# Patient Record
Sex: Male | Born: 1980 | Race: White | Hispanic: No | Marital: Single | Smoking: Former smoker
Health system: Southern US, Community
[De-identification: ages and names within clinical notes are randomized; demographics above are authoritative.]

## PROBLEM LIST (undated history)

## (undated) DIAGNOSIS — I4891 Unspecified atrial fibrillation: Secondary | ICD-10-CM

## (undated) DIAGNOSIS — M5126 Other intervertebral disc displacement, lumbar region: Secondary | ICD-10-CM

## (undated) HISTORY — DX: Unspecified atrial fibrillation: I48.91

## (undated) HISTORY — PX: AORTIC/RENAL BYPASS: SHX6299

---

## 2003-07-31 ENCOUNTER — Emergency Department (HOSPITAL_COMMUNITY): Admission: EM | Admit: 2003-07-31 | Discharge: 2003-07-31 | Payer: Self-pay | Admitting: *Deleted

## 2007-05-14 ENCOUNTER — Ambulatory Visit (HOSPITAL_COMMUNITY): Admission: RE | Admit: 2007-05-14 | Discharge: 2007-05-14 | Payer: Self-pay | Admitting: Family Medicine

## 2012-03-09 ENCOUNTER — Other Ambulatory Visit (HOSPITAL_COMMUNITY): Payer: Self-pay | Admitting: Physician Assistant

## 2012-03-09 DIAGNOSIS — L03221 Cellulitis of neck: Secondary | ICD-10-CM

## 2012-03-09 DIAGNOSIS — R6889 Other general symptoms and signs: Secondary | ICD-10-CM

## 2012-03-10 ENCOUNTER — Emergency Department (HOSPITAL_COMMUNITY): Payer: Self-pay

## 2012-03-10 ENCOUNTER — Emergency Department (HOSPITAL_COMMUNITY)
Admission: EM | Admit: 2012-03-10 | Discharge: 2012-03-10 | Disposition: A | Discharge status: Home / Self Care | Payer: Self-pay | Attending: Emergency Medicine | Admitting: Emergency Medicine

## 2012-03-10 ENCOUNTER — Other Ambulatory Visit (HOSPITAL_COMMUNITY): Payer: Self-pay | Admitting: Physician Assistant

## 2012-03-10 ENCOUNTER — Encounter (HOSPITAL_COMMUNITY): Payer: Self-pay

## 2012-03-10 ENCOUNTER — Ambulatory Visit (HOSPITAL_COMMUNITY): Payer: Self-pay

## 2012-03-10 DIAGNOSIS — R61 Generalized hyperhidrosis: Secondary | ICD-10-CM | POA: Insufficient documentation

## 2012-03-10 DIAGNOSIS — F17200 Nicotine dependence, unspecified, uncomplicated: Secondary | ICD-10-CM | POA: Insufficient documentation

## 2012-03-10 DIAGNOSIS — R6889 Other general symptoms and signs: Secondary | ICD-10-CM

## 2012-03-10 DIAGNOSIS — L0211 Cutaneous abscess of neck: Secondary | ICD-10-CM

## 2012-03-10 DIAGNOSIS — M542 Cervicalgia: Secondary | ICD-10-CM | POA: Insufficient documentation

## 2012-03-10 DIAGNOSIS — R590 Localized enlarged lymph nodes: Secondary | ICD-10-CM

## 2012-03-10 DIAGNOSIS — R599 Enlarged lymph nodes, unspecified: Secondary | ICD-10-CM | POA: Insufficient documentation

## 2012-03-10 LAB — BASIC METABOLIC PANEL
Calcium: 9.8 mg/dL (ref 8.4–10.5)
Creatinine, Ser: 0.84 mg/dL (ref 0.50–1.35)
GFR calc non Af Amer: >90 mL/min (ref >90)
Glucose, Bld: 88 mg/dL (ref 70–99)
Sodium: 139 mEq/L (ref 135–145)

## 2012-03-10 LAB — CBC WITH DIFFERENTIAL/PLATELET
Basophils Absolute: 0 10*3/uL (ref 0.0–0.1)
Eosinophils Absolute: 0.2 10*3/uL (ref 0.0–0.7)
Eosinophils Relative: 1 % (ref 0–5)
HCT: 46.5 % (ref 39.0–52.0)
MCH: 30.9 pg (ref 26.0–34.0)
MCV: 89.8 fL (ref 78.0–100.0)
Monocytes Absolute: 1.5 10*3/uL — ABNORMAL HIGH (ref 0.1–1.0)
Platelets: 263 10*3/uL (ref 150–400)
RDW: 12.8 % (ref 11.5–15.5)

## 2012-03-10 MED ORDER — OXYCODONE-ACETAMINOPHEN 5-325 MG PO TABS: 1.0000 | ORAL_TABLET | ORAL | Status: AC | PRN

## 2012-03-10 MED ORDER — PREDNISONE 50 MG PO TABS
60.0000 mg | ORAL_TABLET | Freq: Once | ORAL | Status: AC
  Administered 2012-03-10: 60 mg via ORAL

## 2012-03-10 MED ORDER — PREDNISONE 10 MG PO TABS
ORAL_TABLET | ORAL | Status: AC
  Filled 2012-03-10: qty 1

## 2012-03-10 MED ORDER — PREDNISONE 20 MG PO TABS: 60.0000 mg | ORAL_TABLET | Freq: Every day | ORAL | Status: DC

## 2012-03-10 MED ORDER — IOHEXOL 300 MG/ML  SOLN
75.0000 mL | Freq: Once | INTRAMUSCULAR | Status: AC | PRN
  Administered 2012-03-10: 75 mL via INTRAVENOUS

## 2012-03-10 MED ORDER — HYDROMORPHONE HCL PF 1 MG/ML IJ SOLN
1.0000 mg | Freq: Once | INTRAMUSCULAR | Status: AC
  Administered 2012-03-10: 1 mg via INTRAVENOUS
  Filled 2012-03-10: qty 1

## 2012-03-10 MED ORDER — ONDANSETRON HCL 4 MG/2ML IJ SOLN
4.0000 mg | Freq: Once | INTRAMUSCULAR | Status: AC
  Administered 2012-03-10: 4 mg via INTRAVENOUS
  Filled 2012-03-10: qty 2

## 2012-03-10 MED ORDER — PREDNISONE 50 MG PO TABS
ORAL_TABLET | ORAL | Status: AC
  Filled 2012-03-10: qty 1

## 2012-03-10 NOTE — ED Provider Notes (Signed)
History   This chart was scribed for Delora Fuel, MD, by Joline Maxcy, ER scribe. The patient was seen in room APFT22/APFT22 and the patient's care was started at 1756.    CSN: [id number]  Arrival date & time 03/10/12  1739   First MD Initiated Contact with Patient 03/10/12 1756      Chief Complaint  Patient presents with  . Facial Pain    (Consider location/radiation/quality/duration/timing/severity/associated sxs/prior treatment) HPI Comments: Alex [name] is a 31 y.o. male who presents to the Emergency Department complaining of gradually worsening swelling to the left neck with associated 8/10 pain and diaphoresis that began last week. He denies any chills or fevers. He states that the pain is aggravated by movement and moving his jaw. He is a current, every day, pack a day smoker who does not drink ETOH. His mother reports that he was experiencing a sore throat and difficulty swallowing symptoms last week, but they have since resolved. He states that he saw his PCP and was prescribed doxycyline and Augmentin. He states that he has been taking oxycodone 5 mg at home with minimal relief.    PCP is Dr. Gerarda Fraction.      History reviewed. No pertinent past medical history.  History reviewed. No pertinent past surgical history.  No family history on file.  History  Substance Use Topics  . Smoking status: Current Every Day Smoker  . Smokeless tobacco: Not on file  . Alcohol Use: No      Review of Systems  HENT: Positive for facial swelling and neck pain.   All other systems reviewed and are negative.    Allergies  Review of patient's allergies indicates no known allergies.  Home Medications   Current Outpatient Rx  Name  Route  Sig  Dispense  Refill  . AMOXICILLIN-POT CLAVULANATE 875-125 MG PO TABS   Oral   Take 1 tablet by mouth 2 (two) times daily.         Marland Kitchen DOXYCYCLINE HYCLATE 100 MG PO TBEC   Oral   Take 100 mg by mouth 2 (two) times daily.          . OXYCODONE-ACETAMINOPHEN 5-325 MG PO TABS   Oral   Take 1 tablet by mouth every 6 (six) hours as needed. For pain         . PHENYLEPHRINE-DM-GG 5-10-100 MG/5ML PO LIQD   Oral   Take by mouth daily as needed. For cough           BP 143/85  Pulse 96  Temp 98 F (36.7 C) (Oral)  Resp 21  Ht 6\' 3"  (1.905 m)  Wt 299 lb 4 oz (135.739 kg)  BMI 37.40 kg/m2  SpO2 100%  Physical Exam  Nursing note and vitals reviewed. Constitutional: He is oriented to person, place, and time. He appears well-developed and well-nourished. No distress.  HENT:  Head: Normocephalic and atraumatic.  Eyes: EOM are normal. Pupils are equal, round, and reactive to light.  Neck: Normal range of motion. Neck supple. No tracheal deviation present.       He has an indurated tender mass occupying the post-triangle that extends to the post-auricular area and measures 8 by 9 cm.  Cardiovascular: Normal rate.   Pulmonary/Chest: Effort normal. No respiratory distress.  Abdominal: Soft. He exhibits no distension.  Musculoskeletal: Normal range of motion. He exhibits no edema.  Neurological: He is alert and oriented to person, place, and time.  Skin: Skin is warm and dry.  Psychiatric: He has a normal mood and affect. His behavior is normal.    ED Course  Procedures (including critical care time)  DIAGNOSTIC STUDIES: Oxygen Saturation is 100% on room air, normal by my interpretation.    COORDINATION OF CARE:  18:03- Discussed planned course of treatment with the patient, including a neck CT and blood work, who is agreeable at this time.  18:15- Medication Orders- hydromorphone (Dilaudid) injection 1 mg- Once, ondansetron (Zofran) injection 4 mg- Once.  19:14- Medication Orders- iohexol (Omnipaque) 300 mg/ml solution 75 mL- Once.  20:32- Recheck- Discussed scan results. Shared that the antibiotics are the right medications to the treat the condition as well as a course of steroids. Will give Dr. Verlan Friends  information to follow up with ENT.   Results for orders placed during the hospital encounter of 03/10/12  CBC WITH DIFFERENTIAL      Component Value Range   WBC 16.8 (*) 4.0 - 10.5 K/uL   RBC 5.18  4.22 - 5.81 MIL/uL   Hemoglobin 16.0  13.0 - 17.0 g/dL   HCT 46.5  39.0 - 52.0 %   MCV 89.8  78.0 - 100.0 fL   MCH 30.9  26.0 - 34.0 pg   MCHC 34.4  30.0 - 36.0 g/dL   RDW 12.8  11.5 - 15.5 %   Platelets 263  150 - 400 K/uL   Neutrophils Relative 64  43 - 77 %   Neutro Abs 10.8 (*) 1.7 - 7.7 K/uL   Lymphocytes Relative 25  12 - 46 %   Lymphs Abs 4.3 (*) 0.7 - 4.0 K/uL   Monocytes Relative 9  3 - 12 %   Monocytes Absolute 1.5 (*) 0.1 - 1.0 K/uL   Eosinophils Relative 1  0 - 5 %   Eosinophils Absolute 0.2  0.0 - 0.7 K/uL   Basophils Relative 0  0 - 1 %   Basophils Absolute 0.0  0.0 - 0.1 K/uL  BASIC METABOLIC PANEL      Component Value Range   Sodium 139  135 - 145 mEq/L   Potassium 4.1  3.5 - 5.1 mEq/L   Chloride 98  96 - 112 mEq/L   CO2 32  19 - 32 mEq/L   Glucose, Bld 88  70 - 99 mg/dL   BUN 10  6 - 23 mg/dL   Creatinine, Ser 0.84  0.50 - 1.35 mg/dL   Calcium 9.8  8.4 - 10.5 mg/dL   GFR calc non Af Amer >90  >90 mL/min   GFR calc Af Amer >90  >90 mL/min   Ct Soft Tissue Neck W Contrast  03/10/2012  *RADIOLOGY REPORT*  Clinical Data: Left neck swelling/pain  CT NECK WITH CONTRAST  Technique:  Multidetector CT imaging of the neck was performed with intravenous contrast.  Contrast: 68mL OMNIPAQUE IOHEXOL 300 MG/ML  SOLN  Comparison: None.  Findings: 1.8 x 1.4 cm centrally necrotic left level IIB node with surrounding inflammatory stranding (series 2/image 33).  Additional bilateral anterior cervical nodes including a 13 mm short-axis left level IIA node (series 2/image 44).  Fullness of the left tonsillar pillar relative to the right (series 2/image 34), without underlying drainable fluid collection/abscess.  The pharynx remains patent.  The visualized paranasal sinuses are  essentially clear. The mastoid air cells are unopacified.  Visualized brain parenchyma is unremarkable.  The cervical spine is normal.  IMPRESSION: Fullness of the left tonsillar pillar.  Correlate with direct inspection.  No evidence of peritonsillar abscess.  Associated left cervical lymphadenopathy including a 1.8 x 1.4 cm centrally necrotic left level IIB node.  Given the patient's age, these findings are favored to be infectious/inflammatory, although neoplasm is not excluded. Clinical follow-up to resolution is suggested.   Original Report Authenticated By: Julian Hy, M.D.       1. Cervical lymphadenopathy       MDM   Neck swelling worrisome for abscess. CT will be obtained to characterize the lesion.  CT shows evidence of enlarged lymph nodes with a necrotic lymph node-presumably inflammatory. He has just started on antibiotics and should continue taking them. He is given a prescription for prednisone and Percocet and he is referred to ENT for followup. Importance of followup was stressed to patient and his mother who both expressed understanding.   I personally performed the services described in this documentation, which was scribed in my presence. The recorded information has been reviewed and is accurate.           Delora Fuel, MD [id number]

## 2012-03-10 NOTE — ED Notes (Signed)
Complain of pain in swelling to left side of neck

## 2012-03-10 NOTE — ED Notes (Signed)
Patient with no complaints at this time. Respirations even and unlabored. Skin warm/dry. Discharge instructions reviewed with patient at this time. Patient given opportunity to voice concerns/ask questions. IV removed per policy and band-aid applied to site. Patient discharged at this time and left Emergency Department with steady gait.  

## 2012-03-11 ENCOUNTER — Ambulatory Visit (HOSPITAL_COMMUNITY): Admission: RE | Admit: 2012-03-11 | Payer: Self-pay | Source: Ambulatory Visit

## 2012-03-19 ENCOUNTER — Other Ambulatory Visit (HOSPITAL_COMMUNITY): Payer: Self-pay

## 2012-09-09 ENCOUNTER — Encounter (HOSPITAL_COMMUNITY): Payer: Self-pay | Admitting: *Deleted

## 2012-09-09 ENCOUNTER — Emergency Department (HOSPITAL_COMMUNITY)
Admission: EM | Admit: 2012-09-09 | Discharge: 2012-09-09 | Disposition: A | Discharge status: Home / Self Care | Payer: Self-pay | Attending: Emergency Medicine | Admitting: Emergency Medicine

## 2012-09-09 DIAGNOSIS — K089 Disorder of teeth and supporting structures, unspecified: Secondary | ICD-10-CM | POA: Insufficient documentation

## 2012-09-09 DIAGNOSIS — F17200 Nicotine dependence, unspecified, uncomplicated: Secondary | ICD-10-CM | POA: Insufficient documentation

## 2012-09-09 DIAGNOSIS — K0889 Other specified disorders of teeth and supporting structures: Secondary | ICD-10-CM

## 2012-09-09 DIAGNOSIS — R220 Localized swelling, mass and lump, head: Secondary | ICD-10-CM | POA: Insufficient documentation

## 2012-09-09 MED ORDER — HYDROCODONE-ACETAMINOPHEN 5-325 MG PO TABS: 2.0000 | ORAL_TABLET | ORAL | Status: DC | PRN

## 2012-09-09 MED ORDER — PENICILLIN V POTASSIUM 500 MG PO TABS: 500.0000 mg | ORAL_TABLET | Freq: Four times a day (QID) | ORAL | Status: AC

## 2012-09-09 NOTE — ED Notes (Signed)
Pt alert & oriented x4, stable gait. Patient given discharge instructions, paperwork & prescription(s). Patient  instructed to stop at the registration desk to finish any additional paperwork. Patient verbalized understanding. Pt left department w/ no further questions. 

## 2012-09-09 NOTE — ED Provider Notes (Signed)
Medical screening examination/treatment/procedure(s) were performed by non-physician practitioner and as supervising physician I was immediately available for consultation/collaboration.   Ezequiel Essex, MD 09/09/12 2340

## 2012-09-09 NOTE — ED Notes (Signed)
Dental pain for 2 days , says he broke 2 molars

## 2012-09-09 NOTE — ED Provider Notes (Signed)
History     CSN: [id number]  Arrival date & time 09/09/12  1800   First MD Initiated Contact with Patient 09/09/12 1844      Chief Complaint  Patient presents with  . Dental Pain    (Consider location/radiation/quality/duration/timing/severity/associated sxs/prior treatment) Patient is a 32 y.o. male presenting with tooth pain. The history is provided by the patient. No language interpreter was used.  Dental Pain Location:  Lower Lower teeth location:  18/LL 2nd molar and 19/LL 1st molar Quality:  Shooting Severity:  Moderate Timing:  Constant Progression:  Worsening Chronicity:  New Relieved by:  Nothing Worsened by:  Nothing tried Associated symptoms: gum swelling     History reviewed. No pertinent past medical history.  History reviewed. No pertinent past surgical history.  History reviewed. No pertinent family history.  History  Substance Use Topics  . Smoking status: Current Every Day Smoker  . Smokeless tobacco: Not on file  . Alcohol Use: No      Review of Systems  HENT: Positive for dental problem.   All other systems reviewed and are negative.    Allergies  Review of patient's allergies indicates no known allergies.  Home Medications  No current outpatient prescriptions on file.  BP 147/98  Pulse 79  Temp(Src) 98.5 F (36.9 C) (Oral)  Resp 18  Ht 6\' 3"  (1.905 m)  Wt 300 lb (136.079 kg)  BMI 37.5 kg/m2  SpO2 100%  Physical Exam  Nursing note and vitals reviewed. Constitutional: He appears well-developed and well-nourished.  HENT:  Head: Normocephalic and atraumatic.  Mouth/Throat: Oropharynx is clear and moist.  Swollen gum line,  Decayed brown tooth  Eyes: Pupils are equal, round, and reactive to light.  Neck: Normal range of motion.  Cardiovascular: Normal rate.   Pulmonary/Chest: Effort normal.  Musculoskeletal: Normal range of motion.  Neurological: He is alert.  Skin: Skin is warm.    ED Course  Procedures (including  critical care time)  Labs Reviewed - No data to display No results found.   1. Toothache       MDM  pcn and hydrocodone         [place], PA-C 09/09/12 [place], PA-C 09/09/12 1901

## 2012-10-12 ENCOUNTER — Encounter (HOSPITAL_COMMUNITY): Payer: Self-pay | Admitting: *Deleted

## 2012-10-12 ENCOUNTER — Emergency Department (HOSPITAL_COMMUNITY)
Admission: EM | Admit: 2012-10-12 | Discharge: 2012-10-12 | Disposition: A | Discharge status: Home / Self Care | Payer: Self-pay | Attending: Emergency Medicine | Admitting: Emergency Medicine

## 2012-10-12 DIAGNOSIS — K089 Disorder of teeth and supporting structures, unspecified: Secondary | ICD-10-CM | POA: Insufficient documentation

## 2012-10-12 DIAGNOSIS — K1370 Unspecified lesions of oral mucosa: Secondary | ICD-10-CM | POA: Insufficient documentation

## 2012-10-12 DIAGNOSIS — K0889 Other specified disorders of teeth and supporting structures: Secondary | ICD-10-CM

## 2012-10-12 DIAGNOSIS — F17200 Nicotine dependence, unspecified, uncomplicated: Secondary | ICD-10-CM | POA: Insufficient documentation

## 2012-10-12 DIAGNOSIS — R51 Headache: Secondary | ICD-10-CM | POA: Insufficient documentation

## 2012-10-12 MED ORDER — AMOXICILLIN 500 MG PO CAPS: 500.0000 mg | ORAL_CAPSULE | Freq: Three times a day (TID) | ORAL | Status: DC

## 2012-10-12 MED ORDER — TRAMADOL HCL 50 MG PO TABS: 50.0000 mg | ORAL_TABLET | Freq: Four times a day (QID) | ORAL | Status: DC | PRN

## 2012-10-12 NOTE — ED Notes (Signed)
Mult dental caries. Pain both sides of face.

## 2012-10-12 NOTE — ED Provider Notes (Signed)
History    CSN: [id number] Arrival date & time 10/12/12  [id number]  First MD Initiated Contact with Patient 10/12/12 1913     Chief Complaint  Patient presents with  . Dental Problem   (Consider location/radiation/quality/duration/timing/severity/associated sxs/prior Treatment) Patient is a 32 y.o. male presenting with tooth pain. The history is provided by the patient.  Dental Pain Location:  Upper and lower Upper teeth location:  15/LU 2nd molar Lower teeth location:  32/RL 3rd molar Quality:  Sharp and throbbing Severity:  Moderate Onset quality:  Gradual Timing:  Constant Progression:  Worsening Chronicity:  Recurrent Context: dental caries and poor dentition   Context: not abscess, not malocclusion and not trauma   Prior workup: none. Relieved by:  Nothing Worsened by:  Cold food/drink, hot food/drink and touching Ineffective treatments:  NSAIDs and acetaminophen Associated symptoms: facial pain and oral bleeding   Associated symptoms: no congestion, no difficulty swallowing, no drooling, no facial swelling, no fever, no gum swelling, no headaches, no neck pain, no neck swelling, no oral lesions and no trismus   Associated symptoms comment:  Intermittent bleeding of the right lower gums with brushing Risk factors: lack of dental care, periodontal disease and smoking    History reviewed. No pertinent past medical history. History reviewed. No pertinent past surgical history. No family history on file. History  Substance Use Topics  . Smoking status: Current Every Day Smoker  . Smokeless tobacco: Not on file  . Alcohol Use: No    Review of Systems  Constitutional: Negative for fever and appetite change.  HENT: Positive for dental problem. Negative for congestion, sore throat, facial swelling, drooling, mouth sores, trouble swallowing, neck pain and neck stiffness.   Eyes: Negative for pain and visual disturbance.  Neurological: Negative for dizziness, facial asymmetry  and headaches.  Hematological: Negative for adenopathy.  All other systems reviewed and are negative.    Allergies  Review of patient's allergies indicates no known allergies.  Home Medications   Current Outpatient Rx  Name  Route  Sig  Dispense  Refill  . HYDROcodone-acetaminophen (NORCO/VICODIN) 5-325 MG per tablet   Oral   Take 2 tablets by mouth every 4 (four) hours as needed for pain.   20 tablet   0    BP 135/82  Pulse 81  Temp(Src) 98.6 F (37 C) (Oral)  Resp 20  Ht 6\' 3"  (1.905 m)  Wt 300 lb (136.079 kg)  BMI 37.5 kg/m2  SpO2 100% Physical Exam  Nursing note and vitals reviewed. Constitutional: He is oriented to person, place, and time. He appears well-developed and well-nourished. No distress.  HENT:  Head: Normocephalic and atraumatic. No trismus in the jaw.  Right Ear: Tympanic membrane and ear canal normal.  Left Ear: Tympanic membrane and ear canal normal.  Mouth/Throat: Uvula is midline, oropharynx is clear and moist and mucous membranes are normal. Dental caries present. No dental abscesses or edematous.    ttp of the upper left second molar and right lower third molar.  No facial edema, trismus, or abscess  Neck: Normal range of motion. Neck supple.  Cardiovascular: Normal rate, regular rhythm and normal heart sounds.   No murmur heard. Pulmonary/Chest: Effort normal and breath sounds normal.  Musculoskeletal: Normal range of motion.  Lymphadenopathy:    He has no cervical adenopathy.  Neurological: He is alert and oriented to person, place, and time. He exhibits normal muscle tone. Coordination normal.  Skin: Skin is warm and dry.    ED  Course  Procedures (including critical care time) Labs Reviewed - No data to display   MDM     Dental pain with multiple dental caries.  No facial edema, trismus or obvious dental abscess.  Referral info given for Dr. Donn Pierini.    Avanish Cerullo L. Vanessa Mantua, PA-C 10/12/12 1944

## 2012-10-12 NOTE — ED Provider Notes (Signed)
Medical screening examination/treatment/procedure(s) were performed by non-physician practitioner and as supervising physician I was immediately available for consultation/collaboration.   Hoy Morn, MD 10/12/12 1949

## 2012-10-12 NOTE — ED Notes (Signed)
Pt reprots having some chipped teeth, has tried to see dentist but has no insurance & was unable to be seen.

## 2013-03-14 ENCOUNTER — Encounter (HOSPITAL_COMMUNITY): Payer: Self-pay | Admitting: Emergency Medicine

## 2013-03-14 ENCOUNTER — Emergency Department (HOSPITAL_COMMUNITY)
Admission: EM | Admit: 2013-03-14 | Discharge: 2013-03-14 | Disposition: A | Discharge status: Home / Self Care | Payer: BC Managed Care – PPO | Attending: Emergency Medicine | Admitting: Emergency Medicine

## 2013-03-14 DIAGNOSIS — F17200 Nicotine dependence, unspecified, uncomplicated: Secondary | ICD-10-CM | POA: Insufficient documentation

## 2013-03-14 DIAGNOSIS — M5432 Sciatica, left side: Secondary | ICD-10-CM

## 2013-03-14 DIAGNOSIS — Z8781 Personal history of (healed) traumatic fracture: Secondary | ICD-10-CM | POA: Insufficient documentation

## 2013-03-14 DIAGNOSIS — M5430 Sciatica, unspecified side: Secondary | ICD-10-CM | POA: Insufficient documentation

## 2013-03-14 MED ORDER — OXYCODONE-ACETAMINOPHEN 5-325 MG PO TABS
1.0000 | ORAL_TABLET | Freq: Once | ORAL | Status: AC
  Administered 2013-03-14: 1 via ORAL
  Filled 2013-03-14: qty 1

## 2013-03-14 MED ORDER — CYCLOBENZAPRINE HCL 10 MG PO TABS: 10.0000 mg | ORAL_TABLET | Freq: Three times a day (TID) | ORAL | Status: DC

## 2013-03-14 MED ORDER — PREDNISONE 50 MG PO TABS
60.0000 mg | ORAL_TABLET | Freq: Once | ORAL | Status: AC
  Administered 2013-03-14: 60 mg via ORAL
  Filled 2013-03-14 (×2): qty 1

## 2013-03-14 MED ORDER — OXYCODONE-ACETAMINOPHEN 5-325 MG PO TABS: 1.0000 | ORAL_TABLET | ORAL | Status: DC | PRN

## 2013-03-14 MED ORDER — PREDNISONE 10 MG PO TABS: ORAL_TABLET | ORAL | Status: DC

## 2013-03-14 MED ORDER — CYCLOBENZAPRINE HCL 10 MG PO TABS
10.0000 mg | ORAL_TABLET | Freq: Once | ORAL | Status: AC
  Administered 2013-03-14: 10 mg via ORAL
  Filled 2013-03-14: qty 1

## 2013-03-14 NOTE — ED Notes (Signed)
Pt c/o lower back pain radiating down left leg.
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
## 2013-03-22 NOTE — ED Provider Notes (Signed)
Medical screening examination/treatment/procedure(s) were performed by non-physician practitioner and as supervising physician I was immediately available for consultation/collaboration.  EKG Interpretation   None         Mervin Kung, MD 03/22/13 1019

## 2013-08-11 ENCOUNTER — Encounter (HOSPITAL_COMMUNITY): Payer: Self-pay | Admitting: Emergency Medicine

## 2013-08-11 ENCOUNTER — Emergency Department (HOSPITAL_COMMUNITY)
Admission: EM | Admit: 2013-08-11 | Discharge: 2013-08-11 | Disposition: A | Discharge status: Home / Self Care | Payer: BC Managed Care – PPO | Attending: Emergency Medicine | Admitting: Emergency Medicine

## 2013-08-11 DIAGNOSIS — M5430 Sciatica, unspecified side: Secondary | ICD-10-CM | POA: Insufficient documentation

## 2013-08-11 DIAGNOSIS — F17200 Nicotine dependence, unspecified, uncomplicated: Secondary | ICD-10-CM | POA: Insufficient documentation

## 2013-08-11 DIAGNOSIS — M5432 Sciatica, left side: Secondary | ICD-10-CM

## 2013-08-11 DIAGNOSIS — IMO0002 Reserved for concepts with insufficient information to code with codable children: Secondary | ICD-10-CM | POA: Insufficient documentation

## 2013-08-11 DIAGNOSIS — Z79899 Other long term (current) drug therapy: Secondary | ICD-10-CM | POA: Insufficient documentation

## 2013-08-11 MED ORDER — CYCLOBENZAPRINE HCL 10 MG PO TABS: 10.0000 mg | ORAL_TABLET | Freq: Two times a day (BID) | ORAL | Status: DC | PRN

## 2013-08-11 MED ORDER — PREDNISONE 10 MG PO TABS: 20.0000 mg | ORAL_TABLET | Freq: Two times a day (BID) | ORAL | Status: DC

## 2013-08-11 MED ORDER — HYDROCODONE-ACETAMINOPHEN 5-325 MG PO TABS: 1.0000 | ORAL_TABLET | ORAL | Status: DC | PRN

## 2013-08-11 NOTE — ED Notes (Signed)
Pt states he was bending down yesterday at work & started having lower back pain that goes down the left leg.

## 2013-08-11 NOTE — ED Notes (Signed)
Pt alert & oriented x4, stable gait. Patient given discharge instructions, paperwork & prescription(s). Patient  instructed to stop at the registration desk to finish any additional paperwork. Patient verbalized understanding. Pt left department w/ no further questions. 

## 2013-08-11 NOTE — ED Notes (Signed)
Lower left back pain and pain in my left leg per pt.

## 2013-08-11 NOTE — ED Provider Notes (Signed)
CSN: [id number]     Arrival date & time 08/11/13  2026 History   First MD Initiated Contact with Patient 08/11/13 2046     Chief Complaint  Patient presents with  . Back Pain     (Consider location/radiation/quality/duration/timing/severity/associated sxs/prior Treatment) Patient is a 33 y.o. male presenting with back pain. The history is provided by the patient.  Back Pain Location:  Lumbar spine Quality:  Shooting Radiates to:  L posterior upper leg Pain severity:  Moderate Pain is:  Same all the time Onset quality:  Sudden Duration:  1 day Timing:  Constant Progression:  Unchanged Chronicity:  New Context: physical stress   Relieved by:  Nothing Worsened by:  Ambulation, movement and standing Ineffective treatments:  OTC medications Associated symptoms: no bladder incontinence, no bowel incontinence and no dysuria    Alex [name] is a 33 y.o. male who presents to the ED with back pain. 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   No past medical history on file. History reviewed. No pertinent past surgical history. History reviewed. No pertinent family history. History  Substance Use Topics  . Smoking status: Current Every Day Smoker  . Smokeless tobacco: Not on file  . Alcohol Use: No    Review of Systems  Gastrointestinal: Negative for bowel incontinence.  Genitourinary: Negative for bladder incontinence and dysuria.  Musculoskeletal: Positive for back pain.  All other systems negative    Allergies  Review of patient's allergies indicates no known allergies.  Home Medications   Prior to Admission medications   Medication Sig Start Date End Date Taking? Authorizing Provider  cyclobenzaprine (FLEXERIL) 10 MG tablet Take 1 tablet (10 mg total) by mouth 3 (three) times daily. 03/14/13   Evalee Jefferson, PA-C  oxyCODONE-acetaminophen  (PERCOCET/ROXICET) 5-325 MG per tablet Take 1 tablet by mouth every 4 (four) hours as needed for severe pain. 03/14/13   Evalee Jefferson, PA-C  predniSONE (DELTASONE) 10 MG tablet 6, 5, 4, 3, 2 then 1 tablet by mouth daily for 6 days total. 03/14/13   Evalee Jefferson, PA-C   BP 125/81  Pulse 81  Temp(Src) 98.3 F (36.8 C) (Oral)  Resp 20  Ht 6' 3.5" (1.918 m)  Wt 250 lb (113.399 kg)  BMI 30.83 kg/m2  SpO2 98% Physical Exam  Nursing note and vitals reviewed. Constitutional: He is oriented to person, place, and time. He appears well-developed and well-nourished. No distress.  HENT:  Head: Normocephalic and atraumatic.  Eyes: EOM are normal. Pupils are equal, round, and reactive to light.  Neck: Normal range of motion. Neck supple.  Cardiovascular: Normal rate and regular rhythm.   Pulmonary/Chest: Effort normal. No respiratory distress. He has no wheezes. He has no rales.  Abdominal: Soft. Bowel sounds are normal. There is no tenderness.  Musculoskeletal: Normal range of motion. He exhibits no edema.       Lumbar back: He exhibits tenderness and spasm. He exhibits normal range of motion, no deformity and normal pulse.       Back:  Pedal pulses equal, 2+, adequate circulation, good touch sensation.  Neurological: He is alert and oriented to person, place, and time. He has normal strength. No cranial nerve deficit or sensory deficit. Coordination and gait normal.  Reflex Scores:      Bicep reflexes are 2+ on the right side and 2+ on the  left side.      Brachioradialis reflexes are 2+ on the right side and 2+ on the left side.      Patellar reflexes are 2+ on the right side and 2+ on the left side.      Achilles reflexes are 2+ on the right side and 2+ on the left side. Skin: Skin is warm and dry.  Psychiatric: He has a normal mood and affect. His behavior is normal.    ED Course  Procedures  MDM  33 y.o. male with low back pain s/p strain while at work yesterday. He has an appointment to  follow up with his orthopedic doctor next week. Will treat for pain and inflammation. Discussed with the patient and all questioned fully answered. He will return if any problems arise. Stable for discharge without neruo deficits.    Medication List    TAKE these medications       HYDROcodone-acetaminophen 5-325 MG per tablet  Commonly known as:  NORCO/VICODIN  Take 1 tablet by mouth every 4 (four) hours as needed.      ASK your doctor about these medications       cyclobenzaprine 10 MG tablet  Commonly known as:  FLEXERIL  Take 1 tablet (10 mg total) by mouth 3 (three) times daily.  Ask about: Which instructions should I use?     cyclobenzaprine 10 MG tablet  Commonly known as:  FLEXERIL  Take 1 tablet (10 mg total) by mouth 2 (two) times daily as needed for muscle spasms.  Ask about: Which instructions should I use?     oxyCODONE-acetaminophen 5-325 MG per tablet  Commonly known as:  PERCOCET/ROXICET  Take 1 tablet by mouth every 4 (four) hours as needed for severe pain.     predniSONE 10 MG tablet  Commonly known as:  DELTASONE  6, 5, 4, 3, 2 then 1 tablet by mouth daily for 6 days total.  Ask about: Which instructions should I use?     predniSONE 10 MG tablet  Commonly known as:  DELTASONE  Take 2 tablets (20 mg total) by mouth 2 (two) times daily with a meal.  Ask about: Which instructions should I use?          [place], [place] 08/12/13 [phone]

## 2013-08-12 NOTE — ED Provider Notes (Signed)
Medical screening examination/treatment/procedure(s) were performed by non-physician practitioner and as supervising physician I was immediately available for consultation/collaboration.   EKG Interpretation None        Ezequiel Essex, MD 08/12/13 [phone]

## 2014-02-11 ENCOUNTER — Emergency Department (HOSPITAL_COMMUNITY)
Admission: EM | Admit: 2014-02-11 | Discharge: 2014-02-11 | Disposition: A | Discharge status: Home / Self Care | Payer: BC Managed Care – PPO | Attending: Emergency Medicine | Admitting: Emergency Medicine

## 2014-02-11 ENCOUNTER — Encounter (HOSPITAL_COMMUNITY): Payer: Self-pay | Admitting: *Deleted

## 2014-02-11 ENCOUNTER — Emergency Department (HOSPITAL_COMMUNITY): Payer: BC Managed Care – PPO

## 2014-02-11 DIAGNOSIS — M25472 Effusion, left ankle: Secondary | ICD-10-CM

## 2014-02-11 DIAGNOSIS — R000 Tachycardia, unspecified: Secondary | ICD-10-CM | POA: Insufficient documentation

## 2014-02-11 DIAGNOSIS — Z7952 Long term (current) use of systemic steroids: Secondary | ICD-10-CM | POA: Insufficient documentation

## 2014-02-11 DIAGNOSIS — R2242 Localized swelling, mass and lump, left lower limb: Secondary | ICD-10-CM | POA: Insufficient documentation

## 2014-02-11 DIAGNOSIS — M10071 Idiopathic gout, right ankle and foot: Secondary | ICD-10-CM | POA: Insufficient documentation

## 2014-02-11 DIAGNOSIS — Z79899 Other long term (current) drug therapy: Secondary | ICD-10-CM | POA: Insufficient documentation

## 2014-02-11 DIAGNOSIS — M25572 Pain in left ankle and joints of left foot: Secondary | ICD-10-CM

## 2014-02-11 DIAGNOSIS — Z720 Tobacco use: Secondary | ICD-10-CM | POA: Insufficient documentation

## 2014-02-11 HISTORY — DX: Other intervertebral disc displacement, lumbar region: M51.26

## 2014-02-11 MED ORDER — KETOROLAC TROMETHAMINE 60 MG/2ML IM SOLN
60.0000 mg | Freq: Once | INTRAMUSCULAR | Status: AC
  Administered 2014-02-11: 60 mg via INTRAMUSCULAR
  Filled 2014-02-11: qty 2

## 2014-02-11 MED ORDER — NAPROXEN 500 MG PO TABS: 500.0000 mg | ORAL_TABLET | Freq: Two times a day (BID) | ORAL | Status: DC

## 2014-02-11 MED ORDER — PREDNISONE 10 MG PO TABS: 20.0000 mg | ORAL_TABLET | Freq: Two times a day (BID) | ORAL | Status: DC

## 2014-02-11 MED ORDER — HYDROCODONE-ACETAMINOPHEN 5-325 MG PO TABS
1.0000 | ORAL_TABLET | Freq: Once | ORAL | Status: AC
  Administered 2014-02-11: 1 via ORAL
  Filled 2014-02-11: qty 1

## 2014-02-11 MED ORDER — DEXAMETHASONE SODIUM PHOSPHATE 4 MG/ML IJ SOLN
10.0000 mg | Freq: Once | INTRAMUSCULAR | Status: AC
  Administered 2014-02-11: 10 mg via INTRAMUSCULAR
  Filled 2014-02-11: qty 3

## 2014-02-11 NOTE — ED Notes (Signed)
Pt very sleepy, family states he did not sleep last night.

## 2014-02-11 NOTE — ED Notes (Signed)
Left foot and ankle swollen. Officer at the bedside to remove house arrest angle band, place on right ankle

## 2014-02-11 NOTE — ED Notes (Signed)
Patient given discharge instruction, verbalized understand. Patient ambulatory out of the department.  

## 2014-02-11 NOTE — ED Notes (Signed)
Pain, swelling of lt foot /and ankle  .  No known injury .  Pt has an ankle bracelet on lt leg. That is tight.  Pt is under house arrest.

## 2014-02-11 NOTE — ED Provider Notes (Signed)
CSN: [id number]     Arrival date & time 02/11/14  2033 History   First MD Initiated Contact with Patient 02/11/14 2045     Chief Complaint  Patient presents with  . Foot Pain     (Consider location/radiation/quality/duration/timing/severity/associated sxs/prior Treatment) Patient is a 33 y.o. male presenting with lower extremity pain. The history is provided by the patient.  Foot Pain This is a new problem. The current episode started yesterday. The problem occurs constantly. The problem has been gradually worsening. The symptoms are aggravated by walking and standing. He has tried acetaminophen for the symptoms. The treatment provided no relief.   DELTA ISING is a 33 y.o. male who presents to the ED with left foot and ankle pain. He has an ankle bracelet that he is wearing because he is under house arrest. The bracelet is tight and he is not sure if the swelling is due to that or not.   Past Medical History  Diagnosis Date  . Ruptured lumbar disc    History reviewed. No pertinent past surgical history. History reviewed. No pertinent family history. History  Substance Use Topics  . Smoking status: Current Every Day Smoker  . Smokeless tobacco: Not on file  . Alcohol Use: No    Review of Systems Negative except as stated in HPI   Allergies  Review of patient's allergies indicates no known allergies.  Home Medications   Prior to Admission medications   Medication Sig Start Date End Date Taking? Authorizing Provider  cyclobenzaprine (FLEXERIL) 10 MG tablet Take 1 tablet (10 mg total) by mouth 3 (three) times daily. 03/14/13   Evalee Jefferson, PA-C  cyclobenzaprine (FLEXERIL) 10 MG tablet Take 1 tablet (10 mg total) by mouth 2 (two) times daily as needed for muscle spasms. 08/11/13   Jesselyn Rask Bunnie Pion, NP  HYDROcodone-acetaminophen (NORCO/VICODIN) 5-325 MG per tablet Take 1 tablet by mouth every 4 (four) hours as needed. 08/11/13   Sherena Machorro Bunnie Pion, NP  oxyCODONE-acetaminophen  (PERCOCET/ROXICET) 5-325 MG per tablet Take 1 tablet by mouth every 4 (four) hours as needed for severe pain. 03/14/13   Evalee Jefferson, PA-C  predniSONE (DELTASONE) 10 MG tablet 6, 5, 4, 3, 2 then 1 tablet by mouth daily for 6 days total. 03/14/13   Evalee Jefferson, PA-C  predniSONE (DELTASONE) 10 MG tablet Take 2 tablets (20 mg total) by mouth 2 (two) times daily with a meal. 08/11/13   AmeLie Hollars Bunnie Pion, NP   BP 111/85 mmHg  Pulse 106  Temp(Src) 99.4 F (37.4 C) (Oral)  Resp 20  Ht 6\' 4"  (1.93 m)  Wt 265 lb (120.203 kg)  BMI 32.27 kg/m2  SpO2 100% Physical Exam  Constitutional: He is oriented to person, place, and time. He appears well-developed and well-nourished.  HENT:  Head: Normocephalic.  Eyes: EOM are normal.  Neck: Neck supple.  Cardiovascular: Tachycardia present.   Pulmonary/Chest: Effort normal.  Musculoskeletal:       Left ankle: He exhibits decreased range of motion (due to pain) and swelling. He exhibits no deformity, no laceration and normal pulse. Tenderness. Lateral malleolus and medial malleolus tenderness found. Achilles tendon normal.  Tenderness, swelling, increased warmth to the left foot and ankle. Pedal pulses equal, adequate circulation, good touch sensation.   Neurological: He is alert and oriented to person, place, and time. No cranial nerve deficit.  Skin: Skin is warm and dry.  Psychiatric: He has a normal mood and affect. His behavior is normal.  Nursing note and vitals  reviewed.   ED Course  Procedures  Dg Ankle Complete Left  02/11/2014   CLINICAL DATA:  Pain and swelling LEFT ankle for 1 day, no injury, initial encounter  EXAM: LEFT ANKLE COMPLETE - 3+ VIEW  COMPARISON:  None  FINDINGS: Diffuse soft tissue swelling at LEFT ankle and foot.  Osseous mineralization normal.  Joint spaces preserved.  No acute fracture, dislocation, or bone destruction.  IMPRESSION: Diffuse soft tissue swelling without acute osseous findings.   Electronically Signed   By: Lavonia Dana  M.D.   On: 02/11/2014 21:34   Dg Foot Complete Left  02/11/2014   CLINICAL DATA:  Pain and swelling of LEFT ankle for 1 day, no injury  EXAM: LEFT FOOT - COMPLETE 3+ VIEW  COMPARISON:  None  FINDINGS: Diffuse soft tissue swelling of LEFT foot.  Osseous mineralization normal.  Joint spaces preserved.  No acute fracture, dislocation or bone destruction.  IMPRESSION: No acute osseous abnormalities.   Electronically Signed   By: Lavonia Dana M.D.   On: 02/11/2014 21:33   Dr. Lacinda Axon in to examine the patient, will treat as gout with steroids and NSAIDS  MDM  33 y.o. male with left foot and ankle pain and swelling x 24 hours. I have reviewed this patient's vital signs, nurses notes, appropriate imaging and discussed findings and plan of care with the patient and his family. They voice understanding and agree with plan. They will return for worsening symptoms. Stable for discharge without neurovascular deficits.        [address] [place], NP 02/11/14 [place], MD 02/14/14 [phone]

## 2014-06-22 ENCOUNTER — Emergency Department (HOSPITAL_COMMUNITY)
Admission: EM | Admit: 2014-06-22 | Discharge: 2014-06-22 | Disposition: A | Discharge status: Home / Self Care | Payer: Self-pay | Attending: Emergency Medicine | Admitting: Emergency Medicine

## 2014-06-22 ENCOUNTER — Encounter (HOSPITAL_COMMUNITY): Payer: Self-pay

## 2014-06-22 DIAGNOSIS — Z791 Long term (current) use of non-steroidal anti-inflammatories (NSAID): Secondary | ICD-10-CM | POA: Insufficient documentation

## 2014-06-22 DIAGNOSIS — Z8739 Personal history of other diseases of the musculoskeletal system and connective tissue: Secondary | ICD-10-CM | POA: Insufficient documentation

## 2014-06-22 DIAGNOSIS — F1410 Cocaine abuse, uncomplicated: Secondary | ICD-10-CM | POA: Insufficient documentation

## 2014-06-22 DIAGNOSIS — Z720 Tobacco use: Secondary | ICD-10-CM | POA: Insufficient documentation

## 2014-06-22 DIAGNOSIS — Z7952 Long term (current) use of systemic steroids: Secondary | ICD-10-CM | POA: Insufficient documentation

## 2014-06-22 NOTE — ED Provider Notes (Signed)
CSN: [id number]     Arrival date & time 06/22/14  1021 History  This chart was scribed for Alex Fraise, MD by Einar Pheasant, Medical Scribe. This patient was seen in room APA15/APA15 and the patient's care was started at 10:46 AM.    Chief Complaint  Patient presents with  . V70.1   The history is provided by the patient and medical records. No language interpreter was used.   HPI Comments: Alex [name] is a 34 y.o. male with a h/o of IV drug use presents to the Emergency Department with IVC paperwork filled by his mother this morning. Pt admits to using IV cocaine regularly and he wrote a note to his mother detailing his illicit drug use. However, he states that the noted had no mention of SI or HI. Pt denies any SI, HI, intention of overdosing, visual hallucination, or auditory hallucinations. Pt has not plans on quitting. He denies being in any pain or distress.   Past Medical History  Diagnosis Date  . Ruptured lumbar disc    History reviewed. No pertinent past surgical history. No family history on file. History  Substance Use Topics  . Smoking status: Current Every Day Smoker  . Smokeless tobacco: Not on file  . Alcohol Use: No    Review of Systems  Constitutional: Negative for fever and chills.  Respiratory: Negative for cough and shortness of breath.   Gastrointestinal: Negative for nausea, vomiting and abdominal pain.  Psychiatric/Behavioral: Negative for suicidal ideas, hallucinations and self-injury.  All other systems reviewed and are negative.  Allergies  Review of patient's allergies indicates no known allergies.  Home Medications   Prior to Admission medications   Medication Sig Start Date End Date Taking? Authorizing Provider  naproxen (NAPROSYN) 500 MG tablet Take 1 tablet (500 mg total) by mouth 2 (two) times daily. Patient not taking: Reported on 06/22/2014 02/11/14   Ashley Murrain, NP  predniSONE (DELTASONE) 10 MG tablet Take 2 tablets (20 mg  total) by mouth 2 (two) times daily with a meal. Patient not taking: Reported on 06/22/2014 02/11/14   Ashley Murrain, NP   BP 140/93 mmHg  Pulse 66  Temp(Src) 98.3 F (36.8 C) (Oral)  Resp 18  Ht 6\' 4"  (1.93 m)  Wt 250 lb (113.399 kg)  BMI 30.44 kg/m2  SpO2 100%  Physical Exam  Nursing note and vitals reviewed. CONSTITUTIONAL: disheveled  HEAD: Normocephalic/atraumatic EYES: EOMI/PERRL ENMT: Mucous membranes moist; poor dentition NECK: supple no meningeal signs SPINE/BACK:entire spine nontender CV: S1/S2 noted, no murmurs/rubs/gallops noted LUNGS: Lungs are clear to auscultation bilaterally, no apparent distress ABDOMEN: soft, nontender, no rebound or guarding, bowel sounds noted throughout abdomen GU:no cva tenderness NEURO: Pt is awake/alert/appropriate, moves all extremitiesx4.  No facial droop.   EXTREMITIES: pulses normal/equal, full ROM; healed track marks to both arms. No evidence of cellulitis SKIN: warm, color normal PSYCH: no abnormalities of mood noted, alert and oriented to situation    ED Course  Procedures   DIAGNOSTIC STUDIES: Oxygen Saturation is 100% on RA, normal by my interpretation.    COORDINATION OF CARE: 10:50 AM- Pt advised of plan for treatment and pt agrees. 11:08 AM D/w cathy Ressel his mother,she filled out IVC paper He has not made specific threats to harm himself or other, but mentioned one time that he did not want to wake up.   Pt denies SI and any thoughts of harm IVC rescinded and d/c with outpatient followup   MDM  Final diagnoses:  Cocaine abuse    Nursing notes including past medical history and social history reviewed and considered in documentation   I personally performed the services described in this documentation, which was scribed in my presence. The recorded information has been reviewed and is accurate.      Alex Fraise, MD 06/22/14 1310

## 2014-06-22 NOTE — Discharge Instructions (Signed)
Cocaine Cocaine stimulates the central nervous system. As a stimulant, cocaine has the ability to improve athletic performance through increasing speed, endurance, and concentration, as well as decreasing fatigue. Although cocaine may seem to be beneficial for athletics, it is highly addicting and has many debilitating side effects. Cocaine has caused the deaths of many athletes, and its use is banned by every major athletic organization in the world. The clinical effect of cocaine (the high) is very short in duration. Cocaine works in the brain by altering the normal concentrations of chemicals that stimulate the brain cells.  WHY ATHLETES USE IT  Many athletes use cocaine for its central nervous system stimulating properties. It is also used as a recreational drug due to the euphoric felling it produces.  ADVERSE EFFECTS   Sleep disturbances.  Abnormal heart rhythms.  Stroke.  Heart attack.  Seizures.  Elevated blood pressure.  Death.  Paranoia (feeling that people want to hurt you).  Panic attacks (sudden feelings of anxiety or shortness of breath).  Suicidal behavior (wanting to kill yourself).  Homicidal behavior (wanting to kill other people).  Depression (feeling very sad, having decreased energy for activities).  Poor athletic performance. PHARMACOLOGY  Cocaine acts on the body for a short period of time; the clinical effects may last less than1 hour. Since most athletic competitions last for more than 1 hour, cocaine use may not improve athletic performance. The use of cocaine makes individuals much more susceptible for serious conditions such as seizures, arrhythmia (irregular heart beat), and strokes. Even a single dose of cocaine can be detected on a drug test for up to about 30 hours.  PREVENTION Most athletes use cocaine as a recreational drug and not for the purpose of enhancing athletic performance. To prevent the use of cocaine, athletes must be educated on its side  effects and the risk of addiction. If an athlete is found using cocaine, counseling and treatment are almost always required.  Document Released: 03/11/2005 Document Revised: 06/03/2011 Document Reviewed: 06/23/2008 [facility] Patient Information 2015 [place], [place]. This information is not intended to replace advice given to you by your health care provider. Make sure you discuss any questions you have with your health care provider.    [facility] Dept. [address]         [address] [place] Phone:  [phone]                                  Phone:  [phone]                   Phone:  [place], [phone]       -     Larence Penning  [facility] in [place], [address]                                  [place] [phone] or [phone] (After Hours)   [place]  Substance Abuse Resources: - Alcohol and Drug Services  [phone] - [place] [phone] - [facility] [place] [phone] - Residential & Outpatient Substance Abuse Program  [phone]  Psychological Services: - [place]  [place]  [place], [place] [address], [place], [place]: [phone] or [phone], [email or web address]  -  - [place] Department- [place] Department- [place] Department- [phone]

## 2014-06-22 NOTE — ED Notes (Addendum)
Per IVC papers (taken out by pt mother Langley Gauss. [place] [phone], [phone])- respondent states that the patient has a bad cocaine habit. Respondent is stealing things to support his habit. Respondent has made reference that he has to get enough cocaine so that he doesn't have to wake up; he doesn't want to be in this world.    Pt denies SI/HI. Pt states he uses cocaine and "enjoys getting high but doesn't want to kill himself". States last use was yesterday or the day before. Denies any other drug use. Pt states he "wrote his mother a letter to apologize for all the bad decisions he has made recently and for hurting her" but that she "took it as something else". Rn asked pt if he wants help to stop using cocaine. Pt stated "not really".

## 2014-06-22 NOTE — ED Notes (Signed)
IVC rescinded and paperwork faxed to magistrate's office.

## 2014-06-22 NOTE — ED Notes (Signed)
Pt brought in by [facility] with IVC paperwork taken out by pt's mother.  Pt says he is not SI or HI, just wants help getting off of cocaine.  [facility] reports pt wrote his mother a letter apologizing for his behavior, denies that is was a suicide note.

## 2015-05-30 ENCOUNTER — Emergency Department (HOSPITAL_COMMUNITY)
Admission: EM | Admit: 2015-05-30 | Discharge: 2015-05-30 | Disposition: A | Discharge status: Home / Self Care | Payer: Self-pay | Attending: Emergency Medicine | Admitting: Emergency Medicine

## 2015-05-30 ENCOUNTER — Encounter (HOSPITAL_COMMUNITY): Payer: Self-pay | Admitting: *Deleted

## 2015-05-30 DIAGNOSIS — F17200 Nicotine dependence, unspecified, uncomplicated: Secondary | ICD-10-CM | POA: Insufficient documentation

## 2015-05-30 DIAGNOSIS — K0889 Other specified disorders of teeth and supporting structures: Secondary | ICD-10-CM | POA: Insufficient documentation

## 2015-05-30 MED ORDER — PENICILLIN V POTASSIUM 500 MG PO TABS: 500.0000 mg | ORAL_TABLET | Freq: Four times a day (QID) | ORAL | Status: AC

## 2015-05-30 MED ORDER — IBUPROFEN 800 MG PO TABS
800.0000 mg | ORAL_TABLET | Freq: Once | ORAL | Status: AC
  Administered 2015-05-30: 800 mg via ORAL
  Filled 2015-05-30: qty 1

## 2015-05-30 NOTE — ED Provider Notes (Signed)
CSN: [id number]     Arrival date & time 05/30/15  1932 History   First MD Initiated Contact with Patient 05/30/15 1953     Chief Complaint  Patient presents with  . Dental Pain    Patient is a 35 y.o. male presenting with tooth pain. The history is provided by the patient.  Dental Pain Location:  Upper and lower Severity:  Moderate Onset quality:  Gradual Duration:  2 days Timing:  Constant Progression:  Worsening Chronicity:  New Associated symptoms: no fever     Past Medical History  Diagnosis Date  . Ruptured lumbar disc    History reviewed. No pertinent past surgical history. History reviewed. No pertinent family history. Social History  Substance Use Topics  . Smoking status: Current Every Day Smoker -- 1.00 packs/day  . Smokeless tobacco: None  . Alcohol Use: No    Review of Systems  Constitutional: Negative for fever.  Gastrointestinal: Negative for vomiting.      Allergies  Review of patient's allergies indicates no known allergies.  Home Medications   Prior to Admission medications   Medication Sig Start Date End Date Taking? Authorizing Provider  penicillin v potassium (VEETID) 500 MG tablet Take 1 tablet (500 mg total) by mouth 4 (four) times daily. 05/30/15 06/06/15  Ripley Fraise, MD   BP 176/90 mmHg  Pulse 80  Temp(Src) 98.5 F (36.9 C) (Oral)  Resp 18  Ht 6\' 4"  (1.93 m)  Wt 113.399 kg  BMI 30.44 kg/m2  SpO2 98% Physical Exam CONSTITUTIONAL: Well developed/well nourished HEAD AND FACE: Normocephalic/atraumatic ENMT: Mucous membranes moist.  Poor dentition.  No trismus.  No focal abscess noted. NECK: supple no meningeal signs CV: S1/S2 noted, no murmurs/rubs/gallops noted LUNGS: Lungs are clear to auscultation bilaterally, no apparent distress ABDOMEN: soft, nontender, no rebound or guarding NEURO: Pt is awake/alert, moves all extremitiesx4 EXTREMITIES:full ROM SKIN: warm, color normal  ED Course  Procedures   MDM   Final diagnoses:   Pain, dental    Nursing notes including past medical history and social history reviewed and considered in documentation     Ripley Fraise, MD 05/30/15 2007

## 2015-05-30 NOTE — ED Notes (Signed)
Pt c/o upper and lower toothache x 2 days; pt states he chipped his tooth on Sunday

## 2016-10-13 ENCOUNTER — Emergency Department (HOSPITAL_COMMUNITY)
Admission: EM | Admit: 2016-10-13 | Discharge: 2016-10-13 | Disposition: A | Discharge status: Home / Self Care | Payer: Self-pay | Attending: Emergency Medicine | Admitting: Emergency Medicine

## 2016-10-13 ENCOUNTER — Encounter (HOSPITAL_COMMUNITY): Payer: Self-pay | Admitting: Emergency Medicine

## 2016-10-13 DIAGNOSIS — L237 Allergic contact dermatitis due to plants, except food: Secondary | ICD-10-CM | POA: Insufficient documentation

## 2016-10-13 DIAGNOSIS — F17210 Nicotine dependence, cigarettes, uncomplicated: Secondary | ICD-10-CM | POA: Insufficient documentation

## 2016-10-13 MED ORDER — PREDNISONE 20 MG PO TABS: 20.0000 mg | ORAL_TABLET | Freq: Every day | ORAL | 0 refills | Status: DC

## 2016-10-13 MED ORDER — PREDNISONE 50 MG PO TABS
60.0000 mg | ORAL_TABLET | Freq: Once | ORAL | Status: AC
  Administered 2016-10-13: 60 mg via ORAL
  Filled 2016-10-13: qty 1

## 2016-10-13 NOTE — ED Triage Notes (Signed)
Patient c/o rash to arms, legs, and neck. Per patient started after trimming some bushes-believes he was exposed to poison oak/ivy. Reports itching. Patient using benadryl and ointment with no relief.

## 2016-10-13 NOTE — ED Provider Notes (Signed)
Alex [name] Provider Note   CSN: [id number] Arrival date & time: 10/13/16  1600     History   Chief Complaint Chief Complaint  Patient presents with  . Rash    HPI Alex [name] is a 36 y.o. male with no significant past medical history who present today complaining of diffused rash and pruritus. Patient report that he was trimming hedges at work on Thursday and was exposed to poison ivy. Patient started to develop rash and pruritus on Friday. He started using  hydrocortisone cream and benadryl without any improvement in symptoms. Rash was initially in his arms and legs and started to spread to his back, torso, neck. This morning patient notice some mild swelling around his eye, that's when he decided to come to the ED. Patient denies any difficulty breathing, any swelling of throat, lips, any chest pain, cough.  HPI  Past Medical History:  Diagnosis Date  . Ruptured lumbar disc     There are no active problems to display for this patient.   History reviewed. No pertinent surgical history.     Home Medications    Prior to Admission medications   Medication Sig Start Date End Date Taking? Authorizing Provider  predniSONE (DELTASONE) 20 MG tablet Take 1 tablet (20 mg total) by mouth daily with breakfast. Day 2-Day 3 take 60 mg daily (3 pills a day) Day 4-Day 6 take 40 mg daily ( 2 pills a day) Day 7-Day 10 take 20 mg daily (1 pill a day) 10/13/16   Marjie Skiff, MD    Family History History reviewed. No pertinent family history.  Social History Social History  Substance Use Topics  . Smoking status: Current Every Day Smoker    Packs/day: 1.00  . Smokeless tobacco: Never Used  . Alcohol use No     Allergies   Patient has no known allergies.   Review of Systems Review of Systems  Constitutional: Negative.   HENT: Negative.   Eyes:       Eye swelling  Respiratory: Negative.   Cardiovascular: Negative.   Gastrointestinal: Negative.     Endocrine: Negative.   Genitourinary: Negative.   Musculoskeletal: Negative.   Skin: Positive for rash.       Diffused maculopapular rash  Neurological: Negative.   Hematological: Negative.   Psychiatric/Behavioral: Negative.      Physical Exam Updated Vital Signs BP (!) 178/87 (BP Location: Right Arm)   Pulse 83   Temp 98.6 F (37 C) (Oral)   Resp 18   Ht 6\' 4"  (1.93 m)   Wt 124.7 kg (275 lb)   SpO2 100%   BMI 33.47 kg/m   Physical Exam  Constitutional: He is oriented to person, place, and time. He appears well-developed.  HENT:  Head: Normocephalic and atraumatic.  Eyes: Pupils are equal, round, and reactive to light. Conjunctivae and EOM are normal.  Bilateral mild periorbital swelling  Neck: Normal range of motion. Neck supple.  Cardiovascular: Normal rate, regular rhythm and normal heart sounds.   Pulmonary/Chest: Effort normal and breath sounds normal.  Abdominal: Soft. Bowel sounds are normal.  Musculoskeletal: Normal range of motion.  Neurological: He is alert and oriented to person, place, and time.  Skin:  Diffused macular papular rash upper and lower extremities, also involving neck, back, and torso.  Psychiatric: He has a normal mood and affect.     ED Treatments / Results  Labs (all labs ordered are listed, but only abnormal results are displayed) Labs Reviewed -  No data to display  EKG  EKG Interpretation None       Radiology No results found.  Procedures Procedures (including critical care time)  Medications Ordered in ED Medications  predniSONE (DELTASONE) tablet 60 mg (not administered)     Initial Impression / Assessment and Plan / ED Course  I have reviewed the triage vital signs and the nursing notes.  Pertinent labs & imaging results that were available during my care of the patient were reviewed by me and considered in my medical decision making (see chart for details).     Patient is 36 yo who present with diffused rash  and pruritus consistent with poison IV exposure. Patient failed outpatient therapy with topical hydrocortisone and benadry for the past two days. Patient with worsening symptoms and periorbital swelling. No sign of respiratory distress. Patient will be started on prednisone taper for 10 days. Patient given return precautions for any swelling of throat, lips or difficulty breathing. Patient has a good understanding and is agreement with plan.  Final Clinical Impressions(s) / ED Diagnoses   Final diagnoses:  Poison ivy dermatitis    New Prescriptions New Prescriptions   PREDNISONE (DELTASONE) 20 MG TABLET    Take 1 tablet (20 mg total) by mouth daily with breakfast. Day 2-Day 3 take 60 mg daily (3 pills a day) Day 4-Day 6 take 40 mg daily ( 2 pills a day) Day 7-Day 10 take 20 mg daily (1 pill a day)     Marjie Skiff, MD 10/13/16 1718    Elnora Morrison, MD 10/13/16 2321

## 2016-10-13 NOTE — Discharge Instructions (Signed)
Please make sure you return to the ED if you have any difficulty breathing, swelling of lips or throat.

## 2017-01-10 ENCOUNTER — Encounter (HOSPITAL_COMMUNITY): Payer: Self-pay | Admitting: Emergency Medicine

## 2017-01-10 ENCOUNTER — Emergency Department (HOSPITAL_COMMUNITY)
Admission: EM | Admit: 2017-01-10 | Discharge: 2017-01-10 | Disposition: A | Discharge status: Home / Self Care | Payer: Self-pay | Attending: Emergency Medicine | Admitting: Emergency Medicine

## 2017-01-10 DIAGNOSIS — L237 Allergic contact dermatitis due to plants, except food: Secondary | ICD-10-CM | POA: Insufficient documentation

## 2017-01-10 DIAGNOSIS — R21 Rash and other nonspecific skin eruption: Secondary | ICD-10-CM

## 2017-01-10 DIAGNOSIS — F17200 Nicotine dependence, unspecified, uncomplicated: Secondary | ICD-10-CM | POA: Insufficient documentation

## 2017-01-10 MED ORDER — PREDNISONE 20 MG PO TABS
40.0000 mg | ORAL_TABLET | Freq: Once | ORAL | Status: AC
  Administered 2017-01-10: 40 mg via ORAL
  Filled 2017-01-10: qty 2

## 2017-01-10 MED ORDER — DIPHENHYDRAMINE HCL 25 MG PO CAPS
25.0000 mg | ORAL_CAPSULE | Freq: Once | ORAL | Status: AC
  Administered 2017-01-10: 25 mg via ORAL
  Filled 2017-01-10: qty 1

## 2017-01-10 MED ORDER — PREDNISONE 20 MG PO TABS
ORAL_TABLET | ORAL | 0 refills | Status: DC
Start: 2017-01-10 — End: 2017-08-24

## 2017-01-10 MED ORDER — DEXAMETHASONE SODIUM PHOSPHATE 4 MG/ML IJ SOLN
10.0000 mg | Freq: Once | INTRAMUSCULAR | Status: DC
  Filled 2017-01-10: qty 3

## 2017-01-10 MED ORDER — DIPHENHYDRAMINE HCL 50 MG/ML IJ SOLN: 25.0000 mg | Freq: Once | INTRAMUSCULAR | Status: DC

## 2017-01-10 MED ORDER — FAMOTIDINE 20 MG PO TABS
40.0000 mg | ORAL_TABLET | Freq: Once | ORAL | Status: AC
Start: 2017-01-10 — End: 2017-01-10
  Administered 2017-01-10: 40 mg via ORAL
  Filled 2017-01-10: qty 2

## 2017-01-10 NOTE — Discharge Instructions (Signed)
Take prednisone as directed.  As we discussed, take Benadryl for symptomatic relief.  Follow-up with the referred dermatologist in 1 week if symptoms do not improve.  Return the emergency Department for any worsening rash, difficulty breathing, difficulty swallowing, tongue swelling or any other worsening or concerning symptoms.

## 2017-01-10 NOTE — ED Triage Notes (Signed)
Patient complaining of red rash to bilateral arms, chest, and face x 2 days. 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

## 2017-01-10 NOTE — ED Provider Notes (Signed)
[facility] EMERGENCY DEPARTMENT Provider Note   CSN: [id number] Arrival date & time: 01/10/17  1115     History   Chief Complaint Chief Complaint  Patient presents with  . Poison Oak    HPI Alex [name] is a 36 y.o. male 2 days of generalized rash to bilateral arms, chest, face. 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 He states that he does not know if there is any poison oak or ivy located in the area that he was working on. Patient reports that since then he has had generalized rash to his body. He reports that rash is pruritic but not painful. Patient states that he has not been taking any medications for symptoms. Patient reports that rash has continued to spread and is now starting to spread to his legs. Patient also reports that he had some bilateral eye swelling that has since improved. Patient denies any new lotions, soaps, detergents. He denies any new medications. Patient denies any fevers, difficulty breathing, oral angioedema, difficulty swallowing, chest pain.  The history is provided by the patient.    Past Medical History:  Diagnosis Date  . Ruptured lumbar disc     There are no active problems to display for this patient.   History reviewed. No pertinent surgical history.     Home Medications    Prior to Admission medications   Medication Sig Start Date End Date Taking? Authorizing Provider  predniSONE (DELTASONE) 20 MG tablet 3 tabs po daily x 3 days, then 2 tabs x 3 days, then 1.5 tabs x 3 days, then 1 tab x 3 days, then 0.5 tabs x 3 days 01/10/17   Orlie Dakin, MD    Family History History reviewed. No pertinent family history.  Social History Social History  Substance Use Topics  . Smoking status: Current Every Day Smoker    Packs/day: 1.00  . Smokeless tobacco: Never Used  . Alcohol use No     Allergies   Patient has no known allergies.   Review of Systems Review of Systems   Constitutional: Negative for fever.  HENT: Negative for trouble swallowing.   Respiratory: Negative for shortness of breath.   Cardiovascular: Negative for chest pain.  Skin: Positive for rash.     Physical Exam Updated Vital Signs BP 133/81 (BP Location: Right Arm)   Pulse 62   Temp 97.8 F (36.6 C) (Oral)   Resp 18   Ht 6\' 4"  (1.93 m)   Wt 113.4 kg (250 lb)   SpO2 95%   BMI 30.43 kg/m   Physical Exam  Constitutional: He appears well-developed and well-nourished.  Sitting comfortably on examination table  HENT:  Head: Normocephalic and atraumatic.  Mouth/Throat: Oropharynx is clear and moist and mucous membranes are normal.  No oral angioedema  Eyes: Pupils are equal, round, and reactive to light. Conjunctivae and EOM are normal. Right eye exhibits no discharge. Left eye exhibits no discharge. No scleral icterus.  Mild bilateral upper eyelid swelling. No swelling, erythema, ecchymosis, tenderness palpation to bilateral periorbital regions. EOMs intact without difficulty.  Pulmonary/Chest: Effort normal and breath sounds normal.  No evidence of respiratory distress. Able to speak in full sentences without difficulty.  Neurological: He is alert.  Skin: Skin is warm and dry. Capillary refill takes less than 2 seconds. Rash noted. There is erythema.  Diffuse erythematous rash noted to bilateral upper extremities. Right upper extremity with evidence of erythema, linear  excoriations. Patient also with generalized erythema Noted to the anterior chest and to the neck. No rash noted to palms or soles of feet.  Psychiatric: He has a normal mood and affect. His speech is normal and behavior is normal.  Nursing note and vitals reviewed.    ED Treatments / Results  Labs (all labs ordered are listed, but only abnormal results are displayed) Labs Reviewed - No data to display  EKG  EKG Interpretation None       Radiology No results found.  Procedures Procedures (including  critical care time)  Medications Ordered in ED Medications  famotidine (PEPCID) tablet 40 mg (40 mg Oral Given 01/10/17 1329)  diphenhydrAMINE (BENADRYL) capsule 25 mg (25 mg Oral Given 01/10/17 1329)  predniSONE (DELTASONE) tablet 40 mg (40 mg Oral Given 01/10/17 1329)     Initial Impression / Assessment and Plan / ED Course  I have reviewed the triage vital signs and the nursing notes.  Pertinent labs & imaging results that were available during my care of the patient were reviewed by me and considered in my medical decision making (see chart for details).     36 year old male who presents with generalized body rash that began 2 days ago. Patient reports that prior to onset of rash, he had exposure with moving including down trees. Thinks maybe he had contact with poison oak or poison ivy. Has not taken any medications at home. Patient is afebrile, non-toxic appearing, sitting comfortably on examination table. Vital signs reviewed and stable. No evidence of respiratory distress on exam. O2 sats are 100% on room air. Consider poison oak.ivy contact dermatitis. History/physical exam are not concerning for shingles, SJS, TENS. Do not suspect drug eruption given that patient has not been currently on any new medications. No evidence of super infected rash. Discussed patient with Dr. Winfred Leeds. Will plan to give patient prednisone and Benadryl here in the department. Plan to send home patient with prescription for prednisone. Encouraged patient to use Benadryl for symptomatic relief. Patient provided with dermatology referral to follow-up with if symptoms do not improve in the next week. Strict return precautions discussed. Patient expresses understanding and agreement to plan.     Final Clinical Impressions(s) / ED Diagnoses   Final diagnoses:  Rash  Poison ivy    New Prescriptions Discharge Medication List as of 01/10/2017  1:57 PM       Volanda Napoleon, PA-C 01/10/17 1417      Orlie Dakin, MD 01/10/17 [id number]

## 2017-01-10 NOTE — ED Provider Notes (Signed)
Complains of pruritic rash for prostate 1 week. Patient states she's been exposed to poison oak. He works outside. He denies any difficulty breathing or swallowing no visual changes. Treated with Benadryl. With partial relief. States the swelling in his face has improved. On exam patient is alert nontoxic HEENT exam minimal swelling of eyelids. Eyes otherwise appear normal. Mucous murmurs are moist. No mucosal lesions. Lungs clear to auscultation skin there is a pinkish rash about extremities and trunk which is pruritic   Orlie Dakin, MD 01/10/17 1329

## 2017-08-24 ENCOUNTER — Emergency Department (HOSPITAL_COMMUNITY)
Admission: EM | Admit: 2017-08-24 | Discharge: 2017-08-24 | Disposition: A | Discharge status: Home / Self Care | Payer: BLUE CROSS/BLUE SHIELD | Attending: Emergency Medicine | Admitting: Emergency Medicine

## 2017-08-24 ENCOUNTER — Emergency Department (HOSPITAL_COMMUNITY): Payer: BLUE CROSS/BLUE SHIELD

## 2017-08-24 ENCOUNTER — Encounter (HOSPITAL_COMMUNITY): Payer: Self-pay | Admitting: *Deleted

## 2017-08-24 ENCOUNTER — Other Ambulatory Visit: Payer: Self-pay

## 2017-08-24 DIAGNOSIS — R0981 Nasal congestion: Secondary | ICD-10-CM | POA: Diagnosis present

## 2017-08-24 DIAGNOSIS — F17210 Nicotine dependence, cigarettes, uncomplicated: Secondary | ICD-10-CM | POA: Insufficient documentation

## 2017-08-24 DIAGNOSIS — J209 Acute bronchitis, unspecified: Secondary | ICD-10-CM | POA: Insufficient documentation

## 2017-08-24 DIAGNOSIS — R062 Wheezing: Secondary | ICD-10-CM

## 2017-08-24 MED ORDER — AZITHROMYCIN 250 MG PO TABS: ORAL_TABLET | ORAL | 0 refills | Status: DC

## 2017-08-24 MED ORDER — IPRATROPIUM-ALBUTEROL 0.5-2.5 (3) MG/3ML IN SOLN
3.0000 mL | Freq: Once | RESPIRATORY_TRACT | Status: AC
  Administered 2017-08-24: 3 mL via RESPIRATORY_TRACT
  Filled 2017-08-24: qty 3

## 2017-08-24 MED ORDER — BENZONATATE 100 MG PO CAPS: 200.0000 mg | ORAL_CAPSULE | Freq: Three times a day (TID) | ORAL | 0 refills | Status: DC | PRN

## 2017-08-24 MED ORDER — AZITHROMYCIN 250 MG PO TABS
500.0000 mg | ORAL_TABLET | Freq: Once | ORAL | Status: AC
  Administered 2017-08-24: 500 mg via ORAL
  Filled 2017-08-24: qty 2

## 2017-08-24 MED ORDER — ALBUTEROL SULFATE HFA 108 (90 BASE) MCG/ACT IN AERS
INHALATION_SPRAY | RESPIRATORY_TRACT | Status: AC
  Filled 2017-08-24: qty 6.7

## 2017-08-24 MED ORDER — PREDNISONE 50 MG PO TABS: ORAL_TABLET | ORAL | 0 refills | Status: DC

## 2017-08-24 MED ORDER — ALBUTEROL SULFATE HFA 108 (90 BASE) MCG/ACT IN AERS
2.0000 | INHALATION_SPRAY | Freq: Once | RESPIRATORY_TRACT | Status: AC
  Administered 2017-08-24: 2 via RESPIRATORY_TRACT

## 2017-08-24 MED ORDER — PREDNISONE 50 MG PO TABS
60.0000 mg | ORAL_TABLET | Freq: Once | ORAL | Status: AC
  Administered 2017-08-24: 60 mg via ORAL
  Filled 2017-08-24: qty 1

## 2017-08-24 MED ORDER — ALBUTEROL SULFATE (2.5 MG/3ML) 0.083% IN NEBU
2.5000 mg | INHALATION_SOLUTION | Freq: Once | RESPIRATORY_TRACT | Status: AC
  Administered 2017-08-24: 2.5 mg via RESPIRATORY_TRACT
  Filled 2017-08-24: qty 3

## 2017-08-24 MED ORDER — PROMETHAZINE-CODEINE 6.25-10 MG/5ML PO SYRP
5.0000 mL | ORAL_SOLUTION | Freq: Once | ORAL | Status: AC
  Administered 2017-08-24: 5 mL via ORAL
  Filled 2017-08-24: qty 5

## 2017-08-24 MED ORDER — ALBUTEROL SULFATE (2.5 MG/3ML) 0.083% IN NEBU
5.0000 mg | INHALATION_SOLUTION | Freq: Once | RESPIRATORY_TRACT | Status: AC
  Administered 2017-08-24: 5 mg via RESPIRATORY_TRACT
  Filled 2017-08-24: qty 6

## 2017-08-24 NOTE — ED Provider Notes (Signed)
[facility] EMERGENCY DEPARTMENT Provider Note   CSN: [id number] Arrival date & time: 08/24/17  1713     History   Chief Complaint Chief Complaint  Patient presents with  . Fever    HPI Alex [name] is a 37 y.o. male presenting with nasal congestion, post nasal drip, sore throat and cough 3 days ago.  He reports feeling somewhat better until yesterday when he started to develop worsened cough productive of a brown to green sputum, wheezing with shortness of breath with exertion in association with persistent subjective fever.  He is a 1ppd smoker and reports several prior diagnoses of pneumonia.  He denies chest pain, weakness, n/v or abdominal pain.  He has taken robitussin without significant relief.  The history is provided by the patient.    Past Medical History:  Diagnosis Date  . Ruptured lumbar disc     There are no active problems to display for this patient.   History reviewed. No pertinent surgical history.      Home Medications    Prior to Admission medications   Medication Sig Start Date End Date Taking? Authorizing Provider  azithromycin (ZITHROMAX Z-PAK) 250 MG tablet Take one tablet daily for 4 days 08/25/17   Eria Lozoya, Almyra Free, PA-C  benzonatate (TESSALON) 100 MG capsule Take 2 capsules (200 mg total) by mouth 3 (three) times daily as needed for cough. 08/24/17   Evalee Jefferson, PA-C  predniSONE (DELTASONE) 50 MG tablet 1 tablet daily for 5 days 08/25/17   Evalee Jefferson, PA-C    Family History History reviewed. No pertinent family history.  Social History Social History   Tobacco Use  . Smoking status: Current Every Day Smoker    Packs/day: 1.00    Types: Cigarettes  . Smokeless tobacco: Never Used  Substance Use Topics  . Alcohol use: No  . Drug use: Not Currently    Types: Marijuana    Comment: denies use 08/24/17     Allergies   Patient has no known allergies.   Review of Systems Review of Systems  Constitutional: Negative for fever.  HENT:  Positive for congestion, rhinorrhea and sore throat. Negative for facial swelling, sinus pressure and sinus pain.   Eyes: Negative.   Respiratory: Positive for cough, shortness of breath and wheezing. Negative for chest tightness.   Cardiovascular: Negative for chest pain.  Gastrointestinal: Negative for abdominal pain, nausea and vomiting.  Genitourinary: Negative.   Musculoskeletal: Negative for arthralgias, joint swelling and neck pain.  Skin: Negative.  Negative for rash and wound.  Neurological: Negative for dizziness, weakness, light-headedness, numbness and headaches.  Psychiatric/Behavioral: Negative.      Physical Exam Updated Vital Signs BP (!) 149/85 (BP Location: Right Arm)   Pulse (!) 51   Temp (!) 97.5 F (36.4 C) (Temporal)   Resp 18   Ht 6\' 4"  (1.93 m)   Wt 100.7 kg (222 lb)   SpO2 96%   BMI 27.02 kg/m   Physical Exam  Constitutional: He appears well-developed and well-nourished.  HENT:  Head: Normocephalic and atraumatic.  Right Ear: Tympanic membrane normal.  Left Ear: Tympanic membrane normal.  Nose: Mucosal edema present.  Mouth/Throat: Uvula is midline, oropharynx is clear and moist and mucous membranes are normal. No oropharyngeal exudate, posterior oropharyngeal edema or posterior oropharyngeal erythema.  Eyes: Conjunctivae are normal.  Neck: Normal range of motion.  Cardiovascular: Normal rate, regular rhythm, normal heart sounds and intact distal pulses.  Pulmonary/Chest: Effort normal. He has decreased breath sounds. He  has wheezes. He has rhonchi.  Expiratory wheeze with prolonged respirations.  Rhonchi most bilateral bases, right>left.  Abdominal: Soft. Bowel sounds are normal. There is no tenderness.  Musculoskeletal: Normal range of motion.  Neurological: He is alert.  Skin: Skin is warm and dry.  Psychiatric: He has a normal mood and affect.  Nursing note and vitals reviewed.    ED Treatments / Results  Labs (all labs ordered are  listed, but only abnormal results are displayed) Labs Reviewed - No data to display  EKG None  Radiology Dg Chest 2 View  Result Date: 08/24/2017 CLINICAL DATA:  Three-day history of productive cough, shortness of breath, wheezing and fever. EXAM: CHEST - 2 VIEW COMPARISON:  None. FINDINGS: Cardiac silhouette upper normal in size. Hilar and mediastinal contours unremarkable. Prominent bronchovascular markings diffusely and mild to moderate central peribronchial thickening. Lungs otherwise clear. No localized airspace consolidation. No pleural effusions. No pneumothorax. Normal pulmonary vascularity. Visualized bony thorax intact. IMPRESSION: Mild-to-moderate changes of bronchitis and/or asthma without focal airspace pneumonia. Electronically Signed   By: Evangeline Dakin M.D.   On: 08/24/2017 18:34    Procedures Procedures (including critical care time)  Medications Ordered in ED Medications  albuterol (PROVENTIL HFA;VENTOLIN HFA) 108 (90 Base) MCG/ACT inhaler (  Not Given 08/24/17 1940)  ipratropium-albuterol (DUONEB) 0.5-2.5 (3) MG/3ML nebulizer solution 3 mL (3 mLs Nebulization Given 08/24/17 1749)  albuterol (PROVENTIL) (2.5 MG/3ML) 0.083% nebulizer solution 2.5 mg (2.5 mg Nebulization Given 08/24/17 1749)  predniSONE (DELTASONE) tablet 60 mg (60 mg Oral Given 08/24/17 1741)  albuterol (PROVENTIL) (2.5 MG/3ML) 0.083% nebulizer solution 5 mg (5 mg Nebulization Given 08/24/17 1921)  azithromycin (ZITHROMAX) tablet 500 mg (500 mg Oral Given 08/24/17 1857)  promethazine-codeine (PHENERGAN with CODEINE) 6.25-10 MG/5ML syrup 5 mL (5 mLs Oral Given 08/24/17 1857)  albuterol (PROVENTIL HFA;VENTOLIN HFA) 108 (90 Base) MCG/ACT inhaler 2 puff (2 puffs Inhalation Given 08/24/17 1943)     Initial Impression / Assessment and Plan / ED Course  I have reviewed the triage vital signs and the nursing notes.  Pertinent labs & imaging results that were available during my care of the patient were reviewed by me and  considered in my medical decision making (see chart for details).     Patient was given an albuterol and Atrovent neb treatment with no significant improvement in wheezing or air movement.  This was repeated x1 with persistent expiratory wheeze but somewhat better air movement.  Offered a third neb treatment which patient refused.  He was given prednisone 60 mg p.o. while here, also given a dose of Phenergan with codeine cough syrup with significant improvement in the symptoms.  He was started on Zithromax given his smoking history, I wanted to cover him for a possible bacterial source of this presumptive acute bronchitis.  Advised PRN follow-up with his PCP or returning here for any worsening symptoms.  He was given an albuterol MDI for home use and instructed in its use including the spacer which was provided.  He was given information regarding smoking cessation.  He had no hypoxia upon discharge.  He ambulated in the department without worsening symptoms.  Final Clinical Impressions(s) / ED Diagnoses   Final diagnoses:  Acute bronchitis, unspecified organism  Wheezing    ED Discharge Orders        Ordered    azithromycin (ZITHROMAX Z-PAK) 250 MG tablet     08/24/17 1948    predniSONE (DELTASONE) 50 MG tablet     08/24/17 1948  benzonatate (TESSALON) 100 MG capsule  3 times daily PRN     08/24/17 1949       Landis Martins 08/24/17 1954    Virgel Manifold, MD 08/27/17 1257

## 2017-08-24 NOTE — ED Notes (Signed)
Fever as high as 99 per pt at home

## 2017-08-24 NOTE — ED Notes (Signed)
Michael NT ambulating pt in hall

## 2017-08-24 NOTE — ED Triage Notes (Signed)
Pt with fever, chills, cough since Thursday.  Productive cough - green, brown in color per pt.

## 2017-08-24 NOTE — ED Notes (Signed)
Patient seen and evaluated by EDPa for initial assessment.

## 2017-08-24 NOTE — Discharge Instructions (Addendum)
Your xray as discussed is clear for pneumonia but you do have signs and symptoms of acute bronchitis.  Take your next dose of the antibiotic zithromax tomorrow evening.  Take your next dose of prednisone also tomorrow evening - this will help resolve the wheezing.  The albuterol inhaler can be used - 2 puffs every 4 hours hours if you are coughing or wheezing.  Rest and make sure you are drinking plenty of fluids.  You may use claritin D or zyrtec D (no prescription needed) to help with your nasal drainage and congestion.

## 2017-08-24 NOTE — ED Notes (Signed)
Pt. O2 Saturation Remained steady at 97% while ambulating.

## 2017-09-24 DIAGNOSIS — F1190 Opioid use, unspecified, uncomplicated: Secondary | ICD-10-CM | POA: Insufficient documentation

## 2017-09-24 DIAGNOSIS — F1120 Opioid dependence, uncomplicated: Secondary | ICD-10-CM | POA: Diagnosis present

## 2017-09-24 DIAGNOSIS — F17210 Nicotine dependence, cigarettes, uncomplicated: Secondary | ICD-10-CM | POA: Diagnosis present

## 2017-11-18 DIAGNOSIS — F1510 Other stimulant abuse, uncomplicated: Secondary | ICD-10-CM

## 2018-06-09 ENCOUNTER — Emergency Department (HOSPITAL_COMMUNITY): Payer: No Typology Code available for payment source

## 2018-06-09 ENCOUNTER — Encounter (HOSPITAL_COMMUNITY): Payer: Self-pay | Admitting: Emergency Medicine

## 2018-06-09 ENCOUNTER — Other Ambulatory Visit: Payer: Self-pay

## 2018-06-09 ENCOUNTER — Emergency Department (HOSPITAL_COMMUNITY)
Admission: EM | Admit: 2018-06-09 | Discharge: 2018-06-09 | Disposition: A | Discharge status: Home / Self Care | Payer: No Typology Code available for payment source | Attending: Emergency Medicine | Admitting: Emergency Medicine

## 2018-06-09 DIAGNOSIS — Y929 Unspecified place or not applicable: Secondary | ICD-10-CM | POA: Insufficient documentation

## 2018-06-09 DIAGNOSIS — Y999 Unspecified external cause status: Secondary | ICD-10-CM | POA: Insufficient documentation

## 2018-06-09 DIAGNOSIS — F17210 Nicotine dependence, cigarettes, uncomplicated: Secondary | ICD-10-CM | POA: Insufficient documentation

## 2018-06-09 DIAGNOSIS — Y939 Activity, unspecified: Secondary | ICD-10-CM | POA: Insufficient documentation

## 2018-06-09 DIAGNOSIS — S93401A Sprain of unspecified ligament of right ankle, initial encounter: Secondary | ICD-10-CM | POA: Diagnosis not present

## 2018-06-09 DIAGNOSIS — S161XXA Strain of muscle, fascia and tendon at neck level, initial encounter: Secondary | ICD-10-CM

## 2018-06-09 DIAGNOSIS — Z79899 Other long term (current) drug therapy: Secondary | ICD-10-CM | POA: Diagnosis not present

## 2018-06-09 DIAGNOSIS — S199XXA Unspecified injury of neck, initial encounter: Secondary | ICD-10-CM | POA: Diagnosis present

## 2018-06-09 MED ORDER — HYDROCODONE-ACETAMINOPHEN 5-325 MG PO TABS
1.0000 | ORAL_TABLET | Freq: Once | ORAL | Status: AC
  Administered 2018-06-09: 1 via ORAL
  Filled 2018-06-09: qty 1

## 2018-06-09 MED ORDER — IBUPROFEN 800 MG PO TABS: 800.0000 mg | ORAL_TABLET | Freq: Three times a day (TID) | ORAL | 0 refills | Status: DC

## 2018-06-09 MED ORDER — DIAZEPAM 5 MG PO TABS: 5.0000 mg | ORAL_TABLET | Freq: Two times a day (BID) | ORAL | 0 refills | Status: DC

## 2018-06-09 MED ORDER — CYCLOBENZAPRINE HCL 10 MG PO TABS
10.0000 mg | ORAL_TABLET | Freq: Once | ORAL | Status: AC
  Administered 2018-06-09: 10 mg via ORAL
  Filled 2018-06-09: qty 1

## 2018-06-09 MED ORDER — CYCLOBENZAPRINE HCL 10 MG PO TABS: 10.0000 mg | ORAL_TABLET | Freq: Three times a day (TID) | ORAL | 0 refills | Status: DC | PRN

## 2018-06-09 MED ORDER — FENTANYL CITRATE (PF) 100 MCG/2ML IJ SOLN
50.0000 ug | Freq: Once | INTRAMUSCULAR | Status: AC
Start: 2018-06-09 — End: 2018-06-09
  Administered 2018-06-09: 50 ug via INTRAMUSCULAR
  Filled 2018-06-09: qty 2

## 2018-06-09 NOTE — Discharge Instructions (Addendum)
Elevate and apply ice packs on and off to your neck and ankle.  Use crutches for weightbearing.  Call 1 of the orthopedic providers listed in 1 week to arrange follow-up appointment if not improving.  Return to the ER for any worsening symptoms
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
## 2018-06-09 NOTE — ED Triage Notes (Signed)
Abrasion to back of neck noted.
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
## 2018-06-17 ENCOUNTER — Encounter (HOSPITAL_COMMUNITY): Payer: Self-pay | Admitting: Emergency Medicine

## 2018-06-17 ENCOUNTER — Other Ambulatory Visit: Payer: Self-pay

## 2018-06-17 ENCOUNTER — Emergency Department (HOSPITAL_COMMUNITY)
Admission: EM | Admit: 2018-06-17 | Discharge: 2018-06-17 | Disposition: A | Discharge status: Home / Self Care | Payer: Self-pay | Attending: Emergency Medicine | Admitting: Emergency Medicine

## 2018-06-17 DIAGNOSIS — Z0289 Encounter for other administrative examinations: Secondary | ICD-10-CM

## 2018-06-17 DIAGNOSIS — F17210 Nicotine dependence, cigarettes, uncomplicated: Secondary | ICD-10-CM | POA: Insufficient documentation

## 2018-06-17 DIAGNOSIS — Z0279 Encounter for issue of other medical certificate: Secondary | ICD-10-CM | POA: Insufficient documentation

## 2018-06-17 NOTE — Discharge Instructions (Addendum)
Please be sure to schedule follow-up with your primary care physician, or return here if you have new, or concerning changes.
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
## 2018-09-05 ENCOUNTER — Other Ambulatory Visit: Payer: Self-pay

## 2018-09-05 ENCOUNTER — Encounter (HOSPITAL_COMMUNITY): Payer: Self-pay

## 2018-09-05 ENCOUNTER — Emergency Department (HOSPITAL_COMMUNITY)
Admission: EM | Admit: 2018-09-05 | Discharge: 2018-09-05 | Disposition: A | Discharge status: Home / Self Care | Payer: Self-pay | Attending: Emergency Medicine | Admitting: Emergency Medicine

## 2018-09-05 DIAGNOSIS — Y9389 Activity, other specified: Secondary | ICD-10-CM | POA: Insufficient documentation

## 2018-09-05 DIAGNOSIS — S61012A Laceration without foreign body of left thumb without damage to nail, initial encounter: Secondary | ICD-10-CM | POA: Insufficient documentation

## 2018-09-05 DIAGNOSIS — W260XXA Contact with knife, initial encounter: Secondary | ICD-10-CM | POA: Insufficient documentation

## 2018-09-05 DIAGNOSIS — Y929 Unspecified place or not applicable: Secondary | ICD-10-CM | POA: Insufficient documentation

## 2018-09-05 DIAGNOSIS — Z79899 Other long term (current) drug therapy: Secondary | ICD-10-CM | POA: Insufficient documentation

## 2018-09-05 DIAGNOSIS — Y999 Unspecified external cause status: Secondary | ICD-10-CM | POA: Insufficient documentation

## 2018-09-05 DIAGNOSIS — F17210 Nicotine dependence, cigarettes, uncomplicated: Secondary | ICD-10-CM | POA: Insufficient documentation

## 2018-09-05 MED ORDER — LIDOCAINE HCL (PF) 2 % IJ SOLN
5.0000 mL | Freq: Once | INTRAMUSCULAR | Status: AC
  Administered 2018-09-05: 04:00:00

## 2018-09-05 MED ORDER — LIDOCAINE HCL (PF) 2 % IJ SOLN
INTRAMUSCULAR | Status: AC
  Filled 2018-09-05: qty 10

## 2018-09-05 NOTE — ED Provider Notes (Signed)
[facility] EMERGENCY DEPARTMENT Provider Note   CSN: [id number] Arrival date & time: 09/05/18  0320   History   Chief Complaint Chief Complaint  Patient presents with  . Laceration    HPI Alex [name] is a 38 y.o. male.   The history is provided by the patient.  He has history of ruptured lumbar disc, and comes in after suffering a laceration to his left thumb.  He was working on a project and he accidentally cut his thumb with an [employer] knife.  Last tetanus immunization was in 2017.  He denies other injury.  Past Medical History:  Diagnosis Date  . Ruptured lumbar disc     There are no active problems to display for this patient.   History reviewed. No pertinent surgical history.      Home Medications    Prior to Admission medications   Medication Sig Start Date End Date Taking? Authorizing Provider  benzonatate (TESSALON) 100 MG capsule Take 2 capsules (200 mg total) by mouth 3 (three) times daily as needed for cough. Patient not taking: Reported on 06/09/2018 08/24/17   Evalee Jefferson, PA-C  buprenorphine (SUBUTEX) 8 MG SUBL SL tablet Place 1 tablet under the tongue 3 (three) times daily. 06/05/18   [provider]  cyclobenzaprine (FLEXERIL) 10 MG tablet Take 1 tablet (10 mg total) by mouth 3 (three) times daily as needed. 06/09/18   Triplett, Tammy, PA-C  diazepam (VALIUM) 5 MG tablet Take 1 tablet (5 mg total) by mouth 2 (two) times daily. 06/09/18   Triplett, Tammy, PA-C  ibuprofen (ADVIL,MOTRIN) 800 MG tablet Take 1 tablet (800 mg total) by mouth 3 (three) times daily. 06/09/18   Triplett, Tammy, PA-C  naloxone (NARCAN) nasal spray 4 mg/0.1 mL Place 1 spray into the nose once. As needed for up to 1 dose. 01/16/18   [provider]    Family History No family history on file.  Social History Social History   Tobacco Use  . Smoking status: Current Every Day Smoker    Packs/day: 1.00    Types: Cigarettes  . Smokeless tobacco: Never Used   Substance Use Topics  . Alcohol use: No  . Drug use: Not Currently    Types: Marijuana    Comment: denies use 08/24/17     Allergies   Patient has no known allergies.   Review of Systems Review of Systems  All other systems reviewed and are negative.    Physical Exam Updated Vital Signs Ht 6\' 4"  (1.93 m)   Wt 113.4 kg   BMI 30.43 kg/m   Physical Exam Vitals signs and nursing note reviewed.    38 year old male, resting comfortably and in no acute distress. Vital signs are significant for mildly elevated blood pressure. Oxygen saturation is 99%, which is normal. Head is normocephalic and atraumatic. PERRLA, EOMI. Oropharynx is clear. Neck is nontender and supple without adenopathy or JVD. Back is nontender and there is no CVA tenderness. Lungs are clear without rales, wheezes, or rhonchi. Chest is nontender. Heart has regular rate and rhythm without murmur. Abdomen is soft, flat, nontender without masses or hepatosplenomegaly and peristalsis is normoactive. Extremities have no cyanosis or edema, full range of motion is present.  Laceration is present over the ulnar aspect of the left thumb proximal phalanx.  Distal sensation is intact.  There is no instability of the IPJ.  Capillary refill is prompt.  There is normal strength of flexion and extension against resistance. Skin is  warm and dry without rash. Neurologic: Mental status is normal, cranial nerves are intact, there are no motor or sensory deficits.  ED Treatments / Results   Procedures .Marland KitchenLaceration Repair  Date/Time: 09/05/2018 4:11 AM Performed by: Delora Fuel, MD Authorized by: Delora Fuel, MD   Consent:    Consent obtained:  Verbal   Consent given by:  Patient   Risks discussed:  Infection and pain   Alternatives discussed:  No treatment Anesthesia (see MAR for exact dosages):    Anesthesia method:  Local infiltration   Local anesthetic:  Lidocaine 2% w/o epi Laceration details:    Location:  Finger    Finger location:  L thumb   Length (cm):  5   Depth (mm):  4 Repair type:    Repair type:  Simple Pre-procedure details:    Preparation:  Patient was prepped and draped in usual sterile fashion Exploration:    Hemostasis achieved with:  Direct pressure   Wound exploration: entire depth of wound probed and visualized     Wound extent: no foreign bodies/material noted, no nerve damage noted, no tendon damage noted, no underlying fracture noted and no vascular damage noted     Contaminated: no   Treatment:    Area cleansed with:  Saline   Amount of cleaning:  Standard Skin repair:    Repair method:  Sutures   Suture size:  4-0   Suture material:  Nylon   Suture technique:  Simple interrupted   Number of sutures:  11 Approximation:    Approximation:  Close Post-procedure details:    Dressing:  Antibiotic ointment and non-adherent dressing   Patient tolerance of procedure:  Tolerated well, no immediate complications     Medications Ordered in ED Medications  lidocaine (XYLOCAINE) 2 % injection 5 mL (has no administration in time range)  lidocaine (XYLOCAINE) 2 % injection (has no administration in time range)     Initial Impression / Assessment and Plan / ED Course  I have reviewed the triage vital signs and the nursing notes.  Laceration of the left thumb.  Suture repair done in the ED. sutures and removed in 7-10 days.  Final Clinical Impressions(s) / ED Diagnoses   Final diagnoses:  Laceration of left thumb, initial encounter    ED Discharge Orders    None       Delora Fuel, MD 63/81/77 [phone]

## 2018-09-05 NOTE — ED Triage Notes (Signed)
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  Bleeding controlled at this time.

## 2020-01-09 ENCOUNTER — Other Ambulatory Visit: Payer: Self-pay

## 2020-01-09 ENCOUNTER — Encounter (HOSPITAL_COMMUNITY): Payer: Self-pay

## 2020-01-09 DIAGNOSIS — Z79899 Other long term (current) drug therapy: Secondary | ICD-10-CM | POA: Diagnosis not present

## 2020-01-09 DIAGNOSIS — F17210 Nicotine dependence, cigarettes, uncomplicated: Secondary | ICD-10-CM | POA: Diagnosis not present

## 2020-01-09 DIAGNOSIS — M79672 Pain in left foot: Secondary | ICD-10-CM | POA: Insufficient documentation

## 2020-01-09 NOTE — ED Triage Notes (Signed)
Pt arrives via POV c/o left distal foot pain, redness and swelling. Pt reports this was first noted 2 days ago and has been trying treat it at home but denies any injury or trauma. Redness and swelling noted in Triage but Pt has difficulty moving left great toe.

## 2020-01-10 ENCOUNTER — Emergency Department (HOSPITAL_COMMUNITY)
Admission: EM | Admit: 2020-01-10 | Discharge: 2020-01-10 | Disposition: A | Discharge status: Home / Self Care | Payer: 59 | Attending: Emergency Medicine | Admitting: Emergency Medicine

## 2020-01-10 ENCOUNTER — Emergency Department (HOSPITAL_COMMUNITY): Payer: 59

## 2020-01-10 DIAGNOSIS — M79672 Pain in left foot: Secondary | ICD-10-CM

## 2020-01-10 MED ORDER — PREDNISONE 50 MG PO TABS: ORAL_TABLET | ORAL | 0 refills | Status: DC

## 2020-01-10 MED ORDER — PREDNISONE 50 MG PO TABS
60.0000 mg | ORAL_TABLET | Freq: Once | ORAL | Status: AC
  Administered 2020-01-10: 60 mg via ORAL
  Filled 2020-01-10: qty 1

## 2020-01-10 NOTE — ED Provider Notes (Signed)
[facility] Provider Note   CSN: [id number] Arrival date & time: 01/09/20  2201     History Chief Complaint  Patient presents with  . Foot Pain    Alex [name] is a 39 y.o. male.  The history is provided by the patient and a parent.  Foot Pain This is a new problem. The current episode started 2 days ago. The problem occurs constantly. The problem has been gradually worsening. The symptoms are aggravated by walking. The symptoms are relieved by rest.  Patient reports pain and swelling along the side of his left foot near the great toe.  No trauma.  Denies puncture wounds.  It began to hurt spontaneously approximately 2 days ago.  Has never had this before.  No history of joint surgeries.  He is not diabetic.     Past Medical History:  Diagnosis Date  . Ruptured lumbar disc     There are no problems to display for this patient.   History reviewed. No pertinent surgical history.     History reviewed. No pertinent family history.  Social History   Tobacco Use  . Smoking status: Current Every Day Smoker    Packs/day: 1.00    Types: Cigarettes  . Smokeless tobacco: Never Used  Vaping Use  . Vaping Use: Never used  Substance Use Topics  . Alcohol use: No  . Drug use: Not Currently    Types: Marijuana    Comment: denies use 08/24/17    Home Medications Prior to Admission medications   Medication Sig Start Date End Date Taking? Authorizing Provider  lisinopril-hydrochlorothiazide (ZESTORETIC) 10-12.5 MG tablet Take 1 tablet by mouth daily. 11/12/19  Yes [provider]  buprenorphine (SUBUTEX) 8 MG SUBL SL tablet Place 1 tablet under the tongue 3 (three) times daily. 06/05/18   [provider]  cyclobenzaprine (FLEXERIL) 10 MG tablet Take 1 tablet (10 mg total) by mouth 3 (three) times daily as needed. 06/09/18   Triplett, Tammy, PA-C  diazepam (VALIUM) 5 MG tablet Take 1 tablet (5 mg total) by mouth 2 (two) times daily.  06/09/18   Triplett, Tammy, PA-C  ibuprofen (ADVIL,MOTRIN) 800 MG tablet Take 1 tablet (800 mg total) by mouth 3 (three) times daily. 06/09/18   Triplett, Tammy, PA-C  naloxone (NARCAN) nasal spray 4 mg/0.1 mL Place 1 spray into the nose once. As needed for up to 1 dose. 01/16/18   [provider]  predniSONE (DELTASONE) 50 MG tablet 1 tablet PO QD X5 days 01/10/20   Ripley Fraise, MD    Allergies    Patient has no known allergies.  Review of Systems   Review of Systems  Constitutional: Negative for fever.  Musculoskeletal: Positive for arthralgias and joint swelling.    Physical Exam Updated Vital Signs BP 106/68 (BP Location: Right Arm)   Pulse 87   Temp 98.4 F (36.9 C) (Oral)   Resp 18   Ht 1.93 m (6\' 4" )   Wt 117.9 kg   SpO2 99%   BMI 31.65 kg/m   Physical Exam CONSTITUTIONAL: Well developed/well nourished HEAD: Normocephalic/atraumatic EYES: EOMI ENMT: Mucous membranes moist NECK: supple no meningeal signs LUNGS:  no apparent distress NEURO: Pt is awake/alert/appropriate, moves all extremitiesx4.  No facial droop.   EXTREMITIES: pulses normal/equal, full ROM, tenderness and erythema of the left first MTP.  No crepitus.  No puncture wounds.  No lesions.  No abscess or fluctuance noted See photo below Erythema does not streak proximally into  the foot or leg Blister noted to the distal portion of the left great toe, this is likely chronic in nature SKIN: warm, color normal, see photo below PSYCH: no abnormalities of mood noted, alert and oriented to situation      ED Results / Procedures / Treatments   Labs (all labs ordered are listed, but only abnormal results are displayed) Labs Reviewed - No data to display  EKG None  Radiology DG Foot Complete Left  Result Date: 01/10/2020 CLINICAL DATA:  39 year old male with redness and swelling of the left foot. EXAM: LEFT FOOT - COMPLETE 3+ VIEW COMPARISON:  Left foot radiograph dated 02/11/2014  FINDINGS: There is no acute fracture or dislocation. The bones are well mineralized. No arthritic changes. Mild diffuse subcutaneous edema. No radiopaque foreign object or soft tissue gas. IMPRESSION: No acute osseous pathology. Electronically Signed   By: Anner Crete M.D.   On: 01/10/2020 01:16    Procedures Procedures Medications Ordered in ED Medications  predniSONE (DELTASONE) tablet 60 mg (60 mg Oral Given 01/10/20 0122)    ED Course  I have reviewed the triage vital signs and the nursing notes.  Pertinent  imaging results that were available during my care of the patient were reviewed by me and considered in my medical decision making (see chart for details).    MDM Rules/Calculators/A&P                          Patient with atraumatic pain around the left first MTP and toe.  Strong suspicion this represents gouty arthritis.  Afebrile, no signs of any puncture wounds or injuries.  No fluctuance or abscess noted to suggest infection. Plan to empirically treat with prednisone.  Patient will keep foot elevated and monitor his symptoms.  If there are any fevers, increased redness, worsening pain in the next 48 hours he is to return to the ER Final Clinical Impression(s) / ED Diagnoses Final diagnoses:  Foot pain, left    Rx / DC Orders ED Discharge Orders         Ordered    predniSONE (DELTASONE) 50 MG tablet        01/10/20 0150           Ripley Fraise, MD 01/10/20 [phone]

## 2020-01-10 NOTE — ED Notes (Signed)
Pt reports he has had increased redness and swelling to his L toe for two days   MP of L great toe with redness and swelling   Medial column of L foot with lesser redness   Pedal pulse strong   Pt denies injury   Hx Gout  Awaiting Dr Christy Gentles eval

## 2020-01-14 DIAGNOSIS — I10 Essential (primary) hypertension: Secondary | ICD-10-CM | POA: Insufficient documentation

## 2020-03-25 HISTORY — PX: CARDIAC VALVE SURGERY: SHX40

## 2020-07-12 DIAGNOSIS — M722 Plantar fascial fibromatosis: Secondary | ICD-10-CM | POA: Insufficient documentation

## 2020-07-18 ENCOUNTER — Inpatient Hospital Stay (HOSPITAL_COMMUNITY)
Admission: EM | Admit: 2020-07-18 | Discharge: 2020-08-07 | DRG: 853 | Discharge status: Against Medical Advice | Payer: 59 | Attending: Critical Care Medicine | Admitting: Critical Care Medicine

## 2020-07-18 ENCOUNTER — Other Ambulatory Visit: Payer: Self-pay

## 2020-07-18 ENCOUNTER — Emergency Department (HOSPITAL_COMMUNITY): Payer: 59

## 2020-07-18 ENCOUNTER — Encounter (HOSPITAL_COMMUNITY): Payer: Self-pay | Admitting: Emergency Medicine

## 2020-07-18 ENCOUNTER — Inpatient Hospital Stay (HOSPITAL_COMMUNITY): Payer: 59

## 2020-07-18 DIAGNOSIS — I071 Rheumatic tricuspid insufficiency: Secondary | ICD-10-CM | POA: Diagnosis present

## 2020-07-18 DIAGNOSIS — G9341 Metabolic encephalopathy: Secondary | ICD-10-CM | POA: Diagnosis present

## 2020-07-18 DIAGNOSIS — R4182 Altered mental status, unspecified: Secondary | ICD-10-CM | POA: Diagnosis present

## 2020-07-18 DIAGNOSIS — D6959 Other secondary thrombocytopenia: Secondary | ICD-10-CM | POA: Diagnosis present

## 2020-07-18 DIAGNOSIS — E162 Hypoglycemia, unspecified: Secondary | ICD-10-CM | POA: Diagnosis present

## 2020-07-18 DIAGNOSIS — I132 Hypertensive heart and chronic kidney disease with heart failure and with stage 5 chronic kidney disease, or end stage renal disease: Secondary | ICD-10-CM | POA: Diagnosis present

## 2020-07-18 DIAGNOSIS — J90 Pleural effusion, not elsewhere classified: Secondary | ICD-10-CM

## 2020-07-18 DIAGNOSIS — Z9911 Dependence on respirator [ventilator] status: Secondary | ICD-10-CM | POA: Diagnosis not present

## 2020-07-18 DIAGNOSIS — R6520 Severe sepsis without septic shock: Secondary | ICD-10-CM | POA: Diagnosis not present

## 2020-07-18 DIAGNOSIS — A4153 Sepsis due to Serratia: Secondary | ICD-10-CM | POA: Diagnosis present

## 2020-07-18 DIAGNOSIS — R162 Hepatomegaly with splenomegaly, not elsewhere classified: Secondary | ICD-10-CM | POA: Diagnosis present

## 2020-07-18 DIAGNOSIS — N200 Calculus of kidney: Secondary | ICD-10-CM | POA: Diagnosis present

## 2020-07-18 DIAGNOSIS — I959 Hypotension, unspecified: Secondary | ICD-10-CM | POA: Insufficient documentation

## 2020-07-18 DIAGNOSIS — A419 Sepsis, unspecified organism: Secondary | ICD-10-CM | POA: Diagnosis present

## 2020-07-18 DIAGNOSIS — D696 Thrombocytopenia, unspecified: Secondary | ICD-10-CM

## 2020-07-18 DIAGNOSIS — R29721 NIHSS score 21: Secondary | ICD-10-CM | POA: Diagnosis not present

## 2020-07-18 DIAGNOSIS — I6340 Cerebral infarction due to embolism of unspecified cerebral artery: Secondary | ICD-10-CM | POA: Diagnosis not present

## 2020-07-18 DIAGNOSIS — Z781 Physical restraint status: Secondary | ICD-10-CM

## 2020-07-18 DIAGNOSIS — Z4659 Encounter for fitting and adjustment of other gastrointestinal appliance and device: Secondary | ICD-10-CM

## 2020-07-18 DIAGNOSIS — N186 End stage renal disease: Secondary | ICD-10-CM | POA: Diagnosis present

## 2020-07-18 DIAGNOSIS — A021 Salmonella sepsis: Secondary | ICD-10-CM | POA: Diagnosis not present

## 2020-07-18 DIAGNOSIS — F1510 Other stimulant abuse, uncomplicated: Secondary | ICD-10-CM | POA: Diagnosis present

## 2020-07-18 DIAGNOSIS — Z452 Encounter for adjustment and management of vascular access device: Secondary | ICD-10-CM

## 2020-07-18 DIAGNOSIS — E871 Hypo-osmolality and hyponatremia: Secondary | ICD-10-CM | POA: Diagnosis not present

## 2020-07-18 DIAGNOSIS — D72829 Elevated white blood cell count, unspecified: Secondary | ICD-10-CM | POA: Diagnosis not present

## 2020-07-18 DIAGNOSIS — K8020 Calculus of gallbladder without cholecystitis without obstruction: Secondary | ICD-10-CM | POA: Diagnosis present

## 2020-07-18 DIAGNOSIS — L8990 Pressure ulcer of unspecified site, unspecified stage: Secondary | ICD-10-CM | POA: Diagnosis not present

## 2020-07-18 DIAGNOSIS — D62 Acute posthemorrhagic anemia: Secondary | ICD-10-CM | POA: Diagnosis not present

## 2020-07-18 DIAGNOSIS — J9601 Acute respiratory failure with hypoxia: Secondary | ICD-10-CM | POA: Diagnosis not present

## 2020-07-18 DIAGNOSIS — E875 Hyperkalemia: Secondary | ICD-10-CM | POA: Diagnosis not present

## 2020-07-18 DIAGNOSIS — I339 Acute and subacute endocarditis, unspecified: Secondary | ICD-10-CM | POA: Diagnosis not present

## 2020-07-18 DIAGNOSIS — R34 Anuria and oliguria: Secondary | ICD-10-CM | POA: Diagnosis present

## 2020-07-18 DIAGNOSIS — Z20822 Contact with and (suspected) exposure to covid-19: Secondary | ICD-10-CM | POA: Diagnosis present

## 2020-07-18 DIAGNOSIS — R401 Stupor: Secondary | ICD-10-CM | POA: Diagnosis not present

## 2020-07-18 DIAGNOSIS — R17 Unspecified jaundice: Secondary | ICD-10-CM | POA: Insufficient documentation

## 2020-07-18 DIAGNOSIS — F17210 Nicotine dependence, cigarettes, uncomplicated: Secondary | ICD-10-CM | POA: Diagnosis present

## 2020-07-18 DIAGNOSIS — Z01818 Encounter for other preprocedural examination: Secondary | ICD-10-CM

## 2020-07-18 DIAGNOSIS — Z09 Encounter for follow-up examination after completed treatment for conditions other than malignant neoplasm: Secondary | ICD-10-CM

## 2020-07-18 DIAGNOSIS — I509 Heart failure, unspecified: Secondary | ICD-10-CM | POA: Diagnosis not present

## 2020-07-18 DIAGNOSIS — I76 Septic arterial embolism: Secondary | ICD-10-CM | POA: Diagnosis present

## 2020-07-18 DIAGNOSIS — I358 Other nonrheumatic aortic valve disorders: Secondary | ICD-10-CM

## 2020-07-18 DIAGNOSIS — R7881 Bacteremia: Secondary | ICD-10-CM | POA: Diagnosis not present

## 2020-07-18 DIAGNOSIS — J9690 Respiratory failure, unspecified, unspecified whether with hypoxia or hypercapnia: Secondary | ICD-10-CM

## 2020-07-18 DIAGNOSIS — R570 Cardiogenic shock: Secondary | ICD-10-CM | POA: Diagnosis not present

## 2020-07-18 DIAGNOSIS — R7989 Other specified abnormal findings of blood chemistry: Secondary | ICD-10-CM | POA: Diagnosis not present

## 2020-07-18 DIAGNOSIS — G9340 Encephalopathy, unspecified: Secondary | ICD-10-CM

## 2020-07-18 DIAGNOSIS — R6521 Severe sepsis with septic shock: Secondary | ICD-10-CM | POA: Diagnosis present

## 2020-07-18 DIAGNOSIS — R609 Edema, unspecified: Secondary | ICD-10-CM | POA: Diagnosis not present

## 2020-07-18 DIAGNOSIS — E781 Pure hyperglyceridemia: Secondary | ICD-10-CM | POA: Diagnosis present

## 2020-07-18 DIAGNOSIS — F1120 Opioid dependence, uncomplicated: Secondary | ICD-10-CM | POA: Diagnosis present

## 2020-07-18 DIAGNOSIS — N170 Acute kidney failure with tubular necrosis: Secondary | ICD-10-CM | POA: Diagnosis present

## 2020-07-18 DIAGNOSIS — L89320 Pressure ulcer of left buttock, unstageable: Secondary | ICD-10-CM | POA: Diagnosis not present

## 2020-07-18 DIAGNOSIS — Z5329 Procedure and treatment not carried out because of patient's decision for other reasons: Secondary | ICD-10-CM | POA: Diagnosis present

## 2020-07-18 DIAGNOSIS — I38 Endocarditis, valve unspecified: Secondary | ICD-10-CM | POA: Diagnosis not present

## 2020-07-18 DIAGNOSIS — I330 Acute and subacute infective endocarditis: Secondary | ICD-10-CM | POA: Diagnosis present

## 2020-07-18 DIAGNOSIS — I5031 Acute diastolic (congestive) heart failure: Secondary | ICD-10-CM | POA: Diagnosis present

## 2020-07-18 DIAGNOSIS — N179 Acute kidney failure, unspecified: Secondary | ICD-10-CM

## 2020-07-18 DIAGNOSIS — I4891 Unspecified atrial fibrillation: Secondary | ICD-10-CM | POA: Diagnosis not present

## 2020-07-18 DIAGNOSIS — Z992 Dependence on renal dialysis: Secondary | ICD-10-CM

## 2020-07-18 DIAGNOSIS — T827XXA Infection and inflammatory reaction due to other cardiac and vascular devices, implants and grafts, initial encounter: Secondary | ICD-10-CM

## 2020-07-18 DIAGNOSIS — T82528A Displacement of other cardiac and vascular devices and implants, initial encounter: Secondary | ICD-10-CM

## 2020-07-18 DIAGNOSIS — I351 Nonrheumatic aortic (valve) insufficiency: Secondary | ICD-10-CM | POA: Diagnosis not present

## 2020-07-18 DIAGNOSIS — Z952 Presence of prosthetic heart valve: Secondary | ICD-10-CM

## 2020-07-18 DIAGNOSIS — R0902 Hypoxemia: Secondary | ICD-10-CM

## 2020-07-18 DIAGNOSIS — R4587 Impulsiveness: Secondary | ICD-10-CM | POA: Diagnosis not present

## 2020-07-18 DIAGNOSIS — Z9889 Other specified postprocedural states: Secondary | ICD-10-CM

## 2020-07-18 DIAGNOSIS — R739 Hyperglycemia, unspecified: Secondary | ICD-10-CM | POA: Diagnosis not present

## 2020-07-18 LAB — CBC WITH DIFFERENTIAL/PLATELET
Abs Immature Granulocytes: 0.29 10*3/uL — ABNORMAL HIGH (ref 0.00–0.07)
Basophils Absolute: 0 10*3/uL (ref 0.0–0.1)
Basophils Relative: 0 %
Eosinophils Absolute: 0 10*3/uL (ref 0.0–0.5)
Eosinophils Relative: 0 %
HCT: 40.8 % (ref 39.0–52.0)
Hemoglobin: 13.1 g/dL (ref 13.0–17.0)
Immature Granulocytes: 3 %
Lymphocytes Relative: 6 %
Lymphs Abs: 0.6 10*3/uL — ABNORMAL LOW (ref 0.7–4.0)
MCH: 27.8 pg (ref 26.0–34.0)
MCHC: 32.1 g/dL (ref 30.0–36.0)
MCV: 86.6 fL (ref 80.0–100.0)
Monocytes Absolute: 0.6 10*3/uL (ref 0.1–1.0)
Monocytes Relative: 5 %
Neutro Abs: 9.4 10*3/uL — ABNORMAL HIGH (ref 1.7–7.7)
Neutrophils Relative %: 86 %
Platelets: 49 10*3/uL — ABNORMAL LOW (ref 150–400)
RBC: 4.71 MIL/uL (ref 4.22–5.81)
RDW: 15.1 % (ref 11.5–15.5)
WBC Morphology: INCREASED
WBC: 10.9 10*3/uL — ABNORMAL HIGH (ref 4.0–10.5)
nRBC: 0 % (ref 0.0–0.2)

## 2020-07-18 LAB — URINALYSIS, ROUTINE W REFLEX MICROSCOPIC
Glucose, UA: NEGATIVE mg/dL
Ketones, ur: NEGATIVE mg/dL
Nitrite: NEGATIVE
Protein, ur: 100 mg/dL — AB
RBC / HPF: >50 RBC/hpf — ABNORMAL HIGH (ref 0–5)
Specific Gravity, Urine: 1.027 (ref 1.005–1.030)
WBC, UA: >50 WBC/hpf — ABNORMAL HIGH (ref 0–5)
pH: 5 (ref 5.0–8.0)

## 2020-07-18 LAB — COMPREHENSIVE METABOLIC PANEL
ALT: 32 U/L (ref 0–44)
ALT: 24 U/L (ref 0–44)
ALT: 25 U/L (ref 0–44)
AST: 63 U/L — ABNORMAL HIGH (ref 15–41)
AST: 53 U/L — ABNORMAL HIGH (ref 15–41)
AST: 58 U/L — ABNORMAL HIGH (ref 15–41)
Albumin: 2.5 g/dL — ABNORMAL LOW (ref 3.5–5.0)
Albumin: 1.9 g/dL — ABNORMAL LOW (ref 3.5–5.0)
Albumin: 1.9 g/dL — ABNORMAL LOW (ref 3.5–5.0)
Alkaline Phosphatase: 527 U/L — ABNORMAL HIGH (ref 38–126)
Alkaline Phosphatase: 593 U/L — ABNORMAL HIGH (ref 38–126)
Alkaline Phosphatase: 569 U/L — ABNORMAL HIGH (ref 38–126)
Anion gap: 23 — ABNORMAL HIGH (ref 5–15)
Anion gap: 17 — ABNORMAL HIGH (ref 5–15)
Anion gap: 15 (ref 5–15)
BUN: 59 mg/dL — ABNORMAL HIGH (ref 6–20)
BUN: 60 mg/dL — ABNORMAL HIGH (ref 6–20)
BUN: 62 mg/dL — ABNORMAL HIGH (ref 6–20)
CO2: 20 mmol/L — ABNORMAL LOW (ref 22–32)
CO2: 18 mmol/L — ABNORMAL LOW (ref 22–32)
CO2: 19 mmol/L — ABNORMAL LOW (ref 22–32)
Calcium: 9.2 mg/dL (ref 8.9–10.3)
Calcium: 8.2 mg/dL — ABNORMAL LOW (ref 8.9–10.3)
Calcium: 8.2 mg/dL — ABNORMAL LOW (ref 8.9–10.3)
Chloride: 89 mmol/L — ABNORMAL LOW (ref 98–111)
Chloride: 99 mmol/L (ref 98–111)
Chloride: 96 mmol/L — ABNORMAL LOW (ref 98–111)
Creatinine, Ser: 5.13 mg/dL — ABNORMAL HIGH (ref 0.61–1.24)
Creatinine, Ser: 4.83 mg/dL — ABNORMAL HIGH (ref 0.61–1.24)
Creatinine, Ser: 4.88 mg/dL — ABNORMAL HIGH (ref 0.61–1.24)
GFR, Estimated: 14 mL/min — ABNORMAL LOW (ref >60)
GFR, Estimated: 15 mL/min — ABNORMAL LOW (ref >60)
GFR, Estimated: 15 mL/min — ABNORMAL LOW (ref >60)
Glucose, Bld: 62 mg/dL — ABNORMAL LOW (ref 70–99)
Glucose, Bld: 69 mg/dL — ABNORMAL LOW (ref 70–99)
Glucose, Bld: 75 mg/dL (ref 70–99)
Potassium: 4.9 mmol/L (ref 3.5–5.1)
Potassium: 4.1 mmol/L (ref 3.5–5.1)
Potassium: 4.3 mmol/L (ref 3.5–5.1)
Sodium: 132 mmol/L — ABNORMAL LOW (ref 135–145)
Sodium: 134 mmol/L — ABNORMAL LOW (ref 135–145)
Sodium: 130 mmol/L — ABNORMAL LOW (ref 135–145)
Total Bilirubin: 5.6 mg/dL — ABNORMAL HIGH (ref 0.3–1.2)
Total Bilirubin: 5.4 mg/dL — ABNORMAL HIGH (ref 0.3–1.2)
Total Bilirubin: 6 mg/dL — ABNORMAL HIGH (ref 0.3–1.2)
Total Protein: 7.7 g/dL (ref 6.5–8.1)
Total Protein: 5.8 g/dL — ABNORMAL LOW (ref 6.5–8.1)
Total Protein: 6 g/dL — ABNORMAL LOW (ref 6.5–8.1)

## 2020-07-18 LAB — IRON AND TIBC
Iron: 50 ug/dL (ref 45–182)
Saturation Ratios: 31 % (ref 17.9–39.5)
TIBC: 162 ug/dL — ABNORMAL LOW (ref 250–450)
UIBC: 112 ug/dL

## 2020-07-18 LAB — BLOOD GAS, VENOUS
Acid-base deficit: 5.5 mmol/L — ABNORMAL HIGH (ref 0.0–2.0)
Bicarbonate: 19.3 mmol/L — ABNORMAL LOW (ref 20.0–28.0)
FIO2: 21
O2 Saturation: 79.7 %
Patient temperature: 35.7
pCO2, Ven: 39.1 mmHg — ABNORMAL LOW (ref 44.0–60.0)
pH, Ven: 7.316 (ref 7.250–7.430)
pO2, Ven: 48 mmHg — ABNORMAL HIGH (ref 32.0–45.0)

## 2020-07-18 LAB — LACTATE DEHYDROGENASE: LDH: 270 U/L — ABNORMAL HIGH (ref 98–192)

## 2020-07-18 LAB — RETICULOCYTES
Immature Retic Fract: 9.3 % (ref 2.3–15.9)
RBC.: 3.99 MIL/uL — ABNORMAL LOW (ref 4.22–5.81)
Retic Ct Pct: <0.4 % — ABNORMAL LOW (ref 0.4–3.1)

## 2020-07-18 LAB — AMMONIA: Ammonia: 22 umol/L (ref 9–35)

## 2020-07-18 LAB — CBC
HCT: 35.7 % — ABNORMAL LOW (ref 39.0–52.0)
HCT: 36.1 % — ABNORMAL LOW (ref 39.0–52.0)
HCT: 35.1 % — ABNORMAL LOW (ref 39.0–52.0)
Hemoglobin: 11.3 g/dL — ABNORMAL LOW (ref 13.0–17.0)
Hemoglobin: 11.4 g/dL — ABNORMAL LOW (ref 13.0–17.0)
Hemoglobin: 11.4 g/dL — ABNORMAL LOW (ref 13.0–17.0)
MCH: 27.8 pg (ref 26.0–34.0)
MCH: 27.4 pg (ref 26.0–34.0)
MCH: 27.7 pg (ref 26.0–34.0)
MCHC: 31.7 g/dL (ref 30.0–36.0)
MCHC: 31.6 g/dL (ref 30.0–36.0)
MCHC: 32.5 g/dL (ref 30.0–36.0)
MCV: 87.7 fL (ref 80.0–100.0)
MCV: 86.8 fL (ref 80.0–100.0)
MCV: 85.2 fL (ref 80.0–100.0)
Platelets: 31 10*3/uL — ABNORMAL LOW (ref 150–400)
Platelets: 35 10*3/uL — ABNORMAL LOW (ref 150–400)
Platelets: 30 10*3/uL — ABNORMAL LOW (ref 150–400)
RBC: 4.07 MIL/uL — ABNORMAL LOW (ref 4.22–5.81)
RBC: 4.16 MIL/uL — ABNORMAL LOW (ref 4.22–5.81)
RBC: 4.12 MIL/uL — ABNORMAL LOW (ref 4.22–5.81)
RDW: 15.3 % (ref 11.5–15.5)
RDW: 15.2 % (ref 11.5–15.5)
RDW: 15.5 % (ref 11.5–15.5)
WBC: 10.3 10*3/uL (ref 4.0–10.5)
WBC: 14 10*3/uL — ABNORMAL HIGH (ref 4.0–10.5)
WBC: 11.9 10*3/uL — ABNORMAL HIGH (ref 4.0–10.5)
nRBC: 0 % (ref 0.0–0.2)
nRBC: 0 % (ref 0.0–0.2)
nRBC: 0 % (ref 0.0–0.2)

## 2020-07-18 LAB — BASIC METABOLIC PANEL
Anion gap: 16 — ABNORMAL HIGH (ref 5–15)
BUN: 70 mg/dL — ABNORMAL HIGH (ref 6–20)
CO2: 20 mmol/L — ABNORMAL LOW (ref 22–32)
Calcium: 8.3 mg/dL — ABNORMAL LOW (ref 8.9–10.3)
Chloride: 96 mmol/L — ABNORMAL LOW (ref 98–111)
Creatinine, Ser: 5.37 mg/dL — ABNORMAL HIGH (ref 0.61–1.24)
GFR, Estimated: 13 mL/min — ABNORMAL LOW (ref >60)
Glucose, Bld: 109 mg/dL — ABNORMAL HIGH (ref 70–99)
Potassium: 4.5 mmol/L (ref 3.5–5.1)
Sodium: 132 mmol/L — ABNORMAL LOW (ref 135–145)

## 2020-07-18 LAB — ETHANOL: Alcohol, Ethyl (B): <10 mg/dL (ref <10)

## 2020-07-18 LAB — BLOOD GAS, ARTERIAL
Acid-base deficit: 4.5 mmol/L — ABNORMAL HIGH (ref 0.0–2.0)
Bicarbonate: 20.7 mmol/L (ref 20.0–28.0)
FIO2: 28
O2 Saturation: 93.3 %
Patient temperature: 37.6
pCO2 arterial: 37.2 mmHg (ref 32.0–48.0)
pH, Arterial: 7.353 (ref 7.350–7.450)
pO2, Arterial: 74.2 mmHg — ABNORMAL LOW (ref 83.0–108.0)

## 2020-07-18 LAB — FERRITIN: Ferritin: 1515 ng/mL — ABNORMAL HIGH (ref 24–336)

## 2020-07-18 LAB — DIC (DISSEMINATED INTRAVASCULAR COAGULATION)PANEL
D-Dimer, Quant: 7.25 ug/mL-FEU — ABNORMAL HIGH (ref 0.00–0.50)
Fibrinogen: >800 mg/dL — ABNORMAL HIGH (ref 210–475)
INR: 1.4 — ABNORMAL HIGH (ref 0.8–1.2)
Platelets: 33 10*3/uL — ABNORMAL LOW (ref 150–400)
Prothrombin Time: 17.1 seconds — ABNORMAL HIGH (ref 11.4–15.2)
Smear Review: NONE SEEN
aPTT: 33 seconds (ref 24–36)

## 2020-07-18 LAB — RESP PANEL BY RT-PCR (FLU A&B, COVID) ARPGX2
Influenza A by PCR: NEGATIVE
Influenza B by PCR: NEGATIVE
SARS Coronavirus 2 by RT PCR: NEGATIVE

## 2020-07-18 LAB — RAPID URINE DRUG SCREEN, HOSP PERFORMED
Amphetamines: POSITIVE — AB
Barbiturates: NOT DETECTED
Benzodiazepines: NOT DETECTED
Cocaine: NOT DETECTED
Opiates: NOT DETECTED
Tetrahydrocannabinol: NOT DETECTED

## 2020-07-18 LAB — HEPATITIS PANEL, ACUTE
HCV Ab: NONREACTIVE
Hep A IgM: NONREACTIVE
Hep B C IgM: NONREACTIVE
Hepatitis B Surface Ag: NONREACTIVE

## 2020-07-18 LAB — LACTIC ACID, PLASMA
Lactic Acid, Venous: 7.6 mmol/L (ref 0.5–1.9)
Lactic Acid, Venous: 6.1 mmol/L (ref 0.5–1.9)
Lactic Acid, Venous: 4.1 mmol/L (ref 0.5–1.9)
Lactic Acid, Venous: 2.6 mmol/L (ref 0.5–1.9)

## 2020-07-18 LAB — CBG MONITORING, ED
Glucose-Capillary: 64 mg/dL — ABNORMAL LOW (ref 70–99)
Glucose-Capillary: 93 mg/dL (ref 70–99)

## 2020-07-18 LAB — GLUCOSE, CAPILLARY
Glucose-Capillary: 107 mg/dL — ABNORMAL HIGH (ref 70–99)
Glucose-Capillary: 109 mg/dL — ABNORMAL HIGH (ref 70–99)
Glucose-Capillary: 96 mg/dL (ref 70–99)

## 2020-07-18 LAB — TSH: TSH: 2.611 u[IU]/mL (ref 0.350–4.500)

## 2020-07-18 LAB — VITAMIN B12: Vitamin B-12: 861 pg/mL (ref 180–914)

## 2020-07-18 LAB — CK: Total CK: 79 U/L (ref 49–397)

## 2020-07-18 LAB — CORTISOL: Cortisol, Plasma: 33 ug/dL

## 2020-07-18 LAB — PROCALCITONIN: Procalcitonin: 55.06 ng/mL

## 2020-07-18 LAB — MAGNESIUM: Magnesium: 2 mg/dL (ref 1.7–2.4)

## 2020-07-18 LAB — PHOSPHORUS: Phosphorus: 2.7 mg/dL (ref 2.5–4.6)

## 2020-07-18 LAB — FOLATE: Folate: 7.3 ng/mL (ref >5.9)

## 2020-07-18 MED ORDER — LACTATED RINGERS IV BOLUS
1000.0000 mL | Freq: Once | INTRAVENOUS | Status: AC
  Administered 2020-07-18: 1000 mL via INTRAVENOUS

## 2020-07-18 MED ORDER — NOREPINEPHRINE 4 MG/250ML-% IV SOLN
0.0000 ug/min | INTRAVENOUS | Status: DC
  Administered 2020-07-18: 13 ug/min via INTRAVENOUS
  Filled 2020-07-18 (×2): qty 250

## 2020-07-18 MED ORDER — SODIUM CHLORIDE 0.9 % IV SOLN
250.0000 mL | INTRAVENOUS | Status: DC
  Administered 2020-07-18 – 2020-07-22 (×2): 250 mL via INTRAVENOUS

## 2020-07-18 MED ORDER — FENTANYL CITRATE (PF) 100 MCG/2ML IJ SOLN
INTRAMUSCULAR | Status: AC
  Administered 2020-07-18: 100 ug via INTRAVENOUS
  Filled 2020-07-18: qty 2

## 2020-07-18 MED ORDER — DEXTROSE-NACL 5-0.9 % IV SOLN: INTRAVENOUS | Status: DC

## 2020-07-18 MED ORDER — SODIUM CHLORIDE 0.9 % IV BOLUS
1000.0000 mL | Freq: Once | INTRAVENOUS | Status: AC
  Administered 2020-07-18: 1000 mL via INTRAVENOUS

## 2020-07-18 MED ORDER — DEXTROSE 50 % IV SOLN
50.0000 mL | Freq: Once | INTRAVENOUS | Status: AC
  Administered 2020-07-18: 50 mL via INTRAVENOUS
  Filled 2020-07-18: qty 50

## 2020-07-18 MED ORDER — FENTANYL CITRATE (PF) 100 MCG/2ML IJ SOLN
INTRAMUSCULAR | Status: AC
  Filled 2020-07-18: qty 2

## 2020-07-18 MED ORDER — NOREPINEPHRINE 4 MG/250ML-% IV SOLN
0.0000 ug/min | INTRAVENOUS | Status: DC
  Administered 2020-07-19: 15 ug/min via INTRAVENOUS
  Administered 2020-07-19: 17 ug/min via INTRAVENOUS
  Filled 2020-07-18: qty 500

## 2020-07-18 MED ORDER — ORAL CARE MOUTH RINSE
15.0000 mL | Freq: Two times a day (BID) | OROMUCOSAL | Status: DC
  Administered 2020-07-19 – 2020-07-20 (×2): 15 mL via OROMUCOSAL

## 2020-07-18 MED ORDER — CHLORHEXIDINE GLUCONATE 0.12 % MT SOLN
15.0000 mL | Freq: Two times a day (BID) | OROMUCOSAL | Status: DC
  Administered 2020-07-18 – 2020-07-20 (×4): 15 mL via OROMUCOSAL
  Filled 2020-07-18: qty 15

## 2020-07-18 MED ORDER — SODIUM CHLORIDE 0.9 % IV SOLN
2.0000 g | Freq: Once | INTRAVENOUS | Status: AC
  Administered 2020-07-18: 2 g via INTRAVENOUS
  Filled 2020-07-18: qty 2

## 2020-07-18 MED ORDER — ZIPRASIDONE MESYLATE 20 MG IM SOLR
20.0000 mg | Freq: Once | INTRAMUSCULAR | Status: AC
  Administered 2020-07-18: 20 mg via INTRAMUSCULAR

## 2020-07-18 MED ORDER — NOREPINEPHRINE 4 MG/250ML-% IV SOLN
INTRAVENOUS | Status: AC
  Administered 2020-07-18: 5 ug/min via INTRAVENOUS
  Filled 2020-07-18: qty 250

## 2020-07-18 MED ORDER — FENTANYL CITRATE (PF) 100 MCG/2ML IJ SOLN
100.0000 ug | Freq: Once | INTRAMUSCULAR | Status: AC
  Administered 2020-07-25: 100 ug via INTRAVENOUS
  Filled 2020-07-18: qty 2

## 2020-07-18 MED ORDER — LORAZEPAM 2 MG/ML IJ SOLN: 2.0000 mg | Freq: Once | INTRAMUSCULAR | Status: AC

## 2020-07-18 MED ORDER — VANCOMYCIN HCL 1750 MG/350ML IV SOLN: 1750.0000 mg | INTRAVENOUS | Status: DC

## 2020-07-18 MED ORDER — SODIUM CHLORIDE 0.9 % IV SOLN
2.0000 g | Freq: Two times a day (BID) | INTRAVENOUS | Status: DC
  Administered 2020-07-18 – 2020-07-19 (×2): 2 g via INTRAVENOUS
  Filled 2020-07-18 (×2): qty 2

## 2020-07-18 MED ORDER — LORAZEPAM 2 MG/ML IJ SOLN
INTRAMUSCULAR | Status: AC
  Administered 2020-07-18: 2 mg via INTRAVENOUS
  Filled 2020-07-18: qty 1

## 2020-07-18 MED ORDER — SODIUM CHLORIDE 0.9 % IV SOLN
250.0000 mL | INTRAVENOUS | Status: DC
  Administered 2020-07-18: 1000 mL via INTRAVENOUS
  Administered 2020-07-18 (×2): 250 mL via INTRAVENOUS

## 2020-07-18 MED ORDER — FENTANYL CITRATE (PF) 100 MCG/2ML IJ SOLN: 100.0000 ug | Freq: Once | INTRAMUSCULAR | Status: AC

## 2020-07-18 MED ORDER — NOREPINEPHRINE 4 MG/250ML-% IV SOLN
2.0000 ug/min | INTRAVENOUS | Status: DC
  Administered 2020-07-18: 12 ug/min via INTRAVENOUS
  Administered 2020-07-18: 14 ug/min via INTRAVENOUS
  Administered 2020-07-18: 10 ug/min via INTRAVENOUS
  Filled 2020-07-18: qty 500

## 2020-07-18 MED ORDER — VANCOMYCIN HCL 2000 MG/400ML IV SOLN
2000.0000 mg | Freq: Once | INTRAVENOUS | Status: AC
  Administered 2020-07-18: 2000 mg via INTRAVENOUS
  Filled 2020-07-18: qty 400

## 2020-07-18 MED ORDER — LACTATED RINGERS IV BOLUS
250.0000 mL | Freq: Once | INTRAVENOUS | Status: AC
  Administered 2020-07-18: 250 mL via INTRAVENOUS

## 2020-07-18 MED ORDER — LACTATED RINGERS IV SOLN: INTRAVENOUS | Status: DC

## 2020-07-18 MED ORDER — PANTOPRAZOLE SODIUM 40 MG IV SOLR
40.0000 mg | INTRAVENOUS | Status: DC
  Administered 2020-07-19 – 2020-07-24 (×6): 40 mg via INTRAVENOUS
  Filled 2020-07-18 (×6): qty 40

## 2020-07-18 MED ORDER — CHLORHEXIDINE GLUCONATE CLOTH 2 % EX PADS
6.0000 | MEDICATED_PAD | Freq: Every day | CUTANEOUS | Status: DC
  Administered 2020-07-19 – 2020-08-05 (×18): 6 via TOPICAL

## 2020-07-18 NOTE — Progress Notes (Signed)
Pharmacy Antibiotic Note   Alex [name] is a 40 y.o. male admitted on 07/18/2020 with altered mental status.  Pharmacy has been consulted for Vancomycin dosing for rule out sepsis. WBC mildly elevated. Acute renal failure.   Plan: Vancomycin 2000 mg IV x 1, further dosing based on Scr trend Cefepime 2g IV x 1 given in the ED, f/u additional gram negative coverage Trend WBC, temp, renal function  F/U infectious work-up Drug levels as indicated   Temp (24hrs), Avg:97.3 F (36.3 C), Min:95.7 F (35.4 C), Max:99 F (37.2 C)  Recent Labs  Lab 07/18/20 0200 07/18/20 0311  WBC 10.9*  --   CREATININE 5.13*  --   LATICACIDVEN  --  7.6*    CrCl cannot be calculated (Unknown ideal weight.).    No Known Allergies  Narda Bonds, PharmD, BCPS Clinical Pharmacist Phone: [phone]

## 2020-07-18 NOTE — ED Notes (Signed)
Date and time results received: 07/18/20 &1520  Test: Lactic acid  Critical Value: 4.1  Name of Provider Notified: Dr, Roderic Palau  Orders Received? Or Actions Taken?: notified

## 2020-07-18 NOTE — ED Notes (Signed)
Bair Hugger removed 

## 2020-07-18 NOTE — ED Notes (Signed)
[facility] arrived. Mother aware of room for pt at [facility] [place].

## 2020-07-18 NOTE — ED Triage Notes (Signed)
Ems states pt has been altered x 4 hours. Pt is belligerent and combative on arrival.

## 2020-07-18 NOTE — ED Notes (Signed)
Ultrasound in room and took pt out of trendelenburg position, nurse noted decrease in bp. Ultrasound stated she will be done in just a few minutes.

## 2020-07-18 NOTE — Progress Notes (Addendum)
[facility] Progress Note Patient Name: Alex [name] DOB: Dec 15, 1980 MRN: [id number]   Date of Service  07/18/2020  HPI/Events of Note  RN called to let me know that patient has a MAP of 62. Seen on camera. HR is 93 on room air, no distress, asleep. MAP is 62 since he has lower diastolic Bps, systolic is 038. He came to [facility] with peripheral levophed infusing at 15 mic/min, this is now down to 10 mic/min. Patient remains oliguric and has put out only 60 cc since arrival per RN. He arrived at [facility] on day shift. Has been given around 5 liter fluids.   eICU Interventions  Check CBC BMP and lactate now LR bolus x 1  May end up needing a central line if pressor requirements go up. This current BP is acceptable. D/w RN and asked we be notified when labs result     Intervention Category Major Interventions: Shock - evaluation and management  [name] 07/18/2020, 8:24 PM   11 pm - Labs back and LA improving  Due to continued need for pressors and possibly requiring higher doses, bedside CCM notified for central line placement as well  He is growing GNR in blood. I saw imaging did not show obstructive kidneys but on labs, I see elevated Alk phos and bili. While the CT did not show biliary dilatation, he does have gall stones and will need dedicated right Upper quadrant sono in AM. For now, will focus on stabilizing hemodynamics. Worsening AKI noted with normal K/bicarb, follow AM labs and will need renal to consult as well.   11:50 pm CVC ok to use, X ray has been read by radiology Levophed changed to central line order, also code status entered as Full (was not entered earlier)

## 2020-07-18 NOTE — ED Notes (Signed)
Date and time results received: 07/18/20 & 0825   Test: Lactic acid  Critical Value: 6.1   Name of Provider Notified: Dr. Gilford Raid  Orders Received? Or Actions Taken?: Notified

## 2020-07-18 NOTE — ED Notes (Signed)
5fench temp foley catheter inserted, urine specimen obtained.

## 2020-07-18 NOTE — ED Notes (Signed)
Urine output reported to Dr. Florina Ou.

## 2020-07-18 NOTE — ED Provider Notes (Addendum)
[place] DEPT [facility] Provider Note: Georgena Spurling, MD, FACEP  CSN: [id number] MRN: [id number] ARRIVAL: 07/18/20 at [place]: [id number]/APA07   CHIEF COMPLAINT  Altered Mental Status  Level 5 caveat: Altered mental status HISTORY OF PRESENT ILLNESS  07/18/20 1:52 AM Alex [name] is a 40 y.o. male with a history of heroin and methamphetamine abuse, currently on Suboxone (this was confirmed with the [facility]).  He is here with altered mental status.  Specifically he has had been more somnolent than usual but when stimulated becomes violent and verbally abusive, shouting obscenities and threats.  Reportedly he had been complaining of back pain earlier and has had difficulty urinating.  He has also reportedly been "sick" for the past week with fever.    Past Medical History:  Diagnosis Date  . Ruptured lumbar disc     History reviewed. No pertinent surgical history.  History reviewed. No pertinent family history.  Social History   Tobacco Use  . Smoking status: Current Every Day Smoker    Packs/day: 1.00    Types: Cigarettes  . Smokeless tobacco: Never Used  Vaping Use  . Vaping Use: Never used  Substance Use Topics  . Alcohol use: No  . Drug use: Yes    Types: Marijuana    Comment: denies use 08/24/17    Prior to Admission medications   Medication Sig Start Date End Date Taking? Authorizing Provider  buprenorphine-naloxone (SUBOXONE) 8-2 mg SUBL SL tablet Place 1 tablet under the tongue 2 (two) times daily. May place additional 1/2 tablet under the tongue daily as needed 06/30/20 07/30/20 Yes [provider]  lisinopril-hydrochlorothiazide (ZESTORETIC) 10-12.5 MG tablet Take 1 tablet by mouth daily. 11/12/19  Yes [provider]  naloxone (NARCAN) nasal spray 4 mg/0.1 mL Place 1 spray into the nose once. As needed for up to 1 dose. 01/16/18   [provider]    Allergies Patient has no known allergies.   REVIEW OF SYSTEMS  Cannot  assess due to altered mental status.   PHYSICAL EXAMINATION  Initial Vital Signs Blood pressure 108/82, pulse (!) 114, resp. rate (!) 24, SpO2 96 %.  Examination General: Well-developed, well-nourished male in no acute distress; appearance consistent with age of record HENT: normocephalic; atraumatic; dry mucous membranes Eyes: pupils equal, round and reactive to light Neck: supple Heart: regular rate and rhythm; tachycardia Lungs: clear to auscultation bilaterally Abdomen: soft; nondistended; diffusely tender; bowel sounds present Extremities: No deformity; full range of motion; pulses normal Neurologic: Moves all extremities; lethargic but readily aroused by stimuli Skin: Warm and dry; pale Psychiatric: Agitated and verbally abusive when stimulated   RESULTS  Summary of this visit's results, reviewed and interpreted by myself:   EKG Interpretation  Date/Time:    Ventricular Rate:    PR Interval:    QRS Duration:   QT Interval:    QTC Calculation:   R Axis:     Text Interpretation:        Laboratory Studies: Results for orders placed or performed during the hospital encounter of 07/18/20 (from the past 24 hour(s))  CBC with Differential/Platelet     Status: Abnormal   Collection Time: 07/18/20  2:00 AM  Result Value Ref Range   WBC 10.9 (H) 4.0 - 10.5 K/uL   RBC 4.71 4.22 - 5.81 MIL/uL   Hemoglobin 13.1 13.0 - 17.0 g/dL   HCT 40.8 39.0 - 52.0 %   MCV 86.6 80.0 - 100.0 fL   MCH 27.8 26.0 -  34.0 pg   MCHC 32.1 30.0 - 36.0 g/dL   RDW 15.1 11.5 - 15.5 %   Platelets 49 (L) 150 - 400 K/uL   nRBC 0.0 0.0 - 0.2 %   Neutrophils Relative % 86 %   Neutro Abs 9.4 (H) 1.7 - 7.7 K/uL   Lymphocytes Relative 6 %   Lymphs Abs 0.6 (L) 0.7 - 4.0 K/uL   Monocytes Relative 5 %   Monocytes Absolute 0.6 0.1 - 1.0 K/uL   Eosinophils Relative 0 %   Eosinophils Absolute 0.0 0.0 - 0.5 K/uL   Basophils Relative 0 %   Basophils Absolute 0.0 0.0 - 0.1 K/uL   WBC Morphology INCREASED  BANDS (>20% BANDS)    Immature Granulocytes 3 %   Abs Immature Granulocytes 0.29 (H) 0.00 - 0.07 K/uL  Comprehensive metabolic panel     Status: Abnormal   Collection Time: 07/18/20  2:00 AM  Result Value Ref Range   Sodium 132 (L) 135 - 145 mmol/L   Potassium 4.9 3.5 - 5.1 mmol/L   Chloride 89 (L) 98 - 111 mmol/L   CO2 20 (L) 22 - 32 mmol/L   Glucose, Bld 62 (L) 70 - 99 mg/dL   BUN 59 (H) 6 - 20 mg/dL   Creatinine, Ser 5.13 (H) 0.61 - 1.24 mg/dL   Calcium 9.2 8.9 - 10.3 mg/dL   Total Protein 7.7 6.5 - 8.1 g/dL   Albumin 2.5 (L) 3.5 - 5.0 g/dL   AST 63 (H) 15 - 41 U/L   ALT 32 0 - 44 U/L   Alkaline Phosphatase 527 (H) 38 - 126 U/L   Total Bilirubin 5.6 (H) 0.3 - 1.2 mg/dL   GFR, Estimated 14 (L) >60 mL/min   Anion gap 23 (H) 5 - 15  Ethanol     Status: None   Collection Time: 07/18/20  2:00 AM  Result Value Ref Range   Alcohol, Ethyl (B) <10 <10 mg/dL  CBG monitoring, ED     Status: Abnormal   Collection Time: 07/18/20  3:04 AM  Result Value Ref Range   Glucose-Capillary 64 (L) 70 - 99 mg/dL  Lactic acid, plasma     Status: Abnormal   Collection Time: 07/18/20  3:11 AM  Result Value Ref Range   Lactic Acid, Venous 7.6 (HH) 0.5 - 1.9 mmol/L  Rapid urine drug screen (hospital performed)     Status: Abnormal   Collection Time: 07/18/20  3:17 AM  Result Value Ref Range   Opiates NONE DETECTED NONE DETECTED   Cocaine NONE DETECTED NONE DETECTED   Benzodiazepines NONE DETECTED NONE DETECTED   Amphetamines POSITIVE (A) NONE DETECTED   Tetrahydrocannabinol NONE DETECTED NONE DETECTED   Barbiturates NONE DETECTED NONE DETECTED  Ammonia     Status: None   Collection Time: 07/18/20  3:17 AM  Result Value Ref Range   Ammonia 22 9 - 35 umol/L  Resp Panel by RT-PCR (Flu A&B, Covid) Nasopharyngeal Swab     Status: None   Collection Time: 07/18/20  3:20 AM   Specimen: Nasopharyngeal Swab; Nasopharyngeal(NP) swabs in vial transport medium  Result Value Ref Range   SARS Coronavirus 2  by RT PCR NEGATIVE NEGATIVE   Influenza A by PCR NEGATIVE NEGATIVE   Influenza B by PCR NEGATIVE NEGATIVE  CK     Status: None   Collection Time: 07/18/20  3:30 AM  Result Value Ref Range   Total CK 79 49 - 397 U/L  Blood gas, venous  Status: Abnormal   Collection Time: 07/18/20  3:30 AM  Result Value Ref Range   FIO2 21.00    pH, Ven 7.316 7.250 - 7.430   pCO2, Ven 39.1 (L) 44.0 - 60.0 mmHg   pO2, Ven 48.0 (H) 32.0 - 45.0 mmHg   Bicarbonate 19.3 (L) 20.0 - 28.0 mmol/L   Acid-base deficit 5.5 (H) 0.0 - 2.0 mmol/L   O2 Saturation 79.7 %   Patient temperature 35.7    Collection site VENOUS   CBG monitoring, ED     Status: None   Collection Time: 07/18/20  5:49 AM  Result Value Ref Range   Glucose-Capillary 93 70 - 99 mg/dL   Imaging Studies: CT ABDOMEN PELVIS WO CONTRAST  Result Date: 07/18/2020 CLINICAL DATA:  Acute, nonlocalized abdominal pain, acute renal failure, leukocytosis EXAM: CT ABDOMEN AND PELVIS WITHOUT CONTRAST TECHNIQUE: Multidetector CT imaging of the abdomen and pelvis was performed following the standard protocol without IV contrast. COMPARISON:  None. FINDINGS: Lower chest: Mild bibasilar atelectasis. The visualized heart and pericardium are unremarkable. Hepatobiliary: Cholelithiasis without pericholecystic inflammatory change. Mild hepatomegaly with the liver measuring 25 cm in craniocaudal dimension. Liver otherwise unremarkable. No intra or extrahepatic biliary ductal dilation. Pancreas: Unremarkable Spleen: Moderate splenomegaly with the spleen measuring up to 16.7 cm in greatest dimension. No definite intra splenic lesion identified on this noncontrast examination. Adrenals/Urinary Tract: The adrenal glands are unremarkable. The kidneys are normal in size and position. 2 mm nonobstructing calculus within the lower pole of the left kidney. The kidneys are otherwise unremarkable. Foley catheter balloon is seen within the decompressed bladder lumen. Stomach/Bowel:  Stomach is within normal limits. Appendix appears normal. No evidence of bowel wall thickening, distention, or inflammatory changes. No free intraperitoneal gas or fluid. Vascular/Lymphatic: No significant vascular findings are present. No enlarged abdominal or pelvic lymph nodes. Reproductive: Prostate is unremarkable. Other: No abdominal wall hernia.  Rectum unremarkable. Musculoskeletal: No lytic or blastic bone lesion. No acute bone abnormality. IMPRESSION: Mild to moderate hepatosplenomegaly. Cholelithiasis without superimposed inflammatory change. Minimal left nonobstructing nephrolithiasis. Electronically Signed   By: Fidela Salisbury MD   On: 07/18/2020 05:53   CT Head Wo Contrast  Result Date: 07/18/2020 CLINICAL DATA:  Delirium, acute renal failure, leukocytosis EXAM: CT HEAD WITHOUT CONTRAST TECHNIQUE: Contiguous axial images were obtained from the base of the skull through the vertex without intravenous contrast. COMPARISON:  None. FINDINGS: Brain: Normal anatomic configuration. No abnormal intra or extra-axial mass lesion or fluid collection. No abnormal mass effect or midline shift. No evidence of acute intracranial hemorrhage or infarct. Ventricular size is normal. Cerebellum unremarkable. Vascular: No hyperdense vessel or unexpected calcification. Skull: Intact Sinuses/Orbits: Paranasal sinuses are clear. Orbits are unremarkable. Other: Mastoid air cells and middle ear cavities are clear. IMPRESSION: No acute intracranial abnormality. Electronically Signed   By: Fidela Salisbury MD   On: 07/18/2020 05:47   DG Chest Portable 1 View  Result Date: 07/18/2020 CLINICAL DATA:  Fever EXAM: PORTABLE CHEST 1 VIEW COMPARISON:  None. FINDINGS: Lung volumes are small, but are symmetric and are clear. No pneumothorax or pleural effusion. Cardiac size within normal limits. Pulmonary vascularity is normal. No acute bone abnormality. IMPRESSION: Pulmonary hypoinflation. Electronically Signed   By: Fidela Salisbury  MD   On: 07/18/2020 04:09    ED COURSE and MDM  Nursing notes, initial and subsequent vitals signs, including pulse oximetry, reviewed and interpreted by myself.  Vitals:   07/18/20 0630 07/18/20 0635 07/18/20 0640 07/18/20 0645  BP: Marland Kitchen)  95/45 (!) 96/44 (!) 93/48 (!) 104/51  Pulse: 99 97 98 95  Resp: (!) 23 (!) 24 (!) 23 (!) 23  Temp:      TempSrc:      SpO2: 97% 96% 96% 97%   Medications  0.9 %  sodium chloride infusion (250 mLs Intravenous New Bag/Given 07/18/20 0435)  norepinephrine (LEVOPHED) '4mg'$  in 230m premix infusion (10 mcg/min Intravenous Infusion Verify 07/18/20 0641)  ziprasidone (GEODON) injection 20 mg (20 mg Intramuscular Given 07/18/20 0148)  sodium chloride 0.9 % bolus 1,000 mL (0 mLs Intravenous Stopped 07/18/20 0245)  sodium chloride 0.9 % bolus 1,000 mL (0 mLs Intravenous Stopped 07/18/20 0349)  dextrose 50 % solution 50 mL (50 mLs Intravenous Given 07/18/20 0315)  lactated ringers bolus 1,000 mL (0 mLs Intravenous Stopped 07/18/20 0425)  ceFEPIme (MAXIPIME) 2 g in sodium chloride 0.9 % 100 mL IVPB (0 g Intravenous Stopped 07/18/20 0553)  vancomycin (VANCOREADY) IVPB 2000 mg/400 mL (2,000 mg Intravenous New Bag/Given 07/18/20 0429)  lactated ringers bolus 1,000 mL (0 mLs Intravenous Stopped 07/18/20 0432)   1:54 AM Patient more cooperative and somnolent after Geodon IM.  3:09 AM Patient has become hypotensive.  A second liter of normal saline was initiated.  His sugar is noted to be 62 we will give D50.  He is creatinine is over 5 and this represents a significant change [facility] It is unclear if he is septic and a drug screen is pending.  3:54 AM Blood pressure improved with hydration.  Blood cultures and lactate sent.  We will go ahead and start antibiotics for possible sepsis.  Patient mildly hypothermic now and will start external warming measures.  He was not hypothermic on arrival.  4:30 AM Will start  pressors for persistent hypotension despite fluid resuscitation.  6:21 AM Discussed with Dr. IMilon Dikes(pulmonary/critical care) who [facility]  Sources of infection is unclear as chest x-ray and abdominal CT are negative.  Patient does have a history of drug abuse of may be related to that.  Urinalysis not done due to patient's oliguria.  7:04 AM Pressure improved on Levophed drip.  Patient is put out about 50 mL of dark brown urine, will send for urinalysis.  Although urine is dark CK is not consistent with rhabdomyolysis.  PROCEDURES  Procedures CRITICAL CARE Performed by: JKaren ChafeMolpus Total critical care time: 90 minutes Critical care time was exclusive of separately billable procedures and treating other patients. Critical care was necessary to treat or prevent imminent or life-threatening deterioration. Critical care was time spent personally by me on the following activities: development of treatment plan with patient and/or surrogate as well as nursing, discussions with consultants, evaluation of patient's response to treatment, examination of patient, obtaining history from patient or surrogate, ordering and performing treatments and interventions, ordering and review of laboratory studies, ordering and review of radiographic studies, pulse oximetry and re-evaluation of patient's condition.   ED DIAGNOSES     ICD-10-CM   1. 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 Elevated LFTs  R79.89   4. Acute renal failure with oliguria (HCC)  N17.9    R34  5. Thrombocytopenia ([facility])  D69.6        Jovann Luse, MD 07/18/20 [id number]    Shanon Rosser, MD 07/18/20 2242    Shanon Rosser, MD 07/19/20 [phone]

## 2020-07-18 NOTE — ED Provider Notes (Signed)
Pt signed out by Dr. Florina Ou as an admission to ICU at [facility].  Pt has a hx of polysubstance abuse with AMS.  He was found to be possibly septic with ARF, elevated LFTs, elevated lactic acid.  He is on levophed.  No source of sepsis found as of yet.  There was a thought that pt could stay here.  However, pt was d/w Dr. Halford Chessman (CCM) who felt that pt needed to go to [facility] Ed, but there are still no beds in the ICU there.  He requested that we do an ED to ED transfer and for the ED at [facility] to call the ICU attending when pt arrives.  He will notify the [facility] ICU attending of this plan.  Pt d/w Dr. Kathrynn Humble who will accept pt for transfer to [facility] ED.   Isla Pence, MD 07/18/20 [phone]

## 2020-07-18 NOTE — ED Notes (Signed)
Attempted I and O cath, no urine returned, will attempt later after IV fluids completed.

## 2020-07-18 NOTE — Consult Note (Addendum)
NAME:  Alex [name], MRN:  [id number], DOB:  Mar 15, 1981, LOS: 0 ADMISSION DATE:  07/18/2020, CONSULTATION DATE:  4/26 REFERRING MD:  Dr. Florina Ou, CHIEF COMPLAINT:  AMS   History of Present Illness:  40 yo male smoker brought to [facility] ER with altered mental status.  Noted to have headache and feeling feverish before admission.  Found to have hypotension, AKI, thrombocytopenia, splenomegaly, lactic acidosis, elevated bilirubin, hypoglycemia.  Started on antibiotics and pressors for possible sepsis, but no clear source of infection.  Normal renal function from February 2022.  Uses suboxone for history o substance abuse.  UDS positive for amphetamines.  Transfer to [facility] for further therapy.  Hx from chart, medical team, and pt's mother.  Pertinent  Medical History  Polysubstance Abuse - amphetamines, heroin. On Suboxone.   Ruptured Disc  Significant Hospital Events: Including procedures, antibiotic start and stop dates in addition to other pertinent events   . 4/26 transfer from [facility] to [facility]  Interim History / Subjective:    Objective   Blood pressure (!) 103/39, pulse (!) 105, temperature 98.2 F (36.8 C), resp. rate (!) 31, height '6\' 4"'$  (1.93 m), weight 127.1 kg, SpO2 96 %.        Intake/Output Summary (Last 24 hours) at 07/18/2020 1207 Last data filed at 07/18/2020 [id number] Gross per 24 hour  Intake 508.35 ml  Output 50 ml  Net 458.35 ml   Filed Weights   07/18/20 0856  Weight: 127.1 kg    Examination:  General - in trendelenburg position in ER, moaning intermittently Eyes - pupils reactive, jaundice ENT - no sinus tenderness, no stridor Cardiac - regular, tachycardic Chest - equal breath sounds b/l, no wheezing or rales Abdomen - soft, decreased bowel sounds Extremities - no cyanosis, clubbing, or edema, in 4 point restraints Skin - no rashes Neuro - not following commands, grimaces with stimulation   Labs/imaging that I havepersonally reviewed  (right click and  "Reselect all SmartList Selections" daily)  CT head 4/26 >> no acute findings CT abd/pelvis 4/26 >> cholelithiasis w/o inflammatory chagnes, mild hepatomegaly (25 cm), moderate splenomegaly (16.7 cm), 2 mm non obstructing kidney stone on Lt  CMP Latest Ref Rng & Units 07/18/2020 07/18/2020 03/10/2012  Glucose 70 - 99 mg/dL 69(L) 62(L) 88  BUN 6 - 20 mg/dL 60(H) 59(H) 10  Creatinine 0.61 - 1.24 mg/dL 4.83(H) 5.13(H) 0.84  Sodium 135 - 145 mmol/L 134(L) 132(L) 139  Potassium 3.5 - 5.1 mmol/L 4.1 4.9 4.1  Chloride 98 - 111 mmol/L 99 89(L) 98  CO2 22 - 32 mmol/L 18(L) 20(L) 32  Calcium 8.9 - 10.3 mg/dL 8.2(L) 9.2 9.8  Total Protein 6.5 - 8.1 g/dL 5.8(L) 7.7 -  Total Bilirubin 0.3 - 1.2 mg/dL 5.4(H) 5.6(H) -  Alkaline Phos 38 - 126 U/L 593(H) 527(H) -  AST 15 - 41 U/L 53(H) 63(H) -  ALT 0 - 44 U/L 24 32 -    CBC Latest Ref Rng & Units 07/18/2020 07/18/2020 03/10/2012  WBC 4.0 - 10.5 K/uL 10.3 10.9(H) 16.8(H)  Hemoglobin 13.0 - 17.0 g/dL 11.3(L) 13.1 16.0  Hematocrit 39.0 - 52.0 % 35.7(L) 40.8 46.5  Platelets 150 - 400 K/uL 31(L) 49(L) 263    Resolved Hospital Problem list      Assessment & Plan:   Altered mental status with anemia, thrombocytopenia, lactic acidosis, AKI, elevated bilirubin, splenomegaly, hypotension, and hypoglycemia. - concern for sepsis, but no clear source - also concern for MAHA - continue ABx with vancomycin,  cefepime - f/u lactic acid and procalcitonin - check DIC panel, LDH - check anemia panel - f/u CMET, CBC - continue IV fluids - pressors to keep MAP > 65 - check TSH, cortisol - check ABG, f/u chest xray - aspiration, fall precautions  D/w Dr. Gilford Raid and Dr. Manuella Ghazi.  Updated pt's mother at bedside in ER.  Best practice (right click and "Reselect all SmartList Selections" daily)  Diet:  NPO Pain/Anxiety/Delirium protocol (if indicated): No VAP protocol (if indicated): Not indicated DVT prophylaxis: SCD GI prophylaxis: PPI Glucose control:  SSI  No Central venous access:  N/A Arterial line:  N/A Foley:  Yes, and it is still needed Mobility:  bed rest  PT consulted: N/A Last date of multidisciplinary goals of care discussion '[]'$  Code Status:  full code Disposition: ICU  Review of Systems:   Unable to obtain.  Past Medical History:  He,  has a past medical history of Ruptured lumbar disc.   Surgical History:  History reviewed. No pertinent surgical history.   Social History:   reports that he has been smoking cigarettes. He has been smoking about 1.00 pack per day. He has never used smokeless tobacco. He reports current drug use. Drug: Marijuana. He reports that he does not drink alcohol.   Family History:  His family history is negative for TTP.   Allergies No Known Allergies   Home Medications  Prior to Admission medications   Medication Sig Start Date End Date Taking? Authorizing Provider  buprenorphine-naloxone (SUBOXONE) 8-2 mg SUBL SL tablet Place 1 tablet under the tongue 2 (two) times daily. May place additional 1/2 tablet under the tongue daily as needed 06/30/20 07/30/20 Yes [provider]  lisinopril-hydrochlorothiazide (ZESTORETIC) 10-12.5 MG tablet Take 1 tablet by mouth daily. 11/12/19  Yes [provider]  cyclobenzaprine (FLEXERIL) 10 MG tablet Take 1 tablet (10 mg total) by mouth 3 (three) times daily as needed. Patient not taking: Reported on 07/18/2020 06/09/18   Triplett, Tammy, PA-C  diazepam (VALIUM) 5 MG tablet Take 1 tablet (5 mg total) by mouth 2 (two) times daily. Patient not taking: Reported on 07/18/2020 06/09/18   Triplett, Lynelle Smoke, PA-C  ibuprofen (ADVIL,MOTRIN) 800 MG tablet Take 1 tablet (800 mg total) by mouth 3 (three) times daily. Patient not taking: Reported on 07/18/2020 06/09/18   Kem Parkinson, PA-C  naloxone [facility]) nasal spray 4 mg/0.1 mL Place 1 spray into the nose once. As needed for up to 1 dose. 01/16/18   [provider]  predniSONE (DELTASONE) 50 MG tablet  1 tablet PO QD X5 days Patient not taking: Reported on 07/18/2020 01/10/20   Ripley Fraise, MD     Critical care time: 42 minutes  Chesley Mires, MD [place] Pager - [phone] 07/18/2020, 2:08 PM

## 2020-07-18 NOTE — Progress Notes (Signed)
This Rn spoke with pt. Mother Nahome Hillier) to clarify code status for pt. Mother informed that she wants FULL Code status for son, wanting/desiring all life saving measures if pt were to decline and required life saving measures. This RN informed E-link of this information. This RN also informed mother of MD placing R subclavian CVC line. Mother is aware and this Rn answered all questions within his scope of practice.

## 2020-07-18 NOTE — Progress Notes (Signed)
Pharmacy Antibiotic Note  Alex [name] is a 40 y.o. male admitted on 07/18/2020 with sepsis.  Pharmacy has been consulted for Vancomycin and cefepime dosing.  Plan: Vancomycin 1750 mg IV every 48 hours. Cefepime 2000 mg IV every 12 hours. Monitor labs, c/s, and vanco level as indicated.  Height: '6\' 4"'$  (193 cm) Weight: 127.1 kg (280 lb 1.6 oz) IBW/kg (Calculated) : 86.8  Temp (24hrs), Avg:97.3 F (36.3 C), Min:95.7 F (35.4 C), Max:99 F (37.2 C)  Recent Labs  Lab 07/18/20 0200 07/18/20 0311 07/18/20 0450 07/18/20 1135  WBC 10.9*  --   --  10.3  CREATININE 5.13*  --   --  4.83*  LATICACIDVEN  --  7.6* 6.1* 4.8*    Estimated Creatinine Clearance: 30.5 mL/min (A) (by C-G formula based on SCr of 4.83 mg/dL (H)).    No Known Allergies  Antimicrobials this admission: Vanco 4/26 >>  Cefepime 4/26 >>    Microbiology results: 4/26 BCx: pending 4/26 UCx: pending  4/26 MRSA PCR: pending  Thank you for allowing pharmacy to be a part of this patient's care.  Ramond Craver 07/18/2020 1:48 PM

## 2020-07-18 NOTE — ED Notes (Signed)
Attempted contacting on call Dr for lactic acid results several times.

## 2020-07-18 NOTE — Procedures (Signed)
Name:  Alex [name] MRN:  [id number] DOB:  Aug 22, 1980  PROCEDURE NOTE  Procedure:  Central venous catheter placement.  Indications:  Need for intravenous access and hemodynamic monitoring.  Consent:  Consent was implied due to the emergency nature of the procedure.  Anesthesia:  A total of 10 mL of 1% Lidocaine was used for local infiltration anesthesia.  Procedure summary:  Appropriate equipment was assembled.  The patient was identified as [name] and safety timeout was performed. The patient was placed in Trendelenburg position.  Sterile technique was used. The patient's right anterior chest wall was prepped using chlorhexidine / alcohol scrub and the field was draped in usual sterile fashion with full body drape. After the adequate anesthesia was achieved, the right subclavian vein was cannulated with the introducer needle without difficulty. A guide wire was advanced through the introducer needle, which was then withdrawn. A small skin incision was made at the point of wire entry, the dilator was inserted over the guide wire and appropriate dilation was obtained. The dilator was removed and 7 French triple-lumen catheter was advanced over the guide wire, which was then removed.  All ports were aspirated and flushed with normal saline without difficulty. The catheter was secured into place at 18 cm. Antibiotic patch was placed and sterile dressing was applied. Post-procedure chest x-ray was ordered.  Complications:  No immediate complications were noted.  Hemodynamic parameters and oxygenation remained stable throughout the procedure.  Estimated blood loss:  Less then 5 mL.   07/18/2020, 11:29 PM

## 2020-07-18 NOTE — ED Notes (Signed)
Attempted to call [facility] ED to give report. No one answered the phone. [phone]

## 2020-07-18 NOTE — Consult Note (Signed)
This is a 40 year old male with history of polysubstance abuse, chronic pain, and hypertension. He arrived to ED via EMS with 4-hour history of altered mentation.  He is noted to be more somnolent than usual and was also complaining of back pain as well as difficulty urinating.  He was thought to have sepsis and was started on IV fluid and antibiotics and developed significant hypotension that was unresponsive to IV fluid for which he was started on norepinephrine.  He was noted to have significant creatinine elevation over 5 and his CK level was negative.  He was noted to have significant thrombocytopenia and lactic acidosis.  COVID testing and influenza swab were negative.  Patient was seen at [facility] by PCCM for further evaluation as he was originally slated for transfer to [facility] service at [facility], but beds were not readily available.  He now appears to possibly have TTP/DIC in the setting of his altered mentation with anemia, thrombocytopenia, AKI, elevated bilirubin, lactic acidosis, hypertension, splenomegaly, and hypoglycemia.  Since able does not appear to be immediately available, he will be transferred from ED to ED for more emergent evaluation and treatment.  Plan at this time is to immediately transfer to [facility]. Patient continue on vancomycin and cefepime empirically.  Further lab work as ordered by [employer].  He will continue to remain on pressors as well as IV fluids.  Total care time: 45 minutes

## 2020-07-18 NOTE — Plan of Care (Signed)
  Problem: Safety: Goal: Violent Restraint(s) Outcome: Not Progressing   Problem: Safety: Goal: Non-violent Restraint(s) Outcome: Not Progressing

## 2020-07-18 NOTE — ED Notes (Signed)
Bair hugger applied.

## 2020-07-19 ENCOUNTER — Inpatient Hospital Stay (HOSPITAL_COMMUNITY): Payer: 59

## 2020-07-19 DIAGNOSIS — R6521 Severe sepsis with septic shock: Secondary | ICD-10-CM

## 2020-07-19 DIAGNOSIS — A419 Sepsis, unspecified organism: Secondary | ICD-10-CM | POA: Diagnosis not present

## 2020-07-19 DIAGNOSIS — R7989 Other specified abnormal findings of blood chemistry: Secondary | ICD-10-CM

## 2020-07-19 DIAGNOSIS — G9340 Encephalopathy, unspecified: Secondary | ICD-10-CM | POA: Diagnosis not present

## 2020-07-19 LAB — BLOOD GAS, ARTERIAL
Acid-base deficit: 3.8 mmol/L — ABNORMAL HIGH (ref 0.0–2.0)
Bicarbonate: 21.1 mmol/L (ref 20.0–28.0)
Drawn by: 60057
FIO2: 36
O2 Saturation: 96.3 %
Patient temperature: 38
pCO2 arterial: 42.2 mmHg (ref 32.0–48.0)
pH, Arterial: 7.325 — ABNORMAL LOW (ref 7.350–7.450)
pO2, Arterial: 95.6 mmHg (ref 83.0–108.0)

## 2020-07-19 LAB — COMPREHENSIVE METABOLIC PANEL
ALT: 23 U/L (ref 0–44)
AST: 51 U/L — ABNORMAL HIGH (ref 15–41)
Albumin: 1.6 g/dL — ABNORMAL LOW (ref 3.5–5.0)
Alkaline Phosphatase: 326 U/L — ABNORMAL HIGH (ref 38–126)
Anion gap: 16 — ABNORMAL HIGH (ref 5–15)
BUN: 76 mg/dL — ABNORMAL HIGH (ref 6–20)
CO2: 21 mmol/L — ABNORMAL LOW (ref 22–32)
Calcium: 8.5 mg/dL — ABNORMAL LOW (ref 8.9–10.3)
Chloride: 96 mmol/L — ABNORMAL LOW (ref 98–111)
Creatinine, Ser: 5.57 mg/dL — ABNORMAL HIGH (ref 0.61–1.24)
GFR, Estimated: 13 mL/min — ABNORMAL LOW (ref >60)
Glucose, Bld: 111 mg/dL — ABNORMAL HIGH (ref 70–99)
Potassium: 4.4 mmol/L (ref 3.5–5.1)
Sodium: 133 mmol/L — ABNORMAL LOW (ref 135–145)
Total Bilirubin: 7 mg/dL — ABNORMAL HIGH (ref 0.3–1.2)
Total Protein: 5.8 g/dL — ABNORMAL LOW (ref 6.5–8.1)

## 2020-07-19 LAB — BLOOD CULTURE ID PANEL (REFLEXED) - BCID2

## 2020-07-19 LAB — ECHOCARDIOGRAM COMPLETE
AR max vel: 4.21 cm2
AV Area VTI: 3.72 cm2
AV Area mean vel: 3.53 cm2
AV Mean grad: 8 mmHg
AV Peak grad: 13.8 mmHg
Ao pk vel: 1.86 m/s
Area-P 1/2: 5.13 cm2
Height: 76 in
Weight: 4592.62 oz

## 2020-07-19 LAB — LACTIC ACID, PLASMA
Lactic Acid, Venous: 4.8 mmol/L (ref 0.5–1.9)
Lactic Acid, Venous: 1.8 mmol/L (ref 0.5–1.9)

## 2020-07-19 LAB — CBC
HCT: 35.5 % — ABNORMAL LOW (ref 39.0–52.0)
Hemoglobin: 11.5 g/dL — ABNORMAL LOW (ref 13.0–17.0)
MCH: 27.6 pg (ref 26.0–34.0)
MCHC: 32.4 g/dL (ref 30.0–36.0)
MCV: 85.1 fL (ref 80.0–100.0)
Platelets: 32 10*3/uL — ABNORMAL LOW (ref 150–400)
RBC: 4.17 MIL/uL — ABNORMAL LOW (ref 4.22–5.81)
RDW: 15.6 % — ABNORMAL HIGH (ref 11.5–15.5)
WBC: 13.1 10*3/uL — ABNORMAL HIGH (ref 4.0–10.5)
nRBC: 0 % (ref 0.0–0.2)

## 2020-07-19 LAB — MAGNESIUM
Magnesium: 2.4 mg/dL (ref 1.7–2.4)
Magnesium: 2.6 mg/dL — ABNORMAL HIGH (ref 1.7–2.4)
Magnesium: 3 mg/dL — ABNORMAL HIGH (ref 1.7–2.4)

## 2020-07-19 LAB — GLUCOSE, CAPILLARY
Glucose-Capillary: 86 mg/dL (ref 70–99)
Glucose-Capillary: 112 mg/dL — ABNORMAL HIGH (ref 70–99)
Glucose-Capillary: 106 mg/dL — ABNORMAL HIGH (ref 70–99)
Glucose-Capillary: 97 mg/dL (ref 70–99)
Glucose-Capillary: 89 mg/dL (ref 70–99)
Glucose-Capillary: 96 mg/dL (ref 70–99)

## 2020-07-19 LAB — URINE CULTURE: Culture: <10000 — AB

## 2020-07-19 LAB — PHOSPHORUS
Phosphorus: 3.1 mg/dL (ref 2.5–4.6)
Phosphorus: 3.3 mg/dL (ref 2.5–4.6)
Phosphorus: 4.3 mg/dL (ref 2.5–4.6)

## 2020-07-19 LAB — PROCALCITONIN: Procalcitonin: 42.43 ng/mL

## 2020-07-19 LAB — HAPTOGLOBIN: Haptoglobin: 213 mg/dL (ref 17–317)

## 2020-07-19 MED ORDER — VANCOMYCIN VARIABLE DOSE PER UNSTABLE RENAL FUNCTION (PHARMACIST DOSING): Status: DC

## 2020-07-19 MED ORDER — PROSOURCE TF PO LIQD: 45.0000 mL | Freq: Two times a day (BID) | ORAL | Status: DC

## 2020-07-19 MED ORDER — SODIUM CHLORIDE 0.9 % IV SOLN
2.0000 g | INTRAVENOUS | Status: DC
  Administered 2020-07-20: 2 g via INTRAVENOUS
  Filled 2020-07-19: qty 2

## 2020-07-19 MED ORDER — SODIUM CHLORIDE 0.9% FLUSH: 10.0000 mL | INTRAVENOUS | Status: DC | PRN

## 2020-07-19 MED ORDER — SODIUM CHLORIDE 0.9 % IV SOLN: INTRAVENOUS | Status: DC

## 2020-07-19 MED ORDER — VASOPRESSIN 20 UNITS/100 ML INFUSION FOR SHOCK
0.0000 [IU]/min | INTRAVENOUS | Status: DC
  Administered 2020-07-19 (×2): 0.03 [IU]/min via INTRAVENOUS
  Administered 2020-07-20: 0.04 [IU]/min via INTRAVENOUS
  Administered 2020-07-20: 0.03 [IU]/min via INTRAVENOUS
  Administered 2020-07-21 – 2020-07-22 (×6): 0.04 [IU]/min via INTRAVENOUS
  Filled 2020-07-19 (×10): qty 100

## 2020-07-19 MED ORDER — ALBUMIN HUMAN 25 % IV SOLN
12.5000 g | Freq: Once | INTRAVENOUS | Status: AC
  Administered 2020-07-19: 12.5 g via INTRAVENOUS
  Filled 2020-07-19: qty 50

## 2020-07-19 MED ORDER — NOREPINEPHRINE 16 MG/250ML-% IV SOLN
0.0000 ug/min | INTRAVENOUS | Status: DC
  Administered 2020-07-19: 15 ug/min via INTRAVENOUS
  Administered 2020-07-20: 30 ug/min via INTRAVENOUS
  Administered 2020-07-20: 20 ug/min via INTRAVENOUS
  Administered 2020-07-21: 17 ug/min via INTRAVENOUS
  Administered 2020-07-21: 12 ug/min via INTRAVENOUS
  Administered 2020-07-24: 2 ug/min via INTRAVENOUS
  Filled 2020-07-19 (×8): qty 250

## 2020-07-19 MED ORDER — SODIUM CHLORIDE 0.9% FLUSH
10.0000 mL | Freq: Two times a day (BID) | INTRAVENOUS | Status: DC
  Administered 2020-07-19 – 2020-07-21 (×4): 10 mL
  Administered 2020-07-21: 20 mL
  Administered 2020-07-22 – 2020-07-23 (×3): 10 mL
  Administered 2020-07-24: 20 mL
  Administered 2020-07-24 – 2020-07-27 (×6): 10 mL
  Administered 2020-07-27: 20 mL
  Administered 2020-07-28 – 2020-08-01 (×10): 10 mL
  Administered 2020-08-02: 20 mL
  Administered 2020-08-02: 10 mL

## 2020-07-19 MED ORDER — VITAL HIGH PROTEIN PO LIQD: 1000.0000 mL | ORAL | Status: DC

## 2020-07-19 MED ORDER — PERFLUTREN LIPID MICROSPHERE
1.0000 mL | INTRAVENOUS | Status: AC | PRN
  Administered 2020-07-19: 4 mL via INTRAVENOUS
  Filled 2020-07-19: qty 10

## 2020-07-19 NOTE — Progress Notes (Addendum)
[place] Progress Note Patient Name: Alex [name] DOB: 03-13-1981 MRN: [id number]   Date of Service  07/19/2020  HPI/Events of Note  Cr going up > 5.5 K ok. UOP 150 only this shift. On Levo at 17 mcg. Zero out put this hour. S/p 5 lit fluids yesterday and 1 lit earlier in the shift with improvement in LA to 1.8  CT abd: IMPRESSION: Mild to moderate hepatosplenomegaly.  Cholelithiasis without superimposed inflammatory change.  Minimal left nonobstructing nephrolithiasis ATN/ AKI.Septic shock- GNR on blood cx. On abx. Stable anemia and thrombocytopenia. ECHO pending to report. Low albumin 1.8, drug abuser. Amphetamine +.  On 4 lit o2.  eICU Interventions  - Albumin bolus once.  Consider Nephrology consult in AM. co2 ok. Last ABG hco3 at 20 at noon on 26 th.  -follow ABG. Discussed with RN. MAP at 68.      Intervention Category Intermediate Interventions: Oliguria - evaluation and management;Hypotension - evaluation and management  Elmer Sow 07/19/2020, 2:58 AM

## 2020-07-19 NOTE — Progress Notes (Incomplete)
  Echocardiogram 2D Echocardiogram has been performed.  Alex [name] 07/19/2020, 10:42 AM

## 2020-07-19 NOTE — Progress Notes (Signed)
    [facility] has been requested to perform a transesophageal echocardiogram on [name] for bactermia.  After careful review of history and examination, the risks and benefits of transesophageal echocardiogram have been explained to the patients mother who will be signing consent given his encephalopathy. Risks including esophageal damage, perforation (1:10,000 risk), bleeding, pharyngeal hematoma as well as other potential complications associated with conscious sedation including aspiration, arrhythmia, respiratory failure and death were reviewed. Alternatives to treatment were discussed, questions were answered. Patient's family is willing to proceed.   Kathyrn Drown, NP  07/19/2020 4:01 PM

## 2020-07-19 NOTE — Progress Notes (Signed)
NAME:  Alex [name], MRN:  [id number], DOB:  09-13-1980, LOS: 1 ADMISSION DATE:  07/18/2020, CONSULTATION DATE:  4/26 REFERRING MD:  Dr. Florina Ou, CHIEF COMPLAINT:  AMS   History of Present Illness:  40 yo male smoker brought to [facility] ER with altered mental status.  Noted to have headache and feeling feverish before admission.  Found to have hypotension, AKI, thrombocytopenia, splenomegaly, lactic acidosis, elevated bilirubin, hypoglycemia.  Started on antibiotics and pressors for possible sepsis, but no clear source of infection.  Normal renal function from February 2022.  Uses suboxone for history of substance abuse.  UDS positive for amphetamines.  Transfer to [facility] for further therapy.  Hx from chart, medical team, and pt's mother.  Pertinent  Medical History  Polysubstance Abuse - amphetamines, heroin. On Suboxone.   Ruptured Disc  Significant Hospital Events: Including procedures, antibiotic start and stop dates in addition to other pertinent events   . 4/26 transfer from [facility] to [facility] . CT head 4/26 >> no acute findings . CT abd/pelvis 4/26 >> cholelithiasis w/o inflammatory chagnes, mild hepatomegaly (25 cm), moderate splenomegaly (16.7 cm), 2 mm non obstructing kidney stone on Lt . 4/27 CVL placed. Pressors escalating.   Interim History / Subjective:  cvl plaved overnight for worsening shock  Objective   Blood pressure (!) 115/48, pulse 85, temperature 100 F (37.8 C), resp. rate (!) 25, height '6\' 4"'$  (1.93 m), weight 130.2 kg, SpO2 94 %.        Intake/Output Summary (Last 24 hours) at 07/19/2020 0739 Last data filed at 07/19/2020 0700 Gross per 24 hour  Intake 3828.15 ml  Output 235 ml  Net 3593.15 ml   Filed Weights   07/18/20 0856 07/19/20 0400  Weight: 127.1 kg 130.2 kg    Examination:  General:  Adult male, overweight, moaning Neuro:  Lethargic, not following commands. Eyes open to touch. Non-purposeful movements.  HEENT:  [place]/AT, No JVD noted,  PERRL Cardiovascular:  Tachy, regular, no MRG Lungs:  Clear bilateral breath sounds, diminished bases Abdomen:  Soft, non-distended, non-tender.  Musculoskeletal:  No acute deformity.  Skin:  Intact, MMM  Labs/imaging that I havepersonally reviewed  (right click and "Reselect all SmartList Selections" daily)  CO2 21 Gap 16 Creatinine 5.57 BUN 76 Albumin 1.6  BCID with serratia marcescens  Resolved Hospital Problem list      Assessment & Plan:    Septic shock secondary to GNR bacteremia: Serratia on BCID - Cefepime per pharmacy - DC vancomycin - Echo pending - Levophed for MAP goal 65  Acute renal failure secondary to shock - s/p 5 L IVF - BP support - Follow BMP - Will likely need RRT if no improvement with pressors, antibiotics.   Acute metabolic encephalopathy: lethargy with intermittent agitation.  - supportive care - At risk for requiring intubation. - Pulmonary hygiene  Anemia Thrombocytopenia: most likely secondary to sepsis. No schistocytes seen on peripheral smear. LDH with very mild elevation and haptoglobin wnl.  - Transfuse for platelets less than 10 or active bleed - Trend CBC  Polysubstance abuse: suboxone, amphetamines - rehabilitation support if able to survive this admission.   Best practice (right click and "Reselect all SmartList Selections" daily)  Diet:  NPO Pain/Anxiety/Delirium protocol (if indicated): No VAP protocol (if indicated): Not indicated DVT prophylaxis: SCD GI prophylaxis: PPI Glucose control:  SSI No Central venous access:  N/A Arterial line:  N/A Foley:  Yes, and it is still needed Mobility:  bed rest  PT consulted: N/A  Last date of multidisciplinary goals of care discussion [attmempted to contact mother] Code Status:  full code Disposition: ICU   Critical care time: 42 minutes   Georgann Housekeeper, AGACNP-BC [place] for personal pager PCCM on call pager [phone] until  7pm. Please call Elink 7p-7a. [phone]  07/19/2020 8:00 AM

## 2020-07-19 NOTE — Progress Notes (Signed)
Brief Cortrak Team Note:   RD received consult for Cortrak placement. Pt in 4-point restraints on visit. As soon as RD attempted to begin Cortrak placement, pt became extremely agitated and combative, thrashing around in the bed. Nursing staff and Melynda Keller NP CCM at bedside. All in agreement to abort Cortrak placement at this time.   Cortrak tube left behind in patient room in the case the patient becomes appropriate for placement at another time.   Please re-consult RD if Cortrak appropriate  Kerman Passey MS, RDN, LDN, CNSC Registered Dietitian III Clinical Nutrition RD Pager and On-Call Pager Number Located in [place]

## 2020-07-19 NOTE — Progress Notes (Signed)
[place] Progress Note Patient Name: Alex [name] DOB: 14-Jan-1981 MRN: [id number]   Date of Service  07/19/2020  HPI/Events of Note  May you renew restraint order? Bilat wrist and ankle   eICU Interventions  Restraints renewed to prevent self injury and harm to self.      Intervention Category Minor Interventions: Other:  Elmer Sow 07/19/2020, 7:54 PM

## 2020-07-19 NOTE — Progress Notes (Addendum)
PHARMACY - PHYSICIAN COMMUNICATION CRITICAL VALUE ALERT - BLOOD CULTURE IDENTIFICATION (BCID)  Alex [name] is an 40 y.o. male who presented to [facility] on 07/18/2020 with a chief complaint of AMS, sepsis  Assessment:   Blood culture growing Serratia marcescens  Name of physician (or Provider) Contacted:  Rounding MD 4/27  Current antibiotics: Vancomycin and Cefepime   Changes to prescribed antibiotics recommended:  Consider narrowing antibiotics to Rocephin 2 g IV q24h  ADDENDUM: Recommend continuing cefepime Consider discontinuing vancomcyin F/u renal function, renal plans, cultures, clinical improvement  Fara Olden, PharmD PGY-1 Pharmacy Resident 07/19/2020 8:19 AM Please see AMION for all pharmacy numbers   Results for orders placed or performed during the hospital encounter of 07/18/20  Blood Culture ID Panel (Reflexed) (Collected: 07/18/2020  3:30 AM)  Result Value Ref Range   Enterococcus faecalis NOT DETECTED NOT DETECTED   Enterococcus Faecium NOT DETECTED NOT DETECTED   Listeria monocytogenes NOT DETECTED NOT DETECTED   Staphylococcus species NOT DETECTED NOT DETECTED   Staphylococcus aureus (BCID) NOT DETECTED NOT DETECTED   Staphylococcus epidermidis NOT DETECTED NOT DETECTED   Staphylococcus lugdunensis NOT DETECTED NOT DETECTED   Streptococcus species NOT DETECTED NOT DETECTED   Streptococcus agalactiae NOT DETECTED NOT DETECTED   Streptococcus pneumoniae NOT DETECTED NOT DETECTED   Streptococcus pyogenes NOT DETECTED NOT DETECTED   A.calcoaceticus-baumannii NOT DETECTED NOT DETECTED   Bacteroides fragilis NOT DETECTED NOT DETECTED   Enterobacterales DETECTED (A) NOT DETECTED   Enterobacter cloacae complex NOT DETECTED NOT DETECTED   Escherichia coli NOT DETECTED NOT DETECTED   Klebsiella aerogenes NOT DETECTED NOT DETECTED   Klebsiella oxytoca NOT DETECTED NOT DETECTED   Klebsiella pneumoniae NOT DETECTED NOT DETECTED   Proteus species NOT  DETECTED NOT DETECTED   Salmonella species NOT DETECTED NOT DETECTED   Serratia marcescens DETECTED (A) NOT DETECTED   Haemophilus influenzae NOT DETECTED NOT DETECTED   Neisseria meningitidis NOT DETECTED NOT DETECTED   Pseudomonas aeruginosa NOT DETECTED NOT DETECTED   Stenotrophomonas maltophilia NOT DETECTED NOT DETECTED   Candida albicans NOT DETECTED NOT DETECTED   Candida auris NOT DETECTED NOT DETECTED   Candida glabrata NOT DETECTED NOT DETECTED   Candida krusei NOT DETECTED NOT DETECTED   Candida parapsilosis NOT DETECTED NOT DETECTED   Candida tropicalis NOT DETECTED NOT DETECTED   Cryptococcus neoformans/gattii NOT DETECTED NOT DETECTED   CTX-M ESBL NOT DETECTED NOT DETECTED   Carbapenem resistance IMP NOT DETECTED NOT DETECTED   Carbapenem resistance KPC NOT DETECTED NOT DETECTED   Carbapenem resistance NDM NOT DETECTED NOT DETECTED   Carbapenem resist OXA 48 LIKE NOT DETECTED NOT DETECTED   Carbapenem resistance VIM NOT DETECTED NOT DETECTED    Caryl Pina 07/19/2020  4:54 AM

## 2020-07-19 NOTE — Progress Notes (Signed)
Initial Nutrition Assessment  DOCUMENTATION CODES:   Obesity unspecified  INTERVENTION:   If able to obtain enteral access, recommend: - Initiate Vital 1.5 @ 25 ml/hr and advance by 10 ml q 4 hours to goal rate of 65 ml/hr (1560 ml/day) - ProSource TF 45 ml BID  Recommended tube feeding regimen at goal provides 2420 kcal, 127 grams of protein, and 1192 ml of H2O.   NUTRITION DIAGNOSIS:   Inadequate oral intake related to inability to eat,lethargy/confusion as evidenced by NPO status.  GOAL:   Patient will meet greater than or equal to 90% of their needs  MONITOR:   Diet advancement,Labs,Weight trends,Skin,I & O's  REASON FOR ASSESSMENT:   Consult Enteral/tube feeding initiation and management  ASSESSMENT:   41 year old male who presented to the ED on 4/26 with AMS. PMH of polysubstance abuse. Pt admitted with septic shock secondary to GNR bacteremia and acute renal failure.   Discussed pt with RN and during ICU rounds. Per CCM, pt will likely need RRT if no improvement with pressors and abx. Pt is at risk for intubation.  Cortrak NG tube placement this AM was aborted due to pt's agitation and combativeness. Pt remains NPO without enteral access. Will leave TF recommendations for use if enteral access is obtained.  Spoke with pt's mother at bedside who reports pt typically had a great appetite and ate well. Pt would eat a lighter breakfast during the work week but would eat a big breakfast on the weekends. Pt ate lunch at work typically (frozen pizza or sandwich) and then ate a normal supper meal at home.  Pt's mother reports that pt has been staying with his girlfriend since last Thursday 4/21. She states that she spoke with pt's girlfriend who said pt had been sick since Thursday and had not been eating well since that time. Pt's mother reports that pt's girlfriend said that she could get him to drink tea and eat some cereal with milk.  Admit weight: 127.1 kg Current  weight: 130.2 kg  Medications reviewed and include: IV protonix, IV abx, levophed  Labs reviewed: sodium 133, BUN 76, creatinine 5.57 CBG's: 86-112 x 24 hours  UOP: 235 ml x 24 hours I/O's: +4.2 L since admit  NUTRITION - FOCUSED PHYSICAL EXAM:  Deferred.  Diet Order:   Diet Order            Diet NPO time specified  Diet effective now                 EDUCATION NEEDS:   Not appropriate for education at this time  Skin:  Skin Assessment: Skin Integrity Issues: Stage II: coccyx  Last BM:  no documented BM  Height:   Ht Readings from Last 1 Encounters:  07/18/20 '6\' 4"'$  (1.93 m)    Weight:   Wt Readings from Last 1 Encounters:  07/19/20 130.2 kg    Ideal Body Weight:  91.8 kg  BMI:  Body mass index is 34.94 kg/m.  Estimated Nutritional Needs:   Kcal:  [id number]  Protein:  110-130 grams  Fluid:  >/= 2.0 L    Gustavus Bryant, MS, RD, LDN Inpatient Clinical Dietitian Please see AMiON for contact information.

## 2020-07-20 ENCOUNTER — Inpatient Hospital Stay (HOSPITAL_COMMUNITY): Payer: 59

## 2020-07-20 DIAGNOSIS — A419 Sepsis, unspecified organism: Secondary | ICD-10-CM | POA: Diagnosis not present

## 2020-07-20 DIAGNOSIS — I351 Nonrheumatic aortic (valve) insufficiency: Secondary | ICD-10-CM

## 2020-07-20 DIAGNOSIS — R6521 Severe sepsis with septic shock: Secondary | ICD-10-CM | POA: Diagnosis not present

## 2020-07-20 DIAGNOSIS — A4153 Sepsis due to Serratia: Principal | ICD-10-CM

## 2020-07-20 DIAGNOSIS — L8990 Pressure ulcer of unspecified site, unspecified stage: Secondary | ICD-10-CM | POA: Insufficient documentation

## 2020-07-20 DIAGNOSIS — R7881 Bacteremia: Secondary | ICD-10-CM

## 2020-07-20 DIAGNOSIS — G9340 Encephalopathy, unspecified: Secondary | ICD-10-CM | POA: Diagnosis not present

## 2020-07-20 LAB — GLUCOSE, CAPILLARY
Glucose-Capillary: 101 mg/dL — ABNORMAL HIGH (ref 70–99)
Glucose-Capillary: 108 mg/dL — ABNORMAL HIGH (ref 70–99)
Glucose-Capillary: 87 mg/dL (ref 70–99)
Glucose-Capillary: 94 mg/dL (ref 70–99)
Glucose-Capillary: 97 mg/dL (ref 70–99)
Glucose-Capillary: 99 mg/dL (ref 70–99)

## 2020-07-20 LAB — COMPREHENSIVE METABOLIC PANEL
ALT: 21 U/L (ref 0–44)
AST: 44 U/L — ABNORMAL HIGH (ref 15–41)
Albumin: 1.6 g/dL — ABNORMAL LOW (ref 3.5–5.0)
Alkaline Phosphatase: 179 U/L — ABNORMAL HIGH (ref 38–126)
Anion gap: 16 — ABNORMAL HIGH (ref 5–15)
BUN: 105 mg/dL — ABNORMAL HIGH (ref 6–20)
CO2: 21 mmol/L — ABNORMAL LOW (ref 22–32)
Calcium: 8.8 mg/dL — ABNORMAL LOW (ref 8.9–10.3)
Chloride: 96 mmol/L — ABNORMAL LOW (ref 98–111)
Creatinine, Ser: 7.19 mg/dL — ABNORMAL HIGH (ref 0.61–1.24)
GFR, Estimated: 9 mL/min — ABNORMAL LOW (ref >60)
Glucose, Bld: 114 mg/dL — ABNORMAL HIGH (ref 70–99)
Potassium: 4.7 mmol/L (ref 3.5–5.1)
Sodium: 133 mmol/L — ABNORMAL LOW (ref 135–145)
Total Bilirubin: 8.1 mg/dL — ABNORMAL HIGH (ref 0.3–1.2)
Total Protein: 6 g/dL — ABNORMAL LOW (ref 6.5–8.1)

## 2020-07-20 LAB — ECHO TEE
AR max vel: 11.08 cm2
AV Area VTI: 9.21 cm2
AV Area mean vel: 8.97 cm2
AV Mean grad: 2 mmHg
AV Peak grad: 3.2 mmHg
Ao pk vel: 0.89 m/s

## 2020-07-20 LAB — BLOOD GAS, ARTERIAL
Acid-base deficit: 7.3 mmol/L — ABNORMAL HIGH (ref 0.0–2.0)
Bicarbonate: 18.2 mmol/L — ABNORMAL LOW (ref 20.0–28.0)
FIO2: 100
O2 Saturation: 99.3 %
Patient temperature: 38.7
pCO2 arterial: 43.2 mmHg (ref 32.0–48.0)
pH, Arterial: 7.258 — ABNORMAL LOW (ref 7.350–7.450)
pO2, Arterial: 336 mmHg — ABNORMAL HIGH (ref 83.0–108.0)

## 2020-07-20 LAB — CBC
HCT: 33.7 % — ABNORMAL LOW (ref 39.0–52.0)
Hemoglobin: 10.7 g/dL — ABNORMAL LOW (ref 13.0–17.0)
MCH: 27.4 pg (ref 26.0–34.0)
MCHC: 31.8 g/dL (ref 30.0–36.0)
MCV: 86.2 fL (ref 80.0–100.0)
Platelets: 22 10*3/uL — CL (ref 150–400)
RBC: 3.91 MIL/uL — ABNORMAL LOW (ref 4.22–5.81)
RDW: 16.5 % — ABNORMAL HIGH (ref 11.5–15.5)
WBC: 13 10*3/uL — ABNORMAL HIGH (ref 4.0–10.5)
nRBC: 0 % (ref 0.0–0.2)

## 2020-07-20 LAB — MAGNESIUM
Magnesium: 3 mg/dL — ABNORMAL HIGH (ref 1.7–2.4)
Magnesium: 3.2 mg/dL — ABNORMAL HIGH (ref 1.7–2.4)

## 2020-07-20 LAB — RENAL FUNCTION PANEL
Albumin: 1.5 g/dL — ABNORMAL LOW (ref 3.5–5.0)
Anion gap: 15 (ref 5–15)
BUN: 112 mg/dL — ABNORMAL HIGH (ref 6–20)
CO2: 21 mmol/L — ABNORMAL LOW (ref 22–32)
Calcium: 8.1 mg/dL — ABNORMAL LOW (ref 8.9–10.3)
Chloride: 100 mmol/L (ref 98–111)
Creatinine, Ser: 7.08 mg/dL — ABNORMAL HIGH (ref 0.61–1.24)
GFR, Estimated: 9 mL/min — ABNORMAL LOW (ref >60)
Glucose, Bld: 95 mg/dL (ref 70–99)
Phosphorus: 4.9 mg/dL — ABNORMAL HIGH (ref 2.5–4.6)
Potassium: 4.9 mmol/L (ref 3.5–5.1)
Sodium: 136 mmol/L (ref 135–145)

## 2020-07-20 LAB — PHOSPHORUS
Phosphorus: 4.9 mg/dL — ABNORMAL HIGH (ref 2.5–4.6)
Phosphorus: 4.8 mg/dL — ABNORMAL HIGH (ref 2.5–4.6)

## 2020-07-20 LAB — PROCALCITONIN: Procalcitonin: 26.01 ng/mL

## 2020-07-20 MED ORDER — DOCUSATE SODIUM 50 MG/5ML PO LIQD
100.0000 mg | Freq: Two times a day (BID) | ORAL | Status: DC
  Administered 2020-07-21 – 2020-07-24 (×7): 100 mg
  Filled 2020-07-20 (×9): qty 10

## 2020-07-20 MED ORDER — MIDAZOLAM HCL 2 MG/2ML IJ SOLN
INTRAMUSCULAR | Status: AC
  Filled 2020-07-20: qty 4

## 2020-07-20 MED ORDER — FENTANYL CITRATE (PF) 100 MCG/2ML IJ SOLN
INTRAMUSCULAR | Status: AC
  Administered 2020-07-20: 100 ug via INTRAVENOUS
  Filled 2020-07-20: qty 2

## 2020-07-20 MED ORDER — HEPARIN SODIUM (PORCINE) 1000 UNIT/ML DIALYSIS
1000.0000 [IU] | INTRAMUSCULAR | Status: DC | PRN
  Administered 2020-07-20 – 2020-07-25 (×3): 2800 [IU] via INTRAVENOUS_CENTRAL
  Filled 2020-07-20: qty 3
  Filled 2020-07-20: qty 6
  Filled 2020-07-20: qty 4
  Filled 2020-07-20: qty 3
  Filled 2020-07-20 (×4): qty 6

## 2020-07-20 MED ORDER — FENTANYL CITRATE (PF) 100 MCG/2ML IJ SOLN
50.0000 ug | Freq: Once | INTRAMUSCULAR | Status: AC
  Administered 2020-07-20: 50 ug via INTRAVENOUS

## 2020-07-20 MED ORDER — SODIUM CHLORIDE 0.9 % IV SOLN
2.0000 g | Freq: Two times a day (BID) | INTRAVENOUS | Status: DC
  Administered 2020-07-21 – 2020-07-27 (×13): 2 g via INTRAVENOUS
  Filled 2020-07-20 (×13): qty 2

## 2020-07-20 MED ORDER — SODIUM BICARBONATE 4.2 % IV SOLN: 150.0000 meq | Freq: Once | INTRAVENOUS | Status: DC

## 2020-07-20 MED ORDER — DEXTROSE-NACL 5-0.9 % IV SOLN: INTRAVENOUS | Status: DC

## 2020-07-20 MED ORDER — FENTANYL 2500MCG IN NS 250ML (10MCG/ML) PREMIX INFUSION
50.0000 ug/h | INTRAVENOUS | Status: DC
  Administered 2020-07-20: 165 ug/h via INTRAVENOUS
  Administered 2020-07-20: 50 ug/h via INTRAVENOUS
  Administered 2020-07-20: 150 ug/h via INTRAVENOUS
  Administered 2020-07-21: 75 ug/h via INTRAVENOUS
  Administered 2020-07-22 – 2020-07-23 (×3): 200 ug/h via INTRAVENOUS
  Administered 2020-07-23: 150 ug/h via INTRAVENOUS
  Administered 2020-07-24: 50 ug/h via INTRAVENOUS
  Administered 2020-07-25: 200 ug/h via INTRAVENOUS
  Administered 2020-07-25: 150 ug/h via INTRAVENOUS
  Administered 2020-07-26: 175 ug/h via INTRAVENOUS
  Administered 2020-07-27: 150 ug/h via INTRAVENOUS
  Filled 2020-07-20 (×12): qty 250

## 2020-07-20 MED ORDER — ETOMIDATE 2 MG/ML IV SOLN
INTRAVENOUS | Status: AC
  Administered 2020-07-20: 40 mg via INTRAVENOUS
  Filled 2020-07-20: qty 10

## 2020-07-20 MED ORDER — EPINEPHRINE HCL 5 MG/250ML IV SOLN IN NS
INTRAVENOUS | Status: AC
  Filled 2020-07-20: qty 250

## 2020-07-20 MED ORDER — EPINEPHRINE HCL 5 MG/250ML IV SOLN IN NS: 0.5000 ug/min | INTRAVENOUS | Status: DC

## 2020-07-20 MED ORDER — PRISMASOL BGK 4/2.5 32-4-2.5 MEQ/L REPLACEMENT SOLN
Status: DC
  Filled 2020-07-20 (×3): qty 5000

## 2020-07-20 MED ORDER — ORAL CARE MOUTH RINSE
15.0000 mL | OROMUCOSAL | Status: DC
  Administered 2020-07-20 – 2020-07-28 (×77): 15 mL via OROMUCOSAL

## 2020-07-20 MED ORDER — CHLORHEXIDINE GLUCONATE 0.12% ORAL RINSE (MEDLINE KIT)
15.0000 mL | Freq: Two times a day (BID) | OROMUCOSAL | Status: DC
  Administered 2020-07-20 – 2020-08-02 (×23): 15 mL via OROMUCOSAL

## 2020-07-20 MED ORDER — SODIUM CHLORIDE 0.9 % IV SOLN
1.2500 ng/kg/min | INTRAVENOUS | Status: DC
  Administered 2020-07-20: 5 ng/kg/min via INTRAVENOUS
  Administered 2020-07-21: 28 ng/kg/min via INTRAVENOUS
  Filled 2020-07-20 (×3): qty 1

## 2020-07-20 MED ORDER — PRISMASOL BGK 4/2.5 32-4-2.5 MEQ/L EC SOLN
Status: DC
  Filled 2020-07-20 (×65): qty 5000

## 2020-07-20 MED ORDER — SODIUM BICARBONATE 8.4 % IV SOLN
150.0000 meq | Freq: Once | INTRAVENOUS | Status: AC
Start: 2020-07-20 — End: 2020-07-20
  Administered 2020-07-20: 150 meq via INTRAVENOUS

## 2020-07-20 MED ORDER — ETOMIDATE 2 MG/ML IV SOLN: 40.0000 mg | INTRAVENOUS | Status: AC

## 2020-07-20 MED ORDER — FENTANYL CITRATE (PF) 100 MCG/2ML IJ SOLN
100.0000 ug | INTRAMUSCULAR | Status: AC
Start: 2020-07-20 — End: 2020-07-20

## 2020-07-20 MED ORDER — FENTANYL BOLUS VIA INFUSION
50.0000 ug | INTRAVENOUS | Status: DC | PRN
  Administered 2020-07-20: 100 ug via INTRAVENOUS
  Administered 2020-07-20 (×2): 50 ug via INTRAVENOUS
  Administered 2020-07-20 – 2020-07-24 (×17): 100 ug via INTRAVENOUS
  Administered 2020-07-24: 50 ug via INTRAVENOUS
  Administered 2020-07-24: 100 ug via INTRAVENOUS
  Administered 2020-07-24: 50 ug via INTRAVENOUS
  Administered 2020-07-25 – 2020-07-26 (×4): 100 ug via INTRAVENOUS
  Administered 2020-07-26: 50 ug via INTRAVENOUS
  Administered 2020-07-26 (×2): 100 ug via INTRAVENOUS
  Administered 2020-07-26: 50 ug via INTRAVENOUS
  Filled 2020-07-20: qty 100

## 2020-07-20 MED ORDER — SODIUM BICARBONATE 8.4 % IV SOLN
INTRAVENOUS | Status: AC
  Filled 2020-07-20: qty 150

## 2020-07-20 MED ORDER — AMIODARONE IV BOLUS ONLY 150 MG/100ML: 150.0000 mg | Freq: Once | INTRAVENOUS | Status: DC | PRN

## 2020-07-20 MED ORDER — ACETAMINOPHEN 650 MG RE SUPP
650.0000 mg | Freq: Once | RECTAL | Status: AC
  Administered 2020-07-20: 650 mg via RECTAL
  Filled 2020-07-20: qty 1

## 2020-07-20 MED ORDER — MIDAZOLAM HCL 2 MG/2ML IJ SOLN
INTRAMUSCULAR | Status: AC
  Administered 2020-07-20: 4 mg via INTRAVENOUS
  Filled 2020-07-20: qty 2

## 2020-07-20 MED ORDER — POLYETHYLENE GLYCOL 3350 17 G PO PACK
17.0000 g | PACK | Freq: Every day | ORAL | Status: DC
  Administered 2020-07-21 – 2020-07-23 (×3): 17 g
  Filled 2020-07-20 (×4): qty 1

## 2020-07-20 MED ORDER — LACTATED RINGERS IV BOLUS
500.0000 mL | Freq: Once | INTRAVENOUS | Status: AC
  Administered 2020-07-20: 500 mL via INTRAVENOUS

## 2020-07-20 MED ORDER — ACETAMINOPHEN 650 MG RE SUPP
650.0000 mg | RECTAL | Status: DC | PRN
  Administered 2020-07-20: 650 mg via RECTAL
  Filled 2020-07-20: qty 1

## 2020-07-20 MED ORDER — MIDAZOLAM BOLUS VIA INFUSION
0.0000 mg | INTRAVENOUS | Status: DC | PRN
Start: 2020-07-20 — End: 2020-07-23
  Administered 2020-07-20: 5 mg via INTRAVENOUS
  Administered 2020-07-20 – 2020-07-21 (×6): 2 mg via INTRAVENOUS
  Filled 2020-07-20: qty 5

## 2020-07-20 MED ORDER — SODIUM BICARBONATE 8.4 % IV SOLN
INTRAVENOUS | Status: AC
  Filled 2020-07-20: qty 50

## 2020-07-20 MED ORDER — ACETAMINOPHEN 160 MG/5ML PO SOLN
500.0000 mg | Freq: Four times a day (QID) | ORAL | Status: DC | PRN
  Filled 2020-07-20: qty 20.3

## 2020-07-20 MED ORDER — MIDAZOLAM HCL 2 MG/2ML IJ SOLN: 4.0000 mg | INTRAMUSCULAR | Status: AC

## 2020-07-20 MED ORDER — PRISMASOL BGK 4/2.5 32-4-2.5 MEQ/L REPLACEMENT SOLN
Status: DC
  Filled 2020-07-20 (×13): qty 5000

## 2020-07-20 MED ORDER — MIDAZOLAM 50MG/50ML (1MG/ML) PREMIX INFUSION
0.0000 mg/h | INTRAVENOUS | Status: DC
  Administered 2020-07-20: 3 mg/h via INTRAVENOUS
  Administered 2020-07-20: 2 mg/h via INTRAVENOUS
  Administered 2020-07-20: 3 mg/h via INTRAVENOUS
  Administered 2020-07-21: 1 mg/h via INTRAVENOUS
  Filled 2020-07-20 (×4): qty 50

## 2020-07-20 MED ORDER — ETOMIDATE 2 MG/ML IV SOLN
INTRAVENOUS | Status: AC
  Filled 2020-07-20: qty 10

## 2020-07-20 NOTE — Progress Notes (Signed)
*  Echocardiogram Echocardiogram Transesophageal bedside with color doppler and 3D has been performed.  Darlina Sicilian M 07/20/2020, 1:31 PM

## 2020-07-20 NOTE — Progress Notes (Signed)
NAME:  Alex [name], MRN:  [id number], DOB:  December 04, 1980, LOS: 2 ADMISSION DATE:  07/18/2020, CONSULTATION DATE:  4/26 REFERRING MD:  Dr. Florina Ou, CHIEF COMPLAINT:  AMS   History of Present Illness:  40 yo male smoker brought to [facility] ER with altered mental status.  Noted to have headache and feeling feverish before admission.  Found to have hypotension, AKI, thrombocytopenia, splenomegaly, lactic acidosis, elevated bilirubin, hypoglycemia.  Started on antibiotics and pressors for possible sepsis, but no clear source of infection.  Normal renal function from February 2022.  Uses suboxone for history of substance abuse.  UDS positive for amphetamines.  Transfer to [facility] for further therapy.  Hx from chart, medical team, and pt's mother.  Pertinent  Medical History  Polysubstance Abuse - amphetamines, heroin. On Suboxone.   Ruptured Disc  Significant Hospital Events: Including procedures, antibiotic start and stop dates in addition to other pertinent events   . 4/26 transfer from [facility] to [facility] . CT head 4/26 >> no acute findings . CT abd/pelvis 4/26 >> cholelithiasis w/o inflammatory chagnes, mild hepatomegaly (25 cm), moderate splenomegaly (16.7 cm), 2 mm non obstructing kidney stone on Lt . 4/27 CVL placed. Pressors escalating.   Interim History / Subjective:  4/28: still with poor uop. Nephrology consult placed again and awaiting call back. Tee today. Will need intubation for this as pt is pretty encephalopathic at baseline currently. Renal indices cont to climb. Still on vasopressors to maintain bp. Noted events overnight with tachycardia. E-lytes ok.   Objective   Blood pressure (!) 122/45, pulse 87, temperature 99.5 F (37.5 C), resp. rate (!) 28, height '6\' 4"'$  (1.93 m), weight 128.1 kg, SpO2 95 %.        Intake/Output Summary (Last 24 hours) at 07/20/2020 0837 Last data filed at 07/20/2020 0800 Gross per 24 hour  Intake 3238.09 ml  Output 140 ml  Net 3098.09 ml   Filed Weights    07/18/20 0856 07/19/20 0400 07/20/20 0130  Weight: 127.1 kg 130.2 kg 128.1 kg    Examination:  General:  Adult male, overweight, moaning Neuro:  Lethargic, not following commands. Eyes open to touch. Non-purposeful movements.  HEENT:  /AT, No JVD noted, PERRL, poor dentition and very dry mm Cardiovascular:  Tachy, regular, no MRG Lungs:  Clear bilateral breath sounds, diminished bases Abdomen:  Soft, non-distended, non-tender.  Musculoskeletal:  No acute deformity.  Skin:  Intact, MMM  Labs/imaging that I havepersonally reviewed  (right click and "Reselect all SmartList Selections" daily)  Worsening Cr and BUN  BCID with serratia marcescens  Resolved Hospital Problem list      Assessment & Plan:    Septic shock secondary to serratia bacteremia: Serratia on BCID - Cefepime per pharmacy, awaiting sensitivities - Echo with severe AI, for tee today.  - Levophed for MAP goal 65 -pct remains high but improved Severe AI:  -for tee today.  -suspected endocarditis  Acute renal failure secondary to shock - s/p 5 L IVF - BP support - Follow BMP - Will likely need RRT if no improvement with pressors, antibiotics.  -awaiting renal consult Hyponatremia:  -on ns at this time.   Acute metabolic encephalopathy: lethargy with intermittent agitation.  - supportive care - intubation planned for today.    Anemia Thrombocytopenia: most likely secondary to sepsis. No schistocytes seen on peripheral smear. LDH with very mild elevation and haptoglobin wnl as well as reticulocytes - Transfuse for platelets less than 10 or active bleed - Trend CBC -worsening  plts.   Polysubstance abuse: suboxone, amphetamines - rehabilitation support if able to survive this admission.   Best practice (right click and "Reselect all SmartList Selections" daily)  Diet:  NPO Pain/Anxiety/Delirium protocol (if indicated): No VAP protocol (if indicated): Not indicated DVT prophylaxis: SCD GI  prophylaxis: PPI Glucose control:  SSI No Central venous access:  N/A Arterial line:  N/A Foley:  Yes, and it is still needed Mobility:  bed rest  PT consulted: N/A Last date of multidisciplinary goals of care discussion [updated mother at bedside. ] Code Status:  full code Disposition: ICU   Critical care time: The patient is critically ill with multiple organ systems failure and requires high complexity decision making for assessment and support, frequent evaluation and titration of therapies, application of advanced monitoring technologies and extensive interpretation of multiple databases.  Critical care time 42 mins. This represents my time independent of the NPs time taking care of the pt. This is excluding procedures.    Audria Nine DO [facility] 07/20/2020, 8:38 AM See Amion for pager If no response to pager, please call [phone] until 1900 After 1900 please call [facility] [phone]

## 2020-07-20 NOTE — Progress Notes (Signed)
eLink Physician-Brief Progress Note Patient Name: Alex [name] DOB: Jul 24, 1980 MRN: [id number]   Date of Service  07/20/2020  HPI/Events of Note  Camera: HR > 150 to 170. Narrow complex. Looks like sinus tachy. Some sweating. Encephalopathy on nasal o2. Air bilaterally ok. sats good.  Stat BG 100. NPO since admit. On asp precautions  As per RN discussion.   eICU Interventions  - stat LR bolus 500 ml. Meantime HR back to 94. - start D5 NS at 75 ml/hr. EF normal.      Intervention Category Major Interventions: Arrhythmia - evaluation and management  Elmer Sow 07/20/2020, 12:18 AM

## 2020-07-20 NOTE — Progress Notes (Signed)
Pharmacy Antibiotic Note  Alex [name] is a 40 y.o. male admitted on 07/18/2020 and found to have Serratia bacteremia with TEE showing endocarditis. Pharmacy has been consulted for Cefepime dosing.  The patient was initiated on CRRT his evening with a start time around 1900 - will adjust Cefepime dosing to q12h for now. If high effluent rates achieved - can consider q8h dosing.   Plan: - Start Cefepime 2g IV every 12 hours - Will continue to follow CRRT tolerance, culture results, LOT, and antibiotic de-escalation plans   Height: '6\' 4"'$  (193 cm) Weight: 128.1 kg (282 lb 6.6 oz) IBW/kg (Calculated) : 86.8  Temp (24hrs), Avg:100.2 F (37.9 C), Min:99.5 F (37.5 C), Max:102.2 F (39 C)  Recent Labs  Lab 07/18/20 0450 07/18/20 1135 07/18/20 1351 07/18/20 2039 07/19/20 0126 07/19/20 0140 07/20/20 0246  WBC  --  10.3 14.0* 11.9*  --  13.1* 13.0*  CREATININE  --  4.83* 4.88* 5.37*  --  5.57* 7.19*  LATICACIDVEN 6.1* 4.8* 4.1* 2.6* 1.8  --   --     Estimated Creatinine Clearance: 20.6 mL/min (A) (by C-G formula based on SCr of 7.19 mg/dL (H)).    No Known Allergies  Antimicrobials this admission: Vancomycin 4/26 x 1 Cefepime 4/26 >>  Dose adjustments this admission:   Microbiology results: 4/26 Flu/COVID >> neg 4/26 BCx >> Serratia 4/28 BCx >>  Thank you for allowing pharmacy to be a part of this patient's care.  Alycia Rossetti, PharmD, BCPS Clinical Pharmacist Clinical phone for 07/20/2020: [phone] 07/20/2020 8:15 PM   **Pharmacist phone directory can now be found on [email or web address] ([email or web address]).  Listed under [place].

## 2020-07-20 NOTE — CV Procedure (Signed)
   Transesophageal Echocardiogram  Indications: Bacteremia, IV drug use  Time out performed  Patient was administered Versed and fentanyl while intubated under the direct supervision of nursing team as well as respiratory therapy.  Findings:  Left Ventricle: Normal ejection fraction 65%  Mitral Valve: Normal mitral valve, no significant regurgitation  Aortic Valve: Large 3 x 1.5 cm aortic valve vegetation on the ventricular surface of the left coronary cusp resulting in perforated leaflet and torrential/severe aortic regurgitation posteriorly directed.   Tricuspid Valve: Normal-appearing tricuspid valve no vegetations.  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  Discussed with critical care team.  They will be contacting ID team as well as CT surgery for further evaluation.  Discussed with mother and brother.  Understands the severity of the situation.  Candee Furbish, MD

## 2020-07-20 NOTE — Procedures (Signed)
Central Venous Catheter Insertion Procedure Note  Alex [name]  [id number]  1980/10/30  Date:07/20/20  Time:5:59 PM   Provider Performing:Ronaldo Crilly [name]   Procedure: Insertion of Non-tunneled Central Venous Catheter(36556)with US guidance [id number])    Indication(s) Hemodialysis  Consent Risks of the procedure as well as the alternatives and risks of each were explained to the patient and/or caregiver.  Consent for the procedure was obtained and is signed in the bedside chart  Anesthesia Topical only with 1% lidocaine   Timeout Verified patient identification, verified procedure, site/side was marked, verified correct patient position, special equipment/implants available, medications/allergies/relevant history reviewed, required imaging and test results available.  Sterile Technique Maximal sterile technique including full sterile barrier drape, hand hygiene, sterile gown, sterile gloves, mask, hair covering, sterile ultrasound probe cover (if used).  Procedure Description Area of catheter insertion was cleaned with chlorhexidine and draped in sterile fashion.   With real-time ultrasound guidance a HD catheter was placed into the left internal jugular vein.  Nonpulsatile blood flow and easy flushing noted in all ports.  The catheter was sutured in place and sterile dressing applied.  Complications/Tolerance None; patient tolerated the procedure well. Chest X-ray is ordered to verify placement for internal jugular or subclavian cannulation.  Chest x-ray is not ordered for femoral cannulation.  EBL Minimal  Specimen(s) None   Line was done under u/s guidance. Easily compressible vein noted with neighboring pulsatile artery. Upon stick dark red non pulsatile blood was noted. Wire easily advanced. Wire was verified to be in easily compressible/non pulsatile vessel in both cross sectional and longitudinal views under ultrasound guidance. Skin was easily dilated and catheter  advanced without issue. Wire was withdrawn from vessel. No air was aspirated thru entirety of the procedure. Pt tolerated well. No complications were appreciated.

## 2020-07-20 NOTE — H&P (Addendum)
NAME:  Alex [name], MRN:  [id number], DOB:  [birth year], LOS: 2 ADMISSION DATE:  07/18/2020, CONSULTATION DATE:  4/26 REFERRING MD:  Dr. Florina Ou, CHIEF COMPLAINT:  AMS   History of Present Illness:  40 yo male smoker brought to [facility] ER with altered mental status.  Noted to have headache and feeling feverish before admission.  Found to have hypotension, AKI, thrombocytopenia, splenomegaly, lactic acidosis, elevated bilirubin, hypoglycemia.  Started on antibiotics and pressors for possible sepsis, but no clear source of infection.  Normal renal function from February 2022.  Uses suboxone for history o substance abuse.  UDS positive for amphetamines.  Transfer to [facility] for further therapy.  Hx from chart, medical team, and pt's mother.  Pertinent  Medical History  Polysubstance Abuse - amphetamines, heroin. On Suboxone.   Ruptured Disc  Significant Hospital Events: Including procedures, antibiotic start and stop dates in addition to other pertinent events   . 4/26 transfer from [facility] to [facility]  Interim History / Subjective:    Objective   Blood pressure (!) 125/45, pulse 90, temperature (!) 101.84 F (38.8 C), resp. rate 18, height '6\' 4"'$  (1.93 m), weight 128.1 kg, SpO2 99 %.    Vent Mode: PRVC FiO2 (%):  [100 %] 100 % Set Rate:  [15 bmp] 15 bmp Vt Set:  [600 mL] 600 mL PEEP:  [10 cmH20] 10 cmH20   Intake/Output Summary (Last 24 hours) at 07/20/2020 1809 Last data filed at 07/20/2020 1600 Gross per 24 hour  Intake 1698.8 ml  Output 130 ml  Net 1568.8 ml   Filed Weights   07/18/20 0856 07/19/20 0400 07/20/20 0130  Weight: 127.1 kg 130.2 kg 128.1 kg    Examination:  General - in trendelenburg position in ER, moaning intermittently Eyes - pupils reactive, jaundice ENT - no sinus tenderness, no stridor Cardiac - regular, tachycardic Chest - equal breath sounds b/l, no wheezing or rales Abdomen - soft, decreased bowel sounds Extremities - no cyanosis, clubbing, or edema, in 4  point restraints Skin - no rashes Neuro - not following commands, grimaces with stimulation   Labs/imaging that I havepersonally reviewed  (right click and "Reselect all SmartList Selections" daily)  CT head 4/26 >> no acute findings CT abd/pelvis 4/26 >> cholelithiasis w/o inflammatory chagnes, mild hepatomegaly (25 cm), moderate splenomegaly (16.7 cm), 2 mm non obstructing kidney stone on Lt  CMP Latest Ref Rng & Units 07/20/2020 07/19/2020 07/18/2020  Glucose 70 - 99 mg/dL 114(H) 111(H) 109(H)  BUN 6 - 20 mg/dL 105(H) 76(H) 70(H)  Creatinine 0.61 - 1.24 mg/dL 7.19(H) 5.57(H) 5.37(H)  Sodium 135 - 145 mmol/L 133(L) 133(L) 132(L)  Potassium 3.5 - 5.1 mmol/L 4.7 4.4 4.5  Chloride 98 - 111 mmol/L 96(L) 96(L) 96(L)  CO2 22 - 32 mmol/L 21(L) 21(L) 20(L)  Calcium 8.9 - 10.3 mg/dL 8.8(L) 8.5(L) 8.3(L)  Total Protein 6.5 - 8.1 g/dL 6.0(L) 5.8(L) -  Total Bilirubin 0.3 - 1.2 mg/dL 8.1(H) 7.0(H) -  Alkaline Phos 38 - 126 U/L 179(H) 326(H) -  AST 15 - 41 U/L 44(H) 51(H) -  ALT 0 - 44 U/L 21 23 -    CBC Latest Ref Rng & Units 07/20/2020 07/19/2020 07/18/2020  WBC 4.0 - 10.5 K/uL 13.0(H) 13.1(H) 11.9(H)  Hemoglobin 13.0 - 17.0 g/dL 10.7(L) 11.5(L) 11.4(L)  Hematocrit 39.0 - 52.0 % 33.7(L) 35.5(L) 35.1(L)  Platelets 150 - 400 K/uL 22(LL) 32(L) 30(L)    Resolved Hospital Problem list      Assessment & Plan:  Altered mental status with anemia, thrombocytopenia, lactic acidosis, AKI, elevated bilirubin, splenomegaly, hypotension, and hypoglycemia. - concern for sepsis, but no clear source - also concern for MAHA - continue ABx with vancomycin, cefepime - f/u lactic acid and procalcitonin - check DIC panel, LDH - check anemia panel - f/u CMET, CBC - continue IV fluids - pressors to keep MAP > 65 - check TSH, cortisol - check ABG, f/u chest xray - aspiration, fall precautions  D/w Dr. Gilford Raid and Dr. Manuella Ghazi.  Updated pt's mother at bedside in ER.  Best practice (right click and  "Reselect all SmartList Selections" daily)  Diet:  NPO Pain/Anxiety/Delirium protocol (if indicated): No VAP protocol (if indicated): Not indicated DVT prophylaxis: SCD GI prophylaxis: PPI Glucose control:  SSI No Central venous access:  N/A Arterial line:  N/A Foley:  Yes, and it is still needed Mobility:  bed rest  PT consulted: N/A Last date of multidisciplinary goals of care discussion '[]'$  Code Status:  full code Disposition: ICU  Review of Systems:   Unable to obtain.  Past Medical History:  He,  has a past medical history of Ruptured lumbar disc.   Surgical History:  History reviewed. No pertinent surgical history.   Social History:   reports that he has been smoking cigarettes. He has been smoking about 1.00 pack per day. He has never used smokeless tobacco. He reports current drug use. Drug: Marijuana. He reports that he does not drink alcohol.   Family History:  His family history is negative for TTP.   Allergies No Known Allergies   Home Medications  Prior to Admission medications   Medication Sig Start Date End Date Taking? Authorizing Provider  buprenorphine-naloxone (SUBOXONE) 8-2 mg SUBL SL tablet Place 1 tablet under the tongue 2 (two) times daily. May place additional 1/2 tablet under the tongue daily as needed 06/30/20 07/30/20 Yes [provider]  lisinopril-hydrochlorothiazide (ZESTORETIC) 10-12.5 MG tablet Take 1 tablet by mouth daily. 11/12/19  Yes [provider]  cyclobenzaprine (FLEXERIL) 10 MG tablet Take 1 tablet (10 mg total) by mouth 3 (three) times daily as needed. Patient not taking: Reported on 07/18/2020 06/09/18   Triplett, Tammy, PA-C  diazepam (VALIUM) 5 MG tablet Take 1 tablet (5 mg total) by mouth 2 (two) times daily. Patient not taking: Reported on 07/18/2020 06/09/18   Triplett, Lynelle Smoke, PA-C  ibuprofen (ADVIL,MOTRIN) 800 MG tablet Take 1 tablet (800 mg total) by mouth 3 (three) times daily. Patient not taking: Reported on  07/18/2020 06/09/18   Kem Parkinson, PA-C  naloxone [facility]) nasal spray 4 mg/0.1 mL Place 1 spray into the nose once. As needed for up to 1 dose. 01/16/18   [provider]  predniSONE (DELTASONE) 50 MG tablet 1 tablet PO QD X5 days Patient not taking: Reported on 07/18/2020 01/10/20   Ripley Fraise, MD     Critical care time: 61 minutes  Chesley Mires, MD [place] Pager - [phone] 07/20/2020, 6:09 PM    H&P developed from colleagues note as he was seen earlier in the day by our group at [facility].   No charge

## 2020-07-20 NOTE — Progress Notes (Signed)
Will place hd catheter today to start crrt if mother is agreeable.   Will send for MRI brain.   Will consult TCTS for evaluation in re: TEE findings. AoV endocarditis  ID consult as well

## 2020-07-20 NOTE — Progress Notes (Signed)
Open in error

## 2020-07-20 NOTE — Procedures (Signed)
Intubation Procedure Note  Alex [name]  [id number]  November 01, 1980  Date:07/20/20  Time:12:01 PM   Provider Performing:Alex [name] Alex [name]    Procedure: Intubation (31500)  Indication(s) Respiratory Failure  Consent Risks of the procedure as well as the alternatives and risks of each were explained to the patient and/or caregiver.  Consent for the procedure was obtained and is signed in the bedside chart   Anesthesia Etomidate, Versed, Fentanyl and see mar   Time Out Verified patient identification, verified procedure, site/side was marked, verified correct patient position, special equipment/implants available, medications/allergies/relevant history reviewed, required imaging and test results available.   Sterile Technique Usual hand hygeine, masks, and gloves were used   Procedure Description Patient positioned in bed supine.  Sedation given as noted above.  Patient was intubated with endotracheal tube using Glidescope.  View was Grade 1 full glottis .  Number of attempts was 1.  Colorimetric CO2 detector was consistent with tracheal placement.   Complications/Tolerance None; patient tolerated the procedure well. Chest X-ray is ordered to verify placement.  sats remained >95% throughout   EBL none   Specimen(s) None

## 2020-07-20 NOTE — Procedures (Signed)
Arterial Catheter Insertion Procedure Note  Alex [name]  [id number]  1981-03-20  Date:07/20/20  Time:5:58 PM    Provider Performing: Audria Nine    Procedure: Insertion of Arterial Line [id number]) with US guidance [id number])   Indication(s) Blood pressure monitoring and/or need for frequent ABGs  Consent Risks of the procedure as well as the alternatives and risks of each were explained to the patient and/or caregiver.  Consent for the procedure was obtained and is signed in the bedside chart  Anesthesia None   Time Out Verified patient identification, verified procedure, site/side was marked, verified correct patient position, special equipment/implants available, medications/allergies/relevant history reviewed, required imaging and test results available.   Sterile Technique Maximal sterile technique including full sterile barrier drape, hand hygiene, sterile gown, sterile gloves, mask, hair covering, sterile ultrasound probe cover (if used).   Procedure Description Area of catheter insertion was cleaned with chlorhexidine and draped in sterile fashion. With real-time ultrasound guidance an arterial catheter was placed into the left radial artery.  Appropriate arterial tracings confirmed on monitor.     Complications/Tolerance None; patient tolerated the procedure well.   EBL Minimal   Specimen(s) None

## 2020-07-20 NOTE — Progress Notes (Addendum)
[facility] Progress Note Patient Name: Alex [name] DOB: 08-Nov-1980 MRN: [id number]   Date of Service  07/20/2020  HPI/Events of Note  RN noted patient once again had HR in 150s. EKG was getting done and patient quickly flipped back to NSR in 80s. I saw him on camera and he had rate of 86, MAP was 66 on 20 mic/min levophed and on vasopressin. He is in no distress. RN noted that earlier as well, the episode lasted less than 90 seconds and was not associated with change in vitals.   eICU Interventions  Episodes have been too brief to even be captured on an EKG. Would get AM labs now, including a magnesium. Continue pressors and d5ns that is infusing. His Qtc is around 450 . D/w RN     Intervention Category Major Interventions: Arrhythmia - evaluation and management  Margaretmary Lombard 07/20/2020, 2:33 AM   6 am Notified of A fib with RVR HR was in 150s Saw him on camera, EKG reviewed  No symptoms, no drop in BP and as I was ordering amiodarone, the patient converted to sinus rhythm in 80s/90s No rate control agent needed for now but RN notes that he did have this episode for around 10 min I am placing a one time PRN order for amiodarone in case this happens again, only the 150 mg bolus has been ordered and I advised RN use it only for rates over 140 that are sustained, otherwise no need to administer since this is the 3rd time he has converted to NSR with no intervention The order can be discontinued during the day if no further episodes occur

## 2020-07-20 NOTE — Consult Note (Signed)
[facility] Infectious [facility]                                                                                        Patient Identification: Patient Name: Alex [name] MRN: [id number] [place] Date: 07/18/2020  1:39 AM Today's Date: 07/20/2020 Reason for consult: serratia endocarditis  Requesting provider: Audria Nine   Active Problems:   Amphetamine abuse ([place])   Sepsis ([place])   Septic shock ([place])   Altered mental status   Acute renal failure with oliguria ([place])   Elevated LFTs   Pressure injury of skin   Antibiotics: cefepime 4/25- current   Lines/Tubes: CVC 07/18/20, PIVs   Assessment 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   Rest of the management as per the primary team. Please call with questions or concerns.  Thank you for the consult  Rosiland Oz, MD Infectious Disease Physician [facility] [address] [place], [place] [address] Phone: [phone]  Fax: [phone]  __________________________________________________________________________________________________________ HPI and Hospital Course: 40 Y O Male with a PMH of heroin and metheamphetamine use and currently on suboxone who initially presented to [facility] on 4/26 for   altered mental status ( had fever and headache a week prior to presentation). Found to have hypotension requiring vasopressors, AKI, Thrombocytopenia, abnormal LFTS, lactic acidosis. UDS positive for  amphetamines. Patient was subsequently transferred to [facility] for higher level of care. Found to have serratia marcescens in blood cx. TTE and TEE s/o severe AVR, cusp perforation and large AV vegetation  History is taken from patient's mother at bedside as well as from chart review as patient is intubated. Mother says his son's girlfriend called EMS as he was altered. Patient works at [employer] and had passed out last 06/23/22 to which his employer had asked him to go to ED at [place]. However, he refused and was staying with his girlfriend at [employer]. He has a h/o HTN, no known surgeries, no metals/hardware/valve or joint replacement per mother. Smokes cigarettes, alcohol occasionally, has a h/o IVDU. Lives with mother and his brother.   ROS unavailable as patient is intubated   Past Medical History:  Diagnosis Date  . Ruptured lumbar disc    .History reviewed. No pertinent surgical history.    Scheduled Meds: . chlorhexidine  15 mL Mouth Rinse BID  . Chlorhexidine Gluconate Cloth  6 each Topical Daily  . docusate  100 mg Per Tube BID  . fentaNYL (SUBLIMAZE) injection  100 mcg Intravenous Once  . mouth rinse  15 mL Mouth Rinse q12n4p  . pantoprazole (PROTONIX) IV  40 mg Intravenous Q24H  . polyethylene glycol  17 g Per Tube Daily  . sodium chloride flush  10-40 mL Intracatheter Q12H  Continuous Infusions: . sodium chloride Stopped (07/20/20 0902)  . sodium chloride 10 mL/hr at 07/18/20 2300  . sodium chloride 20 mL/hr at 07/19/20 2000  . amiodarone    . ceFEPime (MAXIPIME) IV Stopped (07/20/20 [id number])  . fentaNYL infusion INTRAVENOUS 150 mcg/hr (07/20/20 1248)  . midazolam 3 mg/hr (07/20/20 1246)  . norepinephrine (LEVOPHED) Adult infusion 20 mcg/min (07/20/20 1100)  . vasopressin 0.04 Units/min (07/20/20 1100)   PRN Meds:.amiodarone, fentaNYL, midazolam, sodium chloride flush  No Known Allergies  Social History   Socioeconomic History  . Marital status: Single     Spouse name: Not on file  . Number of children: Not on file  . Years of education: Not on file  . Highest education level: Not on file  Occupational History  . Not on file  Tobacco Use  . Smoking status: Current Every Day Smoker    Packs/day: 1.00    Types: Cigarettes  . Smokeless tobacco: Never Used  Vaping Use  . Vaping Use: Never used  Substance and Sexual Activity  . Alcohol use: No  . Drug use: Yes    Types: Marijuana    Comment: denies use 08/24/17  . Sexual activity: Not on file  Other Topics Concern  . Not on file  Social History Narrative  . Not on file   Social Determinants of Health   Financial Resource Strain: Not on file  Food Insecurity: Not on file  Transportation Needs: Not on file  Physical Activity: Not on file  Stress: Not on file  Social Connections: Not on file  Intimate Partner Violence: Not on file   Family h/o denies   Vitals BP (!) 130/48   Pulse 90   Temp (!) 100.76 F (38.2 C)   Resp 18   Ht '6\' 4"'$  (1.93 m)   Wt 128.1 kg   SpO2 99%   BMI 34.38 kg/m     Physical Exam Young male lying in bed, intubated and sedated, on pressors Coarse breath sounds [id number], RRR, systolic murmur Abdomen - soft Extremities - cold bilateral toes Skin - no obvious rashes  Neuro - unable to assess  Pertinent Microbiology Results for orders placed or performed during the hospital encounter of 07/18/20  Resp Panel by RT-PCR (Flu A&B, Covid) Nasopharyngeal Swab     Status: None   Collection Time: 07/18/20  3:20 AM   Specimen: Nasopharyngeal Swab; Nasopharyngeal(NP) swabs in vial transport medium  Result Value Ref Range Status   SARS Coronavirus 2 by RT PCR NEGATIVE NEGATIVE Final    Comment: (NOTE) SARS-CoV-2 target nucleic acids are NOT DETECTED.  The SARS-CoV-2 RNA is generally detectable in upper respiratory specimens during the acute phase of infection. The lowest concentration of SARS-CoV-2 viral copies this assay can detect is 138 copies/mL. A  negative result does not preclude SARS-Cov-2 infection and should not be used as the sole basis for treatment or other patient management decisions. A negative result may occur with  improper specimen collection/handling, submission of specimen other than nasopharyngeal swab, presence of viral mutation(s) within the areas targeted by this assay, and inadequate number of viral copies(<138 copies/mL). A negative result must be combined with clinical observations, patient history, and epidemiological information. The expected result is Negative.  Fact Sheet for Patients:  EntrepreneurPulse.com.au  Fact Sheet for Healthcare Providers:  IncredibleEmployment.be  This test is no t yet approved or cleared by the [place] FDA and  has been authorized for detection and/or diagnosis of SARS-CoV-2 by FDA under an  Emergency Use Authorization (EUA). This EUA will remain  in effect (meaning this test can be used) for the duration of the COVID-19 declaration under Section 564(b)(1) of the Act, 21 U.S.C.section 360bbb-3(b)(1), unless the authorization is terminated  or revoked sooner.       Influenza A by PCR NEGATIVE NEGATIVE Final   Influenza B by PCR NEGATIVE NEGATIVE Final    Comment: (NOTE) The Xpert Xpress SARS-CoV-2/FLU/RSV plus assay is intended as an aid in the diagnosis of influenza from Nasopharyngeal swab specimens and should not be used as a sole basis for treatment. Nasal washings and aspirates are unacceptable for Xpert Xpress SARS-CoV-2/FLU/RSV testing.  Fact Sheet for Patients: EntrepreneurPulse.com.au  Fact Sheet for Healthcare Providers: IncredibleEmployment.be  This test is not yet approved or cleared by the [place] FDA and has been authorized for detection and/or diagnosis of SARS-CoV-2 by FDA under an Emergency Use Authorization (EUA). This EUA will remain in effect (meaning this test can  be used) for the duration of the COVID-19 declaration under Section 564(b)(1) of the Act, 21 U.S.C. section 360bbb-3(b)(1), unless the authorization is terminated or revoked.  Performed at [facility], [address] [place], [place] [address]   Culture, blood (single)     Status: Abnormal (Preliminary result)   Collection Time: 07/18/20  3:30 AM   Specimen: BLOOD RIGHT FOREARM  Result Value Ref Range Status   Specimen Description   Final    BLOOD RIGHT FOREARM Performed at [facility], [address], [place], [place] [address]    Special Requests   Final    BOTTLES DRAWN AEROBIC AND ANAEROBIC Blood Culture adequate volume Performed at [place] [place], [place] [address]    Culture  Setup Time   Final    GRAM NEGATIVE RODS IN BOTH AEROBIC AND ANAEROBIC BOTTLES Gram Stain Report Called to,Read Back By and Verified With: PITTS,R AT [facility] ICU AT [id number] ON 07/18/20 BY HUFFINES,S CRITICAL RESULT CALLED TO, READ BACK BY AND VERIFIED WITH: G ABBOTT PHARMD 07/19/20 0451 JDW    Culture (A)  Final    SERRATIA MARCESCENS SUSCEPTIBILITIES TO FOLLOW Performed at [facility], [place] [address] [place],  [address]    Report Status PENDING  Incomplete  Blood Culture ID Panel (Reflexed)     Status: Abnormal   Collection Time: 07/18/20  3:30 AM  Result Value Ref Range Status   Enterococcus faecalis NOT DETECTED NOT DETECTED Final   Enterococcus Faecium NOT DETECTED NOT DETECTED Final   Listeria monocytogenes NOT DETECTED NOT DETECTED Final   Staphylococcus species NOT DETECTED NOT DETECTED Final   Staphylococcus aureus (BCID) NOT DETECTED NOT DETECTED Final   Staphylococcus epidermidis NOT DETECTED NOT DETECTED Final   Staphylococcus lugdunensis NOT DETECTED NOT DETECTED Final   Streptococcus species NOT DETECTED NOT DETECTED Final   Streptococcus agalactiae NOT DETECTED NOT DETECTED Final   Streptococcus pneumoniae NOT DETECTED NOT DETECTED Final   Streptococcus  pyogenes NOT DETECTED NOT DETECTED Final   A.calcoaceticus-baumannii NOT DETECTED NOT DETECTED Final   Bacteroides fragilis NOT DETECTED NOT DETECTED Final   Enterobacterales DETECTED (A) NOT DETECTED Final    Comment: Enterobacterales represent a large order of gram negative bacteria, not a single organism. CRITICAL RESULT CALLED TO, READ BACK BY AND VERIFIED WITH: G ABBOTT PHARMD 07/19/20 0451 JDW    Enterobacter cloacae complex NOT DETECTED NOT DETECTED Final   Escherichia coli NOT DETECTED NOT DETECTED Final   Klebsiella aerogenes NOT DETECTED NOT DETECTED  Final   Klebsiella oxytoca NOT DETECTED NOT DETECTED Final   Klebsiella pneumoniae NOT DETECTED NOT DETECTED Final   Proteus species NOT DETECTED NOT DETECTED Final   Salmonella species NOT DETECTED NOT DETECTED Final   Serratia marcescens DETECTED (A) NOT DETECTED Final    Comment: CRITICAL RESULT CALLED TO, READ BACK BY AND VERIFIED WITH: G ABBOTT PHARMD 07/19/20 0451 JDW    Haemophilus influenzae NOT DETECTED NOT DETECTED Final   Neisseria meningitidis NOT DETECTED NOT DETECTED Final   Pseudomonas aeruginosa NOT DETECTED NOT DETECTED Final   Stenotrophomonas maltophilia NOT DETECTED NOT DETECTED Final   Candida albicans NOT DETECTED NOT DETECTED Final   Candida auris NOT DETECTED NOT DETECTED Final   Candida glabrata NOT DETECTED NOT DETECTED Final   Candida krusei NOT DETECTED NOT DETECTED Final   Candida parapsilosis NOT DETECTED NOT DETECTED Final   Candida tropicalis NOT DETECTED NOT DETECTED Final   Cryptococcus neoformans/gattii NOT DETECTED NOT DETECTED Final   CTX-M ESBL NOT DETECTED NOT DETECTED Final   Carbapenem resistance IMP NOT DETECTED NOT DETECTED Final   Carbapenem resistance KPC NOT DETECTED NOT DETECTED Final   Carbapenem resistance NDM NOT DETECTED NOT DETECTED Final   Carbapenem resist OXA 48 LIKE NOT DETECTED NOT DETECTED Final   Carbapenem resistance VIM NOT DETECTED NOT DETECTED Final    Comment:  Performed at [facility], [address] [place], [place] [address]  Urine culture     Status: Abnormal   Collection Time: 07/18/20  7:02 AM   Specimen: Urine, Catheterized  Result Value Ref Range Status   Specimen Description   Final    URINE, CATHETERIZED Performed at [facility], [address] [place], [place] [address]    Special Requests   Final    NONE Performed at [facility], [address], [place], [place] [address]    Culture (A)  Final    <10,000 COLONIES/mL INSIGNIFICANT GROWTH Performed at [facility] [address] [place], [place] [address]    Report Status 07/19/2020 FINAL  Final      Pertinent Lab seen by me: CBC Latest Ref Rng & Units 07/20/2020 07/19/2020 07/18/2020  WBC 4.0 - 10.5 K/uL 13.0(H) 13.1(H) 11.9(H)  Hemoglobin 13.0 - 17.0 g/dL 10.7(L) 11.5(L) 11.4(L)  Hematocrit 39.0 - 52.0 % 33.7(L) 35.5(L) 35.1(L)  Platelets 150 - 400 K/uL 22(LL) 32(L) 30(L)   CMP Latest Ref Rng & Units 07/20/2020 07/19/2020 07/18/2020  Glucose 70 - 99 mg/dL 114(H) 111(H) 109(H)  BUN 6 - 20 mg/dL 105(H) 76(H) 70(H)  Creatinine 0.61 - 1.24 mg/dL 7.19(H) 5.57(H) 5.37(H)  Sodium 135 - 145 mmol/L 133(L) 133(L) 132(L)  Potassium 3.5 - 5.1 mmol/L 4.7 4.4 4.5  Chloride 98 - 111 mmol/L 96(L) 96(L) 96(L)  CO2 22 - 32 mmol/L 21(L) 21(L) 20(L)  Calcium 8.9 - 10.3 mg/dL 8.8(L) 8.5(L) 8.3(L)  Total Protein 6.5 - 8.1 g/dL 6.0(L) 5.8(L) -  Total Bilirubin 0.3 - 1.2 mg/dL 8.1(H) 7.0(H) -  Alkaline Phos 38 - 126 U/L 179(H) 326(H) -  AST 15 - 41 U/L 44(H) 51(H) -  ALT 0 - 44 U/L 21 23 -     Pertinent Imagings/Other Imagings Plain films and CT images have been personally visualized and interpreted; radiology reports have been reviewed. Decision making incorporated into the Impression / Recommendations.  CT head WO contrast 07/18/20 FINDINGS: Brain: Normal anatomic configuration. No abnormal intra or extra-axial mass lesion or fluid collection. No abnormal mass effect or  midline  shift. No evidence of acute intracranial hemorrhage or infarct. Ventricular size is normal. Cerebellum unremarkable.  Vascular: No hyperdense vessel or unexpected calcification.  Skull: Intact  Sinuses/Orbits: Paranasal sinuses are clear. Orbits are unremarkable.  Other: Mastoid air cells and middle ear cavities are clear.  IMPRESSION: No acute intracranial abnormality.  CT abdomen/pelvis 07/14/20 FINDINGS: Lower chest: Mild bibasilar atelectasis. The visualized heart and pericardium are unremarkable.  Hepatobiliary: Cholelithiasis without pericholecystic inflammatory change. Mild hepatomegaly with the liver measuring 25 cm in craniocaudal dimension. Liver otherwise unremarkable. No intra or extrahepatic biliary ductal dilation.  Pancreas: Unremarkable  Spleen: Moderate splenomegaly with the spleen measuring up to 16.7 cm in greatest dimension. No definite intra splenic lesion identified on this noncontrast examination.  Adrenals/Urinary Tract: The adrenal glands are unremarkable. The kidneys are normal in size and position. 2 mm nonobstructing calculus within the lower pole of the left kidney. The kidneys are otherwise unremarkable. Foley catheter balloon is seen within the decompressed bladder lumen.  Stomach/Bowel: Stomach is within normal limits. Appendix appears normal. No evidence of bowel wall thickening, distention, or inflammatory changes. No free intraperitoneal gas or fluid.  Vascular/Lymphatic: No significant vascular findings are present. No enlarged abdominal or pelvic lymph nodes.  Reproductive: Prostate is unremarkable.  Other: No abdominal wall hernia.  Rectum unremarkable.  Musculoskeletal: No lytic or blastic bone lesion. No acute bone abnormality.  IMPRESSION: Mild to moderate hepatosplenomegaly.  Cholelithiasis without superimposed inflammatory change.  Minimal left nonobstructing nephrolithiasis.  TEE 07/20/20 1.  Left ventricular ejection fraction, by estimation, is 60 to 65%. The left ventricle has normal function. The left ventricle has no regional wall motion abnormalities. 2. Right ventricular systolic function is normal. The right ventricular size is normal. 3. No left atrial/left atrial appendage thrombus was detected. 4. The mitral valve is normal in structure. No evidence of mitral valve regurgitation. 5. Perforated left coronary cusp with severe aortic regurgitation. Large 36 x 24m mobile vegetation on ventricular surface of left coronary cusp. The aortic valve is abnormal. Aortic valve regurgitation is severe. 6. Intubated, ETT in place.  TTE 07/19/20 1. Left ventricular ejection fraction, by estimation, is 60 to 65%. The left ventricle has normal function. The left ventricle has no regional wall motion abnormalities. Left ventricular diastolic parameters were normal. 2. Right ventricular systolic function is normal. The right ventricular size is normal. Tricuspid regurgitation signal is inadequate for assessing PA pressure. 3. The mitral valve is normal in structure. No evidence of mitral valve regurgitation. 4. The aortic valve was not well visualized. Aortic valve regurgitation is severe. No aortic stenosis is present. Poor visualization of aortic valve but leaflets appears thickened, concerning for vegetation on AV causing eccentric AI that appears severe. Holodiastolic flow reveral is seen in thoracic aorta, consistent with severe AI. Recommend TEE for further evaluation.  I have spent 60 minutes for this patient encounter including review of prior medical records with greater than 50% of time being face to face and coordination of their care.  Electronically signed by:   SRosiland Oz MD Infectious Disease Physician [facility] Pager: [phone]
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
## 2020-07-21 DIAGNOSIS — R7989 Other specified abnormal findings of blood chemistry: Secondary | ICD-10-CM

## 2020-07-21 DIAGNOSIS — R4182 Altered mental status, unspecified: Secondary | ICD-10-CM | POA: Diagnosis not present

## 2020-07-21 DIAGNOSIS — I509 Heart failure, unspecified: Secondary | ICD-10-CM

## 2020-07-21 DIAGNOSIS — A419 Sepsis, unspecified organism: Secondary | ICD-10-CM | POA: Diagnosis not present

## 2020-07-21 DIAGNOSIS — I358 Other nonrheumatic aortic valve disorders: Secondary | ICD-10-CM

## 2020-07-21 DIAGNOSIS — G9340 Encephalopathy, unspecified: Secondary | ICD-10-CM | POA: Diagnosis not present

## 2020-07-21 DIAGNOSIS — F1510 Other stimulant abuse, uncomplicated: Secondary | ICD-10-CM | POA: Diagnosis not present

## 2020-07-21 DIAGNOSIS — A021 Salmonella sepsis: Secondary | ICD-10-CM

## 2020-07-21 DIAGNOSIS — N179 Acute kidney failure, unspecified: Secondary | ICD-10-CM | POA: Diagnosis not present

## 2020-07-21 DIAGNOSIS — R6521 Severe sepsis with septic shock: Secondary | ICD-10-CM | POA: Diagnosis not present

## 2020-07-21 LAB — RENAL FUNCTION PANEL
Albumin: 1.6 g/dL — ABNORMAL LOW (ref 3.5–5.0)
Albumin: 1.6 g/dL — ABNORMAL LOW (ref 3.5–5.0)
Anion gap: 12 (ref 5–15)
Anion gap: 10 (ref 5–15)
BUN: 91 mg/dL — ABNORMAL HIGH (ref 6–20)
BUN: 67 mg/dL — ABNORMAL HIGH (ref 6–20)
CO2: 23 mmol/L (ref 22–32)
CO2: 23 mmol/L (ref 22–32)
Calcium: 8.5 mg/dL — ABNORMAL LOW (ref 8.9–10.3)
Calcium: 8.2 mg/dL — ABNORMAL LOW (ref 8.9–10.3)
Chloride: 101 mmol/L (ref 98–111)
Chloride: 101 mmol/L (ref 98–111)
Creatinine, Ser: 5.8 mg/dL — ABNORMAL HIGH (ref 0.61–1.24)
Creatinine, Ser: 4.38 mg/dL — ABNORMAL HIGH (ref 0.61–1.24)
GFR, Estimated: 12 mL/min — ABNORMAL LOW (ref >60)
GFR, Estimated: 17 mL/min — ABNORMAL LOW (ref >60)
Glucose, Bld: 99 mg/dL (ref 70–99)
Glucose, Bld: 114 mg/dL — ABNORMAL HIGH (ref 70–99)
Phosphorus: 4.4 mg/dL (ref 2.5–4.6)
Phosphorus: 4.6 mg/dL (ref 2.5–4.6)
Potassium: 4.7 mmol/L (ref 3.5–5.1)
Potassium: 5 mmol/L (ref 3.5–5.1)
Sodium: 136 mmol/L (ref 135–145)
Sodium: 134 mmol/L — ABNORMAL LOW (ref 135–145)

## 2020-07-21 LAB — CULTURE, BLOOD (SINGLE): Special Requests: ADEQUATE

## 2020-07-21 LAB — CREATININE, SERUM
Creatinine, Ser: 5.92 mg/dL — ABNORMAL HIGH (ref 0.61–1.24)
GFR, Estimated: 12 mL/min — ABNORMAL LOW (ref >60)

## 2020-07-21 LAB — GLUCOSE, CAPILLARY
Glucose-Capillary: 104 mg/dL — ABNORMAL HIGH (ref 70–99)
Glucose-Capillary: 112 mg/dL — ABNORMAL HIGH (ref 70–99)
Glucose-Capillary: 83 mg/dL (ref 70–99)
Glucose-Capillary: 89 mg/dL (ref 70–99)
Glucose-Capillary: 90 mg/dL (ref 70–99)
Glucose-Capillary: 93 mg/dL (ref 70–99)

## 2020-07-21 LAB — CBC
HCT: 29.9 % — ABNORMAL LOW (ref 39.0–52.0)
Hemoglobin: 9.5 g/dL — ABNORMAL LOW (ref 13.0–17.0)
MCH: 27.4 pg (ref 26.0–34.0)
MCHC: 31.8 g/dL (ref 30.0–36.0)
MCV: 86.2 fL (ref 80.0–100.0)
Platelets: 19 10*3/uL — CL (ref 150–400)
RBC: 3.47 MIL/uL — ABNORMAL LOW (ref 4.22–5.81)
RDW: 16.9 % — ABNORMAL HIGH (ref 11.5–15.5)
WBC: 20.6 10*3/uL — ABNORMAL HIGH (ref 4.0–10.5)
nRBC: 0.1 % (ref 0.0–0.2)

## 2020-07-21 LAB — POCT I-STAT 7, (LYTES, BLD GAS, ICA,H+H)
Acid-Base Excess: 0 mmol/L (ref 0.0–2.0)
Bicarbonate: 25.3 mmol/L (ref 20.0–28.0)
Calcium, Ion: 1.11 mmol/L — ABNORMAL LOW (ref 1.15–1.40)
HCT: 27 % — ABNORMAL LOW (ref 39.0–52.0)
Hemoglobin: 9.2 g/dL — ABNORMAL LOW (ref 13.0–17.0)
O2 Saturation: 100 %
Patient temperature: 98
Potassium: 5 mmol/L (ref 3.5–5.1)
Sodium: 136 mmol/L (ref 135–145)
TCO2: 27 mmol/L (ref 22–32)
pCO2 arterial: 44.7 mmHg (ref 32.0–48.0)
pH, Arterial: 7.36 (ref 7.350–7.450)
pO2, Arterial: 183 mmHg — ABNORMAL HIGH (ref 83.0–108.0)

## 2020-07-21 LAB — CBC WITH DIFFERENTIAL/PLATELET
Abs Immature Granulocytes: 0.99 10*3/uL — ABNORMAL HIGH (ref 0.00–0.07)
Basophils Absolute: 0.1 10*3/uL (ref 0.0–0.1)
Basophils Relative: 0 %
Eosinophils Absolute: 0.1 10*3/uL (ref 0.0–0.5)
Eosinophils Relative: 1 %
HCT: 30.2 % — ABNORMAL LOW (ref 39.0–52.0)
Hemoglobin: 9.4 g/dL — ABNORMAL LOW (ref 13.0–17.0)
Immature Granulocytes: 6 %
Lymphocytes Relative: 11 %
Lymphs Abs: 1.6 10*3/uL (ref 0.7–4.0)
MCH: 27.1 pg (ref 26.0–34.0)
MCHC: 31.1 g/dL (ref 30.0–36.0)
MCV: 87 fL (ref 80.0–100.0)
Monocytes Absolute: 1.5 10*3/uL — ABNORMAL HIGH (ref 0.1–1.0)
Monocytes Relative: 10 %
Neutro Abs: 11.2 10*3/uL — ABNORMAL HIGH (ref 1.7–7.7)
Neutrophils Relative %: 72 %
Platelets: 14 10*3/uL — CL (ref 150–400)
RBC: 3.47 MIL/uL — ABNORMAL LOW (ref 4.22–5.81)
RDW: 16.9 % — ABNORMAL HIGH (ref 11.5–15.5)
WBC: 15.4 10*3/uL — ABNORMAL HIGH (ref 4.0–10.5)
nRBC: 0 % (ref 0.0–0.2)

## 2020-07-21 LAB — MRSA PCR SCREENING: MRSA by PCR: NEGATIVE

## 2020-07-21 LAB — MAGNESIUM: Magnesium: 3 mg/dL — ABNORMAL HIGH (ref 1.7–2.4)

## 2020-07-21 MED ORDER — PROSOURCE TF PO LIQD
45.0000 mL | Freq: Four times a day (QID) | ORAL | Status: DC
  Administered 2020-07-21 – 2020-07-26 (×22): 45 mL
  Filled 2020-07-21 (×21): qty 45

## 2020-07-21 MED ORDER — PRISMASOL BGK 0/2.5 32-2.5 MEQ/L EC SOLN
Status: DC
  Filled 2020-07-21 (×14): qty 5000

## 2020-07-21 MED ORDER — B COMPLEX-C PO TABS
1.0000 | ORAL_TABLET | Freq: Every day | ORAL | Status: DC
  Administered 2020-07-21 – 2020-07-26 (×6): 1
  Filled 2020-07-21 (×6): qty 1

## 2020-07-21 MED ORDER — PIVOT 1.5 CAL PO LIQD
1000.0000 mL | ORAL | Status: DC
  Administered 2020-07-21 – 2020-07-26 (×8): 1000 mL
  Filled 2020-07-21 (×12): qty 1000

## 2020-07-21 NOTE — Progress Notes (Signed)
After bath, patient became more hemodynamically unstable which prompted an increase on pressor. At that moment, MRI called in the hopes to attempt to bring patient down for the scan, and was notified about the presser requirement. MRI will call back for a second attempt.

## 2020-07-21 NOTE — Progress Notes (Signed)
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  Tee with large av pe 3s x 1.5 cm and severe AI. cts evaluated, not a surgical candidate at this time given multiorgan failure  Repeat bcx so far no growth  aki requiring crrt  On 3 pressors for septic shock  pulm opacity ards vs aspiration and chf  ams ?metabolic vs embolic. Clinically tenuous and can't perform proper imaging  Discussed with family  PLAN: 1. Would continue cefepime. 2. Supportive care per pulmccm 3. Ongoing goals of care discussion 4. hiv labs  5. Very poor prognosis from standpoint of multiorgan failure and also from primary endocarditis process  I spent more than 35 minute reviewing data/chart, and coordinating care and >50% direct face to face time providing counseling/discussing diagnostics/treatment plan with patient   Active Problems:   Amphetamine abuse ([place])   Sepsis ([place])   Septic shock ([place])   Altered mental status   Acute renal failure with oliguria ([place])   Elevated LFTs   Pressure injury of skin   No Known Allergies  Scheduled Meds: . B-complex with vitamin C  1 tablet Per Tube Daily  . chlorhexidine gluconate (MEDLINE KIT)  15 mL Mouth Rinse BID  . Chlorhexidine Gluconate Cloth  6 each Topical Daily  . docusate  100 mg Per Tube BID  . feeding supplement (PROSource TF)  45 mL Per Tube QID  . fentaNYL (SUBLIMAZE) injection  100 mcg Intravenous Once  . mouth rinse  15 mL Mouth Rinse 10 times per day  . pantoprazole (PROTONIX) IV  40  mg Intravenous Q24H  . polyethylene glycol  17 g Per Tube Daily  . sodium chloride flush  10-40 mL Intracatheter Q12H   Continuous Infusions: .  prismasol BGK 4/2.5 400 mL/hr at 07/21/20 0610  .  prismasol BGK 4/2.5 300 mL/hr at 07/21/20 1147  . sodium chloride 10 mL/hr at 07/21/20 1500  . sodium chloride 10 mL/hr at 07/18/20 2300  . sodium chloride 20 mL/hr at 07/19/20 2000  . amiodarone    . angiotensin II (GIAPREZA) infusion 22 ng/kg/min (07/21/20 1500)  . ceFEPime (MAXIPIME) IV Stopped (07/21/20 0650)  . epinephrine Stopped (07/20/20 2131)  . feeding supplement (PIVOT 1.5 CAL) 1,000 mL (07/21/20 1301)  . fentaNYL infusion INTRAVENOUS 100 mcg/hr (07/21/20 1500)  . midazolam Stopped (07/21/20 1201)  . norepinephrine (LEVOPHED) Adult infusion 22 mcg/min (07/21/20 1500)  . prismasol BGK 4/2.5 1,800 mL/hr at 07/21/20 1437  . vasopressin 0.04 Units/min (07/21/20 1500)   PRN Meds:.acetaminophen (TYLENOL) oral liquid 160 mg/5 mL, acetaminophen, amiodarone, fentaNYL, heparin, midazolam, sodium chloride flush   SUBJECTIVE: Patient remains comatose on 3 pressors Rising wbc Fever ?improving although getting scheduled tylenol Not surgical candidate crrt started 4/28 for aki Platelet continues to drop  Review of Systems: ROS All other ROS was negative, except mentioned above     OBJECTIVE: Vitals:   07/21/20 1430 07/21/20 1445 07/21/20 1500 07/21/20 1515  BP:   (!) 126/44   Pulse: 79 77 77 81  Resp: 16 17 16  15  Temp: 98.96 F (37.2 C)  98.2 F (36.8 C)   TempSrc:      SpO2: 100% 100% 100% 100%  Weight:      Height:       Body mass index is 33.95 kg/m.  Physical Exam Acutely ill apearing, intubated, on 3 pressors Normocephalic; per; ett intact Neck left side hd line no purulence/swelling cv tachy/systolic murmur pulm clear on vent Ext no edema Skin no rash msk no peripheral joint swelling  Lines: Right SCV line site no purulence/swelling/redness LUE a-line  site no purulence/swelling/redness  Lab Results Lab Results  Component Value Date   WBC 15.4 (H) 07/21/2020   HGB 9.4 (L) 07/21/2020   HCT 30.2 (L) 07/21/2020   MCV 87.0 07/21/2020   PLT 14 (LL) 07/21/2020    Lab Results  Component Value Date   CREATININE 5.92 (H) 07/21/2020   CREATININE 5.80 (H) 07/21/2020   BUN 91 (H) 07/21/2020   NA 136 07/21/2020   K 4.7 07/21/2020   CL 101 07/21/2020   CO2 23 07/21/2020    Lab Results  Component Value Date   ALT 21 07/20/2020   AST 44 (H) 07/20/2020   ALKPHOS 179 (H) 07/20/2020   BILITOT 8.1 (H) 07/20/2020      Microbiology: Recent Results (from the past 240 hour(s))  Resp Panel by RT-PCR (Flu A&B, Covid) Nasopharyngeal Swab     Status: None   Collection Time: 07/18/20  3:20 AM   Specimen: Nasopharyngeal Swab; Nasopharyngeal(NP) swabs in vial transport medium  Result Value Ref Range Status   SARS Coronavirus 2 by RT PCR NEGATIVE NEGATIVE Final    Comment: (NOTE) SARS-CoV-2 target nucleic acids are NOT DETECTED.  The SARS-CoV-2 RNA is generally detectable in upper respiratory specimens during the acute phase of infection. The lowest concentration of SARS-CoV-2 viral copies this assay can detect is 138 copies/mL. A negative result does not preclude SARS-Cov-2 infection and should not be used as the sole basis for treatment or other patient management decisions. A negative result may occur with  improper specimen collection/handling, submission of specimen other than nasopharyngeal swab, presence of viral mutation(s) within the areas targeted by this assay, and inadequate number of viral copies(<138 copies/mL). A negative result must be combined with clinical observations, patient history, and epidemiological information. The expected result is Negative.  Fact Sheet for Patients:  EntrepreneurPulse.com.au  Fact Sheet for Healthcare Providers:  IncredibleEmployment.be  This test is no t  yet approved or cleared by the [place] FDA and  has been authorized for detection and/or diagnosis of SARS-CoV-2 by FDA under an Emergency Use Authorization (EUA). This EUA will remain  in effect (meaning this test can be used) for the duration of the COVID-19 declaration under Section 564(b)(1) of the Act, 21 U.S.C.section 360bbb-3(b)(1), unless the authorization is terminated  or revoked sooner.       Influenza A by PCR NEGATIVE NEGATIVE Final   Influenza B by PCR NEGATIVE NEGATIVE Final    Comment: (NOTE) The Xpert Xpress SARS-CoV-2/FLU/RSV plus assay is intended as an aid in the diagnosis of influenza from Nasopharyngeal swab specimens and should not be used as a sole basis for treatment. Nasal washings and aspirates are unacceptable for Xpert Xpress SARS-CoV-2/FLU/RSV testing.  Fact Sheet for Patients: EntrepreneurPulse.com.au  Fact Sheet for Healthcare Providers: IncredibleEmployment.be  This test is not yet approved or cleared by the [place] FDA and has been authorized for detection and/or diagnosis of SARS-CoV-2 by FDA under an Emergency Use Authorization (  EUA). This EUA will remain in effect (meaning this test can be used) for the duration of the COVID-19 declaration under Section 564(b)(1) of the Act, 21 U.S.C. section 360bbb-3(b)(1), unless the authorization is terminated or revoked.  Performed at [facility], [address] [place], [place] [address]   Culture, blood (single)     Status: Abnormal   Collection Time: 07/18/20  3:30 AM   Specimen: BLOOD RIGHT FOREARM  Result Value Ref Range Status   Specimen Description   Final    BLOOD RIGHT FOREARM Performed at [facility], [address] [place], [place] [address]    Special Requests   Final    BOTTLES DRAWN AEROBIC AND ANAEROBIC Blood Culture adequate volume Performed at [facility], [address] [place], [place] [address]    Culture  Setup Time   Final     GRAM NEGATIVE RODS IN BOTH AEROBIC AND ANAEROBIC BOTTLES Gram Stain Report Called to,Read Back By and Verified With: PITTS,R AT [facility] ICU AT 3532 ON 07/18/20 BY HUFFINES,S CRITICAL RESULT CALLED TO, READ BACK BY AND VERIFIED WITH: [facility] 07/19/20 0451 JDW Performed at [facility], [address] [place], [place] [address]    Culture SERRATIA MARCESCENS (A)  Final   Report Status 07/21/2020 FINAL  Final   Organism ID, Bacteria SERRATIA MARCESCENS  Final      Susceptibility   Serratia marcescens - MIC*    CEFAZOLIN >=64 RESISTANT Resistant     CEFEPIME <=0.12 SENSITIVE Sensitive     CEFTAZIDIME <=1 SENSITIVE Sensitive     CEFTRIAXONE <=0.25 SENSITIVE Sensitive     CIPROFLOXACIN <=0.25 SENSITIVE Sensitive     GENTAMICIN <=1 SENSITIVE Sensitive     TRIMETH/SULFA <=20 SENSITIVE Sensitive     * SERRATIA MARCESCENS  Blood Culture ID Panel (Reflexed)     Status: Abnormal   Collection Time: 07/18/20  3:30 AM  Result Value Ref Range Status   Enterococcus faecalis NOT DETECTED NOT DETECTED Final   Enterococcus Faecium NOT DETECTED NOT DETECTED Final   Listeria monocytogenes NOT DETECTED NOT DETECTED Final   Staphylococcus species NOT DETECTED NOT DETECTED Final   Staphylococcus aureus (BCID) NOT DETECTED NOT DETECTED Final   Staphylococcus epidermidis NOT DETECTED NOT DETECTED Final   Staphylococcus lugdunensis NOT DETECTED NOT DETECTED Final   Streptococcus species NOT DETECTED NOT DETECTED Final   Streptococcus agalactiae NOT DETECTED NOT DETECTED Final   Streptococcus pneumoniae NOT DETECTED NOT DETECTED Final   Streptococcus pyogenes NOT DETECTED NOT DETECTED Final   A.calcoaceticus-baumannii NOT DETECTED NOT DETECTED Final   Bacteroides fragilis NOT DETECTED NOT DETECTED Final   Enterobacterales DETECTED (A) NOT DETECTED Final    Comment: Enterobacterales represent a large order of gram negative bacteria, not a single organism. CRITICAL RESULT CALLED TO, READ BACK BY AND  VERIFIED WITH: G ABBOTT PHARMD 07/19/20 0451 JDW    Enterobacter cloacae complex NOT DETECTED NOT DETECTED Final   Escherichia coli NOT DETECTED NOT DETECTED Final   Klebsiella aerogenes NOT DETECTED NOT DETECTED Final   Klebsiella oxytoca NOT DETECTED NOT DETECTED Final   Klebsiella pneumoniae NOT DETECTED NOT DETECTED Final   Proteus species NOT DETECTED NOT DETECTED Final   Salmonella species NOT DETECTED NOT DETECTED Final   Serratia marcescens DETECTED (A) NOT DETECTED Final    Comment: CRITICAL RESULT CALLED TO, READ BACK BY AND VERIFIED WITH: G ABBOTT PHARMD 07/19/20 0451 JDW    Haemophilus influenzae NOT DETECTED NOT DETECTED Final   Neisseria meningitidis  NOT DETECTED NOT DETECTED Final   Pseudomonas aeruginosa NOT DETECTED NOT DETECTED Final   Stenotrophomonas maltophilia NOT DETECTED NOT DETECTED Final   Candida albicans NOT DETECTED NOT DETECTED Final   Candida auris NOT DETECTED NOT DETECTED Final   Candida glabrata NOT DETECTED NOT DETECTED Final   Candida krusei NOT DETECTED NOT DETECTED Final   Candida parapsilosis NOT DETECTED NOT DETECTED Final   Candida tropicalis NOT DETECTED NOT DETECTED Final   Cryptococcus neoformans/gattii NOT DETECTED NOT DETECTED Final   CTX-M ESBL NOT DETECTED NOT DETECTED Final   Carbapenem resistance IMP NOT DETECTED NOT DETECTED Final   Carbapenem resistance KPC NOT DETECTED NOT DETECTED Final   Carbapenem resistance NDM NOT DETECTED NOT DETECTED Final   Carbapenem resist OXA 48 LIKE NOT DETECTED NOT DETECTED Final   Carbapenem resistance VIM NOT DETECTED NOT DETECTED Final    Comment: Performed at [facility], [address] [place], [place] [address]  Urine culture     Status: Abnormal   Collection Time: 07/18/20  7:02 AM   Specimen: Urine, Catheterized  Result Value Ref Range Status   Specimen Description   Final    URINE, CATHETERIZED Performed at [facility], [address] [place], [place] [address]    Special  Requests   Final    NONE Performed at [facility], [address], [place], [place] [address]    Culture (A)  Final    <10,000 COLONIES/mL INSIGNIFICANT GROWTH Performed at [facility] [address], [place], [place] [address]    Report Status 07/19/2020 FINAL  Final  Culture, blood (routine x 2)     Status: None (Preliminary result)   Collection Time: 07/20/20  2:20 PM   Specimen: BLOOD RIGHT HAND  Result Value Ref Range Status   Specimen Description BLOOD RIGHT HAND  Final   Special Requests   Final    BOTTLES DRAWN AEROBIC AND ANAEROBIC Blood Culture adequate volume   Culture   Final    NO GROWTH < 24 HOURS Performed at [facility], [place] [address] [place], [place] [address]    Report Status PENDING  Incomplete  Culture, blood (routine x 2)     Status: None (Preliminary result)   Collection Time: 07/20/20  2:25 PM   Specimen: BLOOD RIGHT HAND  Result Value Ref Range Status   Specimen Description BLOOD RIGHT HAND  Final   Special Requests   Final    BOTTLES DRAWN AEROBIC ONLY Blood Culture results may not be optimal due to an inadequate volume of blood received in culture bottles   Culture   Final    NO GROWTH < 24 HOURS Performed at [facility], [place] [address], [place], [place] [address]    Report Status PENDING  Incomplete  MRSA PCR Screening     Status: None   Collection Time: 07/21/20  8:18 AM   Specimen: Nasal Mucosa; Nasopharyngeal  Result Value Ref Range Status   MRSA by PCR NEGATIVE NEGATIVE Final    Comment:        The GeneXpert MRSA Assay (FDA approved for NASAL specimens only), is one component of a comprehensive MRSA colonization surveillance program. It is not intended to diagnose MRSA infection nor to guide or monitor treatment for MRSA infections. Performed at [facility], [place] [address] [place], [place] [address]      Serology: Acute hep panel negative  Micro: 4/28 bcx ngtd 4/26 bcx serratia marcescens (  s  cipro/bactrim)  Imaging: If present, new imagings (plain films, ct scans, and mri) have been personally visualized and interpreted; radiology reports have been reviewed. Decision making incorporated into the Impression / Recommendations.  4/28 tee 1. Left ventricular ejection fraction, by estimation, is 60 to 65%. The  left ventricle has normal function. The left ventricle has no regional  wall motion abnormalities.  2. Right ventricular systolic function is normal. The right ventricular  size is normal.  3. No left atrial/left atrial appendage thrombus was detected.  4. The mitral valve is normal in structure. No evidence of mitral valve  regurgitation.  5. Perforated left coronary cusp with severe aortic regurgitation. Large  36 x 73m mobile vegetation on ventricular surface of left coronary cusp.  The aortic valve is abnormal. Aortic valve regurgitation is severe.  6. Intubated, ETT in place.    4/28 cxr 1. New left internal jugular dialysis catheter with tip in the proximal SVC. No pneumothorax. 2. Unchanged multifocal airspace disease.  4/26 ct abd pelv noncontrast  Mild to moderate hepatosplenomegaly.  Cholelithiasis without superimposed inflammatory change.  Minimal left nonobstructing nephrolithiasis.   4/26 ct head noncontrast No acute intracranial abnormality.   TJabier Mutton MEidson Roadfor Infectious [phone]    07/21/2020, 3:43 PM

## 2020-07-21 NOTE — Progress Notes (Signed)
[facility] NEPHROLOGY PROGRESS NOTE  Assessment/ Plan: Pt is a 40 y.o. yo male with history of substance abuse, ruptured disc who was initially presented to ER with altered mental status found to be septic shock and Serratia bacteremia/AV endocarditis seen as a consultation for AKI requiring CRRT.  #Anuric acute kidney injury likely ischemic ATN in the setting of septic shock/septic emboli.  Kidney ultrasound without hydronephrosis. Started CRRT on 4/28.  On 4K bath and tolerating well.  We will change UF to around 50 cc an hour net negative.  Discussed with ICU nurse.  Daily lab, strict ins and out.  #Serratia more concerns bacteremia/AV endocarditis: With history of substance abuse.  Seen by ID, on IV cefepime.  #Septic shock: Currently on vasopressin, Levophed and angiotensin IV.  Monitor blood pressure.  Antibiotics as above.  #Acute encephalopathy due to sepsis: Monitor mental status.  #Thrombocytopenia and anemia probably because of sepsis and acute illness.  Monitor lab.  Subjective: Seen and examined in ICU.  Remains on 3 pressors.  Urine output recorded only 90 cc.  On CRRT and tolerating well. Objective Vital signs in last 24 hours: Vitals:   07/21/20 0815 07/21/20 0830 07/21/20 0845 07/21/20 0900  BP:    (!) 127/47  Pulse: 76 77 80 79  Resp: _0 Temp: (!) 97.34 F (36.3 C) (!) 97.34 F (36.3 C) (!) 97.52 F (36.4 C) 97.7 F (36.5 C)  TempSrc:      SpO2: 99% 99% 99% 100%  Weight:      Height:       Weight change: -1.6 kg  Intake/Output Summary (Last 24 hours) at 07/21/2020 0943 Last data filed at 07/21/2020 0900 Gross per 24 hour  Intake 1851.08 ml  Output 1071 ml  Net 780.08 ml       Labs: Basic Metabolic Panel: Recent Labs  Lab 07/20/20 0246 07/20/20 2032 07/21/20 0325  NA 133* 136 136  K 4.7 4.9 4.7  CL 96* 100 101  CO2 21* 21* 23  GLUCOSE 114* 95 99  BUN 105* 112* 91*  CREATININE 7.19* 7.08* 5.80*  5.92*  CALCIUM 8.8*  8.1* 8.5*  PHOS 4.9* 4.9*  4.8* 4.4   Liver Function Tests: Recent Labs  Lab 07/18/20 1351 07/19/20 0140 07/20/20 0246 07/20/20 2032 07/21/20 0325  AST 58* 51* 44*  --   --   ALT _1 --   --   ALKPHOS 569* 326* 179*  --   --   BILITOT 6.0* 7.0* 8.1*  --   --   PROT 6.0* 5.8* 6.0*  --   --   ALBUMIN 1.9* 1.6* 1.6* 1.5* 1.6*   No results for input(s): LIPASE, AMYLASE in the last 168 hours. Recent Labs  Lab 07/18/20 0317  AMMONIA 22   CBC: Recent Labs  Lab 07/18/20 0200 07/18/20 1135 07/18/20 1351 07/18/20 2039 07/19/20 0140 07/20/20 0246  WBC 10.9* 10.3 14.0* 11.9* 13.1* 13.0*  NEUTROABS 9.4*  --   --   --   --   --   HGB 13.1 11.3* 11.4* 11.4* 11.5* 10.7*  HCT 40.8 35.7* 36.1* 35.1* 35.5* 33.7*  MCV 86.6 87.7 86.8 85.2 85.1 86.2  PLT 49* 31* 33*  35* 30* 32* 22*   Cardiac Enzymes: Recent Labs  Lab 07/18/20 0330  CKTOTAL 79   CBG: Recent Labs  Lab 07/20/20 1508 07/20/20 1959 07/21/20 0018 07/21/20 0334 07/21/20 0740  GLUCAP 97 87 83 90 89  Iron Studies:  Recent Labs    07/18/20 1138  IRON 50  TIBC 162*  FERRITIN 1,515*   Studies/Results: DG CHEST PORT 1 VIEW  Result Date: 07/20/2020 CLINICAL DATA:  Central line placement. EXAM: PORTABLE CHEST 1 VIEW COMPARISON:  Chest x-ray from same day at 12:09 p.m. FINDINGS: New left internal jugular dialysis catheter with tip in the proximal SVC. Unchanged right subclavian central venous catheter. Endotracheal tube remains in good position with the tip 3.7 cm above the carina. Normal heart size. Patchy airspace disease throughout both lungs, worse on the right, not significantly changed. No pleural effusion or pneumothorax. No acute osseous abnormality. IMPRESSION: 1. New left internal jugular dialysis catheter with tip in the proximal SVC. No pneumothorax. 2. Unchanged multifocal airspace disease. Electronically Signed   By: Titus Dubin M.D.   On: 07/20/2020 18:29   DG CHEST PORT 1 VIEW  Result  Date: 07/20/2020 CLINICAL DATA:  Status post intubation. EXAM: PORTABLE CHEST 1 VIEW COMPARISON:  Single-view of the chest 07/19/2020. FINDINGS: Endotracheal tube is in place with the tip in good position at the level the clavicular heads. Right subclavian catheter is unchanged. New patchy bilateral airspace disease is much worse on the right. Heart size is enlarged. No pneumothorax. IMPRESSION: ETT in good position. Right much worse than left patchy airspace disease worrisome for pneumonia. Electronically Signed   By: Inge Rise M.D.   On: 07/20/2020 12:48   ECHOCARDIOGRAM COMPLETE  Result Date: 07/19/2020    ECHOCARDIOGRAM REPORT   Patient Name:   Alex [name] Date of Exam: 07/19/2020 Medical Rec #:  [id number]         Height:       76.0 in Accession #:    [id number]        Weight:       287.0 lb Date of Birth:  Sep 04, 1980         BSA:          2.583 m Patient Age:    39 years          BP:           115/48 mmHg Patient Gender: M                 HR:           85 bpm. Exam Location:  Inpatient Procedure: 2D Echo, Cardiac Doppler and Color Doppler  Results discussed with Dr [name] at [phone]. Indications:     Shock  History:         Patient has no prior history of Echocardiogram examinations.                  Risk Factors:Current Smoker.  Sonographer:     Cammy Brochure Referring Phys:  [place] Diagnosing Phys: Oswaldo Milian MD IMPRESSIONS  1. Left ventricular ejection fraction, by estimation, is 60 to 65%. The left ventricle has normal function. The left ventricle has no regional wall motion abnormalities. Left ventricular diastolic parameters were normal.  2. Right ventricular systolic function is normal. The right ventricular size is normal. Tricuspid regurgitation signal is inadequate for assessing PA pressure.  3. The mitral valve is normal in structure. No evidence of mitral valve regurgitation.  4. The aortic valve was not well visualized. Aortic valve regurgitation is severe.  No aortic stenosis is present. Poor visualization of aortic valve but leaflets appears thickened, concerning for vegetation on AV causing eccentric AI that appears severe. Holodiastolic flow  reveral is seen in thoracic aorta, consistent with severe AI. Recommend TEE for further evaluation. FINDINGS  Left Ventricle: Left ventricular ejection fraction, by estimation, is 60 to 65%. The left ventricle has normal function. The left ventricle has no regional wall motion abnormalities. The left ventricular internal cavity size was normal in size. There is  no left ventricular hypertrophy. Left ventricular diastolic parameters were normal. Right Ventricle: The right ventricular size is normal. No increase in right ventricular wall thickness. Right ventricular systolic function is normal. Tricuspid regurgitation signal is inadequate for assessing PA pressure. Left Atrium: Left atrial size was normal in size. Right Atrium: Right atrial size was not well visualized. Pericardium: There is no evidence of pericardial effusion. Mitral Valve: The mitral valve is normal in structure. No evidence of mitral valve regurgitation. Tricuspid Valve: The tricuspid valve is normal in structure. Tricuspid valve regurgitation is trivial. Aortic Valve: The aortic valve was not well visualized. Aortic valve regurgitation is severe. No aortic stenosis is present. Aortic valve mean gradient measures 8.0 mmHg. Aortic valve peak gradient measures 13.8 mmHg. Aortic valve area, by VTI measures 3.72 cm. Pulmonic Valve: The pulmonic valve was not well visualized. Pulmonic valve regurgitation is not visualized. Aorta: The aortic root is normal in size and structure. IAS/Shunts: The interatrial septum was not well visualized.  LEFT VENTRICLE PLAX 2D LVOT diam:     2.40 cm  Diastology LV SV:         117      LV e' medial:    9.68 cm/s LV SV Index:   45       LV E/e' medial:  6.6 LVOT Area:     4.52 cm LV e' lateral:   11.50 cm/s                          LV E/e' lateral: 5.6  RIGHT VENTRICLE             IVC RV Basal diam:  3.90 cm     IVC diam: 1.50 cm RV S prime:     15.90 cm/s TAPSE (M-mode): 2.5 cm LEFT ATRIUM             Index       RIGHT ATRIUM          Index LA diam:        3.80 cm 1.47 cm/m  RA Area:     9.47 cm LA Vol (A2C):   40.4 ml 15.64 ml/m RA Volume:   18.60 ml 7.20 ml/m LA Vol (A4C):   50.1 ml 19.40 ml/m LA Biplane Vol: 49.3 ml 19.09 ml/m  AORTIC VALVE AV Area (Vmax):    4.21 cm AV Area (Vmean):   3.53 cm AV Area (VTI):     3.72 cm AV Vmax:           186.00 cm/s AV Vmean:          137.000 cm/s AV VTI:            0.315 m AV Peak Grad:      13.8 mmHg AV Mean Grad:      8.0 mmHg LVOT Vmax:         173.00 cm/s LVOT Vmean:        107.000 cm/s LVOT VTI:          0.259 m LVOT/AV VTI ratio: 0.82  AORTA Ao Root diam: 3.40 cm MITRAL VALVE MV Area (PHT): 5.13 cm  SHUNTS MV Decel Time: 148 msec    Systemic VTI:  0.26 m MV E velocity: 64.00 cm/s  Systemic Diam: 2.40 cm MV A velocity: 47.50 cm/s MV E/A ratio:  1.35 Oswaldo Milian MD Electronically signed by Oswaldo Milian MD Signature Date/Time: 07/19/2020/11:54:58 AM    Final (Updated)    ECHO TEE  Result Date: 07/20/2020    TRANSESOPHOGEAL ECHO REPORT   Patient Name:   Alex [name] Date of Exam: 07/20/2020 Medical Rec #:  [id number]         Height:       76.0 in Accession #:    [id number]        Weight:       282.4 lb Date of Birth:  March 07, 1981         BSA:          2.565 m Patient Age:    39 years          BP:           130/48 mmHg Patient Gender: M                 HR:           90 bpm. Exam Location:  Inpatient Portions of this table do not appear on this page. Procedure: Transesophageal Echo, 3D Echo, Cardiac Doppler and Color Doppler Indications:    Bacteremia 790.7 / R78.81  History:        Patient has prior history of Echocardiogram examinations, most                 recent 07/19/2020. Polysubstance abuse.  Sonographer:    Darlina Sicilian RDCS Referring Phys: [phone] JILL D  MCDANIEL PROCEDURE: After discussion of the risks and benefits of a TEE, an informed consent was obtained from a family member. The transesophogeal probe was passed without difficulty through the esophogus of the patient. Sedation performed by performing physician. Patients was under conscious sedation during this procedure. Image quality was excellent. The patient's vital signs; including heart rate, blood pressure, and oxygen saturation; remained stable throughout the procedure. The patient developed no complications during the procedure. Intubated, ETT in place. IMPRESSIONS  1. Left ventricular ejection fraction, by estimation, is 60 to 65%. The left ventricle has normal function. The left ventricle has no regional wall motion abnormalities.  2. Right ventricular systolic function is normal. The right ventricular size is normal.  3. No left atrial/left atrial appendage thrombus was detected.  4. The mitral valve is normal in structure. No evidence of mitral valve regurgitation.  5. Perforated left coronary cusp with severe aortic regurgitation. Large 36 x 69m mobile vegetation on ventricular surface of left coronary cusp. The aortic valve is abnormal. Aortic valve regurgitation is severe.  6. Intubated, ETT in place. 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 FINDINGS  Left Ventricle: Left ventricular ejection fraction, by estimation, is 60 to 65%. The left ventricle has normal function. The left ventricle has no regional wall motion abnormalities. The left ventricular internal cavity size was normal in size. Right Ventricle: The right ventricular size is normal. No increase in right ventricular wall thickness. Right ventricular systolic function is normal. Left Atrium: Left atrial size was normal in size. No left atrial/left atrial appendage thrombus was detected. Right Atrium: Right atrial size was normal in size. Pericardium: There is no evidence of  pericardial effusion. Mitral Valve: The mitral valve is normal in structure. No evidence of mitral valve regurgitation. Tricuspid Valve: The  tricuspid valve is normal in structure. Tricuspid valve regurgitation is not demonstrated. Aortic Valve: Perforated left coronary cusp with severe aortic regurgitation. Large 36 x 27m mobile vegetation on ventricular surface of left coronary cusp. The aortic valve is abnormal. Aortic valve regurgitation is severe. Aortic valve mean gradient measures 2.0 mmHg. Aortic valve peak gradient measures 3.2 mmHg. Aortic valve area, by VTI measures 9.21 cm. A mobile vegetation is seen. The AoV vegetation measures 30 mm x 15 mm. Pulmonic Valve: The pulmonic valve was normal in structure. Pulmonic valve regurgitation is not visualized. Aorta: The aortic root was not well visualized. Venous: The inferior vena cava was not well visualized. IAS/Shunts: No atrial level shunt detected by color flow Doppler.  LEFT VENTRICLE PLAX 2D LVOT diam:     2.70 cm LV SV:         148 LV SV Index:   58 LVOT Area:     5.73 cm  AORTIC VALVE AV Area (Vmax):    11.08 cm AV Area (Vmean):   8.97 cm AV Area (VTI):     9.21 cm AV Vmax:           89.40 cm/s AV Vmean:          68.300 cm/s AV VTI:            0.161 m AV Peak Grad:      3.2 mmHg AV Mean Grad:      2.0 mmHg LVOT Vmax:         173.00 cm/s LVOT Vmean:        107.000 cm/s LVOT VTI:          0.259 m LVOT/AV VTI ratio: 1.61  SHUNTS Systemic VTI:  0.26 m Systemic Diam: 2.70 cm MCandee FurbishMD Electronically signed by MCandee FurbishMD Signature Date/Time: 07/20/2020/1:43:49 PM    Final     Medications: Infusions: .  prismasol BGK 4/2.5 400 mL/hr at 07/21/20 0610  .  prismasol BGK 4/2.5 300 mL/hr at 07/20/20 1836  . sodium chloride 10 mL/hr at 07/21/20 0900  . sodium chloride 10 mL/hr at 07/18/20 2300  . sodium chloride 20 mL/hr at 07/19/20 2000  . amiodarone    . angiotensin II (GIAPREZA) infusion 30 ng/kg/min (07/21/20 0900)  . ceFEPime  (MAXIPIME) IV Stopped (07/21/20 0650)  . epinephrine Stopped (07/20/20 2131)  . fentaNYL infusion INTRAVENOUS 50 mcg/hr (07/21/20 0900)  . midazolam Stopped (07/21/20 0717)  . norepinephrine (LEVOPHED) Adult infusion 16 mcg/min (07/21/20 0900)  . prismasol BGK 4/2.5 1,800 mL/hr at 07/21/20 0854  . vasopressin 0.04 Units/min (07/21/20 0900)    Scheduled Medications: . chlorhexidine gluconate (MEDLINE KIT)  15 mL Mouth Rinse BID  . Chlorhexidine Gluconate Cloth  6 each Topical Daily  . docusate  100 mg Per Tube BID  . fentaNYL (SUBLIMAZE) injection  100 mcg Intravenous Once  . mouth rinse  15 mL Mouth Rinse 10 times per day  . pantoprazole (PROTONIX) IV  40 mg Intravenous Q24H  . polyethylene glycol  17 g Per Tube Daily  . sodium chloride flush  10-40 mL Intracatheter Q12H    have reviewed scheduled and prn medications.  Physical Exam: General: Ill-looking male intubated and sedated Heart:RRR, s1s2 nl Lungs: Coarse breath sound bilateral Abdomen:soft, Non-tender, non-distended Extremities: Trace LE edema Dialysis Access: IJ temporary HD catheter placed by ICU on 4/28   Prasad  07/21/2020,9:43 AM  LOS: 3 days

## 2020-07-21 NOTE — Progress Notes (Signed)
SBT attempted with pt. Pt placed on CPAP/PS of 12/5 40% and was weaned to 10/5 40%. Pt placed back into full support for increased work of breathing and tachypnea. Pt lasted 12 minutes. RN at bedside and notified of changes. RT will continue to monitor and be available as needed.

## 2020-07-21 NOTE — Progress Notes (Signed)
NAME:  Alex [name], MRN:  [id number], DOB:  Apr 18, 1980, LOS: 3 ADMISSION DATE:  07/18/2020, CONSULTATION DATE:  4/26 REFERRING MD:  Dr. Florina Ou, CHIEF COMPLAINT:  AMS   History of Present Illness:  40 yo male smoker brought to [facility] ER with altered mental status.  Noted to have headache and feeling feverish before admission.  Found to have hypotension, AKI, thrombocytopenia, splenomegaly, lactic acidosis, elevated bilirubin, hypoglycemia.  Started on antibiotics and pressors for possible sepsis, but no clear source of infection.  Normal renal function from February 2022.  Uses suboxone for history of substance abuse.  UDS positive for amphetamines.  Transfer to [facility] for further therapy.  Hx from chart, medical team, and pt's mother.  Pertinent  Medical History  Polysubstance Abuse - amphetamines, heroin. On Suboxone.   Ruptured Disc  Significant Hospital Events: Including procedures, antibiotic start and stop dates in addition to other pertinent events   . 4/26 transfer from [facility] to [facility] . CT head 4/26 >> no acute findings . CT abd/pelvis 4/26 >> cholelithiasis w/o inflammatory chagnes, mild hepatomegaly (25 cm), moderate splenomegaly (16.7 cm), 2 mm non obstructing kidney stone on Lt . 4/27 CVL placed. Pressors escalating.  . 4/28 intubated for TEE, CVC, HD, and A-line placed. CRRT started   Interim History / Subjective:  No acute issues overnight  Remains on CRRT +8L  Objective   Blood pressure (!) 118/42, pulse 80, temperature (!) 97.52 F (36.4 C), resp. rate 15, height '6\' 4"'$  (1.93 m), weight 126.5 kg, SpO2 99 %.    Vent Mode: PRVC FiO2 (%):  [80 %-100 %] 80 % Set Rate:  [15 bmp] 15 bmp Vt Set:  [600 mL] 600 mL PEEP:  [10 cmH20] 10 cmH20 Plateau Pressure:  [19 cmH20-20 cmH20] 19 cmH20   Intake/Output Summary (Last 24 hours) at 07/21/2020 0850 Last data filed at 07/21/2020 0800 Gross per 24 hour  Intake 1813.83 ml  Output 967 ml  Net 846.83 ml   Filed Weights   07/19/20  0400 07/20/20 0130 07/21/20 0431  Weight: 130.2 kg 128.1 kg 126.5 kg    Examination:  General: Acute on chronic ill appearing adult male lying in bed on mechanical ventilation, in NAD HEENT: ETT, MM pink/moist, PERRL,  Neuro: Will open eyes to verbal stimuli but unable to follow commands  CV: s1s2 regular rate and rhythm, no murmur, rubs, or gallops,  PULM:  Clear to ascultation, no added breath sounds, no increased work of breathing on vent  GI: soft, bowel sounds active in all 4 quadrants, non-tender, non-distended Extremities: warm/dry, generalized edema  Skin: no rashes or lesions  Labs/imaging that I havepersonally reviewed    4/28 ECHO Left ventricular EF  60 to 65%. nromal LV and RV function. Perforated left coronary cusp with severe aortic regurgitation. Large  36 x 96m mobile vegetation on ventricular surface of left coronary cusp.  The aortic valve is abnormal. Aortic valve regurgitation is severe.   Resolved Hospital Problem list   Hyponatremia   Assessment & Plan:   Septic shock secondary to serratia bacteremia in the setting of IVDU  -Serratia on BCID P: Remains critical in ICU Supplemental oxygen via vent as below  Culture susceptibilities pending  Continue IV Cefepime  MAP goal > 65, continue Levo  Monitor urine output  Acute Respiratory insufficiency  -Intubated for TEE  P: Continue ventilator support with lung protective strategies  Attempt to wean vent settings as able, currently requiring FiO2 80 and PEEP 10 Wean  PEEP and FiO2 for sats greater than 90%. Head of bed elevated 30 degrees. Plateau pressures less than 30 cm H20.  Follow intermittent chest x-ray and ABG.   Ensure adequate pulmonary hygiene  Follow cultures  VAP bundle in place  PAD protocol  Endocarditis with Severe AI  -TEE 4/28 Perforated left coronary cusp with severe aortic regurgitation. Large 36 x 83m mobile vegetation on ventricular surface of left coronary cusp.  The aortic valve  is abnormal. Aortic valve regurgitation is severe P: Continue ABT as above  CTS consulted ID following Supportive care   Acute renal failure  -secondary to shock P: Nephrology following, appreciate assistance  CRRT started 4/28 Follow renal function / urine output Trend Bmet Avoid nephrotoxins Ensure adequate renal perfusion .   Acute metabolic encephalopathy:  -lethargy with intermittent agitation P: Maintain neuro protective measures; goal for eurothermia, euglycemia, eunatermia, normoxia, and PCO2 goal of 35-40 Nutrition and bowel regiment  Aspirations precautions  Supportive care Minimize sedation   Anemia Thrombocytopenia -most likely secondary to sepsis. No schistocytes seen on peripheral smear. LDH with very mild elevation and haptoglobin wnl as well as reticulocytes P: Transfuse per protocol  Hgb goal >7 Transfuse Plt less than 10  Polysubstance abuse -Suboxone, amphetamines P: Cessation education when appropriate   Best practice   Diet:  NPO Pain/Anxiety/Delirium protocol (if indicated): No VAP protocol (if indicated): Not indicated DVT prophylaxis: SCD GI prophylaxis: PPI Glucose control:  SSI No Central venous access:  N/A Arterial line:  N/A Foley:  Yes, and it is still needed Mobility:  bed rest  PT consulted: N/A Last date of multidisciplinary goals of care discussion [updated mother at bedside. ] Code Status:  full code Disposition: ICU   Critical care time:   Performed by: WJohnsie Cancel Total critical care time: 42 minutes  Critical care time was exclusive of separately billable procedures and treating other patients.  Critical care was necessary to treat or prevent imminent or life-threatening deterioration.  Critical care was time spent personally by me on the following activities: development of treatment plan with patient and/or surrogate as well as nursing, discussions with consultants, evaluation of patient's response to  treatment, examination of patient, obtaining history from patient or surrogate, ordering and performing treatments and interventions, ordering and review of laboratory studies, ordering and review of radiographic studies, pulse oximetry and re-evaluation of patient's condition.  WJohnsie Cancel NP-C [facility] Personal contact information can be found on Amion  If no response please page: Adult pulmonary and critical care medicine pager on Amion unitl 7pm After 7pm please call [phone]/29/2022, 9:04 AM

## 2020-07-21 NOTE — Procedures (Signed)
Cortrak  Person Inserting Tube:  Rashika Bettes, RD Tube Type:  Cortrak - 43 inches Tube Location:  Left nare Initial Placement:  Stomach Secured by: Bridle Technique Used to Measure Tube Placement:  Documented cm marking at nare/ corner of mouth Cortrak Secured At:  73 cm   No x-ray is required. RN may begin using tube.   If the tube becomes dislodged please keep the tube and contact the Cortrak team at [email or web address] ([email or web address]) for replacement.  If after hours and replacement cannot be delayed, place a NG tube and confirm placement with an abdominal x-ray.    Mariana Single RD, LDN Clinical Nutrition Pager listed in [place]

## 2020-07-21 NOTE — Progress Notes (Signed)
Nutrition Follow-up  DOCUMENTATION CODES:   Obesity unspecified  INTERVENTION:   Tube feeding via Cortrak: - Start Pivot 1.5 @ 20 ml/hr and advance by 10 ml q 8 hours to goal rate of 65 ml/hr (1560 ml/day) - ProSource TF 45 ml QID  Tube feeding regimen at goal rate provides 2500 kcal, 190 grams of protein, and 1184 ml of H2O.  - B-complex with vitamin C to account for losses with CRRT  NUTRITION DIAGNOSIS:   Inadequate oral intake related to inability to eat,lethargy/confusion as evidenced by NPO status.  Ongoing, being addressed via TF  GOAL:   Patient will meet greater than or equal to 90% of their needs  Progressing with initiation of TF  MONITOR:   Vent status,Labs,Weight trends,Skin,TF tolerance,I & O's  REASON FOR ASSESSMENT:   Consult Enteral/tube feeding initiation and management  ASSESSMENT:   40 year old male who presented to the ED on 4/26 with AMS. PMH of polysubstance abuse. Pt admitted with septic shock secondary to GNR bacteremia and acute renal failure.  4/28 - intubated, TEE, CRRT initiated 4/29 - Cortrak placed (gastric tip)  Discussed pt with RN and during ICU rounds. Pt with serratia bacteremia and AV endocarditis.  Consult received for tube feeding initiation and management. Cortrak tube placed this morning, tip gastric.  Will start tube feeds at trickle rate and slowly advance to goal. Discussed plan with RN.  Admit weight: 127.1 kg Current weight: 126.5 kg  Patient is currently intubated on ventilator support MV: 9.5 L/min Temp (24hrs), Avg:99.4 F (37.4 C), Min:96.8 F (36 C), Max:102.2 F (39 C) BP (a-line): 128/37 MAP (a-line): 63  Drips: Giapreza Fentanyl Versed Levophed Vasopressin  Medications reviewed and include: colace, IV protonix, miralax, IV abx  Labs reviewed: BUN 91, creatinine 5.80, magnesium 3.0, hemoglobin 9.4, platelets 14 CBG's: 83-97 x 24 hours  UOP: 90 ml x 24 hours CRRT UF: 792 ml x 12  hours I/O's: +7.9 L since admit  NUTRITION - FOCUSED PHYSICAL EXAM:  Flowsheet Row Most Recent Value  Orbital Region No depletion  Upper Arm Region No depletion  Thoracic and Lumbar Region No depletion  Buccal Region Unable to assess  Temple Region No depletion  Clavicle Bone Region No depletion  Clavicle and Acromion Bone Region No depletion  Scapular Bone Region Unable to assess  Dorsal Hand No depletion  Patellar Region No depletion  Anterior Thigh Region No depletion  Posterior Calf Region No depletion  Edema (RD Assessment) Moderate to severe  Hair Reviewed  Eyes Unable to assess  Mouth Unable to assess  Skin Reviewed  Nails Reviewed       Diet Order:   Diet Order            Diet NPO time specified  Diet effective now                 EDUCATION NEEDS:   Not appropriate for education at this time  Skin:  Skin Assessment: Skin Integrity Issues: Stage II: coccyx  Last BM:  no documented BM  Height:   Ht Readings from Last 1 Encounters:  07/18/20 '6\' 4"'$  (1.93 m)    Weight:   Wt Readings from Last 1 Encounters:  07/21/20 126.5 kg    Ideal Body Weight:  91.8 kg  BMI:  Body mass index is 33.95 kg/m.  Estimated Nutritional Needs:   Kcal:  2460  Protein:  180-200 grams  Fluid:  >/= 2.0 L    Gustavus Bryant, MS, RD, LDN Inpatient  Clinical Dietitian Please see AMiON for contact information.

## 2020-07-22 ENCOUNTER — Inpatient Hospital Stay (HOSPITAL_COMMUNITY): Payer: 59

## 2020-07-22 DIAGNOSIS — N179 Acute kidney failure, unspecified: Secondary | ICD-10-CM | POA: Diagnosis not present

## 2020-07-22 DIAGNOSIS — I358 Other nonrheumatic aortic valve disorders: Secondary | ICD-10-CM | POA: Diagnosis not present

## 2020-07-22 DIAGNOSIS — R6520 Severe sepsis without septic shock: Secondary | ICD-10-CM

## 2020-07-22 DIAGNOSIS — F1510 Other stimulant abuse, uncomplicated: Secondary | ICD-10-CM | POA: Diagnosis not present

## 2020-07-22 DIAGNOSIS — A419 Sepsis, unspecified organism: Secondary | ICD-10-CM | POA: Diagnosis not present

## 2020-07-22 LAB — RENAL FUNCTION PANEL
Albumin: 1.6 g/dL — ABNORMAL LOW (ref 3.5–5.0)
Albumin: 1.6 g/dL — ABNORMAL LOW (ref 3.5–5.0)
Anion gap: 10 (ref 5–15)
Anion gap: 9 (ref 5–15)
BUN: 56 mg/dL — ABNORMAL HIGH (ref 6–20)
BUN: 53 mg/dL — ABNORMAL HIGH (ref 6–20)
CO2: 24 mmol/L (ref 22–32)
CO2: 26 mmol/L (ref 22–32)
Calcium: 8.2 mg/dL — ABNORMAL LOW (ref 8.9–10.3)
Calcium: 8.4 mg/dL — ABNORMAL LOW (ref 8.9–10.3)
Chloride: 101 mmol/L (ref 98–111)
Chloride: 100 mmol/L (ref 98–111)
Creatinine, Ser: 3.6 mg/dL — ABNORMAL HIGH (ref 0.61–1.24)
Creatinine, Ser: 3.41 mg/dL — ABNORMAL HIGH (ref 0.61–1.24)
GFR, Estimated: 21 mL/min — ABNORMAL LOW (ref >60)
GFR, Estimated: 23 mL/min — ABNORMAL LOW (ref >60)
Glucose, Bld: 133 mg/dL — ABNORMAL HIGH (ref 70–99)
Glucose, Bld: 124 mg/dL — ABNORMAL HIGH (ref 70–99)
Phosphorus: 4.7 mg/dL — ABNORMAL HIGH (ref 2.5–4.6)
Phosphorus: 4.5 mg/dL (ref 2.5–4.6)
Potassium: 4.9 mmol/L (ref 3.5–5.1)
Potassium: 4.6 mmol/L (ref 3.5–5.1)
Sodium: 135 mmol/L (ref 135–145)
Sodium: 135 mmol/L (ref 135–145)

## 2020-07-22 LAB — GLUCOSE, CAPILLARY
Glucose-Capillary: 121 mg/dL — ABNORMAL HIGH (ref 70–99)
Glucose-Capillary: 120 mg/dL — ABNORMAL HIGH (ref 70–99)
Glucose-Capillary: 125 mg/dL — ABNORMAL HIGH (ref 70–99)
Glucose-Capillary: 123 mg/dL — ABNORMAL HIGH (ref 70–99)
Glucose-Capillary: 127 mg/dL — ABNORMAL HIGH (ref 70–99)
Glucose-Capillary: 104 mg/dL — ABNORMAL HIGH (ref 70–99)
Glucose-Capillary: 98 mg/dL (ref 70–99)

## 2020-07-22 LAB — CBC
HCT: 28.3 % — ABNORMAL LOW (ref 39.0–52.0)
Hemoglobin: 8.9 g/dL — ABNORMAL LOW (ref 13.0–17.0)
MCH: 27.2 pg (ref 26.0–34.0)
MCHC: 31.4 g/dL (ref 30.0–36.0)
MCV: 86.5 fL (ref 80.0–100.0)
Platelets: 21 10*3/uL — CL (ref 150–400)
RBC: 3.27 MIL/uL — ABNORMAL LOW (ref 4.22–5.81)
RDW: 16.9 % — ABNORMAL HIGH (ref 11.5–15.5)
WBC: 24.2 10*3/uL — ABNORMAL HIGH (ref 4.0–10.5)
nRBC: 0 % (ref 0.0–0.2)

## 2020-07-22 LAB — MAGNESIUM: Magnesium: 2.8 mg/dL — ABNORMAL HIGH (ref 1.7–2.4)

## 2020-07-22 LAB — HIV ANTIBODY (ROUTINE TESTING W REFLEX): HIV Screen 4th Generation wRfx: NONREACTIVE

## 2020-07-22 MED ORDER — MIDAZOLAM HCL 2 MG/2ML IJ SOLN
INTRAMUSCULAR | Status: AC
  Filled 2020-07-22: qty 2

## 2020-07-22 NOTE — Progress Notes (Signed)
Clarifeid with MD, Dr. Ander Slade and blood pressure goal is SBP >130  Dewaine Oats

## 2020-07-22 NOTE — Progress Notes (Signed)
Pt transported to and from MRI on the ventilator without incident. 

## 2020-07-22 NOTE — Progress Notes (Signed)
NAME:  Alex [name], MRN:  [id number], DOB:  [birth year], LOS: 4 ADMISSION DATE:  07/18/2020, CONSULTATION DATE:  4/26 REFERRING MD:  Dr. Florina Ou, CHIEF COMPLAINT:  AMS   History of Present Illness:  40 yo male smoker brought to [facility] ER with altered mental status.  Noted to have headache and feeling feverish before admission.  Found to have hypotension, AKI, thrombocytopenia, splenomegaly, lactic acidosis, elevated bilirubin, hypoglycemia.  Started on antibiotics and pressors for possible sepsis, but no clear source of infection.  Normal renal function from February 2022.  Uses suboxone for history of substance abuse.  UDS positive for amphetamines.  Transfer to [facility] for further therapy.  Hx from chart, medical team, and pt's mother.  Pertinent  Medical History  Polysubstance Abuse - amphetamines, heroin. On Suboxone.   Ruptured Disc  Significant Hospital Events: Including procedures, antibiotic start and stop dates in addition to other pertinent events   . 4/26 transfer from [facility] . CT head 4/26 >> no acute findings . CT abd/pelvis 4/26 >> cholelithiasis w/o inflammatory chagnes, mild hepatomegaly (25 cm), moderate splenomegaly (16.7 cm), 2 mm non obstructing kidney stone on Lt . 4/27 CVL placed. Pressors escalating.  . 4/28 intubated for TEE, CVC, HD, and A-line placed. CRRT started  . Remains on CRRT  Interim History / Subjective:  No acute issues overnight  Remains on CRRT Stable overnight  Objective   Blood pressure (!) 104/37, pulse 67, temperature (!) 96.8 F (36 C), temperature source Axillary, resp. rate 19, height '6\' 4"'$  (1.93 m), weight 128.2 kg, SpO2 100 %.    Vent Mode: PRVC FiO2 (%):  [40 %-60 %] 40 % Set Rate:  [15 bmp] 15 bmp Vt Set:  [600 mL] 600 mL PEEP:  [5 cmH20-10 cmH20] 5 cmH20 Plateau Pressure:  [16 cmH20-21 cmH20] 21 cmH20   Intake/Output Summary (Last 24 hours) at 07/22/2020 1141 Last data filed at 07/22/2020 1100 Gross per 24 hour  Intake 2715.92  ml  Output 3910 ml  Net -1194.08 ml   Filed Weights   07/20/20 0130 07/21/20 0431 07/22/20 0500  Weight: 128.1 kg 126.5 kg 128.2 kg    Examination:  General: Ill-appearing, does not appear to be in distress HEENT: Endotracheal tube in place, moist oral mucosa Neuro: Not following commands but responsive  CV: S1-S2 appreciated PULM: Clear breath sounds bilaterally GI: Soft, bowel sounds appreciated Extremities: warm/dry, generalized edema  Skin: no rashes or lesions  Labs/imaging that I havepersonally reviewed    4/28 ECHO Left ventricular EF  60 to 65%. nromal LV and RV function. Perforated left coronary cusp with severe aortic regurgitation. Large  36 x 78m mobile vegetation on ventricular surface of left coronary cusp.  The aortic valve is abnormal. Aortic valve regurgitation is severe.   Resolved Hospital Problem list   Hyponatremia   Assessment & Plan:   Septic shock secondary to Serratia bacteremia History of IVDU -Continue full vent support -Continue pressors -On cefepime -MAP goal greater than 65 on Levophed and vasopressin   Acute respiratory failure -Intubated for TEE -Continue full ventilator support -Weaning as tolerated -Repeat x-ray today  Endocarditis with severe AI -Infectious disease assisting with care appreciate consult -Cardiothoracic surgery consulted-no plans for surgical intervention at present  Acute renal failure -Appreciate renal input -On CRRT from [id number] Metabolic encephalopathy -Weaning sedation  Anemia Thrombocytopenia -Transfuse per protocol  Polysubstance abuse -Suboxone, amphetamines P: Cessation education when appropriate   Best practice   Diet:  NPO Pain/Anxiety/Delirium  protocol (if indicated): No VAP protocol (if indicated): Not indicated DVT prophylaxis: SCD GI prophylaxis: PPI Glucose control:  SSI No Central venous access:  N/A Arterial line:  N/A Foley:  Yes, and it is still needed Mobility:  bed rest  PT  consulted: N/A Last date of multidisciplinary goals of care discussion [updated mother at bedside 4/30. ] Code Status:  full code Disposition: ICU   Critical care time:   The patient is critically ill with multiple organ systems failure and requires high complexity decision making for assessment and support, frequent evaluation and titration of therapies, application of advanced monitoring technologies and extensive interpretation of multiple databases. Critical Care Time devoted to patient care services described in this note independent of APP/resident time (if applicable)  is 35 minutes.   Sherrilyn Rist MD [facility] Personal pager: [phone] If unanswered, please page CCM On-call: [phone]

## 2020-07-22 NOTE — Progress Notes (Signed)
Alex [name]  Assessment/ Plan: Pt is a 40 y.o. yo male with history of substance abuse, ruptured disc who was initially presented to ER with altered mental status found to be septic shock and Serratia bacteremia/AV endocarditis seen as a consultation for AKI requiring CRRT.  #Anuric acute kidney injury likely ischemic ATN in the setting of septic shock/septic emboli.  Kidney ultrasound without hydronephrosis. Started CRRT on 4/28.   Post filter potassium changed to 2K overnight with acceptable potassium level.  We will change UF goal to 50-100 cc an hour negative, discussed with ICU nurse.  Daily lab, strict ins and out.  #Serratia more concerns bacteremia/AV endocarditis: With history of substance abuse.  Seen by ID, on IV cefepime.  #Septic shock, multiorgan failure: Currently on vasopressin, Levophed and angiotensin IV.  Monitor blood pressure.  Antibiotics as above.  #Vent dependent respiratory failure managed by PCCM.  #Acute encephalopathy due to sepsis: Monitor mental status.  #Thrombocytopenia and anemia probably because of sepsis and acute illness.  Monitor lab.  Subjective: Seen and examined in ICU.  No urine output.  Remains on vasopressin and Levophed.  Also on sedation.  No issue with CRRT. Objective Vital signs in last 24 hours: Vitals:   07/22/20 0733 07/22/20 0753 07/22/20 0800 07/22/20 0900  BP:   (!) 105/37 (!) 110/38  Pulse:   64   Resp:  18 15 18   Temp: (!) 96.6 F (35.9 C)     TempSrc: Axillary     SpO2:  99% 100%   Weight:      Height:       Weight change: 1.7 kg  Intake/Output Summary (Last 24 hours) at 07/22/2020 0935 Last data filed at 07/22/2020 6010 Gross per 24 hour  Intake 2749.7 ml  Output 3769 ml  Net -1019.3 ml       Labs: Basic Metabolic Panel: Recent Labs  Lab 07/21/20 0325 07/21/20 1600 07/21/20 1959 07/22/20 0407  NA 136 134* 136 135  K 4.7 5.0 5.0 4.9  CL 101 101  --  101  CO2 23 23   --  24  GLUCOSE 99 114*  --  133*  BUN 91* 67*  --  56*  CREATININE 5.80*  5.92* 4.38*  --  3.60*  CALCIUM 8.5* 8.2*  --  8.2*  PHOS 4.4 4.6  --  4.7*   Liver Function Tests: Recent Labs  Lab 07/18/20 1351 07/19/20 0140 07/20/20 0246 07/20/20 2032 07/21/20 0325 07/21/20 1600 07/22/20 0407  AST 58* 51* 44*  --   --   --   --   ALT 25 23 21   --   --   --   --   ALKPHOS 569* 326* 179*  --   --   --   --   BILITOT 6.0* 7.0* 8.1*  --   --   --   --   PROT 6.0* 5.8* 6.0*  --   --   --   --   ALBUMIN 1.9* 1.6* 1.6*   < > 1.6* 1.6* 1.6*   < > = values in this interval not displayed.   No results for input(s): LIPASE, AMYLASE in the last 168 hours. Recent Labs  Lab 07/18/20 0317  AMMONIA 22   CBC: Recent Labs  Lab 07/18/20 0200 07/18/20 1135 07/19/20 0140 07/20/20 0246 07/21/20 0702 07/21/20 1600 07/21/20 1959 07/22/20 0407  WBC 10.9*   < > 13.1* 13.0* 15.4* 20.6*  --  24.2*  NEUTROABS  9.4*  --   --   --  11.2*  --   --   --   HGB 13.1   < > 11.5* 10.7* 9.4* 9.5* 9.2* 8.9*  HCT 40.8   < > 35.5* 33.7* 30.2* 29.9* 27.0* 28.3*  MCV 86.6   < > 85.1 86.2 87.0 86.2  --  86.5  PLT 49*   < > 32* 22* 14* 19*  --  21*   < > = values in this interval not displayed.   Cardiac Enzymes: Recent Labs  Lab 07/18/20 0330  CKTOTAL 79   CBG: Recent Labs  Lab 07/21/20 1517 07/21/20 2054 07/22/20 0103 07/22/20 0423 07/22/20 0725  GLUCAP 104* 112* 121* 120* 125*    Iron Studies:  No results for input(s): IRON, TIBC, TRANSFERRIN, FERRITIN in the last 72 hours. Studies/Results: DG CHEST PORT 1 VIEW  Result Date: 07/20/2020 CLINICAL DATA:  Central line placement. EXAM: PORTABLE CHEST 1 VIEW COMPARISON:  Chest x-ray from same day at 12:09 p.m. FINDINGS: New left internal jugular dialysis catheter with tip in the proximal SVC. Unchanged right subclavian central venous catheter. Endotracheal tube remains in good position with the tip 3.7 cm above the carina. Normal heart size.  Patchy airspace disease throughout both lungs, worse on the right, not significantly changed. No pleural effusion or pneumothorax. No acute osseous abnormality. IMPRESSION: 1. New left internal jugular dialysis catheter with tip in the proximal SVC. No pneumothorax. 2. Unchanged multifocal airspace disease. Electronically Signed   By: Titus Dubin M.D.   On: 07/20/2020 18:29   DG CHEST PORT 1 VIEW  Result Date: 07/20/2020 CLINICAL DATA:  Status post intubation. EXAM: PORTABLE CHEST 1 VIEW COMPARISON:  Single-view of the chest 07/19/2020. FINDINGS: Endotracheal tube is in place with the tip in good position at the level the clavicular heads. Right subclavian catheter is unchanged. New patchy bilateral airspace disease is much worse on the right. Heart size is enlarged. No pneumothorax. IMPRESSION: ETT in good position. Right much worse than left patchy airspace disease worrisome for pneumonia. Electronically Signed   By: Inge Rise M.D.   On: 07/20/2020 12:48   ECHO TEE  Result Date: 07/20/2020    TRANSESOPHOGEAL ECHO REPORT   Patient Name:   Alex [name] Cabler Date of Exam: 07/20/2020 Medical Rec #:  [id number]         Height:       76.0 in Accession #:    [id number]        Weight:       282.4 lb Date of Birth:  [birth year]         BSA:          2.565 m Patient Age:    40 years          BP:           130/48 mmHg Patient Gender: M                 HR:           90 bpm. Exam Location:  Inpatient Portions of this table do not appear on this page. Procedure: Transesophageal Echo, 3D Echo, Cardiac Doppler and Color Doppler Indications:    Bacteremia 790.7 / R78.81  History:        Patient has prior history of Echocardiogram examinations, most                 recent 07/19/2020. Polysubstance abuse.  Sonographer:    Darlina Sicilian  RDCS Referring Phys: [id number] Alphonsa Overall MCDANIEL PROCEDURE: After discussion of the risks and benefits of a TEE, an informed consent was obtained from a family member. The transesophogeal  probe was passed without difficulty through the esophogus of the patient. Sedation performed by performing physician. Patients was under conscious sedation during this procedure. Image quality was excellent. The patient's vital signs; including heart rate, blood pressure, and oxygen saturation; remained stable throughout the procedure. The patient developed no complications during the procedure. Intubated, ETT in place. IMPRESSIONS  1. Left ventricular ejection fraction, by estimation, is 60 to 65%. The left ventricle has normal function. The left ventricle has no regional wall motion abnormalities.  2. Right ventricular systolic function is normal. The right ventricular size is normal.  3. No left atrial/left atrial appendage thrombus was detected.  4. The mitral valve is normal in structure. No evidence of mitral valve regurgitation.  5. Perforated left coronary cusp with severe aortic regurgitation. Large 36 x 40m mobile vegetation on ventricular surface of left coronary cusp. The aortic valve is abnormal. Aortic valve regurgitation is severe.  6. Intubated, ETT in place. 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 FINDINGS  Left Ventricle: Left ventricular ejection fraction, by estimation, is 60 to 65%. The left ventricle has normal function. The left ventricle has no regional wall motion abnormalities. The left ventricular internal cavity size was normal in size. Right Ventricle: The right ventricular size is normal. No increase in right ventricular wall thickness. Right ventricular systolic function is normal. Left Atrium: Left atrial size was normal in size. No left atrial/left atrial appendage thrombus was detected. Right Atrium: Right atrial size was normal in size. Pericardium: There is no evidence of pericardial effusion. Mitral Valve: The mitral valve is normal in structure. No evidence of mitral valve regurgitation. Tricuspid Valve: The tricuspid valve  is normal in structure. Tricuspid valve regurgitation is not demonstrated. Aortic Valve: Perforated left coronary cusp with severe aortic regurgitation. Large 36 x 176mmobile vegetation on ventricular surface of left coronary cusp. The aortic valve is abnormal. Aortic valve regurgitation is severe. Aortic valve mean gradient measures 2.0 mmHg. Aortic valve peak gradient measures 3.2 mmHg. Aortic valve area, by VTI measures 9.21 cm. A mobile vegetation is seen. The AoV vegetation measures 30 mm x 15 mm. Pulmonic Valve: The pulmonic valve was normal in structure. Pulmonic valve regurgitation is not visualized. Aorta: The aortic root was not well visualized. Venous: The inferior vena cava was not well visualized. IAS/Shunts: No atrial level shunt detected by color flow Doppler.  LEFT VENTRICLE PLAX 2D LVOT diam:     2.70 cm LV SV:         148 LV SV Index:   58 LVOT Area:     5.73 cm  AORTIC VALVE AV Area (Vmax):    11.08 cm AV Area (Vmean):   8.97 cm AV Area (VTI):     9.21 cm AV Vmax:           89.40 cm/s AV Vmean:          68.300 cm/s AV VTI:            0.161 m AV Peak Grad:      3.2 mmHg AV Mean Grad:      2.0 mmHg LVOT Vmax:         173.00 cm/s LVOT Vmean:        107.000 cm/s LVOT VTI:          0.259 m  LVOT/AV VTI ratio: 1.61  SHUNTS Systemic VTI:  0.26 m Systemic Diam: 2.70 cm Candee Furbish MD Electronically signed by Candee Furbish MD Signature Date/Time: 07/20/2020/1:43:49 PM    Final     Medications: Infusions: .  prismasol BGK 4/2.5 400 mL/hr at 07/22/20 3016  . sodium chloride 10 mL/hr at 07/22/20 0900  . sodium chloride 10 mL/hr at 07/18/20 2300  . sodium chloride 20 mL/hr at 07/19/20 2000  . amiodarone    . ceFEPime (MAXIPIME) IV Stopped (07/22/20 0650)  . feeding supplement (PIVOT 1.5 CAL) 40 mL/hr at 07/22/20 0700  . fentaNYL infusion INTRAVENOUS 200 mcg/hr (07/22/20 0900)  . midazolam 3 mg/hr (07/22/20 0900)  . norepinephrine (LEVOPHED) Adult infusion 6 mcg/min (07/22/20 0900)  . prismasol  BGK 2/2.5 replacement solution 300 mL/hr at 07/21/20 1944  . prismasol BGK 4/2.5 1,800 mL/hr at 07/22/20 0109  . vasopressin 0.04 Units/min (07/22/20 0900)    Scheduled Medications: . B-complex with vitamin C  1 tablet Per Tube Daily  . chlorhexidine gluconate (MEDLINE KIT)  15 mL Mouth Rinse BID  . Chlorhexidine Gluconate Cloth  6 each Topical Daily  . docusate  100 mg Per Tube BID  . feeding supplement (PROSource TF)  45 mL Per Tube QID  . fentaNYL (SUBLIMAZE) injection  100 mcg Intravenous Once  . mouth rinse  15 mL Mouth Rinse 10 times per day  . pantoprazole (PROTONIX) IV  40 mg Intravenous Q24H  . polyethylene glycol  17 g Per Tube Daily  . sodium chloride flush  10-40 mL Intracatheter Q12H    have reviewed scheduled and prn medications.  Physical Exam: General: Critically ill looking male intubated, sedated, Heart:RRR, s1s2 nl Lungs: Coarse breath sound bilateral Abdomen:soft, Non-tender, non-distended Extremities:  LE edema+ Dialysis Access: IJ temporary HD catheter placed by ICU on 4/28  Kindred Reidinger Prasad Jonay Hitchcock 07/22/2020,9:35 AM  LOS: 4 days

## 2020-07-23 DIAGNOSIS — A021 Salmonella sepsis: Secondary | ICD-10-CM | POA: Diagnosis not present

## 2020-07-23 DIAGNOSIS — F1510 Other stimulant abuse, uncomplicated: Secondary | ICD-10-CM | POA: Diagnosis not present

## 2020-07-23 DIAGNOSIS — I358 Other nonrheumatic aortic valve disorders: Secondary | ICD-10-CM | POA: Diagnosis not present

## 2020-07-23 DIAGNOSIS — N179 Acute kidney failure, unspecified: Secondary | ICD-10-CM | POA: Diagnosis not present

## 2020-07-23 LAB — MAGNESIUM: Magnesium: 2.8 mg/dL — ABNORMAL HIGH (ref 1.7–2.4)

## 2020-07-23 LAB — RENAL FUNCTION PANEL
Albumin: 1.6 g/dL — ABNORMAL LOW (ref 3.5–5.0)
Albumin: 1.6 g/dL — ABNORMAL LOW (ref 3.5–5.0)
Anion gap: 10 (ref 5–15)
Anion gap: 7 (ref 5–15)
BUN: 54 mg/dL — ABNORMAL HIGH (ref 6–20)
BUN: 53 mg/dL — ABNORMAL HIGH (ref 6–20)
CO2: 25 mmol/L (ref 22–32)
CO2: 24 mmol/L (ref 22–32)
Calcium: 8.2 mg/dL — ABNORMAL LOW (ref 8.9–10.3)
Calcium: 8.2 mg/dL — ABNORMAL LOW (ref 8.9–10.3)
Chloride: 99 mmol/L (ref 98–111)
Chloride: 102 mmol/L (ref 98–111)
Creatinine, Ser: 3.27 mg/dL — ABNORMAL HIGH (ref 0.61–1.24)
Creatinine, Ser: 3 mg/dL — ABNORMAL HIGH (ref 0.61–1.24)
GFR, Estimated: 24 mL/min — ABNORMAL LOW (ref >60)
GFR, Estimated: 27 mL/min — ABNORMAL LOW (ref >60)
Glucose, Bld: 131 mg/dL — ABNORMAL HIGH (ref 70–99)
Glucose, Bld: 121 mg/dL — ABNORMAL HIGH (ref 70–99)
Phosphorus: 3.4 mg/dL (ref 2.5–4.6)
Phosphorus: 3.9 mg/dL (ref 2.5–4.6)
Potassium: 4.4 mmol/L (ref 3.5–5.1)
Potassium: 4.4 mmol/L (ref 3.5–5.1)
Sodium: 134 mmol/L — ABNORMAL LOW (ref 135–145)
Sodium: 133 mmol/L — ABNORMAL LOW (ref 135–145)

## 2020-07-23 LAB — CBC
HCT: 23.8 % — ABNORMAL LOW (ref 39.0–52.0)
Hemoglobin: 7.5 g/dL — ABNORMAL LOW (ref 13.0–17.0)
MCH: 27.4 pg (ref 26.0–34.0)
MCHC: 31.5 g/dL (ref 30.0–36.0)
MCV: 86.9 fL (ref 80.0–100.0)
Platelets: 37 10*3/uL — ABNORMAL LOW (ref 150–400)
RBC: 2.74 MIL/uL — ABNORMAL LOW (ref 4.22–5.81)
RDW: 17.1 % — ABNORMAL HIGH (ref 11.5–15.5)
WBC: 17.3 10*3/uL — ABNORMAL HIGH (ref 4.0–10.5)
nRBC: 0.2 % (ref 0.0–0.2)

## 2020-07-23 LAB — GLUCOSE, CAPILLARY
Glucose-Capillary: 108 mg/dL — ABNORMAL HIGH (ref 70–99)
Glucose-Capillary: 113 mg/dL — ABNORMAL HIGH (ref 70–99)
Glucose-Capillary: 117 mg/dL — ABNORMAL HIGH (ref 70–99)
Glucose-Capillary: 117 mg/dL — ABNORMAL HIGH (ref 70–99)
Glucose-Capillary: 123 mg/dL — ABNORMAL HIGH (ref 70–99)
Glucose-Capillary: 129 mg/dL — ABNORMAL HIGH (ref 70–99)

## 2020-07-23 MED ORDER — MIDAZOLAM HCL 2 MG/2ML IJ SOLN
2.0000 mg | INTRAMUSCULAR | Status: DC | PRN
  Administered 2020-07-23 – 2020-07-26 (×6): 2 mg via INTRAVENOUS
  Filled 2020-07-23 (×10): qty 2

## 2020-07-23 NOTE — Progress Notes (Signed)
[facility] Progress Note Patient Name: Alex [name] DOB: 01-03-1981 MRN: [id number]   Date of Service  07/23/2020  HPI/Events of Note  Agitation - Currently on a Fentanyl IV infusion.  eICU Interventions  Plan: 1. Versed 2 mg IV Q 2 hours PRN agitation or sedation.     Intervention Category Major Interventions: Delirium, psychosis, severe agitation - evaluation and management  Chava Dulac Eugene 07/23/2020, 12:10 AM

## 2020-07-23 NOTE — Progress Notes (Addendum)
NAME:  Alex [name], MRN:  [id number], DOB:  07-Jan-1981, LOS: 5 ADMISSION DATE:  07/18/2020, CONSULTATION DATE:  4/26 REFERRING MD:  Dr. Florina Ou, CHIEF COMPLAINT:  AMS   History of Present Illness:  40 yo male smoker brought to [facility] ER with altered mental status.  Noted to have headache and feeling feverish before admission.  Found to have hypotension, AKI, thrombocytopenia, splenomegaly, lactic acidosis, elevated bilirubin, hypoglycemia.  Started on antibiotics and pressors for possible sepsis, but no clear source of infection.  Normal renal function from February 2022.  Uses suboxone for history of substance abuse.  UDS positive for amphetamines.  Transfer to [facility] for further therapy.  Hx from chart, medical team, and pt's mother.  Pertinent  Medical History  Polysubstance Abuse - amphetamines, heroin. On Suboxone.   Ruptured Disc  Significant Hospital Events: Including procedures, antibiotic start and stop dates in addition to other pertinent events   . 4/26 transfer from [facility] to [facility] . CT head 4/26 >> no acute findings . CT abd/pelvis 4/26 >> cholelithiasis w/o inflammatory chagnes, mild hepatomegaly (25 cm), moderate splenomegaly (16.7 cm), 2 mm non obstructing kidney stone on Lt . 4/27 CVL placed. Pressors escalating.  . 4/28 intubated for TEE, CVC, HD, and A-line placed. CRRT started  . Remains on CRRT . MRI on 4/30-multiple infarcts  Interim History / Subjective:  No acute issues overnight, mild agitation Remains on CRRT Stable overnight Able to come off pressors  Objective   Blood pressure (!) 81/26, pulse 81, temperature 97.9 F (36.6 C), temperature source Axillary, resp. rate 17, height '6\' 4"'$  (1.93 m), weight 129.4 kg, SpO2 100 %.    Vent Mode: PRVC FiO2 (%):  [40 %] 40 % Set Rate:  [15 bmp] 15 bmp Vt Set:  [600 mL] 600 mL PEEP:  [5 cmH20-8 cmH20] 8 cmH20 Plateau Pressure:  [18 cmH20-21 cmH20] 18 cmH20   Intake/Output Summary (Last 24 hours) at 07/23/2020 [id number] Last  data filed at 07/23/2020 0800 Gross per 24 hour  Intake 2576.44 ml  Output 3982 ml  Net -1405.56 ml   Filed Weights   07/21/20 0431 07/22/20 0500 07/23/20 0500  Weight: 126.5 kg 128.2 kg 129.4 kg    Examination:  General: Ill-appearing, does not appear to be in distress HEENT: Moist oral mucosa, endotracheal tube in place Neuro: Not following commands, sedate CV: S1-S2 appreciated PULM: Clear breath sounds bilaterally GI: Soft, bowel sounds appreciated Extremities: Skin is warm and dry, generalized edema Skin: no rashes or lesions  Labs/imaging that I havepersonally reviewed    4/28 ECHO Left ventricular EF  60 to 65%. nromal LV and RV function. Perforated left coronary cusp with severe aortic regurgitation. Large  36 x 36m mobile vegetation on ventricular surface of left coronary cusp.  The aortic valve is abnormal. Aortic valve regurgitation is severe.  MRI: IMPRESSION: Dozens of primarily punctate acute infarctions scattered throughout the cerebellum and both cerebral hemispheres consistent with micro embolic infarctions from the heart or ascending aorta. Several are associated with petechial blood products as outlined above. Septic emboli not excluded given the clinical history.  Resolved Hospital Problem list   Hyponatremia   Assessment & Plan:   .  Septic shock secondary to Serratia bacteremia .  History of IVDU .  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  Endocarditis with severe AI -Appreciate infectious disease input -No plans for surgical intervention by cardiothoracic surgery  .  Acute renal failure -Appreciate renal output -On CRRT from 4/28  .  Metabolic encephalopathy -Continue to monitor  .  Anemia .Marland Kitchen  Thrombocytopenia -Transfuse per  protocol  .  Polysubstance abuse -Suboxone, amphetamines -Cessation education when appropriate  With MRI results-prognosis is poor  .  Contacted cardiothoracic surgery-spoke with Gold, PA-will discuss with Dr. Kipp Brood.  Best practice   Diet:  Tube Feed  Pain/Anxiety/Delirium protocol (if indicated): No VAP protocol (if indicated): Yes DVT prophylaxis: SCD GI prophylaxis: PPI Glucose control:  SSI No Central venous access:  N/A Arterial line:  N/A Foley:  N/A Mobility:  bed rest  PT consulted: N/A Last date of multidisciplinary goals of care discussion [updated mother at bedside 4/30. ] Code Status:  full code Disposition: ICU   Critical care time:   The patient is critically ill with multiple organ systems failure and requires high complexity decision making for assessment and support, frequent evaluation and titration of therapies, application of advanced monitoring technologies and extensive interpretation of multiple databases. Critical Care Time devoted to patient care services described in this note independent of APP/resident time (if applicable)  is 38 minutes.   Sherrilyn Rist MD [place] [facility] Personal pager: [phone] If unanswered, please page CCM On-call: [phone]

## 2020-07-23 NOTE — Progress Notes (Signed)
[facility] NEPHROLOGY PROGRESS NOTE  Assessment/ Plan: Pt is a 39 y.o. yo male with history of substance abuse, ruptured disc who was initially presented to ER with altered mental status found to be septic shock and Serratia bacteremia/AV endocarditis seen as a consultation for AKI requiring CRRT.  #Anuric acute kidney injury likely ischemic ATN in the setting of septic shock/septic emboli.  Kidney ultrasound without hydronephrosis. Started CRRT on 4/28.   Potassium level acceptable and tolerating UF goal 50-100 cc/hour, plan to continue current CRRT prescription. discussed with ICU nurse.  Daily lab, strict ins and out.  #Serratia more concerns bacteremia/AV endocarditis: With history of substance abuse.  Seen by ID, on IV cefepime.  #Septic shock, multiorgan failure: Today he is off of pressors with maintaining blood pressure.   Antibiotics as above.  #Vent dependent respiratory failure managed by PCCM.  #Acute encephalopathy due to sepsis/septic emboli: Monitor mental status.  #Thrombocytopenia and anemia probably because of sepsis and acute illness.  Monitor lab.  Subjective: Seen and examined in ICU.  Remains anuric.  Not requiring pressors with acceptable blood pressure reading.  Tolerating CRRT well and UF around 50-100 cc/h net negative.  No other new event. Objective Vital signs in last 24 hours: Vitals:   07/23/20 0700 07/23/20 0718 07/23/20 0757 07/23/20 0800  BP:      Pulse: 83   81  Resp: (!) 22 15  17  Temp:   97.9 F (36.6 C)   TempSrc:   Axillary   SpO2: 100% 100%  100%  Weight:      Height:       Weight change: 1.2 kg  Intake/Output Summary (Last 24 hours) at 07/23/2020 0914 Last data filed at 07/23/2020 0800 Gross per 24 hour  Intake 2576.44 ml  Output 3982 ml  Net -1405.56 ml       Labs: Basic Metabolic Panel: Recent Labs  Lab 07/22/20 0407 07/22/20 1510 07/23/20 0346  NA 135 135 134*  K 4.9 4.6 4.4  CL 101 100 99  CO2 24 26 25   GLUCOSE 133* 124* 131*  BUN 56* 53* 54*  CREATININE 3.60* 3.41* 3.27*  CALCIUM 8.2* 8.4* 8.2*  PHOS 4.7* 4.5 3.4   Liver Function Tests: Recent Labs  Lab 07/18/20 1351 07/19/20 0140 07/20/20 0246 07/20/20 2032 07/22/20 0407 07/22/20 1510 07/23/20 0346  AST 58* 51* 44*  --   --   --   --   ALT 25 23 21  --   --   --   --   ALKPHOS 569* 326* 179*  --   --   --   --   BILITOT 6.0* 7.0* 8.1*  --   --   --   --   PROT 6.0* 5.8* 6.0*  --   --   --   --   ALBUMIN 1.9* 1.6* 1.6*   < > 1.6* 1.6* 1.6*   < > = values in this interval not displayed.   No results for input(s): LIPASE, AMYLASE in the last 168 hours. Recent Labs  Lab 07/18/20 0317  AMMONIA 22   CBC: Recent Labs  Lab 07/18/20 0200 07/18/20 1135 07/20/20 0246 07/21/20 0702 07/21/20 1600 07/21/20 1959 07/22/20 0407 07/23/20 0346  WBC 10.9*   < > 13.0* 15.4* 20.6*  --  24.2* 17.3*  NEUTROABS 9.4*  --   --  11.2*  --   --   --   --   HGB 13.1   < >   10.7* 9.4* 9.5* 9.2* 8.9* 7.5*  HCT 40.8   < > 33.7* 30.2* 29.9* 27.0* 28.3* 23.8*  MCV 86.6   < > 86.2 87.0 86.2  --  86.5 86.9  PLT 49*   < > 22* 14* 19*  --  21* 37*   < > = values in this interval not displayed.   Cardiac Enzymes: Recent Labs  Lab 07/18/20 0330  CKTOTAL 79   CBG: Recent Labs  Lab 07/22/20 1508 07/22/20 1925 07/22/20 2314 07/23/20 0322 07/23/20 0755  GLUCAP 127* 104* 98 129* 113*    Iron Studies:  No results for input(s): IRON, TIBC, TRANSFERRIN, FERRITIN in the last 72 hours. Studies/Results: MR BRAIN WO CONTRAST  Result Date: 07/22/2020 CLINICAL DATA:  Headache, fever and altered mental status. Possible sepsis. Polysubstance abuse. EXAM: MRI HEAD WITHOUT CONTRAST TECHNIQUE: Multiplanar, multiecho pulse sequences of the brain and surrounding structures were obtained without intravenous contrast. COMPARISON:  Head CT 07/18/2020 FINDINGS: Brain: Diffusion imaging shows dozens of scattered acute infarctions throughout all vascular  territories consistent with micro embolic infarctions from the heart or ascending aorta. Most of these are 3-4 mm in size or less. The largest infarction is in the left parieto-occipital junction region measuring 1.7 cm. There is involvement of the central pons, both cerebellar hemispheres, both cerebral hemispheres. A small amount of hemorrhage is associated with lesions in the cerebellar vermis, the left parietooccipital junction, the right posterior frontal brain in the right parietal brain. Punctate blood products evident with some of the other small infarctions. No evidence of vasogenic edema. No mass effect or shift. Given the history of polysubstance abuse, septic emboli are not excluded. No hydrocephalus or extra-axial collection. No evidence of pre-existing brain insult. Vascular: Major vessels at the base of the brain show flow. Skull and upper cervical spine: Negative Sinuses/Orbits: Clear/normal Other: Bilateral mastoid effusions. 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 Several are associated with petechial blood products as outlined above. Septic emboli not excluded given the clinical history. Electronically Signed   By: Nelson Chimes M.D.   On: 07/22/2020 17:27   DG CHEST PORT 1 VIEW  Result Date: 07/22/2020 CLINICAL DATA:  Respiratory failure EXAM: PORTABLE CHEST 1 VIEW COMPARISON:  July 20, 2020 FINDINGS: The ET tube and left central line are in good position. A right central line is in good position. The feeding tube terminates below today's film. Cardiomediastinal silhouette is stable. No pulmonary nodules or masses. No focal infiltrates. No overt edema. IMPRESSION: 1. Support apparatus as above. 2. No other acute abnormalities. Electronically Signed   By: Dorise Bullion III M.D   On: 07/22/2020 14:57    Medications: Infusions: .   prismasol BGK 4/2.5 400 mL/hr at 07/22/20 2250  . sodium chloride Stopped (07/22/20 1629)  . sodium chloride 10 mL/hr at 07/23/20 0800  . sodium chloride 20 mL/hr at 07/19/20 2000  . amiodarone    . ceFEPime (MAXIPIME) IV Stopped (07/23/20 0550)  . feeding supplement (PIVOT 1.5 CAL) 65 mL/hr at 07/23/20 0000  . fentaNYL infusion INTRAVENOUS 200 mcg/hr (07/23/20 0800)  . norepinephrine (LEVOPHED) Adult infusion Stopped (07/22/20 2108)  . prismasol BGK 2/2.5 replacement solution 300 mL/hr at 07/22/20 1241  . prismasol BGK 4/2.5 1,800 mL/hr at 07/23/20 0847  . vasopressin Stopped (07/22/20 2256)    Scheduled Medications: . B-complex with vitamin C  1 tablet Per Tube Daily  .  chlorhexidine gluconate (MEDLINE KIT)  15 mL Mouth Rinse BID  . Chlorhexidine Gluconate Cloth  6 each Topical Daily  . docusate  100 mg Per Tube BID  . feeding supplement (PROSource TF)  45 mL Per Tube QID  . fentaNYL (SUBLIMAZE) injection  100 mcg Intravenous Once  . mouth rinse  15 mL Mouth Rinse 10 times per day  . pantoprazole (PROTONIX) IV  40 mg Intravenous Q24H  . polyethylene glycol  17 g Per Tube Daily  . sodium chloride flush  10-40 mL Intracatheter Q12H    have reviewed scheduled and prn medications.  Physical Exam: General: Critically ill looking male intubated, sedated Heart:RRR, s1s2 nl Lungs: Coarse breath sound bilateral Abdomen:soft, Non-tender, non-distended Extremities:  LE edema+ Dialysis Access: IJ temporary HD catheter placed by ICU on 4/28  Dejohn Ibarra Prasad Wilber Fini 07/23/2020,9:14 AM  LOS: 5 days

## 2020-07-24 ENCOUNTER — Inpatient Hospital Stay (HOSPITAL_COMMUNITY): Payer: 59

## 2020-07-24 DIAGNOSIS — Z9911 Dependence on respirator [ventilator] status: Secondary | ICD-10-CM

## 2020-07-24 DIAGNOSIS — J9601 Acute respiratory failure with hypoxia: Secondary | ICD-10-CM

## 2020-07-24 DIAGNOSIS — N179 Acute kidney failure, unspecified: Secondary | ICD-10-CM | POA: Diagnosis not present

## 2020-07-24 DIAGNOSIS — I358 Other nonrheumatic aortic valve disorders: Secondary | ICD-10-CM | POA: Diagnosis not present

## 2020-07-24 DIAGNOSIS — I351 Nonrheumatic aortic (valve) insufficiency: Secondary | ICD-10-CM

## 2020-07-24 DIAGNOSIS — I6340 Cerebral infarction due to embolism of unspecified cerebral artery: Secondary | ICD-10-CM | POA: Insufficient documentation

## 2020-07-24 LAB — GLUCOSE, CAPILLARY
Glucose-Capillary: 122 mg/dL — ABNORMAL HIGH (ref 70–99)
Glucose-Capillary: 125 mg/dL — ABNORMAL HIGH (ref 70–99)
Glucose-Capillary: 128 mg/dL — ABNORMAL HIGH (ref 70–99)
Glucose-Capillary: 132 mg/dL — ABNORMAL HIGH (ref 70–99)
Glucose-Capillary: 135 mg/dL — ABNORMAL HIGH (ref 70–99)
Glucose-Capillary: 85 mg/dL (ref 70–99)

## 2020-07-24 LAB — CBC
HCT: 27.6 % — ABNORMAL LOW (ref 39.0–52.0)
Hemoglobin: 8.5 g/dL — ABNORMAL LOW (ref 13.0–17.0)
MCH: 27.1 pg (ref 26.0–34.0)
MCHC: 30.8 g/dL (ref 30.0–36.0)
MCV: 87.9 fL (ref 80.0–100.0)
Platelets: 63 10*3/uL — ABNORMAL LOW (ref 150–400)
RBC: 3.14 MIL/uL — ABNORMAL LOW (ref 4.22–5.81)
RDW: 17.2 % — ABNORMAL HIGH (ref 11.5–15.5)
WBC: 18.2 10*3/uL — ABNORMAL HIGH (ref 4.0–10.5)
nRBC: 0.2 % (ref 0.0–0.2)

## 2020-07-24 LAB — RENAL FUNCTION PANEL
Albumin: 1.6 g/dL — ABNORMAL LOW (ref 3.5–5.0)
Albumin: 1.7 g/dL — ABNORMAL LOW (ref 3.5–5.0)
Anion gap: 8 (ref 5–15)
Anion gap: 8 (ref 5–15)
BUN: 57 mg/dL — ABNORMAL HIGH (ref 6–20)
BUN: 62 mg/dL — ABNORMAL HIGH (ref 6–20)
CO2: 24 mmol/L (ref 22–32)
CO2: 24 mmol/L (ref 22–32)
Calcium: 8.2 mg/dL — ABNORMAL LOW (ref 8.9–10.3)
Calcium: 7.9 mg/dL — ABNORMAL LOW (ref 8.9–10.3)
Chloride: 101 mmol/L (ref 98–111)
Chloride: 101 mmol/L (ref 98–111)
Creatinine, Ser: 2.86 mg/dL — ABNORMAL HIGH (ref 0.61–1.24)
Creatinine, Ser: 2.73 mg/dL — ABNORMAL HIGH (ref 0.61–1.24)
GFR, Estimated: 28 mL/min — ABNORMAL LOW (ref >60)
GFR, Estimated: 30 mL/min — ABNORMAL LOW (ref >60)
Glucose, Bld: 140 mg/dL — ABNORMAL HIGH (ref 70–99)
Glucose, Bld: 131 mg/dL — ABNORMAL HIGH (ref 70–99)
Phosphorus: 3.7 mg/dL (ref 2.5–4.6)
Phosphorus: 4.2 mg/dL (ref 2.5–4.6)
Potassium: 4.7 mmol/L (ref 3.5–5.1)
Potassium: 5.1 mmol/L (ref 3.5–5.1)
Sodium: 133 mmol/L — ABNORMAL LOW (ref 135–145)
Sodium: 133 mmol/L — ABNORMAL LOW (ref 135–145)

## 2020-07-24 LAB — MAGNESIUM: Magnesium: 2.5 mg/dL — ABNORMAL HIGH (ref 1.7–2.4)

## 2020-07-24 MED ORDER — PANTOPRAZOLE SODIUM 40 MG PO PACK
40.0000 mg | PACK | Freq: Every day | ORAL | Status: DC
  Administered 2020-07-25 – 2020-07-26 (×2): 40 mg
  Filled 2020-07-24: qty 20

## 2020-07-24 NOTE — Consult Note (Signed)
Stroke Neurology Consultation Note  Consult Requested by: Dr. Carlis Abbott  Reason for Consult: stroke on MRI  Consult Date: 07/24/20   The history was obtained from the mom and RN and chart.  During history and examination, all items were able to obtain unless otherwise noted.  History of Present Illness:  Alex [name] is a 40 y.o. Caucasian male with PMH of opiate addiction on suboxone admitted for HA, fever and AMS. Found to have hypotension, sepsis, AKI, hypoglycemia. Blood culture showed serratia positive bacteremia. Repeat culture so far negative. ID on board , on cefepime. Pt has AKI and likely due to ATN, nephrology on board and currently on CRRT. 2D echo EF 60-65% and severe AI and AR and concerning for AV vegetation. TEE confirmed large AV vegetation and severe AR. Cardiovascular surgery consult is pending. MRI brain showed Dozens of primarily punctate acute infarctions scattered throughout the cerebellum and both cerebral hemispheres consistent with microembolic infarctions, consistent with endocarditis. Pt currently still intubated and on weaning, afebrile. Neuro consulted for strokes on MRI.   Mom at beside stated that pt on suboxone but he would crash the meds and injected into the vein. Also pt and her girlfriend was using IV meth days before the admission.    Past Medical History:  Diagnosis Date  . Ruptured lumbar disc     History reviewed. No pertinent surgical history.  History reviewed. No pertinent family history.  Social History:  reports that he has been smoking cigarettes. He has been smoking about 1.00 pack per day. He has never used smokeless tobacco. He reports current drug use. Drug: Marijuana. He reports that he does not drink alcohol.  Allergies: No Known Allergies  No current facility-administered medications on file prior to encounter.   Current Outpatient Medications on File Prior to Encounter  Medication Sig Dispense Refill  . buprenorphine-naloxone  (SUBOXONE) 8-2 mg SUBL SL tablet Place 1 tablet under the tongue 2 (two) times daily. May place additional 1/2 tablet under the tongue daily as needed    . lisinopril-hydrochlorothiazide (ZESTORETIC) 10-12.5 MG tablet Take 1 tablet by mouth daily.    . naloxone (NARCAN) nasal spray 4 mg/0.1 mL Place 1 spray into the nose once. As needed for up to 1 dose.      Review of Systems: A full ROS was attempted today and was not able to be performed.   Physical Examination:  Temp:  [97.9 F (36.6 C)-98.6 F (37 C)] 98.2 F (36.8 C) (05/02 1101) Pulse Rate:  [82-95] 87 (05/02 1500) Resp:  [16-27] 18 (05/02 1500) BP: (101-119)/(35-48) 112/37 (05/02 1522) SpO2:  [99 %-100 %] 100 % (05/02 1522) Arterial Line BP: (128-337)/(36-326) 337/326 (05/02 0845) FiO2 (%):  [40 %] 40 % (05/02 1522) Weight:  [126.9 kg] 126.9 kg (05/02 0417)  General - Well nourished, well developed, intubated not on sedation.  Ophthalmologic - fundi not visualized due to noncooperation.  Cardiovascular - Regular rate and rhythm.  Neuro - intubated off sedation, eyes closed, sleepy but open on voice, however, needed repetitive stimulation to keep following commands. Able to follow most midline and peripheral commands but with perseveration on the left hand. With eye opening, eyes in mid position, inconsistently blinking to visual threat bilaterally, not tracking, PERRL. Corneal reflex present, gag and cough present. Breathing over the vent, on weaning trils.  Facial symmetry not able to test due to ET tube.  Tongue protrusion midline. BUE and BLE 3/5 symmetrically. DTR 1+ and no babinski. Sensation, coordination not  cooperative and gait not tested.    Data Reviewed: CT ABDOMEN PELVIS WO CONTRAST  Result Date: 07/18/2020 CLINICAL DATA:  Acute, nonlocalized abdominal pain, acute renal failure, leukocytosis EXAM: CT ABDOMEN AND PELVIS WITHOUT CONTRAST TECHNIQUE: Multidetector CT imaging of the abdomen and pelvis was performed  following the standard protocol without IV contrast. COMPARISON:  None. FINDINGS: Lower chest: Mild bibasilar atelectasis. The visualized heart and pericardium are unremarkable. Hepatobiliary: Cholelithiasis without pericholecystic inflammatory change. Mild hepatomegaly with the liver measuring 25 cm in craniocaudal dimension. Liver otherwise unremarkable. No intra or extrahepatic biliary ductal dilation. Pancreas: Unremarkable Spleen: Moderate splenomegaly with the spleen measuring up to 16.7 cm in greatest dimension. No definite intra splenic lesion identified on this noncontrast examination. Adrenals/Urinary Tract: The adrenal glands are unremarkable. The kidneys are normal in size and position. 2 mm nonobstructing calculus within the lower pole of the left kidney. The kidneys are otherwise unremarkable. Foley catheter balloon is seen within the decompressed bladder lumen. Stomach/Bowel: Stomach is within normal limits. Appendix appears normal. No evidence of bowel wall thickening, distention, or inflammatory changes. No free intraperitoneal gas or fluid. Vascular/Lymphatic: No significant vascular findings are present. No enlarged abdominal or pelvic lymph nodes. Reproductive: Prostate is unremarkable. Other: No abdominal wall hernia.  Rectum unremarkable. Musculoskeletal: No lytic or blastic bone lesion. No acute bone abnormality. IMPRESSION: Mild to moderate hepatosplenomegaly. Cholelithiasis without superimposed inflammatory change. Minimal left nonobstructing nephrolithiasis. Electronically Signed   By: Fidela Salisbury MD   On: 07/18/2020 05:53   DG Chest 1 View  Result Date: 07/18/2020 CLINICAL DATA:  40 year old male with central line placement. EXAM: CHEST  1 VIEW COMPARISON:  Earlier radiograph dated 07/18/2020. FINDINGS: Right subclavian central venous line with tip over the cavoatrial junction. No pneumothorax. There is mild cardiomegaly with mild vascular congestion. No focal consolidation, pleural  effusion, or pneumothorax. No acute osseous pathology. IMPRESSION: Interval placement of a right subclavian central venous line with tip over the cavoatrial junction. No pneumothorax. Electronically Signed   By: Anner Crete M.D.   On: 07/18/2020 23:35   CT Head Wo Contrast  Result Date: 07/18/2020 CLINICAL DATA:  Delirium, acute renal failure, leukocytosis EXAM: CT HEAD WITHOUT CONTRAST TECHNIQUE: Contiguous axial images were obtained from the base of the skull through the vertex without intravenous contrast. COMPARISON:  None. FINDINGS: Brain: Normal anatomic configuration. No abnormal intra or extra-axial mass lesion or fluid collection. No abnormal mass effect or midline shift. No evidence of acute intracranial hemorrhage or infarct. Ventricular size is normal. Cerebellum unremarkable. Vascular: No hyperdense vessel or unexpected calcification. Skull: Intact Sinuses/Orbits: Paranasal sinuses are clear. Orbits are unremarkable. Other: Mastoid air cells and middle ear cavities are clear. IMPRESSION: No acute intracranial abnormality. Electronically Signed   By: Fidela Salisbury MD   On: 07/18/2020 05:47   MR BRAIN WO CONTRAST  Result Date: 07/22/2020 CLINICAL DATA:  Headache, fever and altered mental status. Possible sepsis. Polysubstance abuse. EXAM: MRI HEAD WITHOUT CONTRAST TECHNIQUE: Multiplanar, multiecho pulse sequences of the brain and surrounding structures were obtained without intravenous contrast. 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 Most of these are 3-4 mm in size or less. The largest infarction is in the left parieto-occipital junction region measuring 1.7 cm. There is involvement of the central pons, both cerebellar hemispheres, both cerebral hemispheres. A small amount of hemorrhage is associated with  lesions in  the cerebellar vermis, the left parietooccipital junction, the right posterior frontal brain in the right parietal brain. Punctate blood products evident with some of the other small infarctions. No evidence of vasogenic edema. No mass effect or shift. Given the history of polysubstance abuse, septic emboli are not excluded. No hydrocephalus or extra-axial collection. No evidence of pre-existing brain insult. Vascular: Major vessels at the base of the brain show flow. Skull and upper cervical spine: Negative Sinuses/Orbits: Clear/normal Other: Bilateral mastoid effusions. 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 Several are associated with petechial blood products as outlined above. Septic emboli not excluded given the clinical history. Electronically Signed   By: Nelson Chimes M.D.   On: 07/22/2020 17:27   US RENAL  Result Date: 07/18/2020 CLINICAL DATA:  Acute kidney injury. EXAM: RENAL / URINARY TRACT ULTRASOUND COMPLETE COMPARISON:  Noncontrast CT earlier today. FINDINGS: Right Kidney: Renal measurements: 13.1 x 6.5 x 6.4 cm = volume: 281 mL. Echogenicity within normal limits. No hydronephrosis. No focal renal lesion or stone. Left Kidney: Renal measurements: 14.2 x 7.2 x 6.3 cm = volume: 338 mL. Echogenicity within normal limits. No hydronephrosis. No focal renal lesion. Nonobstructing intrarenal stone on CT earlier today is not visualized. Bladder: Decompressed by Foley catheter. Other: Splenomegaly as seen on CT earlier today. Cystic lesion at the hilum measures 3.3 cm. What is labeled by the technologist as incidental spleen represents bowel. Technically challenging exam due to habitus. IMPRESSION: 1. No obstructive uropathy. 2. Punctate nonobstructing left renal stone on CT earlier today is not seen by ultrasound. Electronically Signed    By: Keith Rake M.D.   On: 07/18/2020 15:04   DG CHEST PORT 1 VIEW  Result Date: 07/22/2020 CLINICAL DATA:  Respiratory failure EXAM: PORTABLE CHEST 1 VIEW COMPARISON:  July 20, 2020 FINDINGS: The ET tube and left central line are in good position. A right central line is in good position. The feeding tube terminates below today's film. Cardiomediastinal silhouette is stable. No pulmonary nodules or masses. No focal infiltrates. No overt edema. IMPRESSION: 1. Support apparatus as above. 2. No other acute abnormalities. Electronically Signed   By: Dorise Bullion III M.D   On: 07/22/2020 14:57   DG CHEST PORT 1 VIEW  Result Date: 07/20/2020 CLINICAL DATA:  Central line placement. EXAM: PORTABLE CHEST 1 VIEW COMPARISON:  Chest x-ray from same day at 12:09 p.m. FINDINGS: New left internal jugular dialysis catheter with tip in the proximal SVC. Unchanged right subclavian central venous catheter. Endotracheal tube remains in good position with the tip 3.7 cm above the carina. Normal heart size. Patchy airspace disease throughout both lungs, worse on the right, not significantly changed. No pleural effusion or pneumothorax. No acute osseous abnormality. IMPRESSION: 1. New left internal jugular dialysis catheter with tip in the proximal SVC. No pneumothorax. 2. Unchanged multifocal airspace disease. Electronically Signed   By: Titus Dubin M.D.   On: 07/20/2020 18:29   DG CHEST PORT 1 VIEW  Result Date: 07/20/2020 CLINICAL DATA:  Status post intubation. EXAM: PORTABLE CHEST 1 VIEW COMPARISON:  Single-view of the chest 07/19/2020. FINDINGS: Endotracheal tube is in place with the tip in good position at the level the clavicular heads. Right subclavian catheter is unchanged. New patchy bilateral airspace disease is much worse on the right. Heart size is enlarged. No pneumothorax. IMPRESSION: ETT in good position. Right much worse than left  patchy airspace disease worrisome for pneumonia. Electronically  Signed   By: Inge Rise M.D.   On: 07/20/2020 12:48   DG Chest Port 1 View  Result Date: 07/19/2020 CLINICAL DATA:  Hypoxia EXAM: PORTABLE CHEST 1 VIEW COMPARISON:  07/18/2020 FINDINGS: Right subclavian central venous catheter tip noted within the right atrium. Lung volumes are slightly small, but are stable, with mild left basilar atelectasis. No superimposed confluent pulmonary infiltrate. No pneumothorax or pleural effusion. Cardiac size is within normal limits. Pulmonary vascularity is normal. IMPRESSION: Stable examination.  Mild left basilar atelectasis. Electronically Signed   By: Fidela Salisbury MD   On: 07/19/2020 05:15   DG Chest Portable 1 View  Result Date: 07/18/2020 CLINICAL DATA:  Fever EXAM: PORTABLE CHEST 1 VIEW COMPARISON:  None. FINDINGS: Lung volumes are small, but are symmetric and are clear. No pneumothorax or pleural effusion. Cardiac size within normal limits. Pulmonary vascularity is normal. No acute bone abnormality. IMPRESSION: Pulmonary hypoinflation. Electronically Signed   By: Fidela Salisbury MD   On: 07/18/2020 04:09   ECHOCARDIOGRAM COMPLETE  Result Date: 07/19/2020    ECHOCARDIOGRAM REPORT   Patient Name:   Alex [name] Date of Exam: 07/19/2020 Medical Rec #:  [id number]         Height:       76.0 in Accession #:    [id number]        Weight:       287.0 lb Date of Birth:  08/11/1980         BSA:          2.583 m Patient Age:    39 years          BP:           115/48 mmHg Patient Gender: M                 HR:           85 bpm. Exam Location:  Inpatient Procedure: 2D Echo, Cardiac Doppler and Color Doppler  Results discussed with Dr [name] at 1158. Indications:     Shock  History:         Patient has no prior history of Echocardiogram examinations.                  Risk Factors:Current Smoker.  Sonographer:     Cammy Brochure Referring Phys:  [place] Diagnosing Phys: Oswaldo Milian MD IMPRESSIONS  1. Left ventricular ejection fraction, by  estimation, is 60 to 65%. The left ventricle has normal function. The left ventricle has no regional wall motion abnormalities. Left ventricular diastolic parameters were normal.  2. Right ventricular systolic function is normal. The right ventricular size is normal. Tricuspid regurgitation signal is inadequate for assessing PA pressure.  3. The mitral valve is normal in structure. No evidence of mitral valve regurgitation.  4. The aortic valve was not well visualized. Aortic valve regurgitation is severe. No aortic stenosis is present. Poor visualization of aortic valve but leaflets appears thickened, concerning for vegetation on AV causing eccentric AI that appears severe. Holodiastolic flow reveral is seen in thoracic aorta, consistent with severe AI. Recommend TEE for further evaluation. FINDINGS  Left Ventricle: Left ventricular ejection fraction, by estimation, is 60 to 65%. The left ventricle has normal function. The left ventricle has no regional wall motion abnormalities. The left ventricular internal cavity size was normal in size. There is  no left ventricular hypertrophy. Left ventricular diastolic parameters  were normal. Right Ventricle: The right ventricular size is normal. No increase in right ventricular wall thickness. Right ventricular systolic function is normal. Tricuspid regurgitation signal is inadequate for assessing PA pressure. Left Atrium: Left atrial size was normal in size. Right Atrium: Right atrial size was not well visualized. Pericardium: There is no evidence of pericardial effusion. Mitral Valve: The mitral valve is normal in structure. No evidence of mitral valve regurgitation. Tricuspid Valve: The tricuspid valve is normal in structure. Tricuspid valve regurgitation is trivial. Aortic Valve: The aortic valve was not well visualized. Aortic valve regurgitation is severe. No aortic stenosis is present. Aortic valve mean gradient measures 8.0 mmHg. Aortic valve peak gradient measures  13.8 mmHg. Aortic valve area, by VTI measures 3.72 cm. Pulmonic Valve: The pulmonic valve was not well visualized. Pulmonic valve regurgitation is not visualized. Aorta: The aortic root is normal in size and structure. IAS/Shunts: The interatrial septum was not well visualized.  LEFT VENTRICLE PLAX 2D LVOT diam:     2.40 cm  Diastology LV SV:         117      LV e' medial:    9.68 cm/s LV SV Index:   45       LV E/e' medial:  6.6 LVOT Area:     4.52 cm LV e' lateral:   11.50 cm/s                         LV E/e' lateral: 5.6  RIGHT VENTRICLE             IVC RV Basal diam:  3.90 cm     IVC diam: 1.50 cm RV S prime:     15.90 cm/s TAPSE (M-mode): 2.5 cm LEFT ATRIUM             Index       RIGHT ATRIUM          Index LA diam:        3.80 cm 1.47 cm/m  RA Area:     9.47 cm LA Vol (A2C):   40.4 ml 15.64 ml/m RA Volume:   18.60 ml 7.20 ml/m LA Vol (A4C):   50.1 ml 19.40 ml/m LA Biplane Vol: 49.3 ml 19.09 ml/m  AORTIC VALVE AV Area (Vmax):    4.21 cm AV Area (Vmean):   3.53 cm AV Area (VTI):     3.72 cm AV Vmax:           186.00 cm/s AV Vmean:          137.000 cm/s AV VTI:            0.315 m AV Peak Grad:      13.8 mmHg AV Mean Grad:      8.0 mmHg LVOT Vmax:         173.00 cm/s LVOT Vmean:        107.000 cm/s LVOT VTI:          0.259 m LVOT/AV VTI ratio: 0.82  AORTA Ao Root diam: 3.40 cm MITRAL VALVE MV Area (PHT): 5.13 cm    SHUNTS MV Decel Time: 148 msec    Systemic VTI:  0.26 m MV E velocity: 64.00 cm/s  Systemic Diam: 2.40 cm MV A velocity: 47.50 cm/s MV E/A ratio:  1.35 Oswaldo Milian MD Electronically signed by Oswaldo Milian MD Signature Date/Time: 07/19/2020/11:54:58 AM    Final (Updated)    ECHO TEE  Result Date: 07/20/2020  TRANSESOPHOGEAL ECHO REPORT   Patient Name:   Alex [name] Date of Exam: 07/20/2020 Medical Rec #:  [id number]         Height:       76.0 in Accession #:    [id number]        Weight:       282.4 lb Date of Birth:  [birth year]         BSA:          2.565 m Patient  Age:    39 years          BP:           130/48 mmHg Patient Gender: M                 HR:           90 bpm. Exam Location:  Inpatient Portions of this table do not appear on this page. Procedure: Transesophageal Echo, 3D Echo, Cardiac Doppler and Color Doppler Indications:    Bacteremia 790.7 / R78.81  History:        Patient has prior history of Echocardiogram examinations, most                 recent 07/19/2020. Polysubstance abuse.  Sonographer:    Darlina Sicilian RDCS Referring Phys: [phone] JILL D MCDANIEL PROCEDURE: After discussion of the risks and benefits of a TEE, an informed consent was obtained from a family member. The transesophogeal probe was passed without difficulty through the esophogus of the patient. Sedation performed by performing physician. Patients was under conscious sedation during this procedure. Image quality was excellent. The patient's vital signs; including heart rate, blood pressure, and oxygen saturation; remained stable throughout the procedure. The patient developed no complications during the procedure. Intubated, ETT in place. IMPRESSIONS  1. Left ventricular ejection fraction, by estimation, is 60 to 65%. The left ventricle has normal function. The left ventricle has no regional wall motion abnormalities.  2. Right ventricular systolic function is normal. The right ventricular size is normal.  3. No left atrial/left atrial appendage thrombus was detected.  4. The mitral valve is normal in structure. No evidence of mitral valve regurgitation.  5. Perforated left coronary cusp with severe aortic regurgitation. Large 36 x 76m mobile vegetation on ventricular surface of left coronary cusp. The aortic valve is abnormal. Aortic valve regurgitation is severe.  6. Intubated, ETT in place. 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 FINDINGS  Left Ventricle: Left ventricular ejection fraction, by estimation, is 60 to 65%. The left ventricle  has normal function. The left ventricle has no regional wall motion abnormalities. The left ventricular internal cavity size was normal in size. Right Ventricle: The right ventricular size is normal. No increase in right ventricular wall thickness. Right ventricular systolic function is normal. Left Atrium: Left atrial size was normal in size. No left atrial/left atrial appendage thrombus was detected. Right Atrium: Right atrial size was normal in size. Pericardium: There is no evidence of pericardial effusion. Mitral Valve: The mitral valve is normal in structure. No evidence of mitral valve regurgitation. Tricuspid Valve: The tricuspid valve is normal in structure. Tricuspid valve regurgitation is not demonstrated. Aortic Valve: Perforated left coronary cusp with severe aortic regurgitation. Large 36 x 161mmobile vegetation on ventricular surface of left coronary cusp. The aortic valve is abnormal. Aortic valve regurgitation is severe. Aortic valve mean gradient measures 2.0 mmHg. Aortic valve peak gradient measures 3.2 mmHg. Aortic valve area, by VTI  measures 9.21 cm. A mobile vegetation is seen. The AoV vegetation measures 30 mm x 15 mm. Pulmonic Valve: The pulmonic valve was normal in structure. Pulmonic valve regurgitation is not visualized. Aorta: The aortic root was not well visualized. Venous: The inferior vena cava was not well visualized. IAS/Shunts: No atrial level shunt detected by color flow Doppler.  LEFT VENTRICLE PLAX 2D LVOT diam:     2.70 cm LV SV:         148 LV SV Index:   58 LVOT Area:     5.73 cm  AORTIC VALVE AV Area (Vmax):    11.08 cm AV Area (Vmean):   8.97 cm AV Area (VTI):     9.21 cm AV Vmax:           89.40 cm/s AV Vmean:          68.300 cm/s AV VTI:            0.161 m AV Peak Grad:      3.2 mmHg AV Mean Grad:      2.0 mmHg LVOT Vmax:         173.00 cm/s LVOT Vmean:        107.000 cm/s LVOT VTI:          0.259 m LVOT/AV VTI ratio: 1.61  SHUNTS Systemic VTI:  0.26 m Systemic Diam:  2.70 cm Candee Furbish MD Electronically signed by Candee Furbish MD Signature Date/Time: 07/20/2020/1:43:49 PM    Final     Assessment: 40 y.o. male with PMH of opiate addiction on suboxone admitted for HA, fever and AMS. Found to have sepsis, AKI, and bacteremia. Mom admitted that pt use IV opiates and meth. Now off pressors, but on cefepime and CRRT. 2D echo and TEE showed large AV vegetation and severe AR and AI. MRI brain showed Dozens of primarily punctate acute infarctions scattered throughout the cerebellum and both cerebral hemispheres consistent with microembolic infarctions, consistent with endocarditis. Not candidate for CTA head and neck, will do MRA brain and CUS to rule out mycotic aneurysms. Continue Abx and CRRT. Pending cardiovascular surgery consult.    Stroke Risk Factors - IVDU  Plan: - HgbA1c, fasting lipid panel - MRA  of the brain without contrast - Carotid dopplers - continue Abx per ID. No antiplatelet needed at this time.  - continue CRRT per nephrology - CVTS consult pending - Frequent neuro checks - will follow  Thank you for this consultation and allowing [place] to participate in the care of this patient.  Rosalin Hawking, MD PhD Stroke Neurology 07/24/2020 4:08 PM

## 2020-07-24 NOTE — Progress Notes (Signed)
NAME:  Alex [name], MRN:  [id number], DOB:  04-03-1980, LOS: 6 ADMISSION DATE:  07/18/2020, CONSULTATION DATE:  4/26 REFERRING MD:  Dr. Florina Ou, CHIEF COMPLAINT:  AMS   History of Present Illness:  40 yo male smoker brought to [facility] ER with altered mental status.  Noted to have headache and feeling feverish before admission.  Found to have hypotension, AKI, thrombocytopenia, splenomegaly, lactic acidosis, elevated bilirubin, hypoglycemia.  Started on antibiotics and pressors for possible sepsis, but no clear source of infection.  Normal renal function from February 2022.  Uses suboxone for history of substance abuse.  UDS positive for amphetamines.  Transfer to [facility] for further therapy.  Hx from chart, medical team, and pt's mother.  Pertinent  Medical History  Polysubstance Abuse - amphetamines, heroin. On Suboxone.   Ruptured Disc  Significant Hospital Events: Including procedures, antibiotic start and stop dates in addition to other pertinent events   . 4/26 transfer from [facility] to [facility] . CT head 4/26 >> no acute findings . CT abd/pelvis 4/26 >> cholelithiasis w/o inflammatory chagnes, mild hepatomegaly (25 cm), moderate splenomegaly (16.7 cm), 2 mm non obstructing kidney stone on Lt . 4/27 CVL placed. Pressors escalating.  . 4/28 intubated for TEE, CVC, HD, and A-line placed. CRRT started  . Remains on CRRT . MRI on 4/30-multiple infarcts  Interim History / Subjective:   No acute overnight events.  Patient is awake and able to follow simple commands on exam today.  Denies any pain.  Objective   Blood pressure (!) 81/26, pulse 89, temperature 97.9 F (36.6 C), temperature source Oral, resp. rate 17, height '6\' 4"'$  (1.93 m), weight 126.9 kg, SpO2 100 %.    Vent Mode: PRVC FiO2 (%):  [40 %] 40 % Set Rate:  [15 bmp] 15 bmp Vt Set:  [600 mL] 600 mL PEEP:  [8 cmH20] 8 cmH20 Pressure Support:  [10 cmH20] 10 cmH20 Plateau Pressure:  [19 cmH20] 19 cmH20   Intake/Output Summary (Last 24  hours) at 07/24/2020 0854 Last data filed at 07/24/2020 0800 Gross per 24 hour  Intake 2853.1 ml  Output 4662 ml  Net -1808.9 ml   Filed Weights   07/22/20 0500 07/23/20 0500 07/24/20 0417  Weight: 128.2 kg 129.4 kg 126.9 kg    Examination:  General: No acute distress, awake and following simple commands, spontaneously moving all extremities HEENT: Moist oral mucosa, endotracheal tube in place Neuro: Following simple commands, moving all 4 extremities CV: Regular rate and rhythm, no murmurs rubs or gallops PULM: Clear to auscultation bilaterally GI: Soft, bowel sounds present, nontender to palpation Extremities: Skin is warm and dry, significant generalized edema Skin: no rashes or lesions  Labs/imaging that I havepersonally reviewed    4/28 ECHO Left ventricular EF  60 to 65%. nromal LV and RV function. Perforated left coronary cusp with severe aortic regurgitation. Large  36 x 19m mobile vegetation on ventricular surface of left coronary cusp.  The aortic valve is abnormal. Aortic valve regurgitation is severe.   4/30 MRI:  IMPRESSION: Dozens of primarily punctate acute infarctions scattered throughout the cerebellum and both cerebral hemispheres consistent with micro embolic infarctions from the heart or ascending aorta. Several are associated with petechial blood products as outlined above. Septic emboli not excluded given the clinical history.  Resolved Hospital Problem list   Hyponatremia   Assessment & Plan:   [name] who presented with AMS found to be in septic shock secondary to serratia bacteremia, requiring pressor support. MRI  brain demonstrated multiple infarcts likely in the setting of septic emboli. He required intubation for TEE which confirmed endocarditis with severe AI. He has been difficult to wean from propofol and ventilatory support.  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  He is no longer requiring pressor support. Unclear surgical plan at this point, will discuss with cardiothoracic surgery before attempting to wean from ventilatory support. -Continue cefepime -Neurology formally consulted for stroke  Acute respiratory failure Patient was initially intubated for TEE.  Significantly improved, mentating well and able to follow simple commands.  Can consider weaning off of ventilatory support once we have a surgical plan regarding his endocarditis.  Endocarditis with severe AI Continue cefepime.  Infectious disease following, appreciate recommendations.  Appears that previously cardiothoracic surgery deemed the patient not a surgical candidate due to multiorgan failure.  Will follow-up with CTS regarding surgical plans.  Acute kidney injury Likely ischemic ATN in the setting of septic shock and septic emboli.  Requiring CRRT since 4/28, appreciate nephrology recommendations.  Per nephrology, likely can transition to inpatient HD this week.  Patient is still not producing any urine. -Bladder scan per shift  Metabolic encephalopathy Significantly improved.  Patient is awake and able to follow commands today.  Anemia Thrombocytopenia Hemoglobin 8.5, improved from 7.5 yesterday.  Platelets also improved to 63, from 37.   -Continue to trend CBC  Polysubstance abuse -Suboxone, amphetamines -Cessation education when appropriate    Best practice   Diet:  Tube Feed  Pain/Anxiety/Delirium protocol (if indicated): No VAP protocol (if indicated): Yes DVT prophylaxis: SCD GI prophylaxis: PPI Glucose control:  SSI No Central venous access:  N/A Arterial line:  N/A Foley:  N/A Mobility:  bed rest  PT consulted: N/A Last date of multidisciplinary goals of care discussion [updated mother at bedside 4/30. ] Code Status:  full code Disposition: ICU   Alex Dolinar, DO PGY-2 IMTS

## 2020-07-24 NOTE — Progress Notes (Signed)
[facility] Infectious Diseases Follow Up Note  Patient Identification: Patient Name: Alex [name] MRN: [id number] [place] Date: 07/18/2020  1:39 AM Age: 40 y.o.Today's Date: 07/24/2020   Reason for Visit: Follow-up on endocarditis  Active Problems:   Amphetamine abuse ([place])   Sepsis ([place])   Septic shock ([place])   Altered mental status   Acute renal failure with oliguria (HCC)   Elevated LFTs   Pressure injury of skin   Multi-organ failure with heart failure (HCC)   Aortic valve endocarditis   Antibiotics: Cefepime 4/25-current                    Total days of antibiotics 8  Lines/Tubes: PIV's, right subclavian CVC 4/26, left IJ HD catheter, left radial arterial line, NG tube  Interval Events: Remains afebrile, leukocytosis is downtrending, repeat blood cultures no growth in 3 days   Assessment Serratia marcescens bacteremia/aortic valve endocarditis with multiple embolic infarctions throughout cerebellum and both cerebral hemispheres.  Possible septic emboli  Severe AVR with perforated aortic cusp Acute respiratory failure status post intubation Septic shock off pressors AKI on CRRT IVDU Hepatosplenomegaly Thrombocytopenia   Recommendations Continue cefepime for now. Will avoid adding aminoglycosides or fluoroquinolones given patient has rapidly cleared the bacteremia and potential for more toxicity. ? Formal  Cardiothoracic surgery consult Monitor CBC and BMP on IV antibiotics Following   Rest of the management as per the primary team. Thank you for the consult. Please page with pertinent questions or concerns.  ______________________________________________________________________ Subjective patient seen and examined at the bedside. Intubated, following complains. Denies pain or discomfort   Vitals BP (!) 81/26   Pulse 84   Temp 97.9 F (36.6 C) (Oral)   Resp 17   Ht '6\' 4"'$  (1.93 m)   Wt 126.9 kg    SpO2 100%   BMI 34.05 kg/m     Physical Exam Constitutional:  Intubated, following commands     Comments:   Cardiovascular:     Rate and Rhythm: Normal rate and regular rhythm.     Heart sounds: RRR  Pulmonary:     Effort: Pulmonary effort is normal.     Comments: Loud vent sounds   Abdominal:     Palpations: Abdomen is soft.     Tenderness: Non tender   Musculoskeletal:        General: No swelling or tenderness.   Skin:    Comments: No obvious rashes   Neurological:     General: unable to assess   Psychiatric:        Mood and Affect: difficult to assess    Pertinent Microbiology Results for orders placed or performed during the hospital encounter of 07/18/20  Resp Panel by RT-PCR (Flu A&B, Covid) Nasopharyngeal Swab     Status: None   Collection Time: 07/18/20  3:20 AM   Specimen: Nasopharyngeal Swab; Nasopharyngeal(NP) swabs in vial transport medium  Result Value Ref Range Status   SARS Coronavirus 2 by RT PCR NEGATIVE NEGATIVE Final    Comment: (NOTE) SARS-CoV-2 target nucleic acids are NOT DETECTED.  The SARS-CoV-2 RNA is generally detectable in upper respiratory specimens during the acute phase of infection. The lowest concentration of SARS-CoV-2 viral copies this assay can detect is 138 copies/mL. A negative result does not preclude SARS-Cov-2 infection and should not be used as the sole basis for treatment or other patient management decisions. A negative result may occur with  improper specimen collection/handling, submission of specimen other than nasopharyngeal swab, presence  of viral mutation(s) within the areas targeted by this assay, and inadequate number of viral copies(<138 copies/mL). A negative result must be combined with clinical observations, patient history, and epidemiological information. The expected result is Negative.  Fact Sheet for Patients:  EntrepreneurPulse.com.au  Fact Sheet for Healthcare Providers:   IncredibleEmployment.be  This test is no t yet approved or cleared by the [place] FDA and  has been authorized for detection and/or diagnosis of SARS-CoV-2 by FDA under an Emergency Use Authorization (EUA). This EUA will remain  in effect (meaning this test can be used) for the duration of the COVID-19 declaration under Section 564(b)(1) of the Act, 21 U.S.C.section 360bbb-3(b)(1), unless the authorization is terminated  or revoked sooner.       Influenza A by PCR NEGATIVE NEGATIVE Final   Influenza B by PCR NEGATIVE NEGATIVE Final    Comment: (NOTE) The Xpert Xpress SARS-CoV-2/FLU/RSV plus assay is intended as an aid in the diagnosis of influenza from Nasopharyngeal swab specimens and should not be used as a sole basis for treatment. Nasal washings and aspirates are unacceptable for Xpert Xpress SARS-CoV-2/FLU/RSV testing.  Fact Sheet for Patients: EntrepreneurPulse.com.au  Fact Sheet for Healthcare Providers: IncredibleEmployment.be  This test is not yet approved or cleared by the [place] FDA and has been authorized for detection and/or diagnosis of SARS-CoV-2 by FDA under an Emergency Use Authorization (EUA). This EUA will remain in effect (meaning this test can be used) for the duration of the COVID-19 declaration under Section 564(b)(1) of the Act, 21 U.S.C. section 360bbb-3(b)(1), unless the authorization is terminated or revoked.  Performed at [facility], [address] [place], [place] [address]   Culture, blood (single)     Status: Abnormal   Collection Time: 07/18/20  3:30 AM   Specimen: BLOOD RIGHT FOREARM  Result Value Ref Range Status   Specimen Description   Final    BLOOD RIGHT FOREARM Performed at [facility], [address] [place], [place] [address]    Special Requests   Final    BOTTLES DRAWN AEROBIC AND ANAEROBIC Blood Culture adequate volume Performed at [facility], [address] [place], [place] [address]    Culture  Setup Time   Final    GRAM NEGATIVE RODS IN BOTH AEROBIC AND ANAEROBIC BOTTLES Gram Stain Report Called to,Read Back By and Verified With: PITTS,R AT [facility] ICU AT [id number] ON 07/18/20 BY HUFFINES,S CRITICAL RESULT CALLED TO, READ BACK BY AND VERIFIED WITH: [facility] 07/19/20 0451 JDW Performed at [facility], [address] [place], [place] [address]    Culture SERRATIA MARCESCENS (A)  Final   Report Status 07/21/2020 FINAL  Final   Organism ID, Bacteria SERRATIA MARCESCENS  Final      Susceptibility   Serratia marcescens - MIC*    CEFAZOLIN >=64 RESISTANT Resistant     CEFEPIME <=0.12 SENSITIVE Sensitive     CEFTAZIDIME <=1 SENSITIVE Sensitive     CEFTRIAXONE <=0.25 SENSITIVE Sensitive     CIPROFLOXACIN <=0.25 SENSITIVE Sensitive     GENTAMICIN <=1 SENSITIVE Sensitive     TRIMETH/SULFA <=20 SENSITIVE Sensitive     * SERRATIA MARCESCENS  Blood Culture ID Panel (Reflexed)     Status: Abnormal   Collection Time: 07/18/20  3:30 AM  Result Value Ref Range Status   Enterococcus faecalis NOT DETECTED NOT DETECTED Final   Enterococcus Faecium NOT DETECTED NOT DETECTED Final   Listeria monocytogenes NOT DETECTED NOT DETECTED Final   Staphylococcus  species NOT DETECTED NOT DETECTED Final   Staphylococcus aureus (BCID) NOT DETECTED NOT DETECTED Final   Staphylococcus epidermidis NOT DETECTED NOT DETECTED Final   Staphylococcus lugdunensis NOT DETECTED NOT DETECTED Final   Streptococcus species NOT DETECTED NOT DETECTED Final   Streptococcus agalactiae NOT DETECTED NOT DETECTED Final   Streptococcus pneumoniae NOT DETECTED NOT DETECTED Final   Streptococcus pyogenes NOT DETECTED NOT DETECTED Final   A.calcoaceticus-baumannii NOT DETECTED NOT DETECTED Final   Bacteroides fragilis NOT DETECTED NOT DETECTED Final   Enterobacterales DETECTED (A) NOT DETECTED Final    Comment: Enterobacterales represent a large order of gram negative bacteria,  not a single organism. CRITICAL RESULT CALLED TO, READ BACK BY AND VERIFIED WITH: G ABBOTT PHARMD 07/19/20 0451 JDW    Enterobacter cloacae complex NOT DETECTED NOT DETECTED Final   Escherichia coli NOT DETECTED NOT DETECTED Final   Klebsiella aerogenes NOT DETECTED NOT DETECTED Final   Klebsiella oxytoca NOT DETECTED NOT DETECTED Final   Klebsiella pneumoniae NOT DETECTED NOT DETECTED Final   Proteus species NOT DETECTED NOT DETECTED Final   Salmonella species NOT DETECTED NOT DETECTED Final   Serratia marcescens DETECTED (A) NOT DETECTED Final    Comment: CRITICAL RESULT CALLED TO, READ BACK BY AND VERIFIED WITH: G ABBOTT PHARMD 07/19/20 0451 JDW    Haemophilus influenzae NOT DETECTED NOT DETECTED Final   Neisseria meningitidis NOT DETECTED NOT DETECTED Final   Pseudomonas aeruginosa NOT DETECTED NOT DETECTED Final   Stenotrophomonas maltophilia NOT DETECTED NOT DETECTED Final   Candida albicans NOT DETECTED NOT DETECTED Final   Candida auris NOT DETECTED NOT DETECTED Final   Candida glabrata NOT DETECTED NOT DETECTED Final   Candida krusei NOT DETECTED NOT DETECTED Final   Candida parapsilosis NOT DETECTED NOT DETECTED Final   Candida tropicalis NOT DETECTED NOT DETECTED Final   Cryptococcus neoformans/gattii NOT DETECTED NOT DETECTED Final   CTX-M ESBL NOT DETECTED NOT DETECTED Final   Carbapenem resistance IMP NOT DETECTED NOT DETECTED Final   Carbapenem resistance KPC NOT DETECTED NOT DETECTED Final   Carbapenem resistance NDM NOT DETECTED NOT DETECTED Final   Carbapenem resist OXA 48 LIKE NOT DETECTED NOT DETECTED Final   Carbapenem resistance VIM NOT DETECTED NOT DETECTED Final    Comment: Performed at [facility], [address] [place], [place] [address]  Urine culture     Status: Abnormal   Collection Time: 07/18/20  7:02 AM   Specimen: Urine, Catheterized  Result Value Ref Range Status   Specimen Description   Final    URINE, CATHETERIZED Performed at [facility], [address] [place], [place] [address]    Special Requests   Final    NONE Performed at [facility], [address], [place], [place] [address]    Culture (A)  Final    <10,000 COLONIES/mL INSIGNIFICANT GROWTH Performed at [facility] [address], [place], [place] [address]    Report Status 07/19/2020 FINAL  Final  Culture, blood (routine x 2)     Status: None (Preliminary result)   Collection Time: 07/20/20  2:20 PM   Specimen: BLOOD RIGHT HAND  Result Value Ref Range Status   Specimen Description BLOOD RIGHT HAND  Final   Special Requests   Final    BOTTLES DRAWN AEROBIC AND ANAEROBIC Blood Culture adequate volume   Culture   Final    NO GROWTH 3 DAYS Performed at [facility], [place] [address] [place], [place] [address]  Report Status PENDING  Incomplete  Culture, blood (routine x 2)     Status: None (Preliminary result)   Collection Time: 07/20/20  2:25 PM   Specimen: BLOOD RIGHT HAND  Result Value Ref Range Status   Specimen Description BLOOD RIGHT HAND  Final   Special Requests   Final    BOTTLES DRAWN AEROBIC ONLY Blood Culture results may not be optimal due to an inadequate volume of blood received in culture bottles   Culture   Final    NO GROWTH 3 DAYS Performed at [facility], [place] [address], [place], [place] [address]    Report Status PENDING  Incomplete  MRSA PCR Screening     Status: None   Collection Time: 07/21/20  8:18 AM   Specimen: Nasal Mucosa; Nasopharyngeal  Result Value Ref Range Status   MRSA by PCR NEGATIVE NEGATIVE Final    Comment:        The GeneXpert MRSA Assay (FDA approved for NASAL specimens only), is one component of a comprehensive MRSA colonization surveillance program. It is not intended to diagnose MRSA infection nor to guide or monitor treatment for MRSA infections. Performed at [facility], [place] [address] [place], [place] [address]     Pertinent Lab. CBC Latest Ref Rng & Units  07/23/2020 07/22/2020 07/21/2020  WBC 4.0 - 10.5 K/uL 17.3(H) 24.2(H) -  Hemoglobin 13.0 - 17.0 g/dL 7.5(L) 8.9(L) 9.2(L)  Hematocrit 39.0 - 52.0 % 23.8(L) 28.3(L) 27.0(L)  Platelets 150 - 400 K/uL 37(L) 21(LL) -   CMP Latest Ref Rng & Units 07/24/2020 07/23/2020 07/23/2020  Glucose 70 - 99 mg/dL 140(H) 121(H) 131(H)  BUN 6 - 20 mg/dL 57(H) 53(H) 54(H)  Creatinine 0.61 - 1.24 mg/dL 2.86(H) 3.00(H) 3.27(H)  Sodium 135 - 145 mmol/L 133(L) 133(L) 134(L)  Potassium 3.5 - 5.1 mmol/L 4.7 4.4 4.4  Chloride 98 - 111 mmol/L 101 102 99  CO2 22 - 32 mmol/L '24 24 25  '$ Calcium 8.9 - 10.3 mg/dL 8.2(L) 8.2(L) 8.2(L)  Total Protein 6.5 - 8.1 g/dL - - -  Total Bilirubin 0.3 - 1.2 mg/dL - - -  Alkaline Phos 38 - 126 U/L - - -  AST 15 - 41 U/L - - -  ALT 0 - 44 U/L - - -     Pertinent Imaging today Plain films and CT images have been personally visualized and interpreted; radiology reports have been reviewed. Decision making incorporated into the Impression / Recommendations.  MRI brain 07/22/20 IMPRESSION: Dozens of primarily punctate acute infarctions scattered throughout the cerebellum and both cerebral hemispheres consistent with micro embolic infarctions from the heart or ascending aorta. Several are associated with petechial blood products as outlined above. Septic emboli not excluded given the clinical history. I have spent more than 35 minutes for this patient encounter including review of prior medical records, coordination of care  with greater than 50% of time being face to face/counseling and discussing diagnostics/treatment plan with the patient/family.  Electronically signed by:   Rosiland Oz, MD Infectious Disease Physician [facility] Pager: [phone]

## 2020-07-24 NOTE — Progress Notes (Signed)
Pharmacy Antibiotic Note  Alex [name] is a 40 y.o. male admitted on 07/18/2020 and found to have Serratia bacteremia with TEE showing endocarditis. Pharmacy has been consulted for Cefepime dosing.  The patient was initiated on CRRT 4/28.  Cefepime started at q12 hour dosing.  If high effluent rates achieved (>35) - can consider q8h dosing. CRRT was stopped for ~1 hour yesterday evening - no dose adjustments required.  Plan: - Continue Cefepime 2g IV every 12 hours - Will continue to follow CRRT tolerance, culture results, LOT, and antibiotic de-escalation plans   Height: '6\' 4"'$  (193 cm) Weight: 126.9 kg (279 lb 12.2 oz) IBW/kg (Calculated) : 86.8  Temp (24hrs), Avg:98.1 F (36.7 C), Min:97.6 F (36.4 C), Max:98.6 F (37 C)  Recent Labs  Lab 07/18/20 0450 07/18/20 1135 07/18/20 1351 07/18/20 2039 07/19/20 0126 07/19/20 0140 07/20/20 0246 07/20/20 2032 07/21/20 0702 07/21/20 1600 07/22/20 0407 07/22/20 1510 07/23/20 0346 07/23/20 1540 07/24/20 0304  WBC  --  10.3 14.0* 11.9*  --    < > 13.0*  --  15.4* 20.6* 24.2*  --  17.3*  --   --   CREATININE  --  4.83* 4.88* 5.37*  --    < > 7.19*   < >  --  4.38* 3.60* 3.41* 3.27* 3.00* 2.86*  LATICACIDVEN 6.1* 4.8* 4.1* 2.6* 1.8  --   --   --   --   --   --   --   --   --   --    < > = values in this interval not displayed.    Estimated Creatinine Clearance: 51.4 mL/min (A) (by C-G formula based on SCr of 2.86 mg/dL (H)).    No Known Allergies  Antimicrobials this admission: Vancomycin 4/26 x 1 Cefepime 4/26 >>  Dose adjustments this admission: N/A  Microbiology results: 4/26 Flu/COVID >> neg 4/26 BCx >> Serratia 4/28 BCx >> ngtd  Thank you for allowing pharmacy to be a part of this patient's care.  Dimple Nanas, PharmD PGY-1 Acute Care Pharmacy Resident Office: [phone] 07/24/2020 9:16 AM   **Pharmacist phone directory can now be found on [email or web address] ([email or web address]).  Listed under [place].

## 2020-07-24 NOTE — Progress Notes (Signed)
[facility] NEPHROLOGY PROGRESS NOTE  Assessment/ Plan: Pt is a 40 y.o. yo male with history of substance abuse, ruptured disc who was initially presented to ER with altered mental status found to be septic shock and Serratia bacteremia/AV endocarditis seen as a consultation for AKI requiring CRRT.  #Anuric acute kidney injury likely ischemic ATN in the setting of septic shock/septic emboli.  Kidney ultrasound without hydronephrosis. Started CRRT on 4/28-->current.   Potassium level acceptable and tolerating UF goal 50-100 cc/hour, plan to continue current CRRT prescription for another 24h but likely can transition to iHD this week  Cont Current fluid settings.    #Serratia more concerns bacteremia/AV endocarditis: With history of substance abuse.  Seen by ID, on IV cefepime.  Not surgical consideration at this time.   #Septic shock, multiorgan failure: Off pressors, per CCM, as above.  #Vent dependent respiratory failure managed by PCCM.  #Acute encephalopathy due to sepsis/septic emboli: Monitor mental status.  #Thrombocytopenia and anemia probably because of sepsis and acute illness.  Monitor lab.  Subjective:   Seen and examined in ICU.  Remains anuric.    Not requiring pressors   Mother at bedside, updated  Objective Vital signs in last 24 hours: Vitals:   07/24/20 1213 07/24/20 1230 07/24/20 1300 07/24/20 1330  BP: (!) 112/39 (!) 101/40 (!) 110/37 (!) 118/41  Pulse:  89 89 90  Resp:  18 16 19   Temp:      TempSrc:      SpO2: 100% 100% 100% 100%  Weight:      Height:       Weight change: -2.5 kg  Intake/Output Summary (Last 24 hours) at 07/24/2020 1355 Last data filed at 07/24/2020 1300 Gross per 24 hour  Intake 2748.45 ml  Output 4593 ml  Net -1844.55 ml     Labs: Basic Metabolic Panel: Recent Labs  Lab 07/23/20 0346 07/23/20 1540 07/24/20 0304  NA 134* 133* 133*  K 4.4 4.4 4.7  CL 99 102 101  CO2 25 24 24   GLUCOSE 131* 121* 140*  BUN 54*  53* 57*  CREATININE 3.27* 3.00* 2.86*  CALCIUM 8.2* 8.2* 8.2*  PHOS 3.4 3.9 3.7   Liver Function Tests: Recent Labs  Lab 07/18/20 1351 07/19/20 0140 07/20/20 0246 07/20/20 2032 07/23/20 0346 07/23/20 1540 07/24/20 0304  AST 58* 51* 44*  --   --   --   --   ALT 25 23 21   --   --   --   --   ALKPHOS 569* 326* 179*  --   --   --   --   BILITOT 6.0* 7.0* 8.1*  --   --   --   --   PROT 6.0* 5.8* 6.0*  --   --   --   --   ALBUMIN 1.9* 1.6* 1.6*   < > 1.6* 1.6* 1.6*   < > = values in this interval not displayed.   No results for input(s): LIPASE, AMYLASE in the last 168 hours. Recent Labs  Lab 07/18/20 0317  AMMONIA 22   CBC: Recent Labs  Lab 07/18/20 0200 07/18/20 1135 07/21/20 0702 07/21/20 1600 07/21/20 1959 07/22/20 0407 07/23/20 0346 07/24/20 0900  WBC 10.9*   < > 15.4* 20.6*  --  24.2* 17.3* 18.2*  NEUTROABS 9.4*  --  11.2*  --   --   --   --   --   HGB 13.1   < > 9.4* 9.5*   < >  8.9* 7.5* 8.5*  HCT 40.8   < > 30.2* 29.9*   < > 28.3* 23.8* 27.6*  MCV 86.6   < > 87.0 86.2  --  86.5 86.9 87.9  PLT 49*   < > 14* 19*  --  21* 37* 63*   < > = values in this interval not displayed.   Cardiac Enzymes: Recent Labs  Lab 07/18/20 0330  CKTOTAL 79   CBG: Recent Labs  Lab 07/23/20 1928 07/23/20 2356 07/24/20 0305 07/24/20 0752 07/24/20 1106  GLUCAP 108* 117* 135* 125* 132*    Iron Studies:  No results for input(s): IRON, TIBC, TRANSFERRIN, FERRITIN in the last 72 hours. Studies/Results: MR BRAIN WO CONTRAST  Result Date: 07/22/2020 CLINICAL DATA:  Headache, fever and altered mental status. Possible sepsis. Polysubstance abuse. EXAM: MRI HEAD WITHOUT CONTRAST TECHNIQUE: Multiplanar, multiecho pulse sequences of the brain and surrounding structures were obtained without intravenous contrast. 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 Most of these are 3-4 mm in size or less. The largest infarction is in the left parieto-occipital junction region measuring 1.7 cm. There is involvement of the central pons, both cerebellar hemispheres, both cerebral hemispheres. A small amount of hemorrhage is associated with lesions in the cerebellar vermis, the left parietooccipital junction, the right posterior frontal brain in the right parietal brain. Punctate blood products evident with some of the other small infarctions. No evidence of vasogenic edema. No mass effect or shift. Given the history of polysubstance abuse, septic emboli are not excluded. No hydrocephalus or extra-axial collection. No evidence of pre-existing brain insult. Vascular: Major vessels at the base of the brain show flow. Skull and upper cervical spine: Negative Sinuses/Orbits: Clear/normal Other: Bilateral mastoid effusions. 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 Several are associated with petechial blood products as outlined above. Septic emboli not excluded given the clinical history. Electronically Signed   By: Nelson Chimes M.D.   On: 07/22/2020 17:27    Medications: Infusions: .  prismasol BGK 4/2.5 400 mL/hr at 07/24/20 0125  . sodium chloride Stopped (07/22/20 1629)  . sodium chloride 10 mL/hr at 07/24/20 1300  . sodium chloride 20 mL/hr at 07/19/20 2000  . amiodarone    . ceFEPime (MAXIPIME) IV Stopped (07/24/20 0745)  . feeding supplement (PIVOT 1.5 CAL) 1,000 mL (07/23/20 2138)  . fentaNYL infusion INTRAVENOUS 75 mcg/hr (07/24/20 1300)  . norepinephrine (LEVOPHED) Adult infusion Stopped (07/22/20 2108)  . prismasol BGK 2/2.5 replacement solution 300 mL/hr at 07/24/20 0307  . prismasol BGK 4/2.5 1,800 mL/hr at 07/24/20 1333  . vasopressin Stopped  (07/22/20 2256)    Scheduled Medications: . B-complex with vitamin C  1 tablet Per Tube Daily  . chlorhexidine gluconate (MEDLINE KIT)  15 mL Mouth Rinse BID  . Chlorhexidine Gluconate Cloth  6 each Topical Daily  . docusate  100 mg Per Tube BID  . feeding supplement (PROSource TF)  45 mL Per Tube QID  . fentaNYL (SUBLIMAZE) injection  100 mcg Intravenous Once  . mouth rinse  15 mL Mouth Rinse 10 times per day  . pantoprazole (PROTONIX) IV  40 mg Intravenous Q24H  . polyethylene glycol  17 g Per Tube Daily  . sodium chloride flush  10-40 mL Intracatheter Q12H  have reviewed scheduled and prn medications.  Physical Exam: General: Critically ill looking male intubated, sedated Heart:RRR, s1s2 nl Lungs: Coarse breath sound bilateral Abdomen:soft, Non-tender, non-distended Extremities:  LE edema+ Dialysis Access: IJ temporary HD catheter placed by ICU on 4/28  Maaz Spiering B Queena Monrreal 07/24/2020,1:55 PM  LOS: 6 days

## 2020-07-24 NOTE — Progress Notes (Signed)
VASCULAR LAB    Right Carotid duplex has been performed.  See CV proc for preliminary results.   Cayde Held, RVT 07/24/2020, 4:14 PM

## 2020-07-25 ENCOUNTER — Inpatient Hospital Stay (HOSPITAL_COMMUNITY): Payer: 59

## 2020-07-25 DIAGNOSIS — R4182 Altered mental status, unspecified: Secondary | ICD-10-CM | POA: Diagnosis not present

## 2020-07-25 DIAGNOSIS — I339 Acute and subacute endocarditis, unspecified: Secondary | ICD-10-CM | POA: Diagnosis not present

## 2020-07-25 DIAGNOSIS — I6340 Cerebral infarction due to embolism of unspecified cerebral artery: Secondary | ICD-10-CM

## 2020-07-25 DIAGNOSIS — N179 Acute kidney failure, unspecified: Secondary | ICD-10-CM | POA: Diagnosis not present

## 2020-07-25 DIAGNOSIS — I358 Other nonrheumatic aortic valve disorders: Secondary | ICD-10-CM | POA: Diagnosis not present

## 2020-07-25 DIAGNOSIS — R34 Anuria and oliguria: Secondary | ICD-10-CM | POA: Diagnosis not present

## 2020-07-25 DIAGNOSIS — I38 Endocarditis, valve unspecified: Secondary | ICD-10-CM

## 2020-07-25 DIAGNOSIS — R609 Edema, unspecified: Secondary | ICD-10-CM

## 2020-07-25 DIAGNOSIS — J9601 Acute respiratory failure with hypoxia: Secondary | ICD-10-CM | POA: Diagnosis not present

## 2020-07-25 DIAGNOSIS — Z9911 Dependence on respirator [ventilator] status: Secondary | ICD-10-CM | POA: Diagnosis not present

## 2020-07-25 DIAGNOSIS — L8990 Pressure ulcer of unspecified site, unspecified stage: Secondary | ICD-10-CM

## 2020-07-25 DIAGNOSIS — I351 Nonrheumatic aortic (valve) insufficiency: Secondary | ICD-10-CM | POA: Diagnosis not present

## 2020-07-25 DIAGNOSIS — A419 Sepsis, unspecified organism: Secondary | ICD-10-CM | POA: Diagnosis not present

## 2020-07-25 LAB — HEMOGLOBIN A1C
Hgb A1c MFr Bld: 5.7 % — ABNORMAL HIGH (ref 4.8–5.6)
Mean Plasma Glucose: 116.89 mg/dL

## 2020-07-25 LAB — CULTURE, BLOOD (ROUTINE X 2)
Culture: NO GROWTH
Culture: NO GROWTH
Special Requests: ADEQUATE

## 2020-07-25 LAB — CBC
HCT: 26.9 % — ABNORMAL LOW (ref 39.0–52.0)
Hemoglobin: 8.6 g/dL — ABNORMAL LOW (ref 13.0–17.0)
MCH: 27.6 pg (ref 26.0–34.0)
MCHC: 32 g/dL (ref 30.0–36.0)
MCV: 86.2 fL (ref 80.0–100.0)
Platelets: UNDETERMINED 10*3/uL (ref 150–400)
RBC: 3.12 MIL/uL — ABNORMAL LOW (ref 4.22–5.81)
RDW: 17.1 % — ABNORMAL HIGH (ref 11.5–15.5)
WBC: 22 10*3/uL — ABNORMAL HIGH (ref 4.0–10.5)
nRBC: 0.1 % (ref 0.0–0.2)

## 2020-07-25 LAB — RENAL FUNCTION PANEL
Albumin: 1.7 g/dL — ABNORMAL LOW (ref 3.5–5.0)
Albumin: 1.9 g/dL — ABNORMAL LOW (ref 3.5–5.0)
Anion gap: 8 (ref 5–15)
Anion gap: 9 (ref 5–15)
BUN: 54 mg/dL — ABNORMAL HIGH (ref 6–20)
BUN: 69 mg/dL — ABNORMAL HIGH (ref 6–20)
CO2: 24 mmol/L (ref 22–32)
CO2: 24 mmol/L (ref 22–32)
Calcium: 8.2 mg/dL — ABNORMAL LOW (ref 8.9–10.3)
Calcium: 8.5 mg/dL — ABNORMAL LOW (ref 8.9–10.3)
Chloride: 101 mmol/L (ref 98–111)
Chloride: 98 mmol/L (ref 98–111)
Creatinine, Ser: 2.54 mg/dL — ABNORMAL HIGH (ref 0.61–1.24)
Creatinine, Ser: 3.14 mg/dL — ABNORMAL HIGH (ref 0.61–1.24)
GFR, Estimated: 32 mL/min — ABNORMAL LOW (ref >60)
GFR, Estimated: 25 mL/min — ABNORMAL LOW (ref >60)
Glucose, Bld: 124 mg/dL — ABNORMAL HIGH (ref 70–99)
Glucose, Bld: 95 mg/dL (ref 70–99)
Phosphorus: 4.3 mg/dL (ref 2.5–4.6)
Phosphorus: 5.7 mg/dL — ABNORMAL HIGH (ref 2.5–4.6)
Potassium: 5 mmol/L (ref 3.5–5.1)
Potassium: 5.6 mmol/L — ABNORMAL HIGH (ref 3.5–5.1)
Sodium: 133 mmol/L — ABNORMAL LOW (ref 135–145)
Sodium: 131 mmol/L — ABNORMAL LOW (ref 135–145)

## 2020-07-25 LAB — COOXEMETRY PANEL
Carboxyhemoglobin: 1 % (ref 0.5–1.5)
Methemoglobin: 1.1 % (ref 0.0–1.5)
O2 Saturation: 70.6 %
Total hemoglobin: 8.9 g/dL — ABNORMAL LOW (ref 12.0–16.0)

## 2020-07-25 LAB — GLUCOSE, CAPILLARY
Glucose-Capillary: 100 mg/dL — ABNORMAL HIGH (ref 70–99)
Glucose-Capillary: 102 mg/dL — ABNORMAL HIGH (ref 70–99)
Glucose-Capillary: 104 mg/dL — ABNORMAL HIGH (ref 70–99)
Glucose-Capillary: 106 mg/dL — ABNORMAL HIGH (ref 70–99)
Glucose-Capillary: 115 mg/dL — ABNORMAL HIGH (ref 70–99)
Glucose-Capillary: 121 mg/dL — ABNORMAL HIGH (ref 70–99)

## 2020-07-25 LAB — BRAIN NATRIURETIC PEPTIDE: B Natriuretic Peptide: 153.5 pg/mL — ABNORMAL HIGH (ref 0.0–100.0)

## 2020-07-25 LAB — LIPID PANEL
Cholesterol: 138 mg/dL (ref 0–200)
HDL: <10 mg/dL — ABNORMAL LOW (ref >40)
Total CHOL/HDL Ratio: UNDETERMINED RATIO
Triglycerides: 348 mg/dL — ABNORMAL HIGH (ref <150)
VLDL: 70 mg/dL — ABNORMAL HIGH (ref 0–40)

## 2020-07-25 LAB — MAGNESIUM: Magnesium: 2.8 mg/dL — ABNORMAL HIGH (ref 1.7–2.4)

## 2020-07-25 MED ORDER — CLONAZEPAM 1 MG PO TABS: 1.0000 mg | ORAL_TABLET | Freq: Two times a day (BID) | ORAL | Status: DC

## 2020-07-25 MED ORDER — CHLORHEXIDINE GLUCONATE 0.12 % MT SOLN
OROMUCOSAL | Status: AC
  Administered 2020-07-25: 15 mL via OROMUCOSAL
  Filled 2020-07-25: qty 15

## 2020-07-25 MED ORDER — QUETIAPINE FUMARATE 25 MG PO TABS
25.0000 mg | ORAL_TABLET | Freq: Two times a day (BID) | ORAL | Status: DC
  Administered 2020-07-25 – 2020-07-26 (×3): 25 mg
  Filled 2020-07-25 (×4): qty 1

## 2020-07-25 MED ORDER — CLONAZEPAM 0.1 MG/ML ORAL SUSPENSION
1.0000 mg | Freq: Two times a day (BID) | ORAL | Status: DC
  Filled 2020-07-25: qty 10

## 2020-07-25 MED ORDER — MIDAZOLAM HCL 2 MG/2ML IJ SOLN
4.0000 mg | Freq: Once | INTRAMUSCULAR | Status: AC
  Administered 2020-07-25: 4 mg via INTRAVENOUS
  Filled 2020-07-25: qty 4

## 2020-07-25 MED ORDER — CLONAZEPAM 1 MG PO TABS
1.0000 mg | ORAL_TABLET | Freq: Two times a day (BID) | ORAL | Status: DC
  Administered 2020-07-25 – 2020-07-28 (×5): 1 mg
  Filled 2020-07-25 (×6): qty 1

## 2020-07-25 MED ORDER — QUETIAPINE FUMARATE 25 MG PO TABS: 25.0000 mg | ORAL_TABLET | Freq: Two times a day (BID) | ORAL | Status: DC

## 2020-07-25 MED ORDER — MIDAZOLAM HCL 2 MG/2ML IJ SOLN
2.0000 mg | INTRAMUSCULAR | Status: AC
  Administered 2020-07-25 (×3): 2 mg via INTRAVENOUS
  Filled 2020-07-25 (×5): qty 2

## 2020-07-25 NOTE — Progress Notes (Signed)
[facility] NEPHROLOGY PROGRESS NOTE  Assessment/ Plan: Pt is a 40 y.o. yo male with history of substance [name], Alex disc who was initially presented to ER with altered mental status found to be septic shock and Serratia bacteremia/AV endocarditis seen as a consultation for AKI requiring CRRT.  #Anuric acute kidney injury likely ischemic ATN in the setting of septic shock/septic emboli.  Kidney ultrasound without hydronephrosis. Started CRRT on 4/28-->current.   Potassium level acceptable and tolerating UF goal 50-100 cc/hour, plan to continue current CRRT prescription for another 24h to assist with vent weening and will work with CCM about timing to iHD.    #Serratia more concerns bacteremia/AV endocarditis: With history of substance [name].  Seen by ID, on IV cefepime.  Not surgical consideration at this time but maybe in future?  #Septic shock, multiorgan failure: Off pressors, per CCM, as above.  #Vent dependent respiratory failure managed by PCCM.  #Acute encephalopathy due to sepsis/septic emboli: Monitor mental status.  #Thrombocytopenia and anemia probably because of sepsis and acute illness.  Monitor lab.  Subjective:   Seen and examined in ICU.  Remains anuric.    Not requiring pressors, tol UF 1.9L net neg yesterday  K 5.0, P 4.3.    Objective Vital signs in last 24 hours: Vitals:   07/25/20 0945 07/25/20 1000 07/25/20 1015 07/25/20 1030  BP: (!) 120/45 (!) 112/40 98/70 (!) 116/45  Pulse: 94 88 98 100  Resp: 19 (!) 21 (!) 23 19  Temp:      TempSrc:      SpO2: 100% 100% 100% 100%  Weight:      Height:       Weight change: -0.4 kg  Intake/Output Summary (Last 24 hours) at 07/25/2020 1040 Last data filed at 07/25/2020 1000 Gross per 24 hour  Intake 2649.17 ml  Output 4688 ml  Net -2038.83 ml     Labs: Basic Metabolic Panel: Recent Labs  Lab 07/24/20 0304 07/24/20 1600 07/25/20 0333  NA 133* 133* 133*  K 4.7 5.1 5.0  CL 101 101 101   CO2 24 24 24   GLUCOSE 140* 131* 124*  BUN 57* 62* 54*  CREATININE 2.86* 2.73* 2.54*  CALCIUM 8.2* 7.9* 8.2*  PHOS 3.7 4.2 4.3   Liver Function Tests: Recent Labs  Lab 07/18/20 1351 07/19/20 0140 07/20/20 0246 07/20/20 2032 07/24/20 0304 07/24/20 1600 07/25/20 0333  AST 58* 51* 44*  --   --   --   --   ALT 25 23 21   --   --   --   --   ALKPHOS 569* 326* 179*  --   --   --   --   BILITOT 6.0* 7.0* 8.1*  --   --   --   --   PROT 6.0* 5.8* 6.0*  --   --   --   --   ALBUMIN 1.9* 1.6* 1.6*   < > 1.6* 1.7* 1.7*   < > = values in this interval not displayed.   No results for input(s): LIPASE, AMYLASE in the last 168 hours. No results for input(s): AMMONIA in the last 168 hours. CBC: Recent Labs  Lab 07/21/20 0702 07/21/20 1600 07/21/20 1959 07/22/20 0407 07/23/20 0346 07/24/20 0900 07/25/20 0333  WBC 15.4* 20.6*  --  24.2* 17.3* 18.2* 22.0*  NEUTROABS 11.2*  --   --   --   --   --   --   HGB 9.4* 9.5*   < > 8.9*  7.5* 8.5* 8.6*  HCT 30.2* 29.9*   < > 28.3* 23.8* 27.6* 26.9*  MCV 87.0 86.2  --  86.5 86.9 87.9 86.2  PLT 14* 19*  --  21* 37* 63* PLATELET CLUMPS NOTED ON SMEAR, UNABLE TO ESTIMATE   < > = values in this interval not displayed.   Cardiac Enzymes: No results for input(s): CKTOTAL, CKMB, CKMBINDEX, TROPONINI in the last 168 hours. CBG: Recent Labs  Lab 07/24/20 1523 07/24/20 1944 07/24/20 2348 07/25/20 0326 07/25/20 0759  GLUCAP 122* 128* 85 115* 121*    Iron Studies:  No results for input(s): IRON, TIBC, TRANSFERRIN, FERRITIN in the last 72 hours. Studies/Results: VAS US CAROTID  Result Date: 07/24/2020 Carotid Arterial Duplex Study Patient Name:  Alex [name]  Date of Exam:   07/24/2020 Medical Rec #: [id number]          Accession #:    [id number] Date of Birth: 1980/11/20          Patient Gender: M Patient Age:   39Y Exam Location:  [facility] Procedure:      VAS US CAROTID Referring Phys: [id number] Rosalin Hawking  --------------------------------------------------------------------------------  Indications:       CVA. Risk Factors:      Current smoker. Other Factors:     Endocarditis, sepsis, IV drug [name]. Limitations        Today's exam was limited due to Dialysis acces in left neck,                    ventilation. Comparison Study:  No prior study on file Performing Technologist: Sharion Dove RVS  Examination Guidelines: A complete evaluation includes B-mode imaging, spectral Doppler, color Doppler, and power Doppler as needed of all accessible portions of each vessel. Bilateral testing is considered an integral part of a complete examination. Limited examinations for reoccurring indications may be performed as noted.  Right Carotid Findings: +----------+--------+--------+--------+------------------+--------+           PSV cm/sEDV cm/sStenosisPlaque DescriptionComments +----------+--------+--------+--------+------------------+--------+ CCA Prox  252     2                                          +----------+--------+--------+--------+------------------+--------+ CCA Distal271     3                                          +----------+--------+--------+--------+------------------+--------+ ICA Prox  118     19                                         +----------+--------+--------+--------+------------------+--------+ ICA Distal151     25                                         +----------+--------+--------+--------+------------------+--------+ +----------+--------+-------+--------+-------------------+           PSV cm/sEDV cmsDescribeArm Pressure (mmHG) +----------+--------+-------+--------+-------------------+ Subclavian101                                        +----------+--------+-------+--------+-------------------+ +---------+--------+---+--------+--+  VertebralPSV cm/s112EDV cm/s11 +---------+--------+---+--------+--+  Left Carotid Findings:  +----------+--------+--------+--------+-------------------+           PSV cm/sEDV cm/sDescribeArm Pressure (mmHG) +----------+--------+--------+--------+-------------------+ Subclavian211                                         +----------+--------+--------+--------+-------------------+   Summary: Right Carotid: There was no evidence of thrombus, dissection, atherosclerotic                plaque or stenosis in the cervical carotid system. Left Carotid: Unable to insonate secondary to Dialysis access in left jugular. Vertebrals:  Right vertebral artery demonstrates antegrade flow. Subclavians: Normal flow hemodynamics were seen in bilateral subclavian              arteries. *See table(s) above for measurements and observations.     Preliminary     Medications: Infusions: .  prismasol BGK 4/2.5 400 mL/hr at 07/25/20 0320  . sodium chloride Stopped (07/22/20 1629)  . sodium chloride 10 mL/hr at 07/25/20 1000  . sodium chloride 20 mL/hr at 07/19/20 2000  . amiodarone    . ceFEPime (MAXIPIME) IV Stopped (07/25/20 0640)  . feeding supplement (PIVOT 1.5 CAL) 65 mL/hr at 07/25/20 0400  . fentaNYL infusion INTRAVENOUS 100 mcg/hr (07/25/20 1000)  . norepinephrine (LEVOPHED) Adult infusion Stopped (07/25/20 0346)  . prismasol BGK 2/2.5 replacement solution 300 mL/hr at 07/24/20 2000  . prismasol BGK 4/2.5 1,800 mL/hr at 07/25/20 1028  . vasopressin Stopped (07/22/20 2256)    Scheduled Medications: . B-complex with vitamin C  1 tablet Per Tube Daily  . chlorhexidine gluconate (MEDLINE KIT)  15 mL Mouth Rinse BID  . Chlorhexidine Gluconate Cloth  6 each Topical Daily  . docusate  100 mg Per Tube BID  . feeding supplement (PROSource TF)  45 mL Per Tube QID  . fentaNYL (SUBLIMAZE) injection  100 mcg Intravenous Once  . mouth rinse  15 mL Mouth Rinse 10 times per day  . pantoprazole sodium  40 mg Per Tube Daily  . polyethylene glycol  17 g Per Tube Daily  . sodium chloride flush  10-40 mL  Intracatheter Q12H    have reviewed scheduled and prn medications.  Physical Exam: General: Critically ill looking male intubated, sedated Heart:RRR, s1s2 nl Lungs: Coarse breath sound bilateral Abdomen:soft, Non-tender, non-distended Extremities:  LE edema+ Dialysis Access: IJ temporary HD catheter placed by ICU on 4/28  Kinsleigh Ludolph B Pranshu Lyster 07/25/2020,10:40 AM  LOS: 7 days

## 2020-07-25 NOTE — Progress Notes (Signed)
STROKE TEAM PROGRESS NOTE   SUBJECTIVE (INTERVAL HISTORY) His RNs are is at the bedside.  He is still intubated but awake alert with intermittent agitation, given seroquel and clonazepam. Plan to extubate tomorrow. MRA head and neck no mycotic aneurysm. CUS no significant finding on the right. CVTS on board, potentially candidate for valve replacement surgery.   OBJECTIVE Temp:  [98 F (36.7 C)-100.1 F (37.8 C)] 98 F (36.7 C) (05/03 1100) Pulse Rate:  [76-102] 96 (05/03 1630) Cardiac Rhythm: Normal sinus rhythm (05/03 1600) Resp:  [12-39] 24 (05/03 1630) BP: (88-131)/(26-70) 116/42 (05/03 1630) SpO2:  [93 %-100 %] 93 % (05/03 1630) FiO2 (%):  [40 %] 40 % (05/03 1511) Weight:  [126.5 kg] 126.5 kg (05/03 0331)  Recent Labs  Lab 07/24/20 2348 07/25/20 0326 07/25/20 0759 07/25/20 1110 07/25/20 1543  GLUCAP 85 115* 121* 102* 106*   Recent Labs  Lab 07/21/20 0325 07/21/20 1600 07/22/20 0407 07/22/20 1510 07/23/20 0346 07/23/20 1540 07/24/20 0304 07/24/20 1600 07/25/20 0333 07/25/20 1542  NA 136   < > 135   < > 134* 133* 133* 133* 133* 131*  K 4.7   < > 4.9   < > 4.4 4.4 4.7 5.1 5.0 5.6*  CL 101   < > 101   < > 99 102 101 101 101 98  CO2 23   < > 24   < > '25 24 24 24 24 24  '$ GLUCOSE 99   < > 133*   < > 131* 121* 140* 131* 124* 95  BUN 91*   < > 56*   < > 54* 53* 57* 62* 54* 69*  CREATININE 5.80*  5.92*   < > 3.60*   < > 3.27* 3.00* 2.86* 2.73* 2.54* 3.14*  CALCIUM 8.5*   < > 8.2*   < > 8.2* 8.2* 8.2* 7.9* 8.2* 8.5*  MG 3.0*  --  2.8*  --  2.8*  --  2.5*  --  2.8*  --   PHOS 4.4   < > 4.7*   < > 3.4 3.9 3.7 4.2 4.3 5.7*   < > = values in this interval not displayed.   Recent Labs  Lab 07/19/20 0140 07/20/20 0246 07/20/20 2032 07/23/20 1540 07/24/20 0304 07/24/20 1600 07/25/20 0333 07/25/20 1542  AST 51* 44*  --   --   --   --   --   --   ALT 23 21  --   --   --   --   --   --   ALKPHOS 326* 179*  --   --   --   --   --   --   BILITOT 7.0* 8.1*  --   --   --    --   --   --   PROT 5.8* 6.0*  --   --   --   --   --   --   ALBUMIN 1.6* 1.6*   < > 1.6* 1.6* 1.7* 1.7* 1.9*   < > = values in this interval not displayed.   Recent Labs  Lab 07/21/20 0702 07/21/20 1600 07/21/20 1959 07/22/20 0407 07/23/20 0346 07/24/20 0900 07/25/20 0333  WBC 15.4* 20.6*  --  24.2* 17.3* 18.2* 22.0*  NEUTROABS 11.2*  --   --   --   --   --   --   HGB 9.4* 9.5* 9.2* 8.9* 7.5* 8.5* 8.6*  HCT 30.2* 29.9* 27.0* 28.3* 23.8* 27.6* 26.9*  MCV 87.0 86.2  --  86.5 86.9 87.9 86.2  PLT 14* 19*  --  21* 37* 63* PLATELET CLUMPS NOTED ON SMEAR, UNABLE TO ESTIMATE   No results for input(s): CKTOTAL, CKMB, CKMBINDEX, TROPONINI in the last 168 hours. No results for input(s): LABPROT, INR in the last 72 hours. No results for input(s): COLORURINE, LABSPEC, [place], GLUCOSEU, HGBUR, BILIRUBINUR, KETONESUR, PROTEINUR, UROBILINOGEN, NITRITE, LEUKOCYTESUR in the last 72 hours.  Invalid input(s): APPERANCEUR     Component Value Date/Time   CHOL 138 07/25/2020 0334   TRIG 348 (H) 07/25/2020 0334   HDL <10 (L) 07/25/2020 0334   CHOLHDL UNABLE TO CALCULATE. 07/25/2020 0334   VLDL 70 (H) 07/25/2020 0334   LDLCALC NOT CALCULATED 07/25/2020 0334   Lab Results  Component Value Date   HGBA1C 5.7 (H) 07/25/2020      Component Value Date/Time   LABOPIA NONE DETECTED 07/18/2020 0317   COCAINSCRNUR NONE DETECTED 07/18/2020 0317   LABBENZ NONE DETECTED 07/18/2020 0317   AMPHETMU POSITIVE (A) 07/18/2020 0317   THCU NONE DETECTED 07/18/2020 0317   LABBARB NONE DETECTED 07/18/2020 0317    No results for input(s): ETH in the last 168 hours.  I have personally reviewed the radiological images below and agree with the radiology interpretations.  CT ABDOMEN PELVIS WO CONTRAST  Result Date: 07/18/2020 CLINICAL DATA:  Acute, nonlocalized abdominal pain, acute renal failure, leukocytosis EXAM: CT ABDOMEN AND PELVIS WITHOUT CONTRAST TECHNIQUE: Multidetector CT imaging of the abdomen and  pelvis was performed following the standard protocol without IV contrast. COMPARISON:  None. FINDINGS: Lower chest: Mild bibasilar atelectasis. The visualized heart and pericardium are unremarkable. Hepatobiliary: Cholelithiasis without pericholecystic inflammatory change. Mild hepatomegaly with the liver measuring 25 cm in craniocaudal dimension. Liver otherwise unremarkable. No intra or extrahepatic biliary ductal dilation. Pancreas: Unremarkable Spleen: Moderate splenomegaly with the spleen measuring up to 16.7 cm in greatest dimension. No definite intra splenic lesion identified on this noncontrast examination. Adrenals/Urinary Tract: The adrenal glands are unremarkable. The kidneys are normal in size and position. 2 mm nonobstructing calculus within the lower pole of the left kidney. The kidneys are otherwise unremarkable. Foley catheter balloon is seen within the decompressed bladder lumen. Stomach/Bowel: Stomach is within normal limits. Appendix appears normal. No evidence of bowel wall thickening, distention, or inflammatory changes. No free intraperitoneal gas or fluid. Vascular/Lymphatic: No significant vascular findings are present. No enlarged abdominal or pelvic lymph nodes. Reproductive: Prostate is unremarkable. Other: No abdominal wall hernia.  Rectum unremarkable. Musculoskeletal: No lytic or blastic bone lesion. No acute bone abnormality. IMPRESSION: Mild to moderate hepatosplenomegaly. Cholelithiasis without superimposed inflammatory change. Minimal left nonobstructing nephrolithiasis. Electronically Signed   By: Fidela Salisbury MD   On: 07/18/2020 05:53   DG Chest 1 View  Result Date: 07/18/2020 CLINICAL DATA:  40 year old male with central line placement. EXAM: CHEST  1 VIEW COMPARISON:  Earlier radiograph dated 07/18/2020. FINDINGS: Right subclavian central venous line with tip over the cavoatrial junction. No pneumothorax. There is mild cardiomegaly with mild vascular congestion. No focal  consolidation, pleural effusion, or pneumothorax. No acute osseous pathology. IMPRESSION: Interval placement of a right subclavian central venous line with tip over the cavoatrial junction. No pneumothorax. Electronically Signed   By: Anner Crete M.D.   On: 07/18/2020 23:35   CT Head Wo Contrast  Result Date: 07/18/2020 CLINICAL DATA:  Delirium, acute renal failure, leukocytosis EXAM: CT HEAD WITHOUT CONTRAST TECHNIQUE: Contiguous axial images were obtained from the base of the skull through the  vertex without intravenous contrast. COMPARISON:  None. FINDINGS: Brain: Normal anatomic configuration. No abnormal intra or extra-axial mass lesion or fluid collection. No abnormal mass effect or midline shift. No evidence of acute intracranial hemorrhage or infarct. Ventricular size is normal. Cerebellum unremarkable. Vascular: No hyperdense vessel or unexpected calcification. Skull: Intact Sinuses/Orbits: Paranasal sinuses are clear. Orbits are unremarkable. Other: Mastoid air cells and middle ear cavities are clear. IMPRESSION: No acute intracranial abnormality. Electronically Signed   By: Fidela Salisbury MD   On: 07/18/2020 05:47   MR ANGIO HEAD WO CONTRAST  Result Date: 07/25/2020 CLINICAL DATA:  Stroke, follow-up. EXAM: MRA HEAD WITHOUT CONTRAST TECHNIQUE: Angiographic images of the Circle of Willis were obtained using MRA technique without intravenous contrast. COMPARISON:  Brain MRI 07/22/2020. FINDINGS: Moderate to moderately severe motion degradation, limiting evaluation. This precludes adequate evaluation for arterial stenoses. This also significantly limits evaluation for intracranial aneurysms. The intracranial internal carotid arteries, middle cerebral arteries and anterior cerebral arteries are patent without appreciable proximal large vessel occlusion. The vertebral arteries, basilar artery and posterior cerebral arteries are patent without appreciable proximal large vessel occlusion. Apparent  moderate stenosis within the right posterior cerebral artery at the P1/P2 junction (series 2, image 83). Posterior communicating arteries are hypoplastic or absent bilaterally. IMPRESSION: No evidence of intracranial proximal large vessel occlusion. Significant motion degradation, precluding adequate evaluation for intracranial arterial stenosis. Apparent moderate stenosis within the right posterior cerebral artery at the P1/P2 junction. Electronically Signed   By: Kellie Simmering DO   On: 07/25/2020 13:19   MR ANGIO NECK WO CONTRAST  Result Date: 07/25/2020 CLINICAL DATA:  Provided history: Septic arterial embolism. EXAM: MRA NECK WITHOUT CONTRAST TECHNIQUE: Multiplanar and multiecho pulse sequences of the neck were obtained without intravenous contrast. Angiographic images of the neck were obtained using MRA technique without intravenous contrast. COMPARISON:  Brain MRI 07/22/2020. FINDINGS: The examination is moderately motion degraded, limiting evaluation. Additionally, the origins of the left common carotid and left vertebral arteries are excluded from the field of view. The visible common carotid and internal carotid arteries are patent without appreciable hemodynamically significant stenosis. Tortuosity of the distal cervical ICAs bilaterally. The visible vertebral arteries are patent within the neck with antegrade flow. Left vertebral artery dominant. Apparent moderate/severe stenosis at the origin of the non dominant right vertebral artery. Additionally, there is an apparent moderate stenosis within the right vertebral artery at the V1/V2 junction. However, these apparent stenoses could potentially be exaggerate by motion artifact and noncontrast technique. IMPRESSION: The examination is moderately motion degraded, limiting evaluation. Additionally, the origins of the left common carotid and left vertebral arteries are excluded from the field of view. The visible common carotid and internal carotid arteries  are patent within the neck without appreciable hemodynamically significant stenosis. The visible vertebral arteries are patent within the neck with antegrade flow. Apparent stenoses within the non-dominant right vertebral artery at the vessel origin (moderate/severe), and at the V1/V2 junction (moderate). However, these apparent stenoses could potentially be exaggerated by motion artifact and non-contrast technique. Electronically Signed   By: Kellie Simmering DO   On: 07/25/2020 13:10   MR BRAIN WO CONTRAST  Result Date: 07/22/2020 CLINICAL DATA:  Headache, fever and altered mental status. Possible sepsis. Polysubstance abuse. EXAM: MRI HEAD WITHOUT CONTRAST TECHNIQUE: Multiplanar, multiecho pulse sequences of the brain and surrounding structures were obtained without intravenous contrast. 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 Most of these are 3-4 mm in size or less. The largest infarction is in the left parieto-occipital junction region measuring 1.7 cm. There is involvement of the central pons, both cerebellar hemispheres, both cerebral hemispheres. A small amount of hemorrhage is associated with lesions in the cerebellar vermis, the left parietooccipital junction, the right posterior frontal brain in the right parietal brain. Punctate blood products evident with some of the other small infarctions. No evidence of vasogenic edema. No mass effect or shift. Given the history of polysubstance abuse, septic emboli are not excluded. No hydrocephalus or extra-axial collection. No evidence of pre-existing brain insult. Vascular: Major vessels at the base of the brain show flow. Skull and upper cervical spine: Negative Sinuses/Orbits: Clear/normal Other: Bilateral mastoid effusions. 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 Several are associated with petechial blood products as outlined above. Septic emboli not excluded given the clinical history. Electronically Signed   By: Nelson Chimes M.D.   On: 07/22/2020 17:27   US RENAL  Result Date: 07/18/2020 CLINICAL DATA:  Acute kidney injury. EXAM: RENAL / URINARY TRACT ULTRASOUND COMPLETE COMPARISON:  Noncontrast CT earlier today. FINDINGS: Right Kidney: Renal measurements: 13.1 x 6.5 x 6.4 cm = volume: 281 mL. Echogenicity within normal limits. No hydronephrosis. No focal renal lesion or stone. Left Kidney: Renal measurements: 14.2 x 7.2 x 6.3 cm = volume: 338 mL. Echogenicity within normal limits. No hydronephrosis. No focal renal lesion. Nonobstructing intrarenal stone on CT earlier today is not visualized. Bladder: Decompressed by Foley catheter. Other: Splenomegaly as seen on CT earlier today. Cystic lesion at the hilum measures 3.3 cm. What is labeled by the technologist as incidental spleen represents bowel. Technically challenging exam due to habitus. IMPRESSION: 1. No obstructive uropathy. 2. Punctate nonobstructing left renal stone on CT earlier today is not seen by ultrasound. Electronically Signed   By: Keith Rake M.D.   On: 07/18/2020 15:04   DG CHEST PORT 1 VIEW  Result Date: 07/22/2020 CLINICAL DATA:  Respiratory failure EXAM: PORTABLE CHEST 1 VIEW COMPARISON:  July 20, 2020 FINDINGS: The ET tube and left central line are in good position. A right central line is in good position. The feeding tube terminates below today's film. Cardiomediastinal silhouette is stable. No pulmonary nodules or masses. No focal infiltrates. No overt edema. IMPRESSION: 1. Support apparatus as above. 2. No other acute abnormalities. Electronically Signed   By: Dorise Bullion III M.D   On: 07/22/2020 14:57   DG CHEST PORT 1  VIEW  Result Date: 07/20/2020 CLINICAL DATA:  Central line placement. EXAM: PORTABLE CHEST 1 VIEW COMPARISON:  Chest x-ray from same day at 12:09 p.m. FINDINGS: New left internal jugular dialysis catheter with tip in the proximal SVC. Unchanged right subclavian central venous catheter. Endotracheal tube remains in good position with the tip 3.7 cm above the carina. Normal heart size. Patchy airspace disease throughout both lungs, worse on the right, not significantly changed. No pleural effusion or pneumothorax. No acute osseous abnormality. IMPRESSION: 1. New left internal jugular dialysis catheter with tip in the proximal SVC. No pneumothorax. 2. Unchanged multifocal airspace disease. Electronically Signed   By: Titus Dubin M.D.   On: 07/20/2020 18:29   DG CHEST PORT 1 VIEW  Result Date: 07/20/2020 CLINICAL DATA:  Status post intubation. EXAM: PORTABLE CHEST 1 VIEW COMPARISON:  Single-view of the chest 07/19/2020. FINDINGS: Endotracheal tube is in place with the tip in good position at the level the clavicular heads. Right subclavian catheter is unchanged. New patchy bilateral airspace disease is much worse on the right. Heart size is enlarged. No pneumothorax. IMPRESSION: ETT in good position. Right much worse than left patchy airspace disease worrisome for pneumonia. Electronically Signed   By: Inge Rise M.D.   On: 07/20/2020 12:48   DG Chest Port 1 View  Result Date: 07/19/2020 CLINICAL DATA:  Hypoxia EXAM: PORTABLE CHEST 1 VIEW COMPARISON:  07/18/2020 FINDINGS: Right subclavian central venous catheter tip noted within the right atrium. Lung volumes are slightly small, but are stable, with mild left basilar atelectasis. No superimposed confluent pulmonary infiltrate. No pneumothorax or pleural effusion. Cardiac size is within normal limits. Pulmonary vascularity is normal. IMPRESSION: Stable examination.  Mild left basilar atelectasis. Electronically Signed   By: Fidela Salisbury MD   On:  07/19/2020 05:15   DG Chest Portable 1 View  Result Date: 07/18/2020 CLINICAL DATA:  Fever EXAM: PORTABLE CHEST 1 VIEW COMPARISON:  None. FINDINGS: Lung volumes are small, but are symmetric and are clear. No pneumothorax or pleural effusion. Cardiac size within normal limits. Pulmonary vascularity is normal. No acute bone abnormality. IMPRESSION: Pulmonary hypoinflation. Electronically Signed   By: Fidela Salisbury MD   On: 07/18/2020 04:09   DG Abd Portable 1V  Result Date: 07/25/2020 CLINICAL DATA:  Orogastric tube placement. EXAM: PORTABLE ABDOMEN - 1 VIEW COMPARISON:  July 22, 2020 FINDINGS: Orogastric tube with tip and side port overlying the stomach. Weighted enteric feeding catheter with tip overlying the stomach. Endotracheal tube with tip overlying the distal thoracic trachea. Left approach central venous catheter with tip overlying the SVC. The bowel gas pattern is normal. IMPRESSION: Orogastric tube with tip and side port overlying the stomach. Electronically Signed   By: Dahlia Bailiff MD   On: 07/25/2020 10:38   ECHOCARDIOGRAM COMPLETE  Result Date: 07/19/2020    ECHOCARDIOGRAM REPORT   Patient Name:   SYLYS SOBIERAJ Gruenewald Date of Exam: 07/19/2020 Medical Rec #:  [id number]         Height:       76.0 in Accession #:    [id number]        Weight:       287.0 lb Date of Birth:  Jan 15, 1981         BSA:          2.583 m Patient Age:    39 years          BP:           115/48 mmHg Patient Gender: M                 HR:           85 bpm. Exam Location:  Inpatient Procedure: 2D Echo, Cardiac Doppler and Color Doppler  Results discussed with Dr [name] at 1158. Indications:     Shock  History:         Patient has no prior history of Echocardiogram examinations.                  Risk Factors:Current Smoker.  Sonographer:     Cammy Brochure Referring Phys:  [place] Diagnosing Phys: Oswaldo Milian MD IMPRESSIONS  1. Left ventricular ejection fraction, by estimation, is 60 to 65%. The left  ventricle has normal function. The left ventricle has no regional wall  motion abnormalities. Left ventricular diastolic parameters were normal.  2. Right ventricular systolic function is normal. The right ventricular size is normal. Tricuspid regurgitation signal is inadequate for assessing PA pressure.  3. The mitral valve is normal in structure. No evidence of mitral valve regurgitation.  4. The aortic valve was not well visualized. Aortic valve regurgitation is severe. No aortic stenosis is present. Poor visualization of aortic valve but leaflets appears thickened, concerning for vegetation on AV causing eccentric AI that appears severe. Holodiastolic flow reveral is seen in thoracic aorta, consistent with severe AI. Recommend TEE for further evaluation. FINDINGS  Left Ventricle: Left ventricular ejection fraction, by estimation, is 60 to 65%. The left ventricle has normal function. The left ventricle has no regional wall motion abnormalities. The left ventricular internal cavity size was normal in size. There is  no left ventricular hypertrophy. Left ventricular diastolic parameters were normal. Right Ventricle: The right ventricular size is normal. No increase in right ventricular wall thickness. Right ventricular systolic function is normal. Tricuspid regurgitation signal is inadequate for assessing PA pressure. Left Atrium: Left atrial size was normal in size. Right Atrium: Right atrial size was not well visualized. Pericardium: There is no evidence of pericardial effusion. Mitral Valve: The mitral valve is normal in structure. No evidence of mitral valve regurgitation. Tricuspid Valve: The tricuspid valve is normal in structure. Tricuspid valve regurgitation is trivial. Aortic Valve: The aortic valve was not well visualized. Aortic valve regurgitation is severe. No aortic stenosis is present. Aortic valve mean gradient measures 8.0 mmHg. Aortic valve peak gradient measures 13.8 mmHg. Aortic valve area, by  VTI measures 3.72 cm. Pulmonic Valve: The pulmonic valve was not well visualized. Pulmonic valve regurgitation is not visualized. Aorta: The aortic root is normal in size and structure. IAS/Shunts: The interatrial septum was not well visualized.  LEFT VENTRICLE PLAX 2D LVOT diam:     2.40 cm  Diastology LV SV:         117      LV e' medial:    9.68 cm/s LV SV Index:   45       LV E/e' medial:  6.6 LVOT Area:     4.52 cm LV e' lateral:   11.50 cm/s                         LV E/e' lateral: 5.6  RIGHT VENTRICLE             IVC RV Basal diam:  3.90 cm     IVC diam: 1.50 cm RV S prime:     15.90 cm/s TAPSE (M-mode): 2.5 cm LEFT ATRIUM             Index       RIGHT ATRIUM          Index LA diam:        3.80 cm 1.47 cm/m  RA Area:     9.47 cm LA Vol (A2C):   40.4 ml 15.64 ml/m RA Volume:   18.60 ml 7.20 ml/m LA Vol (A4C):   50.1 ml 19.40 ml/m LA Biplane Vol: 49.3 ml 19.09 ml/m  AORTIC VALVE AV Area (Vmax):    4.21 cm AV Area (Vmean):   3.53 cm AV Area (VTI):     3.72 cm AV Vmax:           186.00 cm/s AV Vmean:          137.000 cm/s AV VTI:  0.315 m AV Peak Grad:      13.8 mmHg AV Mean Grad:      8.0 mmHg LVOT Vmax:         173.00 cm/s LVOT Vmean:        107.000 cm/s LVOT VTI:          0.259 m LVOT/AV VTI ratio: 0.82  AORTA Ao Root diam: 3.40 cm MITRAL VALVE MV Area (PHT): 5.13 cm    SHUNTS MV Decel Time: 148 msec    Systemic VTI:  0.26 m MV E velocity: 64.00 cm/s  Systemic Diam: 2.40 cm MV A velocity: 47.50 cm/s MV E/A ratio:  1.35 Oswaldo Milian MD Electronically signed by Oswaldo Milian MD Signature Date/Time: 07/19/2020/11:54:58 AM    Final (Updated)    ECHO TEE  Result Date: 07/20/2020    TRANSESOPHOGEAL ECHO REPORT   Patient Name:   MISAEL BOZARD Mies Date of Exam: 07/20/2020 Medical Rec #:  [id number]         Height:       76.0 in Accession #:    [id number]        Weight:       282.4 lb Date of Birth:  April 25, 1980         BSA:          2.565 m Patient Age:    39 years          BP:            130/48 mmHg Patient Gender: M                 HR:           90 bpm. Exam Location:  Inpatient Portions of this table do not appear on this page. Procedure: Transesophageal Echo, 3D Echo, Cardiac Doppler and Color Doppler Indications:    Bacteremia 790.7 / R78.81  History:        Patient has prior history of Echocardiogram examinations, most                 recent 07/19/2020. Polysubstance abuse.  Sonographer:    Darlina Sicilian RDCS Referring Phys: [phone] JILL D MCDANIEL PROCEDURE: After discussion of the risks and benefits of a TEE, an informed consent was obtained from a family member. The transesophogeal probe was passed without difficulty through the esophogus of the patient. Sedation performed by performing physician. Patients was under conscious sedation during this procedure. Image quality was excellent. The patient's vital signs; including heart rate, blood pressure, and oxygen saturation; remained stable throughout the procedure. The patient developed no complications during the procedure. Intubated, ETT in place. IMPRESSIONS  1. Left ventricular ejection fraction, by estimation, is 60 to 65%. The left ventricle has normal function. The left ventricle has no regional wall motion abnormalities.  2. Right ventricular systolic function is normal. The right ventricular size is normal.  3. No left atrial/left atrial appendage thrombus was detected.  4. The mitral valve is normal in structure. No evidence of mitral valve regurgitation.  5. Perforated left coronary cusp with severe aortic regurgitation. 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 The aortic valve is abnormal. Aortic valve regurgitation is severe.  6. Intubated, ETT in place. 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 FINDINGS  Left Ventricle: Left ventricular ejection fraction, by estimation, is 60 to 65%. The left ventricle has normal function. The left  ventricle has no regional wall motion abnormalities. The left ventricular internal  cavity size was normal in size. Right Ventricle: The right ventricular size is normal. No increase in right ventricular wall thickness. Right ventricular systolic function is normal. Left Atrium: Left atrial size was normal in size. No left atrial/left atrial appendage thrombus was detected. Right Atrium: Right atrial size was normal in size. Pericardium: There is no evidence of pericardial effusion. Mitral Valve: The mitral valve is normal in structure. No evidence of mitral valve regurgitation. Tricuspid Valve: The tricuspid valve is normal in structure. Tricuspid valve regurgitation is not demonstrated. Aortic Valve: Perforated left coronary cusp with severe aortic regurgitation. Large 36 x 107m mobile vegetation on ventricular surface of left coronary cusp. The aortic valve is abnormal. Aortic valve regurgitation is severe. Aortic valve mean gradient measures 2.0 mmHg. Aortic valve peak gradient measures 3.2 mmHg. Aortic valve area, by VTI measures 9.21 cm. A mobile vegetation is seen. The AoV vegetation measures 30 mm x 15 mm. Pulmonic Valve: The pulmonic valve was normal in structure. Pulmonic valve regurgitation is not visualized. Aorta: The aortic root was not well visualized. Venous: The inferior vena cava was not well visualized. IAS/Shunts: No atrial level shunt detected by color flow Doppler.  LEFT VENTRICLE PLAX 2D LVOT diam:     2.70 cm LV SV:         148 LV SV Index:   58 LVOT Area:     5.73 cm  AORTIC VALVE AV Area (Vmax):    11.08 cm AV Area (Vmean):   8.97 cm AV Area (VTI):     9.21 cm AV Vmax:           89.40 cm/s AV Vmean:          68.300 cm/s AV VTI:            0.161 m AV Peak Grad:      3.2 mmHg AV Mean Grad:      2.0 mmHg LVOT Vmax:         173.00 cm/s LVOT Vmean:        107.000 cm/s LVOT VTI:          0.259 m LVOT/AV VTI ratio: 1.61  SHUNTS Systemic VTI:  0.26 m Systemic Diam: 2.70 cm MCandee FurbishMD  Electronically signed by MCandee FurbishMD Signature Date/Time: 07/20/2020/1:43:49 PM    Final    VAS [place]  Result Date: 07/24/2020 Carotid Arterial Duplex Study Patient Name:  JKAMANI RUTENBERGPRUITT  Date of Exam:   07/24/2020 Medical Rec #: [id number]         Accession #:    [id number] of Birth: 710/19/1982         Patient Gender: M Patient Age:   39Y [facility]:      [facility] [place] Referring Phys: [id number] Hawking--------------------------------------------------------------------------------  Indications:       CVA. Risk Factors:      Current smoker. Other Factors:     Endocarditis, sepsis, IV drug abuse. Limitations        Today's exam was limited due to Dialysis acces in left neck,                    ventilation. Comparison Study:  No prior study on file Performing Technologist: CSharion DoveRVS  Examination Guidelines: A complete evaluation includes B-mode imaging, spectral Doppler, color Doppler, and power Doppler as needed of all accessible portions of each vessel. Bilateral testing is considered an integral part of  a complete examination. Limited examinations for reoccurring indications may be performed as noted.  Right Carotid Findings: +----------+--------+--------+--------+------------------+--------+           PSV cm/sEDV cm/sStenosisPlaque DescriptionComments +----------+--------+--------+--------+------------------+--------+ CCA Prox  252     2                                          +----------+--------+--------+--------+------------------+--------+ CCA Distal271     3                                          +----------+--------+--------+--------+------------------+--------+ ICA Prox  118     19                                         +----------+--------+--------+--------+------------------+--------+ ICA Distal151     25                                          +----------+--------+--------+--------+------------------+--------+ +----------+--------+-------+--------+-------------------+           PSV cm/sEDV cmsDescribeArm Pressure (mmHG) +----------+--------+-------+--------+-------------------+ Subclavian101                                        +----------+--------+-------+--------+-------------------+ +---------+--------+---+--------+--+ VertebralPSV cm/s112EDV cm/s11 +---------+--------+---+--------+--+  Left Carotid Findings: +----------+--------+--------+--------+-------------------+           PSV cm/sEDV cm/sDescribeArm Pressure (mmHG) +----------+--------+--------+--------+-------------------+ [id number]                                         +----------+--------+--------+--------+-------------------+   Summary: Right Carotid: There was no evidence of thrombus, dissection, atherosclerotic                plaque or stenosis in the cervical carotid system. Left Carotid: Unable to insonate secondary to Dialysis access in left jugular. Vertebrals:  Right vertebral artery demonstrates antegrade flow. Subclavians: Normal flow hemodynamics were seen in bilateral subclavian              arteries. *See table(s) above for measurements and observations.     Preliminary    VAS [place] LOWER EXTREMITY VENOUS (DVT)  Result Date: 07/25/2020  Lower Venous DVT Study Patient Name:  DAIMON SARTORIUS  Date of Exam:   07/25/2020 Medical Rec #: [id number]          Accession #:    [id number] Date of Birth: July 07, 1980          Patient Gender: M Patient Age:   39Y Exam Location:  [facility] Procedure:      VAS [place] LOWER EXTREMITY VENOUS (DVT) Referring Phys: [id number] [place] --------------------------------------------------------------------------------  Indications: Edema.  Comparison Study: no prior Performing Technologist: Abram Sander RVS  Examination Guidelines: A complete evaluation includes B-mode imaging, spectral Doppler, color Doppler,  and power Doppler as needed of all accessible portions of each vessel. Bilateral testing is considered an integral part of a complete examination. Limited examinations for reoccurring indications  may be performed as noted. The reflux portion of the exam is performed with the patient in reverse Trendelenburg.  +---------+---------------+---------+-----------+----------+--------------+ RIGHT    CompressibilityPhasicitySpontaneityPropertiesThrombus Aging +---------+---------------+---------+-----------+----------+--------------+ CFV      Full           Yes      Yes                                 +---------+---------------+---------+-----------+----------+--------------+ SFJ      Full                                                        +---------+---------------+---------+-----------+----------+--------------+ FV Prox  Full                                                        +---------+---------------+---------+-----------+----------+--------------+ FV Mid   Full                                                        +---------+---------------+---------+-----------+----------+--------------+ FV DistalFull                                                        +---------+---------------+---------+-----------+----------+--------------+ PFV      Full                                                        +---------+---------------+---------+-----------+----------+--------------+ POP      Full           Yes      Yes                                 +---------+---------------+---------+-----------+----------+--------------+ PTV      Full                                                        +---------+---------------+---------+-----------+----------+--------------+ PERO     Full                                                        +---------+---------------+---------+-----------+----------+--------------+    +----+---------------+---------+-----------+----------+--------------+ LEFTCompressibilityPhasicitySpontaneityPropertiesThrombus Aging +----+---------------+---------+-----------+----------+--------------+ CFV Full           Yes      Yes                                 +----+---------------+---------+-----------+----------+--------------+  Summary: RIGHT: - There is no evidence of deep vein thrombosis in the lower extremity.  - No cystic structure found in the popliteal fossa.  LEFT: - No evidence of common femoral vein obstruction.  *See table(s) above for measurements and observations.    Preliminary      PHYSICAL EXAM  Temp:  [98 F (36.7 C)-100.1 F (37.8 C)] 98 F (36.7 C) (05/03 1100) Pulse Rate:  [76-102] 96 (05/03 1630) Resp:  [12-39] 24 (05/03 1630) BP: (88-131)/(26-70) 116/42 (05/03 1630) SpO2:  [93 %-100 %] 93 % (05/03 1630) FiO2 (%):  [40 %] 40 % (05/03 1511) Weight:  [126.5 kg] 126.5 kg (05/03 0331)  General - Well nourished, well developed, intubated not on sedation.  Ophthalmologic - fundi not visualized due to noncooperation.  Cardiovascular - Regular rate and rhythm.  Neuro - intubated off sedation, eyes closed, sleepy but open on voice, however, needed repetitive stimulation to keep following commands. Able to follow most midline and peripheral commands but with perseveration on the left hand. With eye opening, eyes in mid position, inconsistently blinking to visual threat bilaterally, not tracking, PERRL. Corneal reflex present, gag and cough present. Breathing over the vent, on weaning trils.  Facial symmetry not able to test due to ET tube.  Tongue protrusion midline. BUE and BLE 3/5 symmetrically. DTR 1+ and no babinski. Sensation, coordination not cooperative and gait not tested.   ASSESSMENT/PLAN 40 y.o. male with PMH of opiate addiction on suboxone admitted for HA, fever and AMS. Found to have sepsis, AKI, and bacteremia. Mom admitted that pt  use IV opiates and meth. Now off pressors, but on cefepime and CRRT. 2D echo and TEE showed large AV vegetation and severe AR and AI. MRI brain showed Dozens of primarily punctate acute infarctions scattered throughout the cerebellum and both cerebral hemispheres consistent with microembolic infarctions, consistent with endocarditis. MRA head and neck and CUS did not show mycotic aneurysms or large vessel stenosis. A1C 5.7 and TG 348 with calculated LDL around 60. Recommend to continue Abx and supportive care, wean off ventilation as able. Cardiovascular surgery on board for potential valve replacement surgery.     Plan: - continue Abx per ID. No antiplatelet needed at this time.  - continue CRRT per nephrology - CVTS on board to consider potential valve replacement surgery - wean off vent as able per CCM - no further recs from neurology at this time. Will be available as needed.   Hospital day # 7  Neurology will sign off. Please feel free to call with further questions. Thanks for the consult.  Rosalin Hawking, MD PhD Stroke Neurology 07/25/2020 4:46 PM    To contact Stroke Continuity provider, please refer to [email or web address] After hours, contact General Neurology

## 2020-07-25 NOTE — Consult Note (Signed)
[address]       [place] [address]             [id number]        Barnett A Pippenger  Medical Record #[id number] Date of Birth: 09-04-1980  Referring: No ref. provider found Primary Care: [facility] Primary Cardiologist:None  Chief Complaint:    Chief Complaint  Patient presents with  . Altered Mental Status    History of Present Illness:     40 year old male admitted through the ER with altered mental status.  He has a history of substance abuse along with IV drug abuse and has been on Suboxone.  Per his mother he began using IV drugs again.  On admission he was noted to be febrile, hypotensive, and multisystem organ.  He eventually was intubated, and echocardiogram showed evidence with vegetation.  CTS was consulted, he originally was thought to be a candidate due to his critical condition.  Since that point he has made some recovery.  He is no longer on vasopressor and inotropic support.  Since being on CRRT his pulmonary status has improved, and there are plans for extubation within the next day or so.     Past Medical History:  Diagnosis Date  . Ruptured lumbar disc     History reviewed. No pertinent surgical history.    Social History   Tobacco Use  Smoking Status Current Every Day Smoker  . Packs/day: 1.00  . Types: Cigarettes  Smokeless Tobacco Never Used    Social History   Substance and Sexual Activity  Alcohol Use No     No Known Allergies    Current Facility-Administered Medications  Medication Dose Route Frequency Provider Last Rate Last Admin  .  prismasol BGK 4/2.5 infusion   CRRT Continuous Claudia Desanctis, MD 400 mL/hr at 07/25/20 0320 New Bag at 07/25/20 0320  . 0.9 %  sodium chloride infusion  250 mL Intravenous Continuous Molpus, John, MD   Stopped at 07/22/20 1629  . 0.9 %  sodium chloride infusion  250 mL Intravenous Continuous Manuella Ghazi, Pratik D, DO 10 mL/hr at 07/25/20 1000  Infusion Verify at 07/25/20 1000  . 0.9 %  sodium chloride infusion   Intravenous Continuous Kathyrn Drown D, NP 20 mL/hr at 07/19/20 2000 New Bag at 07/19/20 2000  . acetaminophen (TYLENOL) 160 MG/5ML solution 500 mg  500 mg Per Tube Q6H PRN Audria Nine, DO      . acetaminophen (TYLENOL) suppository 650 mg  650 mg Rectal Q4H PRN Audria Nine, DO   650 mg at 07/20/20 1645  . amiodarone (NEXTERONE) IV bolus only 150 mg/100 mL  150 mg Intravenous Once PRN Margaretmary Lombard, MD      . B-complex with vitamin C tablet 1 tablet  1 tablet Per Tube Daily Merlene Laughter F, NP   1 tablet at 07/25/20 0818  . ceFEPIme (MAXIPIME) 2 g in sodium chloride 0.9 % 100 mL IVPB  2 g Intravenous Q12H Rolla Flatten, [facility]   Stopped at 07/25/20 1660  . chlorhexidine gluconate (MEDLINE KIT) (PERIDEX) 0.12 % solution 15 mL  15 mL Mouth Rinse BID Audria Nine, DO   15 mL at 07/25/20 0800  . Chlorhexidine Gluconate Cloth 2 % PADS 6 each  6 each Topical Daily Chesley Mires, MD   6 each at 07/24/20 0857  . docusate (COLACE) 50 MG/5ML liquid 100 mg  100 mg Per Tube BID Audria Nine,  DO   100 mg at 07/24/20 2126  . feeding supplement (PIVOT 1.5 CAL) liquid 1,000 mL  1,000 mL Per Tube Continuous Merlene Laughter F, NP 65 mL/hr at 07/25/20 0400 Infusion Verify at 07/25/20 0400  . feeding supplement (PROSource TF) liquid 45 mL  45 mL Per Tube QID Merlene Laughter F, NP   45 mL at 07/25/20 1000  . fentaNYL (SUBLIMAZE) bolus via infusion 50-100 mcg  50-100 mcg Intravenous Q15 min PRN Audria Nine, DO   100 mcg at 07/25/20 0340  . fentaNYL (SUBLIMAZE) injection 100 mcg  100 mcg Intravenous Once Etheleen Nicks, MD      . fentaNYL 2551mg in NS 2579m(1084mml) infusion-PREMIX  50-200 mcg/hr Intravenous Continuous MarAudria NineO 10 mL/hr at 07/25/20 1000 100 mcg/hr at 07/25/20 1000  . heparin injection 1,000-6,000 Units  1,000-6,000 Units CRRT PRN FosClaudia DesanctisD   2,800 Units at 07/25/20 1110  . MEDLINE  mouth rinse  15 mL Mouth Rinse 10 times per day MarAudria NineO   15 mL at 07/25/20 0959  . midazolam (VERSED) injection 2 mg  2 mg Intravenous Q2H PRN SomAnders SimmondsD   2 mg at 07/25/20 [id number] midazolam (VERSED) injection 2 mg  2 mg Intravenous Q30 min ClaNoemi Chapel DO      . midazolam (VERSED) injection 4 mg  4 mg Intravenous Once ClaNoemi Chapel DO      . norepinephrine (LEVOPHED) 16 mg in 250m49memix infusion  0-100 mcg/min Intravenous Titrated MarsAudria Nine   Stopped at 07/25/20 0346  . pantoprazole sodium (PROTONIX) 40 mg/20 mL oral suspension 40 mg  40 mg Per Tube Daily ClarNoemi ChapelDO   40 mg at 07/25/20 0817  . polyethylene glycol (MIRALAX / GLYCOLAX) packet 17 g  17 g Per Tube Daily MarsAudria Nine   17 g at 07/23/20 0931  . prismasol BGK 2/2.5 replacement solution   CRRT Continuous KrusJustin Mend 300 mL/hr at 07/24/20 2000 New Bag at 07/24/20 2000  . prismasol BGK 4/2.5 infusion   CRRT Continuous FostClaudia Desanctis 1,800 mL/hr at 07/25/20 1028 New Bag at 07/25/20 1028  . sodium chloride flush (NS) 0.9 % injection 10-40 mL  10-40 mL Intracatheter Q12H MarsAudria Nine   10 mL at 07/25/20 1000  . sodium chloride flush (NS) 0.9 % injection 10-40 mL  10-40 mL Intracatheter PRN MarsAudria Nine      . vasopressin (PITRESSIN) 20 Units in sodium chloride 0.9 % 100 mL infusion-*FOR SHOCK*  0-0.04 Units/min Intravenous Continuous MarsAudria Nine   Stopped at 07/22/20 2256    Medications Prior to Admission  Medication Sig Dispense Refill Last Dose  . buprenorphine-naloxone (SUBOXONE) 8-2 mg SUBL SL tablet Place 1 tablet under the tongue 2 (two) times daily. May place additional 1/2 tablet under the tongue daily as needed   Past Week at Unknown time  . lisinopril-hydrochlorothiazide (ZESTORETIC) 10-12.5 MG tablet Take 1 tablet by mouth daily.   Past Week at Unknown time  . naloxone (NARCAN) nasal spray 4 mg/0.1 mL Place 1 spray into the nose  once. As needed for up to 1 dose.   PRN    History reviewed. No pertinent family history.   Review of Systems:   Review of Systems  Unable to perform ROS: Intubated      Physical Exam: BP (!) 116/45   Pulse 100   Temp 98 F (36.7 C) (Oral)  Resp 19   Ht 6' 4"  (1.93 m)   Wt 126.5 kg   SpO2 100%   BMI 33.95 kg/m  Physical Exam Constitutional:      General: He is not in acute distress.    Appearance: He is ill-appearing. He is not toxic-appearing.  HENT:     Head: Normocephalic and atraumatic.  Cardiovascular:     Rate and Rhythm: Tachycardia present.  Pulmonary:     Comments: Vent Mode: PSV;CPAP FiO2 (%):  (40 %) 40 % Set Rate:  (15 bmp) 15 bmp Vt Set:  (600 mL) 600 mL PEEP:  (5 cmH20-8 cmH20) 5 cmH20 Pressure Support:  (5 cmH20-8 cmH20) 5 cmH20  Neurological:     Comments: Moving all extremities spontaneously.       Diagnostic Studies & Laboratory data:     Transesophageal Echo: 07/20/2020 IMPRESSIONS    1. Left ventricular ejection fraction, by estimation, is 60 to 65%. The  left ventricle has normal function. The left ventricle has no regional  wall motion abnormalities.  2. Right ventricular systolic function is normal. The right ventricular  size is normal.  3. No left atrial/left atrial appendage thrombus was detected.  4. The mitral valve is normal in structure. No evidence of mitral valve  regurgitation.  5. Perforated left coronary cusp with severe aortic regurgitation. Large  36 x 103m mobile vegetation on ventricular surface of left coronary cusp.  The aortic valve is abnormal. Aortic valve regurgitation is severe.  6. Intubated, ETT in place.    I have independently reviewed the above radiologic studies and discussed with the patient   Recent Lab Findings: Lab Results  Component Value Date   WBC 22.0 (H) 07/25/2020   HGB 8.6 (L) 07/25/2020   HCT 26.9 (L) 07/25/2020   PLT PLATELET CLUMPS NOTED ON SMEAR, UNABLE TO ESTIMATE  07/25/2020   GLUCOSE 124 (H) 07/25/2020   CHOL 138 07/25/2020   TRIG 348 (H) 07/25/2020   HDL <10 (L) 07/25/2020   LDLCALC NOT CALCULATED 07/25/2020   ALT 21 07/20/2020   AST 44 (H) 07/20/2020   NA 133 (L) 07/25/2020   K 5.0 07/25/2020   CL 101 07/25/2020   CREATININE 2.54 (H) 07/25/2020   BUN 54 (H) 07/25/2020   CO2 24 07/25/2020   TSH 2.611 07/18/2020   INR 1.4 (H) 07/18/2020   HGBA1C 5.7 (H) 07/25/2020      Assessment / Plan:   40year old male with history of IV drug use and presents with Serratia bacteremia with aortic valve endocarditis, severe aortic regurgitation due to coronary cusp separation.  There is also multiple septic emboli to the brain.  He is in acute renal failure on CRRT and he has decreased swelling.  He is no longer requiring vasoactive medications and is weaning the ventilator.  On review of his echocardiogram it does not appear as though he has an annular abscess, and he has no significant rhythm disturbances.  Given the size of the vegetation recent embolic strokes surgical valve replacement is not warranted.  He will require neuro clearance with an MRI to rule out any mycotic aneurysms.  He will also require dental clearance.  I explained to the patient's mother given his history of IV drug abuse recidivism is concerned, thus following his operation he admitted to the drug rehab center.  Once the patient is extubated further discussion with the sustained clean.    I  spent 55 minutes counseling the patient face to face.   HLucile Crater  Janaia Kozel 07/25/2020 1:13 PM

## 2020-07-25 NOTE — Progress Notes (Signed)
Lower extremity venous has been completed.   Preliminary results in CV Proc.   Abram Sander 07/25/2020 3:41 PM

## 2020-07-25 NOTE — Progress Notes (Addendum)
NAME:  Alex [name], MRN:  [id number], DOB:  08/09/1980, LOS: 7 ADMISSION DATE:  07/18/2020, CONSULTATION DATE:  4/26 REFERRING MD:  Dr. Florina Ou, CHIEF COMPLAINT:  AMS   History of Present Illness:  40 yo male smoker brought to [facility] ER with altered mental status.  Noted to have headache and feeling feverish before admission.  Found to have hypotension, AKI, thrombocytopenia, splenomegaly, lactic acidosis, elevated bilirubin, hypoglycemia.  Started on antibiotics and pressors for possible sepsis, but no clear source of infection.  Normal renal function from February 2022.  Uses suboxone for history of substance abuse.  UDS positive for amphetamines.  Transfer to [facility] for further therapy.  Hx from chart, medical team, and pt's mother.  Pertinent  Medical History  Polysubstance Abuse - amphetamines, heroin. On Suboxone.   Ruptured Disc  Significant Hospital Events: Including procedures, antibiotic start and stop dates in addition to other pertinent events   . 4/26 transfer from [facility] to [facility] . CT head 4/26 >> no acute findings . CT abd/pelvis 4/26 >> cholelithiasis w/o inflammatory chagnes, mild hepatomegaly (25 cm), moderate splenomegaly (16.7 cm), 2 mm non obstructing kidney stone on Lt . 4/27 CVL placed. Pressors escalating.  . 4/28 intubated for TEE, CVC, HD, and A-line placed. CRRT started  . Remains on CRRT . MRI on 4/30-multiple infarcts  Interim History / Subjective:   No acute overnight events.  Patient is awake and able to follow simple commands on exam today.  Denies any pain.  Objective   Blood pressure (!) 115/38, pulse 93, temperature 98.9 F (37.2 C), temperature source Axillary, resp. rate 17, height '6\' 4"'$  (1.93 m), weight 126.5 kg, SpO2 100 %.    Vent Mode: PRVC FiO2 (%):  [40 %] 40 % Set Rate:  [15 bmp] 15 bmp Vt Set:  [600 mL] 600 mL PEEP:  [5 cmH20-8 cmH20] 8 cmH20 Pressure Support:  [5 cmH20-8 cmH20] 8 cmH20   Intake/Output Summary (Last 24 hours) at 07/25/2020  0742 Last data filed at 07/25/2020 0700 Gross per 24 hour  Intake 2939.11 ml  Output 4817 ml  Net -1877.89 ml   Filed Weights   07/23/20 0500 07/24/20 0417 07/25/20 0331  Weight: 129.4 kg 126.9 kg 126.5 kg    Examination:  General: No acute distress, awake and following simple commands, spontaneously moving all extremities HEENT: Moist oral mucosa, endotracheal tube in place Neuro: Following simple commands, moving all 4 extremities CV: Regular rate and rhythm, no murmurs rubs or gallops PULM: Clear to auscultation bilaterally GI: Soft, bowel sounds present, nontender to palpation Extremities: Skin is warm and dry, no edema Skin: no rashes or lesions  Labs/imaging that I havepersonally reviewed    4/28 ECHO Left ventricular EF  60 to 65%. nromal LV and RV function. Perforated left coronary cusp with severe aortic regurgitation. Large  36 x 47m mobile vegetation on ventricular surface of left coronary cusp.  The aortic valve is abnormal. Aortic valve regurgitation is severe.   4/30 MRI:  IMPRESSION: Dozens of primarily punctate acute infarctions scattered throughout the cerebellum and both cerebral hemispheres consistent with micro embolic infarctions from the heart or ascending aorta. Several are associated with petechial blood products as outlined above. Septic emboli not excluded given the clinical history. 5/2 Carotid [place] no evidence of thrombus, dissection, plaque or stenosis in right carotid. MRA pending.  Resolved Hospital Problem list   Hyponatremia, septic shock   Assessment & Plan:   89yoM who presented with AMS found to  be in septic shock secondary to serratia bacteremia, requiring pressor support. MRI brain demonstrated multiple infarcts likely in the setting of septic emboli. He required intubation for TEE which confirmed endocarditis with severe AI. He has been difficult to wean from propofol and ventilatory support.  Acute respiratory failure Patient was initially  intubated for TEE.  Plan for extubation in 1-2 days, once he is euvolemic. He is mentating well, following commands and vent settings at 5/5.   Septic shock secondary to Serratia bacteremia History of IVDU Multiple infarcts on MRI brain Neurology on board, appreciate recommendations. Carotid US unremarkable, left carotid not evaluated due to dialysis access, will order MRA neck. MRA head pending.  -Continue cefepime  Endocarditis with severe AI Continue cefepime.  Infectious disease following, appreciate recommendations.  Appears that previously cardiothoracic surgery deemed the patient not a surgical candidate due to multiorgan failure.   Acute kidney injury Likely ischemic ATN in the setting of septic shock and septic emboli.  Requiring CRRT since 4/28, appreciate nephrology recommendations.  Per nephrology, likely can transition to inpatient HD this week.  Patient is still not producing any urine. Need patient to be euvolemic prior to extubation. -Bladder scan per shift  Metabolic encephalopathy Significantly improved.  Patient is awake and able to follow commands today.  Anemia Thrombocytopenia Stable.   -Continue to trend CBC  Polysubstance abuse -Suboxone, amphetamines -Cessation education when appropriate    Best practice   Diet:  Tube Feed  Pain/Anxiety/Delirium protocol (if indicated): No VAP protocol (if indicated): Yes DVT prophylaxis: SCD GI prophylaxis: PPI Glucose control:  SSI No Central venous access:  N/A Arterial line:  N/A Foley:  N/A Mobility:  bed rest  PT consulted: N/A Last date of multidisciplinary goals of care discussion: mother updated at bedside 5/2 Code Status:  full code Disposition: ICU   Godfrey Tritschler, DO PGY-2 IMTS

## 2020-07-26 ENCOUNTER — Inpatient Hospital Stay (HOSPITAL_COMMUNITY): Payer: 59

## 2020-07-26 DIAGNOSIS — R34 Anuria and oliguria: Secondary | ICD-10-CM | POA: Diagnosis not present

## 2020-07-26 DIAGNOSIS — Z9911 Dependence on respirator [ventilator] status: Secondary | ICD-10-CM | POA: Diagnosis not present

## 2020-07-26 DIAGNOSIS — N179 Acute kidney failure, unspecified: Secondary | ICD-10-CM | POA: Diagnosis not present

## 2020-07-26 DIAGNOSIS — I351 Nonrheumatic aortic (valve) insufficiency: Secondary | ICD-10-CM

## 2020-07-26 DIAGNOSIS — J9601 Acute respiratory failure with hypoxia: Secondary | ICD-10-CM | POA: Diagnosis not present

## 2020-07-26 DIAGNOSIS — I358 Other nonrheumatic aortic valve disorders: Secondary | ICD-10-CM | POA: Diagnosis not present

## 2020-07-26 DIAGNOSIS — A419 Sepsis, unspecified organism: Secondary | ICD-10-CM | POA: Diagnosis not present

## 2020-07-26 LAB — GLUCOSE, CAPILLARY
Glucose-Capillary: 102 mg/dL — ABNORMAL HIGH (ref 70–99)
Glucose-Capillary: 117 mg/dL — ABNORMAL HIGH (ref 70–99)
Glucose-Capillary: 120 mg/dL — ABNORMAL HIGH (ref 70–99)
Glucose-Capillary: 122 mg/dL — ABNORMAL HIGH (ref 70–99)
Glucose-Capillary: 79 mg/dL (ref 70–99)
Glucose-Capillary: 83 mg/dL (ref 70–99)
Glucose-Capillary: 92 mg/dL (ref 70–99)

## 2020-07-26 LAB — RENAL FUNCTION PANEL
Albumin: 1.8 g/dL — ABNORMAL LOW (ref 3.5–5.0)
Albumin: 1.9 g/dL — ABNORMAL LOW (ref 3.5–5.0)
Anion gap: 8 (ref 5–15)
Anion gap: 10 (ref 5–15)
BUN: 65 mg/dL — ABNORMAL HIGH (ref 6–20)
BUN: 69 mg/dL — ABNORMAL HIGH (ref 6–20)
CO2: 26 mmol/L (ref 22–32)
CO2: 23 mmol/L (ref 22–32)
Calcium: 8.4 mg/dL — ABNORMAL LOW (ref 8.9–10.3)
Calcium: 8.5 mg/dL — ABNORMAL LOW (ref 8.9–10.3)
Chloride: 99 mmol/L (ref 98–111)
Chloride: 99 mmol/L (ref 98–111)
Creatinine, Ser: 2.68 mg/dL — ABNORMAL HIGH (ref 0.61–1.24)
Creatinine, Ser: 2.85 mg/dL — ABNORMAL HIGH (ref 0.61–1.24)
GFR, Estimated: 30 mL/min — ABNORMAL LOW (ref >60)
GFR, Estimated: 28 mL/min — ABNORMAL LOW (ref >60)
Glucose, Bld: 120 mg/dL — ABNORMAL HIGH (ref 70–99)
Glucose, Bld: 82 mg/dL (ref 70–99)
Phosphorus: 4.8 mg/dL — ABNORMAL HIGH (ref 2.5–4.6)
Phosphorus: 5.8 mg/dL — ABNORMAL HIGH (ref 2.5–4.6)
Potassium: 5.1 mmol/L (ref 3.5–5.1)
Potassium: 5.6 mmol/L — ABNORMAL HIGH (ref 3.5–5.1)
Sodium: 133 mmol/L — ABNORMAL LOW (ref 135–145)
Sodium: 132 mmol/L — ABNORMAL LOW (ref 135–145)

## 2020-07-26 LAB — COOXEMETRY PANEL
Carboxyhemoglobin: 0.9 % (ref 0.5–1.5)
Methemoglobin: 0.9 % (ref 0.0–1.5)
O2 Saturation: 60 %
Total hemoglobin: 8.1 g/dL — ABNORMAL LOW (ref 12.0–16.0)

## 2020-07-26 LAB — CBC
HCT: 25.2 % — ABNORMAL LOW (ref 39.0–52.0)
Hemoglobin: 7.9 g/dL — ABNORMAL LOW (ref 13.0–17.0)
MCH: 27.4 pg (ref 26.0–34.0)
MCHC: 31.3 g/dL (ref 30.0–36.0)
MCV: 87.5 fL (ref 80.0–100.0)
Platelets: 129 10*3/uL — ABNORMAL LOW (ref 150–400)
RBC: 2.88 MIL/uL — ABNORMAL LOW (ref 4.22–5.81)
RDW: 17.7 % — ABNORMAL HIGH (ref 11.5–15.5)
WBC: 17.1 10*3/uL — ABNORMAL HIGH (ref 4.0–10.5)
nRBC: 0 % (ref 0.0–0.2)

## 2020-07-26 LAB — BRAIN NATRIURETIC PEPTIDE: B Natriuretic Peptide: 76 pg/mL (ref 0.0–100.0)

## 2020-07-26 LAB — MAGNESIUM: Magnesium: 3.1 mg/dL — ABNORMAL HIGH (ref 1.7–2.4)

## 2020-07-26 LAB — LDL CHOLESTEROL, DIRECT: Direct LDL: 57.1 mg/dL (ref 0–99)

## 2020-07-26 MED ORDER — SODIUM CHLORIDE 0.9 % IV SOLN: INTRAVENOUS | Status: DC

## 2020-07-26 MED ORDER — DEXTROSE 50 % IV SOLN
INTRAVENOUS | Status: AC
  Filled 2020-07-26: qty 50

## 2020-07-26 MED ORDER — HEPARIN SODIUM (PORCINE) 5000 UNIT/ML IJ SOLN
5000.0000 [IU] | Freq: Three times a day (TID) | INTRAMUSCULAR | Status: DC
  Administered 2020-07-26 – 2020-08-02 (×21): 5000 [IU] via SUBCUTANEOUS
  Filled 2020-07-26 (×21): qty 1

## 2020-07-26 MED ORDER — PRISMASOL BGK 0/2.5 32-2.5 MEQ/L EC SOLN
Status: DC
  Filled 2020-07-26 (×3): qty 5000

## 2020-07-26 MED ORDER — PROPOFOL 1000 MG/100ML IV EMUL
5.0000 ug/kg/min | INTRAVENOUS | Status: DC
  Administered 2020-07-26: 40 ug/kg/min via INTRAVENOUS
  Administered 2020-07-26 (×2): 20 ug/kg/min via INTRAVENOUS
  Administered 2020-07-27 (×2): 50 ug/kg/min via INTRAVENOUS
  Administered 2020-07-27: 40 ug/kg/min via INTRAVENOUS
  Filled 2020-07-26 (×6): qty 100

## 2020-07-26 NOTE — Progress Notes (Signed)
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 MRA head and neck unremarkable, [place] by Neurology and CT SX. Planned to get TEE today given [place] noted.   Assessment Serratia marcescens native AV Endocarditis complicated with multiple embolic infarctions in cerebellum and both cerebral hemisphere.  MRA head and neck ( motion degraded exam) did not show large vessel obstruction of intracranial aneurysms   Severe AVR with perforated aortic cusp Acute respiratory failure s/p intubation  Acute Renal Failure on RRT AKI Hepatosplenomegaly  Thrombocytopenia  Recommendations Continue cefepime as is Fu TEE results  CT surgery following - recommending dental clearacce prior to OR Monitor CBC and BMP on IV antibiotics  Rest of the management as per the primary team. Thank you for the consult. Please page with pertinent questions or concerns.  ______________________________________________________________________ Subjective patient seen and examined at the bedside. Intubated and following commands. Denies any discomfort or pain    Vitals BP (!) 132/51   Pulse 91   Temp 99.2 F (37.3 C) (Axillary)   Resp 18   Ht '6\' 4"'$  (1.93 m)   Wt 120.5 kg   SpO2 100%   BMI 32.34 kg/m     Physical  Exam Constitutional:  Intubated     Comments:   Cardiovascular:     Rate and Rhythm: Normal rate and regular rhythm.     Heart sounds:   Pulmonary:     Effort: Loud vent sounds    Comments:   Abdominal:     Palpations: Abdomen is soft.     Tenderness: Nontender   Musculoskeletal:        General: No swelling or tenderness.   Skin:    Comments: No peripheral stigmata of endocarditis   Neurological:     General:   Psychiatric:        Mood and Affect: Mood normal.     Pertinent Microbiology Results for orders placed or performed during the hospital encounter of 07/18/20  Resp Panel by RT-PCR (Flu A&B, Covid) Nasopharyngeal Swab     Status: None   Collection Time: 07/18/20  3:20 AM   Specimen: Nasopharyngeal Swab; Nasopharyngeal(NP) swabs in vial transport medium  Result Value Ref Range Status   SARS Coronavirus 2 by RT PCR NEGATIVE NEGATIVE Final    Comment: (NOTE) SARS-CoV-2 target nucleic acids are NOT DETECTED.  The SARS-CoV-2 RNA is generally detectable in upper respiratory specimens during the acute phase of infection. The lowest concentration of SARS-CoV-2 viral copies this assay can detect is 138 copies/mL. A negative result does not preclude SARS-Cov-2 infection and should not be used as the sole basis for treatment or other patient management decisions. A negative result may occur with  improper specimen collection/handling, submission of specimen other than nasopharyngeal swab, presence of viral mutation(s) within the  areas targeted by this assay, and inadequate number of viral copies(<138 copies/mL). A negative result must be combined with clinical observations, patient history, and epidemiological information. The expected result is Negative.  Fact Sheet for Patients:  EntrepreneurPulse.com.au  Fact Sheet for Healthcare Providers:  IncredibleEmployment.be  This test is no t yet approved or cleared by the [place] FDA and  has been authorized for detection and/or diagnosis of SARS-CoV-2 by FDA under an Emergency Use Authorization (EUA). This EUA will remain  in effect (meaning this test can be used) for the duration of the COVID-19 declaration under Section 564(b)(1) of the Act, 21 U.S.C.section 360bbb-3(b)(1), unless the authorization is terminated  or revoked sooner.       Influenza A by PCR NEGATIVE NEGATIVE Final   Influenza B by PCR NEGATIVE NEGATIVE Final    Comment: (NOTE) The Xpert Xpress SARS-CoV-2/FLU/RSV plus assay is intended as an aid in the diagnosis of influenza from Nasopharyngeal swab specimens and should not be used as a sole basis for treatment. Nasal washings and aspirates are unacceptable for Xpert Xpress SARS-CoV-2/FLU/RSV testing.  Fact Sheet for Patients: EntrepreneurPulse.com.au  Fact Sheet for Healthcare Providers: IncredibleEmployment.be  This test is not yet approved or cleared by the [place] FDA and has been authorized for detection and/or diagnosis of SARS-CoV-2 by FDA under an Emergency Use Authorization (EUA). This EUA will remain in effect (meaning this test can be used) for the duration of the COVID-19 declaration under Section 564(b)(1) of the Act, 21 U.S.C. section 360bbb-3(b)(1), unless the authorization is terminated or revoked.  Performed at [facility], [address], [place], [place] [address]   Culture, blood (single)     Status: Abnormal   Collection Time: 07/18/20  3:30 AM   Specimen: BLOOD RIGHT FOREARM  Result Value Ref Range Status   Specimen Description   Final    BLOOD RIGHT FOREARM Performed at [facility], [address] [place], [place] [address]    Special Requests   Final    BOTTLES DRAWN AEROBIC AND ANAEROBIC Blood Culture adequate volume Performed at [facility], [address], [place], [place] [address]    Culture  Setup Time   Final    GRAM NEGATIVE RODS IN BOTH AEROBIC  AND ANAEROBIC BOTTLES Gram Stain Report Called to,Read Back By and Verified With: PITTS,R AT [facility] ICU AT [id number] ON 07/18/20 BY HUFFINES,S CRITICAL RESULT CALLED TO, READ BACK BY AND VERIFIED WITH: Denton Brick [facility] 07/19/20 0451 JDW Performed at [facility], [address] [place], [place] [address]    Culture SERRATIA MARCESCENS (A)  Final   Report Status 07/21/2020 FINAL  Final   Organism ID, Bacteria SERRATIA MARCESCENS  Final      Susceptibility   Serratia marcescens - MIC*    CEFAZOLIN >=64 RESISTANT Resistant     CEFEPIME <=0.12 SENSITIVE Sensitive     CEFTAZIDIME <=1 SENSITIVE Sensitive     CEFTRIAXONE <=0.25 SENSITIVE Sensitive     CIPROFLOXACIN <=0.25 SENSITIVE Sensitive     GENTAMICIN <=1 SENSITIVE Sensitive     TRIMETH/SULFA <=20 SENSITIVE Sensitive     * SERRATIA MARCESCENS  Blood Culture ID Panel (Reflexed)     Status: Abnormal   Collection Time: 07/18/20  3:30 AM  Result Value Ref Range Status   Enterococcus faecalis NOT DETECTED NOT DETECTED Final   Enterococcus Faecium NOT DETECTED NOT DETECTED Final   Listeria monocytogenes NOT DETECTED NOT DETECTED Final   Staphylococcus species NOT DETECTED NOT DETECTED  Final   Staphylococcus aureus (BCID) NOT DETECTED NOT DETECTED Final   Staphylococcus epidermidis NOT DETECTED NOT DETECTED Final   Staphylococcus lugdunensis NOT DETECTED NOT DETECTED Final   Streptococcus species NOT DETECTED NOT DETECTED Final   Streptococcus agalactiae NOT DETECTED NOT DETECTED Final   Streptococcus pneumoniae NOT DETECTED NOT DETECTED Final   Streptococcus pyogenes NOT DETECTED NOT DETECTED Final   A.calcoaceticus-baumannii NOT DETECTED NOT DETECTED Final   Bacteroides fragilis NOT DETECTED NOT DETECTED Final   Enterobacterales DETECTED (A) NOT DETECTED Final    Comment: Enterobacterales represent a large order of gram negative bacteria, not a single organism. CRITICAL RESULT CALLED TO, READ BACK BY AND VERIFIED WITH: G ABBOTT PHARMD  07/19/20 0451 JDW    Enterobacter cloacae complex NOT DETECTED NOT DETECTED Final   Escherichia coli NOT DETECTED NOT DETECTED Final   Klebsiella aerogenes NOT DETECTED NOT DETECTED Final   Klebsiella oxytoca NOT DETECTED NOT DETECTED Final   Klebsiella pneumoniae NOT DETECTED NOT DETECTED Final   Proteus species NOT DETECTED NOT DETECTED Final   Salmonella species NOT DETECTED NOT DETECTED Final   Serratia marcescens DETECTED (A) NOT DETECTED Final    Comment: CRITICAL RESULT CALLED TO, READ BACK BY AND VERIFIED WITH: G ABBOTT PHARMD 07/19/20 0451 JDW    Haemophilus influenzae NOT DETECTED NOT DETECTED Final   Neisseria meningitidis NOT DETECTED NOT DETECTED Final   Pseudomonas aeruginosa NOT DETECTED NOT DETECTED Final   Stenotrophomonas maltophilia NOT DETECTED NOT DETECTED Final   Candida albicans NOT DETECTED NOT DETECTED Final   Candida auris NOT DETECTED NOT DETECTED Final   Candida glabrata NOT DETECTED NOT DETECTED Final   Candida krusei NOT DETECTED NOT DETECTED Final   Candida parapsilosis NOT DETECTED NOT DETECTED Final   Candida tropicalis NOT DETECTED NOT DETECTED Final   Cryptococcus neoformans/gattii NOT DETECTED NOT DETECTED Final   CTX-M ESBL NOT DETECTED NOT DETECTED Final   Carbapenem resistance IMP NOT DETECTED NOT DETECTED Final   Carbapenem resistance KPC NOT DETECTED NOT DETECTED Final   Carbapenem resistance NDM NOT DETECTED NOT DETECTED Final   Carbapenem resist OXA 48 LIKE NOT DETECTED NOT DETECTED Final   Carbapenem resistance VIM NOT DETECTED NOT DETECTED Final    Comment: Performed at [facility], [address] [place], [place] [address]  Urine culture     Status: Abnormal   Collection Time: 07/18/20  7:02 AM   Specimen: Urine, Catheterized  Result Value Ref Range Status   Specimen Description   Final    URINE, CATHETERIZED Performed at [facility], [address], [place], [place] [address]    Special Requests   Final    NONE Performed at  [facility], [address] [place], [place] [address]    Culture (A)  Final    <10,000 COLONIES/mL INSIGNIFICANT GROWTH Performed at [facility] [address], [place], [place] [address]    Report Status 07/19/2020 FINAL  Final  Culture, blood (routine x 2)     Status: None   Collection Time: 07/20/20  2:20 PM   Specimen: BLOOD RIGHT HAND  Result Value Ref Range Status   Specimen Description BLOOD RIGHT HAND  Final   Special Requests   Final    BOTTLES DRAWN AEROBIC AND ANAEROBIC Blood Culture adequate volume   Culture   Final    NO GROWTH 5 DAYS Performed at [facility], [place] [address], [place], [place] [address]    Report Status 07/25/2020 FINAL  Final  Culture, blood (routine x 2)     Status: None   Collection Time: 07/20/20  2:25 PM   Specimen: BLOOD RIGHT HAND  Result Value Ref Range Status   Specimen Description BLOOD RIGHT HAND  Final   Special Requests   Final    BOTTLES DRAWN AEROBIC ONLY Blood Culture results may not be optimal due to an inadequate volume of blood received in culture bottles   Culture   Final    NO GROWTH 5 DAYS Performed at [facility], [place] [address] [place], [place] [address]    Report Status 07/25/2020 FINAL  Final  MRSA PCR Screening     Status: None   Collection Time: 07/21/20  8:18 AM   Specimen: Nasal Mucosa; Nasopharyngeal  Result Value Ref Range Status   MRSA by PCR NEGATIVE NEGATIVE Final    Comment:        The GeneXpert MRSA Assay (FDA approved for NASAL specimens only), is one component of a comprehensive MRSA colonization surveillance program. It is not intended to diagnose MRSA infection nor to guide or monitor treatment for MRSA infections. Performed at [facility], [place] [address], [place], [place] [address]     Pertinent Lab. CBC Latest Ref Rng & Units 07/26/2020 07/25/2020 07/24/2020  WBC 4.0 - 10.5 K/uL 17.1(H) 22.0(H) 18.2(H)  Hemoglobin 13.0 - 17.0 g/dL 7.9(L) 8.6(L) 8.5(L)  Hematocrit  39.0 - 52.0 % 25.2(L) 26.9(L) 27.6(L)  Platelets 150 - 400 K/uL 129(L) PLATELET CLUMPS NOTED ON SMEAR, UNABLE TO ESTIMATE 63(L)   CMP Latest Ref Rng & Units 07/26/2020 07/25/2020 07/25/2020  Glucose 70 - 99 mg/dL 120(H) 95 124(H)  BUN 6 - 20 mg/dL 65(H) 69(H) 54(H)  Creatinine 0.61 - 1.24 mg/dL 2.68(H) 3.14(H) 2.54(H)  Sodium 135 - 145 mmol/L 133(L) 131(L) 133(L)  Potassium 3.5 - 5.1 mmol/L 5.1 5.6(H) 5.0  Chloride 98 - 111 mmol/L 99 98 101  CO2 22 - 32 mmol/L '26 24 24  '$ Calcium 8.9 - 10.3 mg/dL 8.4(L) 8.5(L) 8.2(L)  Total Protein 6.5 - 8.1 g/dL - - -  Total Bilirubin 0.3 - 1.2 mg/dL - - -  Alkaline Phos 38 - 126 U/L - - -  AST 15 - 41 U/L - - -  ALT 0 - 44 U/L - - -     Pertinent Imaging today Plain films and CT images have been personally visualized and interpreted; radiology reports have been reviewed. Decision making incorporated into the Impression / Recommendations.  MRA head 07/25/20 IMPRESSION: No evidence of intracranial proximal large vessel occlusion.  Significant motion degradation, precluding adequate evaluation for intracranial arterial stenosis.  Apparent moderate stenosis within the right posterior cerebral artery at the P1/P2 junction.    MRA neck 07/25/20 IMPRESSION: The examination is moderately motion degraded, limiting evaluation. Additionally, the origins of the left common carotid and left vertebral arteries are excluded from the field of view.  The visible common carotid and internal carotid arteries are patent within the neck without appreciable hemodynamically significant stenosis.  The visible vertebral arteries are patent within the neck with antegrade flow. Apparent stenoses within the non-dominant right vertebral artery at the vessel origin (moderate/severe), and at the V1/V2 junction (moderate). However, these apparent stenoses could potentially be exaggerated by motion artifact and non-contrast technique.   I have spent more than 35  minutes for this patient encounter including review of prior medical records, coordination of care  with greater than 50% of time being face to face/counseling  and discussing diagnostics/treatment plan with the patient/family.  Electronically signed by:   Rosiland Oz, MD Infectious Disease Physician [facility] Pager: [phone]

## 2020-07-26 NOTE — Progress Notes (Signed)
NAME:  Alex [name], MRN:  [id number], DOB:  Aug 03, 1980, LOS: 8 ADMISSION DATE:  07/18/2020, CONSULTATION DATE:  4/26 REFERRING MD:  Dr. Florina Ou, CHIEF COMPLAINT:  AMS   History of Present Illness:  40 yo male smoker brought to [facility] ER with altered mental status.  Noted to have headache and feeling feverish before admission.  Found to have hypotension, AKI, thrombocytopenia, splenomegaly, lactic acidosis, elevated bilirubin, hypoglycemia.  Started on antibiotics and pressors for possible sepsis, but no clear source of infection.  Normal renal function from February 2022.  Uses suboxone for history of substance abuse.  UDS positive for amphetamines.  Transfer to [facility] for further therapy.  Hx from chart, medical team, and pt's mother.  Pertinent  Medical History  Polysubstance Abuse - amphetamines, heroin. On Suboxone.   Ruptured Disc  Significant Hospital Events: Including procedures, antibiotic start and stop dates in addition to other pertinent events   . 4/26 transfer from [facility] to [facility] . CT head 4/26 >> no acute findings . CT abd/pelvis 4/26 >> cholelithiasis w/o inflammatory chagnes, mild hepatomegaly (25 cm), moderate splenomegaly (16.7 cm), 2 mm non obstructing kidney stone on Lt . 4/27 CVL placed. Pressors escalating.  . 4/28 intubated for TEE, CVC, HD, and A-line placed. CRRT started  . Remains on CRRT . MRI on 4/30-multiple infarcts  Interim History / Subjective:   No acute overnight events. Patient continues to be awake and alert on exam. Denies any pain.  Objective   Blood pressure 137/61, pulse 88, temperature 99.2 F (37.3 C), temperature source Axillary, resp. rate 16, height '6\' 4"'$  (1.93 m), weight 120.5 kg, SpO2 100 %.    Vent Mode: PRVC FiO2 (%):  [40 %] 40 % Set Rate:  [15 bmp] 15 bmp Vt Set:  [600 mL] 600 mL PEEP:  [5 cmH20] 5 cmH20 Pressure Support:  [5 cmH20] 5 cmH20 Plateau Pressure:  [13 cmH20-17 cmH20] 15 cmH20   Intake/Output Summary (Last 24 hours) at  07/26/2020 0750 Last data filed at 07/26/2020 0700 Gross per 24 hour  Intake 2135.46 ml  Output 3940 ml  Net -1804.54 ml   Filed Weights   07/24/20 0417 07/25/20 0331 07/26/20 0500  Weight: 126.9 kg 126.5 kg 120.5 kg    Examination:  General: No acute distress, awake and following simple commands, spontaneously moving all extremities HEENT: Moist oral mucosa, endotracheal tube in place Neuro: Following simple commands, moving all 4 extremities CV: Regular rate and rhythm, no murmurs rubs or gallops PULM: Clear to auscultation bilaterally GI: Soft, bowel sounds present, nontender to palpation Extremities: Skin is warm and dry, significant generalized edema Skin: no rashes or lesions  Labs/imaging that I havepersonally reviewed    4/28 ECHO Left ventricular EF  60 to 65%. nromal LV and RV function. Perforated left coronary cusp with severe aortic regurgitation. Large  36 x 33m mobile vegetation on ventricular surface of left coronary cusp.  The aortic valve is abnormal. Aortic valve regurgitation is severe.   4/30 MRI: IMPRESSION: Dozens of primarily punctate acute infarctions scattered throughout the cerebellum and both cerebral hemispheres consistent with micro embolic infarctions from the heart or ascending aorta. Several are associated with petechial blood products as outlined above. Septic emboli not excluded given the clinical history.  5/3: RLE doppler negative for DVT   Resolved Hospital Problem list   Hyponatremia, septic shock   Assessment & Plan:   89yoM who presented with AMS found to be in septic shock secondary to serratia bacteremia,  requiring pressor support. MRI brain demonstrated multiple infarcts likely in the setting of septic emboli. He required intubation for TEE which confirmed endocarditis with severe AI. He has been difficult to wean from propofol and ventilatory support.  Septic shock secondary to Serratia bacteremia History of IVDU Multiple infarcts on  MRI brain Overall improved, no longer requiring pressor support. MRA head and neck does not demonstrate mycotic aneurysms. Cardiothoracic surgery following for possible valve repair. Neurology has no further recommendations, appreciate input. -Continue cefepime  Acute respiratory failure Patient was initially intubated for TEE.  Significantly improved, mentating well and able to follow simple commands. Optimizing patient's volume status prior to extubation, likely today.  Endocarditis with severe AI Continue cefepime.  Infectious disease following, appreciate recommendations. CTS following for possible valve repair, will need neuro and dental clearance. Likely in 1-2 weeks.  Acute kidney injury Likely ischemic ATN in the setting of septic shock and septic emboli.  Requiring CRRT since 4/28, appreciate nephrology recommendations.  Per nephrology, likely can transition to inpatient HD this week.  Patient is still not producing any urine. -Bladder scan per shift  Metabolic encephalopathy Significantly improved.  Patient is awake and able to follow commands today.  Anemia Thrombocytopenia Improving. Hemoglobin 7.9, stable. Platelets 129.   -Continue to trend CBC  Polysubstance abuse -Suboxone, amphetamines -Cessation education when appropriate    Best practice   Diet:  Tube Feed  Pain/Anxiety/Delirium protocol (if indicated): No VAP protocol (if indicated): Yes DVT prophylaxis: SCD GI prophylaxis: PPI Glucose control:  SSI No Central venous access:  N/A Arterial line:  N/A Foley:  N/A Mobility:  bed rest  PT consulted: N/A Last date of multidisciplinary goals of care discussion: mother updated at bedside 5/3 Code Status:  full code Disposition: ICU   Idelle Reimann, DO PGY-2 IMTS

## 2020-07-26 NOTE — Procedures (Signed)
    TRANSESOPHAGEAL ECHOCARDIOGRAM   NAME:  Alex [name]    MRN: [id number] DOB:  [birth year]    ADMIT DATE: 07/18/2020  INDICATIONS: Aortic Valve Endocarditis.  PROCEDURE:   Informed consent was obtained prior to the procedure. The risks, benefits and alternatives for the procedure were discussed and the patient comprehended these risks.  Risks include, but are not limited to, cough, sore throat, vomiting, nausea, somnolence, esophageal and stomach trauma or perforation, bleeding, low blood pressure, aspiration, pneumonia, infection, trauma to the teeth and death.    Procedural time out performed.   Anesthesia was administered in conjunction with ICU intensivist Dr. Carlis Abbott.  The patient was administered a total of Versed 2 mg and Fentanyl 100 mcg to achieve and maintain sedation (patient had received propofol of 80 mg and levophed of 16 mcg as part of ICU care- was on fentanyl prior to procedure).  The patient's heart rate, blood pressure, and oxygen saturation are monitored continuously during the procedure. The period of conscious sedation is 30 minutes, of which I was present face-to-face 100% of this time.   The transesophageal probe was inserted in the esophagus and stomach without difficulty and multiple views were obtained.   COMPLICATIONS:    There were no immediate complications.  KEY FINDINGS:  1. 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  Reached out to CT Surgery and Critical care ICU MD.  Discussed with patient's mother. 2. Full report to follow. 3. Further management per primary team.   Rudean Haskell, MD [place]  7:26 PM

## 2020-07-26 NOTE — Progress Notes (Signed)
Patient ID: Alex [name], male   DOB: February 02, 1981, 40 y.o.   MRN: [id number] TEE has been performed.

## 2020-07-26 NOTE — Progress Notes (Signed)
PT Cancellation Note  Patient Details Name: Alex [name] MRN: [id number] DOB: [birth year]   Cancelled Treatment:    Reason Eval/Treat Not Completed: Patient not medically ready   Per RN, goal is to extubate this morning. Per RT, pt will extubate to BiPAP and need several hours after extubation to see that he stabilizes. Will plan to attempt PT evaluation 07/27/20.   Alex [name], PT Pager [phone]    [name] 07/26/2020, 9:34 AM

## 2020-07-26 NOTE — Plan of Care (Signed)
    [facility] has been requested to perform a transesophageal echocardiogram on Alex [name] for aortic valve abscess rule out.  After careful review of history and examination, the risks and benefits of transesophageal echocardiogram have been explained including risks of esophageal damage, perforation (1:10,000 risk), bleeding, pharyngeal hematoma as well as other potential complications associated with conscious sedation including aspiration, arrhythmia, respiratory failure and death. Alternatives to treatment were discussed, questions were answered. Patient's mother is willing to proceed. (Patient is intubated and sedated).  Werner Lean, MD  07/26/2020 2:16 PM

## 2020-07-26 NOTE — Progress Notes (Signed)
     [address]       [place] [address]             [phone]       Some bigemini today.   Tolerating vent wean  Vitals:   07/26/20 0848 07/26/20 0900  BP: (!) 108/51 135/60  Pulse: 89 91  Resp: (!) 26 20  Temp:    SpO2: 100% 99%   arousable Vent Mode: PSV;CPAP FiO2 (%):  [30 %-40 %] 30 % Set Rate:  [15 bmp] 15 bmp Vt Set:  [600 mL] 600 mL PEEP:  [5 cmH20] 5 cmH20 Pressure Support:  [5 cmH20] 5 cmH20 Plateau Pressure:  [13 cmH20-17 cmH20] 15 cmH20   MRA.  Poor quality study due to motion.  No obvious intracranial aneurysms  40 yo male with Serratia bacteremia, aortic valve endocarditis with severe AI.  He had developed bigemini over the last 24hrs which is concerning for annular involvement.   - Agree with repeat TEE for assessment prior to extubation. - will need dental clearance prior to surgery  Lajuana Matte

## 2020-07-26 NOTE — Progress Notes (Signed)
Dr. Augustin Coupe with Nephrology notified about potassium of 5.6. waiting for orders.

## 2020-07-26 NOTE — Progress Notes (Signed)
Alex [name] NEPHROLOGY PROGRESS NOTE  Assessment/ Plan: Pt is a 40 y.o. yo male with history of substance abuse, ruptured disc who was initially presented to ER with altered mental status found to be septic shock and Serratia bacteremia/AV endocarditis seen as a consultation for AKI requiring CRRT.  #Anuric acute kidney injury likely ischemic ATN in the setting of septic shock/septic emboli.  Kidney ultrasound without hydronephrosis. Started CRRT on 4/28-->current.   Potassium level acceptable and tolerating UF goal 50-100 cc/hour, plan to continue current CRRT with UF to facilitate extubation.    #Serratia more concerns bacteremia/AV endocarditis: With history of substance abuse.  Seen by ID, on IV cefepime.  TCTS is following for possible AVR  #Septic shock, multiorgan failure: Off pressors, per CCM, as above.  #Vent dependent respiratory failure managed by PCCM.  #Acute encephalopathy due to sepsis/septic emboli: Monitor mental status.  #Thrombocytopenia and anemia probably because of sepsis and acute illness.  Monitor lab.  Subjective:   Seen and examined in ICU.  Remains anuric.    Not requiring pressors, tol UF 1.8L net neg yesterday  K 5.1, P 4.8.    Needs TEE for TCTS eval  Objective Vital signs in last 24 hours: Vitals:   07/26/20 1106 07/26/20 1155 07/26/20 1200 07/26/20 1230  BP:  (!) 124/52 137/63 (!) 134/59  Pulse:  86 89 90  Resp:  (!) $Re'25 17 16  'SUE$ Temp: 98.3 F (36.8 C)     TempSrc: Axillary     SpO2:  99% 100% 100%  Weight:      Height:       Weight change: -6 kg  Intake/Output Summary (Last 24 hours) at 07/26/2020 1242 Last data filed at 07/26/2020 1200 Gross per 24 hour  Intake 2163.95 ml  Output 4099 ml  Net -1935.05 ml     Labs: Basic Metabolic Panel: Recent Labs  Lab 07/25/20 0333 07/25/20 1542 07/26/20 0331  NA 133* 131* 133*  K 5.0 5.6* 5.1  CL 101 98 99  CO2 $Re'24 24 26  'ImP$ GLUCOSE 124* 95 120*  BUN 54* 69* 65*  CREATININE  2.54* 3.14* 2.68*  CALCIUM 8.2* 8.5* 8.4*  PHOS 4.3 5.7* 4.8*   Liver Function Tests: Recent Labs  Lab 07/20/20 0246 07/20/20 2032 07/25/20 0333 07/25/20 1542 07/26/20 0331  AST 44*  --   --   --   --   ALT 21  --   --   --   --   ALKPHOS 179*  --   --   --   --   BILITOT 8.1*  --   --   --   --   PROT 6.0*  --   --   --   --   ALBUMIN 1.6*   < > 1.7* 1.9* 1.8*   < > = values in this interval not displayed.   No results for input(s): LIPASE, AMYLASE in the last 168 hours. No results for input(s): AMMONIA in the last 168 hours. CBC: Recent Labs  Lab 07/21/20 0702 07/21/20 1600 07/22/20 0407 07/23/20 0346 07/24/20 0900 07/25/20 0333 07/26/20 0331  WBC 15.4*   < > 24.2* 17.3* 18.2* 22.0* 17.1*  NEUTROABS 11.2*  --   --   --   --   --   --   HGB 9.4*   < > 8.9* 7.5* 8.5* 8.6* 7.9*  HCT 30.2*   < > 28.3* 23.8* 27.6* 26.9* 25.2*  MCV 87.0   < > 86.5 86.9 87.9  86.2 87.5  PLT 14*   < > 21* 37* 63* PLATELET CLUMPS NOTED ON SMEAR, UNABLE TO ESTIMATE 129*   < > = values in this interval not displayed.   Cardiac Enzymes: No results for input(s): CKTOTAL, CKMB, CKMBINDEX, TROPONINI in the last 168 hours. CBG: Recent Labs  Lab 07/25/20 1957 07/25/20 2332 07/26/20 0327 07/26/20 0808 07/26/20 1105  GLUCAP 104* 100* 117* 120* 122*    Iron Studies:  No results for input(s): IRON, TIBC, TRANSFERRIN, FERRITIN in the last 72 hours. Studies/Results: MR ANGIO HEAD WO CONTRAST  Result Date: 07/25/2020 CLINICAL DATA:  Stroke, follow-up. EXAM: MRA HEAD WITHOUT CONTRAST TECHNIQUE: Angiographic images of the Circle of Willis were obtained using MRA technique without intravenous contrast. COMPARISON:  Brain MRI 07/22/2020. FINDINGS: Moderate to moderately severe motion degradation, limiting evaluation. This precludes adequate evaluation for arterial stenoses. This also significantly limits evaluation for intracranial aneurysms. The intracranial internal carotid arteries, middle cerebral  arteries and anterior cerebral arteries are patent without appreciable proximal large vessel occlusion. The vertebral arteries, basilar artery and posterior cerebral arteries are patent without appreciable proximal large vessel occlusion. Apparent moderate stenosis within the right posterior cerebral artery at the P1/P2 junction (series 2, image 83). Posterior communicating arteries are hypoplastic or absent bilaterally. IMPRESSION: No evidence of intracranial proximal large vessel occlusion. Significant motion degradation, precluding adequate evaluation for intracranial arterial stenosis. Apparent moderate stenosis within the right posterior cerebral artery at the P1/P2 junction. Electronically Signed   By: Kellie Simmering DO   On: 07/25/2020 13:19   MR ANGIO NECK WO CONTRAST  Result Date: 07/25/2020 CLINICAL DATA:  Provided history: Septic arterial embolism. EXAM: MRA NECK WITHOUT CONTRAST TECHNIQUE: Multiplanar and multiecho pulse sequences of the neck were obtained without intravenous contrast. Angiographic images of the neck were obtained using MRA technique without intravenous contrast. COMPARISON:  Brain MRI 07/22/2020. FINDINGS: The examination is moderately motion degraded, limiting evaluation. Additionally, the origins of the left common carotid and left vertebral arteries are excluded from the field of view. The visible common carotid and internal carotid arteries are patent without appreciable hemodynamically significant stenosis. Tortuosity of the distal cervical ICAs bilaterally. The visible vertebral arteries are patent within the neck with antegrade flow. Left vertebral artery dominant. Apparent moderate/severe stenosis at the origin of the non dominant right vertebral artery. Additionally, there is an apparent moderate stenosis within the right vertebral artery at the V1/V2 junction. However, these apparent stenoses could potentially be exaggerate by motion artifact and noncontrast technique.  IMPRESSION: The examination is moderately motion degraded, limiting evaluation. Additionally, the origins of the left common carotid and left vertebral arteries are excluded from the field of view. The visible common carotid and internal carotid arteries are patent within the neck without appreciable hemodynamically significant stenosis. The visible vertebral arteries are patent within the neck with antegrade flow. Apparent stenoses within the non-dominant right vertebral artery at the vessel origin (moderate/severe), and at the V1/V2 junction (moderate). However, these apparent stenoses could potentially be exaggerated by motion artifact and non-contrast technique. Electronically Signed   By: Kellie Simmering DO   On: 07/25/2020 13:10   DG Abd Portable 1V  Result Date: 07/25/2020 CLINICAL DATA:  Orogastric tube placement. EXAM: PORTABLE ABDOMEN - 1 VIEW COMPARISON:  July 22, 2020 FINDINGS: Orogastric tube with tip and side port overlying the stomach. Weighted enteric feeding catheter with tip overlying the stomach. Endotracheal tube with tip overlying the distal thoracic trachea. Left approach central venous catheter with tip overlying the  SVC. The bowel gas pattern is normal. IMPRESSION: Orogastric tube with tip and side port overlying the stomach. Electronically Signed   By: Dahlia Bailiff MD   On: 07/25/2020 10:38   VAS US CAROTID  Result Date: 07/26/2020 Carotid Arterial Duplex Study Patient Name:  Alex [name]  Date of Exam:   07/24/2020 Medical Rec #: [id number]          Accession #:    [id number] Date of Birth: 05/06/1980          Patient Gender: M Patient Age:   39Y Exam Location:  [facility] Procedure:      VAS US CAROTID Referring Phys: [id number] Rosalin Hawking --------------------------------------------------------------------------------  Indications:       CVA. Risk Factors:      Current smoker. Other Factors:     Endocarditis, sepsis, IV drug abuse. Limitations        Today's exam was limited  due to Dialysis acces in left neck,                    ventilation. Comparison Study:  No prior study on file Performing Technologist: Sharion Dove RVS  Examination Guidelines: A complete evaluation includes B-mode imaging, spectral Doppler, color Doppler, and power Doppler as needed of all accessible portions of each vessel. Bilateral testing is considered an integral part of a complete examination. Limited examinations for reoccurring indications may be performed as noted.  Right Carotid Findings: +----------+--------+--------+--------+------------------+--------+           PSV cm/sEDV cm/sStenosisPlaque DescriptionComments +----------+--------+--------+--------+------------------+--------+ CCA Prox  252     2                                          +----------+--------+--------+--------+------------------+--------+ CCA Distal271     3                                          +----------+--------+--------+--------+------------------+--------+ ICA Prox  118     19                                         +----------+--------+--------+--------+------------------+--------+ ICA Distal151     25                                         +----------+--------+--------+--------+------------------+--------+ +----------+--------+-------+--------+-------------------+           PSV cm/sEDV cmsDescribeArm Pressure (mmHG) +----------+--------+-------+--------+-------------------+ Subclavian101                                        +----------+--------+-------+--------+-------------------+ +---------+--------+---+--------+--+ VertebralPSV cm/s112EDV cm/s11 +---------+--------+---+--------+--+  Left Carotid Findings: +----------+--------+--------+--------+-------------------+           PSV cm/sEDV cm/sDescribeArm Pressure (mmHG) +----------+--------+--------+--------+-------------------+ [id number]                                          +----------+--------+--------+--------+-------------------+   Summary: Right Carotid: There was no  evidence of thrombus, dissection, atherosclerotic                plaque or stenosis in the cervical carotid system. Left Carotid: Unable to insonate secondary to Dialysis access in left jugular. Vertebrals:  Right vertebral artery demonstrates antegrade flow. Subclavians: Normal flow hemodynamics were seen in bilateral subclavian              arteries. *See table(s) above for measurements and observations.  Electronically signed by Antony Contras MD on 07/26/2020 at 8:29:22 AM.    Final    VAS [place] LOWER EXTREMITY VENOUS (DVT)  Result Date: 07/25/2020  Lower Venous DVT Study Patient Name:  Alex [name]  Date of Exam:   07/25/2020 Medical Rec #: [id number]          Accession #:    [id number] Date of Birth: 11/07/1980          Patient Gender: M Patient Age:   39Y Exam Location:  [facility] Procedure:      VAS [place] LOWER EXTREMITY VENOUS (DVT) Referring Phys: [id number] [place] --------------------------------------------------------------------------------  Indications: Edema.  Comparison Study: no prior Performing Technologist: Abram Sander RVS  Examination Guidelines: A complete evaluation includes B-mode imaging, spectral Doppler, color Doppler, and power Doppler as needed of all accessible portions of each vessel. Bilateral testing is considered an integral part of a complete examination. Limited examinations for reoccurring indications may be performed as noted. The reflux portion of the exam is performed with the patient in reverse Trendelenburg.  +---------+---------------+---------+-----------+----------+--------------+ RIGHT    CompressibilityPhasicitySpontaneityPropertiesThrombus Aging +---------+---------------+---------+-----------+----------+--------------+ CFV      Full           Yes      Yes                                  +---------+---------------+---------+-----------+----------+--------------+ SFJ      Full                                                        +---------+---------------+---------+-----------+----------+--------------+ FV Prox  Full                                                        +---------+---------------+---------+-----------+----------+--------------+ FV Mid   Full                                                        +---------+---------------+---------+-----------+----------+--------------+ FV DistalFull                                                        +---------+---------------+---------+-----------+----------+--------------+ PFV      Full                                                        +---------+---------------+---------+-----------+----------+--------------+  POP      Full           Yes      Yes                                 +---------+---------------+---------+-----------+----------+--------------+ PTV      Full                                                        +---------+---------------+---------+-----------+----------+--------------+ PERO     Full                                                        +---------+---------------+---------+-----------+----------+--------------+   +----+---------------+---------+-----------+----------+--------------+ LEFTCompressibilityPhasicitySpontaneityPropertiesThrombus Aging +----+---------------+---------+-----------+----------+--------------+ CFV Full           Yes      Yes                                 +----+---------------+---------+-----------+----------+--------------+     Summary: RIGHT: - There is no evidence of deep vein thrombosis in the lower extremity.  - No cystic structure found in the popliteal fossa.  LEFT: - No evidence of common femoral vein obstruction.  *See table(s) above for measurements and observations. Electronically signed by Curt Jews MD on  07/25/2020 at 8:14:08 PM.    Final     Medications: Infusions: .  prismasol BGK 4/2.5 400 mL/hr at 07/26/20 0717  . sodium chloride Stopped (07/22/20 1629)  . sodium chloride 10 mL/hr at 07/26/20 1200  . sodium chloride 20 mL/hr at 07/19/20 2000  . ceFEPime (MAXIPIME) IV Stopped (07/26/20 0551)  . feeding supplement (PIVOT 1.5 CAL) Stopped (07/26/20 0816)  . fentaNYL infusion INTRAVENOUS 100 mcg/hr (07/26/20 1200)  . norepinephrine (LEVOPHED) Adult infusion Stopped (07/25/20 0346)  . prismasol BGK 2/2.5 replacement solution 300 mL/hr at 07/26/20 0909  . prismasol BGK 4/2.5 1,800 mL/hr at 07/26/20 1107  . propofol (DIPRIVAN) infusion      Scheduled Medications: . B-complex with vitamin C  1 tablet Per Tube Daily  . chlorhexidine gluconate (MEDLINE KIT)  15 mL Mouth Rinse BID  . Chlorhexidine Gluconate Cloth  6 each Topical Daily  . clonazePAM  1 mg Per Tube BID  . dextrose      . docusate  100 mg Per Tube BID  . feeding supplement (PROSource TF)  45 mL Per Tube QID  . heparin injection (subcutaneous)  5,000 Units Subcutaneous Q8H  . mouth rinse  15 mL Mouth Rinse 10 times per day  . pantoprazole sodium  40 mg Per Tube Daily  . polyethylene glycol  17 g Per Tube Daily  . QUEtiapine  25 mg Per Tube BID  . sodium chloride flush  10-40 mL Intracatheter Q12H    have reviewed scheduled and prn medications.  Physical Exam: General: Critically ill looking male intubated, sedated Heart:RRR, s1s2 nl Lungs: Coarse breath sound bilateral Abdomen:soft, Non-tender, non-distended Extremities:  LE edema+ Dialysis Access: IJ temporary HD catheter placed by ICU on 4/28  Alex [name] 07/26/2020,12:42 PM  LOS: 8  days

## 2020-07-27 ENCOUNTER — Encounter (HOSPITAL_COMMUNITY): Payer: Self-pay | Admitting: Internal Medicine

## 2020-07-27 DIAGNOSIS — J9601 Acute respiratory failure with hypoxia: Secondary | ICD-10-CM | POA: Diagnosis not present

## 2020-07-27 DIAGNOSIS — I351 Nonrheumatic aortic (valve) insufficiency: Secondary | ICD-10-CM | POA: Diagnosis not present

## 2020-07-27 DIAGNOSIS — I358 Other nonrheumatic aortic valve disorders: Secondary | ICD-10-CM | POA: Diagnosis not present

## 2020-07-27 DIAGNOSIS — Z9911 Dependence on respirator [ventilator] status: Secondary | ICD-10-CM | POA: Diagnosis not present

## 2020-07-27 DIAGNOSIS — N179 Acute kidney failure, unspecified: Secondary | ICD-10-CM | POA: Diagnosis not present

## 2020-07-27 LAB — GLUCOSE, CAPILLARY
Glucose-Capillary: 112 mg/dL — ABNORMAL HIGH (ref 70–99)
Glucose-Capillary: 109 mg/dL — ABNORMAL HIGH (ref 70–99)
Glucose-Capillary: 121 mg/dL — ABNORMAL HIGH (ref 70–99)
Glucose-Capillary: 99 mg/dL (ref 70–99)
Glucose-Capillary: 112 mg/dL — ABNORMAL HIGH (ref 70–99)
Glucose-Capillary: 93 mg/dL (ref 70–99)
Glucose-Capillary: 117 mg/dL — ABNORMAL HIGH (ref 70–99)

## 2020-07-27 LAB — RENAL FUNCTION PANEL
Albumin: 2.1 g/dL — ABNORMAL LOW (ref 3.5–5.0)
Albumin: 2.1 g/dL — ABNORMAL LOW (ref 3.5–5.0)
Anion gap: 9 (ref 5–15)
Anion gap: 15 (ref 5–15)
BUN: 52 mg/dL — ABNORMAL HIGH (ref 6–20)
BUN: 81 mg/dL — ABNORMAL HIGH (ref 6–20)
CO2: 25 mmol/L (ref 22–32)
CO2: 18 mmol/L — ABNORMAL LOW (ref 22–32)
Calcium: 8.7 mg/dL — ABNORMAL LOW (ref 8.9–10.3)
Calcium: 8.7 mg/dL — ABNORMAL LOW (ref 8.9–10.3)
Chloride: 96 mmol/L — ABNORMAL LOW (ref 98–111)
Chloride: 95 mmol/L — ABNORMAL LOW (ref 98–111)
Creatinine, Ser: 2.16 mg/dL — ABNORMAL HIGH (ref 0.61–1.24)
Creatinine, Ser: 3.52 mg/dL — ABNORMAL HIGH (ref 0.61–1.24)
GFR, Estimated: 39 mL/min — ABNORMAL LOW (ref >60)
GFR, Estimated: 22 mL/min — ABNORMAL LOW (ref >60)
Glucose, Bld: 123 mg/dL — ABNORMAL HIGH (ref 70–99)
Glucose, Bld: 90 mg/dL (ref 70–99)
Phosphorus: 5.5 mg/dL — ABNORMAL HIGH (ref 2.5–4.6)
Phosphorus: 7.1 mg/dL — ABNORMAL HIGH (ref 2.5–4.6)
Potassium: 4.8 mmol/L (ref 3.5–5.1)
Potassium: 6.8 mmol/L (ref 3.5–5.1)
Sodium: 130 mmol/L — ABNORMAL LOW (ref 135–145)
Sodium: 128 mmol/L — ABNORMAL LOW (ref 135–145)

## 2020-07-27 LAB — CBC
HCT: 26.2 % — ABNORMAL LOW (ref 39.0–52.0)
Hemoglobin: 8.2 g/dL — ABNORMAL LOW (ref 13.0–17.0)
MCH: 27.9 pg (ref 26.0–34.0)
MCHC: 31.3 g/dL (ref 30.0–36.0)
MCV: 89.1 fL (ref 80.0–100.0)
Platelets: 191 10*3/uL (ref 150–400)
RBC: 2.94 MIL/uL — ABNORMAL LOW (ref 4.22–5.81)
RDW: 18.6 % — ABNORMAL HIGH (ref 11.5–15.5)
WBC: 18.1 10*3/uL — ABNORMAL HIGH (ref 4.0–10.5)
nRBC: 0 % (ref 0.0–0.2)

## 2020-07-27 LAB — MAGNESIUM: Magnesium: 3.1 mg/dL — ABNORMAL HIGH (ref 1.7–2.4)

## 2020-07-27 LAB — TRIGLYCERIDES: Triglycerides: 893 mg/dL — ABNORMAL HIGH (ref <150)

## 2020-07-27 MED ORDER — HALOPERIDOL LACTATE 5 MG/ML IJ SOLN
2.0000 mg | Freq: Four times a day (QID) | INTRAMUSCULAR | Status: DC | PRN
  Administered 2020-07-27 – 2020-08-02 (×2): 2 mg via INTRAVENOUS
  Filled 2020-07-27 (×4): qty 1

## 2020-07-27 MED ORDER — INSULIN ASPART 100 UNIT/ML IV SOLN
5.0000 [IU] | Freq: Once | INTRAVENOUS | Status: AC
  Administered 2020-07-27: 5 [IU] via INTRAVENOUS

## 2020-07-27 MED ORDER — SODIUM CHLORIDE 0.9 % IV SOLN
1.0000 g | INTRAVENOUS | Status: DC
  Filled 2020-07-27: qty 1

## 2020-07-27 MED ORDER — MELATONIN 3 MG PO TABS
3.0000 mg | ORAL_TABLET | Freq: Every day | ORAL | Status: DC
  Administered 2020-07-27 – 2020-07-28 (×2): 3 mg
  Filled 2020-07-27 (×2): qty 1

## 2020-07-27 MED ORDER — SODIUM BICARBONATE 8.4 % IV SOLN: INTRAVENOUS | Status: DC

## 2020-07-27 MED ORDER — CALCIUM GLUCONATE-NACL 1-0.675 GM/50ML-% IV SOLN
1.0000 g | Freq: Once | INTRAVENOUS | Status: AC
  Administered 2020-07-27: 1000 mg via INTRAVENOUS
  Filled 2020-07-27: qty 50

## 2020-07-27 MED ORDER — QUETIAPINE FUMARATE 100 MG PO TABS
100.0000 mg | ORAL_TABLET | Freq: Every day | ORAL | Status: DC
  Administered 2020-07-27: 100 mg
  Filled 2020-07-27: qty 1

## 2020-07-27 MED ORDER — SODIUM BICARBONATE 8.4 % IV SOLN
Freq: Once | INTRAVENOUS | Status: AC
  Filled 2020-07-27: qty 1000

## 2020-07-27 MED ORDER — SODIUM ZIRCONIUM CYCLOSILICATE 10 G PO PACK
10.0000 g | PACK | Freq: Three times a day (TID) | ORAL | Status: DC
  Administered 2020-07-27: 10 g
  Filled 2020-07-27 (×3): qty 1

## 2020-07-27 MED ORDER — DEXTROSE 50 % IV SOLN
1.0000 | Freq: Once | INTRAVENOUS | Status: AC
  Administered 2020-07-27: 50 mL via INTRAVENOUS
  Filled 2020-07-27: qty 50

## 2020-07-27 MED ORDER — NEPRO/CARBSTEADY PO LIQD
237.0000 mL | Freq: Two times a day (BID) | ORAL | Status: DC
  Administered 2020-07-29 – 2020-07-31 (×3): 237 mL via ORAL

## 2020-07-27 MED ORDER — ALBUTEROL SULFATE (2.5 MG/3ML) 0.083% IN NEBU
10.0000 mg | INHALATION_SOLUTION | Freq: Once | RESPIRATORY_TRACT | Status: AC
  Administered 2020-07-27: 10 mg via RESPIRATORY_TRACT
  Filled 2020-07-27: qty 12

## 2020-07-27 MED ORDER — RENA-VITE PO TABS
1.0000 | ORAL_TABLET | Freq: Every day | ORAL | Status: DC
  Administered 2020-07-27 – 2020-08-01 (×6): 1 via ORAL
  Filled 2020-07-27 (×6): qty 1

## 2020-07-27 MED ORDER — QUETIAPINE FUMARATE 25 MG PO TABS: 25.0000 mg | ORAL_TABLET | Freq: Every morning | ORAL | Status: DC

## 2020-07-27 MED ORDER — SODIUM BICARBONATE 8.4 % IV SOLN
50.0000 meq | Freq: Once | INTRAVENOUS | Status: AC
  Administered 2020-07-27: 50 meq via INTRAVENOUS
  Filled 2020-07-27: qty 50

## 2020-07-27 NOTE — Progress Notes (Addendum)
NAME:  Alex [name], MRN:  [id number], DOB:  1981/01/26, LOS: 9 ADMISSION DATE:  07/18/2020, CONSULTATION DATE:  4/26 REFERRING MD:  Dr. Florina Ou, CHIEF COMPLAINT:  AMS   History of Present Illness:  40 yo male smoker brought to [facility] ER with altered mental status.  Noted to have headache and feeling feverish before admission.  Found to have hypotension, AKI, thrombocytopenia, splenomegaly, lactic acidosis, elevated bilirubin, hypoglycemia.  Started on antibiotics and pressors for possible sepsis, but no clear source of infection.  Normal renal function from February 2022.  Uses suboxone for history of substance abuse.  UDS positive for amphetamines.  Transfer to [facility] for further therapy.  Hx from chart, medical team, and pt's mother.  Pertinent  Medical History  Polysubstance Abuse - amphetamines, heroin. On Suboxone.   Ruptured Disc  Significant Hospital Events: Including procedures, antibiotic start and stop dates in addition to other pertinent events   . 4/26 transfer from [facility] to [facility] . CT head 4/26 >> no acute findings . CT abd/pelvis 4/26 >> cholelithiasis w/o inflammatory chagnes, mild hepatomegaly (25 cm), moderate splenomegaly (16.7 cm), 2 mm non obstructing kidney stone on Lt . 4/27 CVL placed. Pressors escalating.  . 4/28 intubated for TEE, CVC, HD, and A-line placed. CRRT started  . Remains on CRRT . MRI on 4/30-multiple infarcts . 5/4: repeat TEE shows an aortic valve abscess  Interim History / Subjective:   No acute overnight events. Patient continues to be awake and alert on exam. Denies any pain.  Objective   Blood pressure (!) 103/45, pulse 92, temperature 97.6 F (36.4 C), temperature source Oral, resp. rate (!) 22, height '6\' 4"'$  (1.93 m), weight 119.4 kg, SpO2 100 %.    Vent Mode: PRVC FiO2 (%):  [30 %] 30 % Set Rate:  [15 bmp] 15 bmp Vt Set:  [600 mL] 600 mL PEEP:  [5 cmH20] 5 cmH20 Pressure Support:  [5 cmH20] 5 cmH20 Plateau Pressure:  [10 cmH20-15 cmH20] 13  cmH20   Intake/Output Summary (Last 24 hours) at 07/27/2020 0753 Last data filed at 07/27/2020 0700 Gross per 24 hour  Intake 2252.85 ml  Output 4676 ml  Net -2423.15 ml   Filed Weights   07/25/20 0331 07/26/20 0500 07/27/20 0412  Weight: 126.5 kg 120.5 kg 119.4 kg    Examination:  General: No acute distress, awake and following simple commands, spontaneously moving all extremities HEENT: Moist oral mucosa, endotracheal tube in place Neuro: Following simple commands, moving all 4 extremities CV: Regular rate and rhythm, no murmurs rubs or gallops PULM: Clear to auscultation bilaterally GI: Soft, bowel sounds present, nontender to palpation Extremities: Skin is warm and dry, significant generalized edema Skin: no rashes or lesions  Labs/imaging that I havepersonally reviewed    4/28 ECHO Left ventricular EF  60 to 65%. nromal LV and RV function. Perforated left coronary cusp with severe aortic regurgitation. Large  36 x 57m mobile vegetation on ventricular surface of left coronary cusp.  The aortic valve is abnormal. Aortic valve regurgitation is severe.   4/30 MRI: IMPRESSION: Dozens of primarily punctate acute infarctions scattered throughout the cerebellum and both cerebral hemispheres consistent with micro embolic infarctions from the heart or ascending aorta. Several are associated with petechial blood products as outlined above. Septic emboli not excluded given the clinical history.  5/3: RLE doppler negative for DVT  5/4: aortic root sinus of valvasa abscess   Resolved Hospital Problem list   Hyponatremia, septic shock, metabolic encephalopathy  Assessment & Plan:   44 yoM who presented with AMS found to be in septic shock secondary to serratia bacteremia, requiring pressor support. MRI brain demonstrated multiple infarcts likely in the setting of septic emboli. He required intubation for TEE which confirmed endocarditis with severe AI. He has been difficult to wean from  propofol and ventilatory support.  Serratia bacteremia and aortic valve endocarditis Multiple infarcts on MRI brain Repeat TEE yesterday due to arrhythmias noted on cardiac monitoring, demonstrated aortic valve abscess. Cardiothoracic surgery following for possible valve repair. Plan to extubate today. -Continue cefepime -Will need dental clearance prior to valve repair -Appreciate ID and CTS input  Acute hypoxic respiratory failure Patient was initially intubated for TEE.  Significantly improved, mentating well and able to follow simple commands. Optimizing patient's volume status prior to extubation, plan to extubate today.  Acute kidney injury Anuria Likely ischemic ATN in the setting of septic shock and septic emboli.  Requiring CRRT since 4/28, appreciate nephrology recommendations.  Per nephrology, likely can transition to inpatient HD this week.  Patient is still not producing any urine. -Bladder scan per shift  Anemia Thrombocytopenia Hgb stable, 8.2. Platelets improving, 191. -Continue to intermittently trend CBC  Polysubstance abuse -Suboxone, amphetamines -Cessation education when appropriate   Best practice   Diet:  Tube Feed  Pain/Anxiety/Delirium protocol (if indicated): No VAP protocol (if indicated): Yes DVT prophylaxis: SCD GI prophylaxis: PPI Glucose control:  SSI No Central venous access:  N/A Arterial line:  N/A Foley:  N/A Mobility:  bed rest  PT consulted: N/A Last date of multidisciplinary goals of care discussion: mother updated at bedside 5/4 Code Status:  full code Disposition: ICU   Jodel Mayhall, DO PGY-2 IMTS

## 2020-07-27 NOTE — Progress Notes (Signed)
At 0900 patient removed HD catheter.  Catheter was intact, pressure was applied to sight.  MD at bedside and aware.  CRRT will be discontinued.  No new orders received at this time.  Will continue to monitor patient.

## 2020-07-27 NOTE — Progress Notes (Addendum)
Nutrition Follow-up  DOCUMENTATION CODES:   Obesity unspecified  INTERVENTION:   ADDENDUM (1436): Pt's diet was advanced to Regular today then later changed to renal diet with 1 L fluid restriction. RD to order Nepro Shake po BID, each supplement provides 425 kcal and 19 grams protein. Recommend renal MVI.  - d/c Pivot 1.5 tube feeds and ProSource TF as pt without enteral access  - d/c B-complex with vitamin C as pt is off CRRT  NUTRITION DIAGNOSIS:   Inadequate oral intake related to inability to eat,lethargy/confusion as evidenced by NPO status.  Ongoing  GOAL:   Patient will meet greater than or equal to 90% of their needs  Unmet at this time  MONITOR:   Diet advancement,Labs,Weight trends,Skin,I & O's  REASON FOR ASSESSMENT:   Consult Enteral/tube feeding initiation and management  ASSESSMENT:   40 year old male who presented to the ED on 4/26 with AMS. PMH of polysubstance abuse. Pt admitted with septic shock secondary to GNR bacteremia and acute renal failure.  4/28 - intubated, TEE, CRRT initiated 4/29 - Cortrak placed (gastric tip) 5/04 - TEE 5/05 - extubated to BiPAP, CRRT discontinued (pt pulled dialysis catheter)  Discussed pt with RN and during ICU rounds. Per CCM, pt remains high risk for acute pulmonary edema with loss of positive pressure due to severe aortic regurgitation. Pt is NPO at this time and until doing well off NIPPV per CCM.  Pt is off CRRT due to pulling his dialysis catheter this morning. Plan is to replace tomorrow and for pt to transition to [facility] tomorrow.  Cardiothoracic surgery is on board for valve replacement surgery. Per note, pt tentatively scheduled for surgery on 07/31/20. 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  Admit weight: 127.1 kg Current weight: 119.4 kg  Medications reviewed and include: B-complex with vitamin C, colace, protonix, miralax, IV abx  Labs reviewed: sodium 130,  BUN 52, creatinine 2.16, phosphorus 5.5, magnesium 3.1, hemoglobin 8.2 CBG's: 79-121 x 24 hours  CRRT UF: 4676 ml x 24 hours I/O's: -2.7 L since admit  Diet Order:   Diet Order            Diet regular Room service appropriate? Yes with Assist; Fluid consistency: Thin  Diet effective now                 EDUCATION NEEDS:   Not appropriate for education at this time  Skin:  Skin Assessment: Skin Integrity Issues: Stage II: coccyx  Last BM:  07/27/20 type 6  Height:   Ht Readings from Last 1 Encounters:  07/18/20 '6\' 4"'$  (1.93 m)    Weight:   Wt Readings from Last 1 Encounters:  07/27/20 119.4 kg    Ideal Body Weight:  91.8 kg  BMI:  Body mass index is 32.04 kg/m.  Estimated Nutritional Needs:   Kcal:  2500-2700  Protein:  120-140 grams  Fluid:  >/= 2.0 L    Gustavus Bryant, MS, RD, LDN Inpatient Clinical Dietitian Please see AMiON for contact information.

## 2020-07-27 NOTE — Consult Note (Signed)
Chief Complaint: Patient was seen in consultation today for acute kidney injury  Referring Physician(s): Dr. Joelyn Oms  Supervising Physician: [name]  Patient Status: [facility] - In-pt  History of Present Illness: Alex [name] is a 40 y.o. male of substance abuse, ruptured disc who presented to initially presented to [facility] ED with AMS due to septic shock and Serratia bacteremia/AV endocarditis.  He had septic shock with multiorgan failure requiring intubation and CRRT.  He now has been extubated, but remains on Bipap.  He is confused.  CRRT for AKI was to continue, however patient pulled his catheter this AM. IR now consulted for tunneled dialysis catheter placement.   Patient assessed on unit today.  He is on Bipap.  Confused.  Not following commands, although appears to attempt and ultimately complies with requests to position and turn.   Past Medical History:  Diagnosis Date  . Ruptured lumbar disc     History reviewed. No pertinent surgical history.  Allergies: Patient has no known allergies.  Medications: Prior to Admission medications   Medication Sig Start Date End Date Taking? Authorizing Provider  buprenorphine-naloxone (SUBOXONE) 8-2 mg SUBL SL tablet Place 1 tablet under the tongue 2 (two) times daily. May place additional 1/2 tablet under the tongue daily as needed 06/30/20 07/30/20 Yes [provider]  lisinopril-hydrochlorothiazide (ZESTORETIC) 10-12.5 MG tablet Take 1 tablet by mouth daily. 11/12/19  Yes [provider]  naloxone (NARCAN) nasal spray 4 mg/0.1 mL Place 1 spray into the nose once. As needed for up to 1 dose. 01/16/18   [provider]     History reviewed. No pertinent family history.  Social History   Socioeconomic History  . Marital status: Single    Spouse name: Not on file  . Number of children: Not on file  . Years of education: Not on file  . Highest education level: Not on file  Occupational History  . Not  on file  Tobacco Use  . Smoking status: Current Every Day Smoker    Packs/day: 1.00    Types: Cigarettes  . Smokeless tobacco: Never Used  Vaping Use  . Vaping Use: Never used  Substance and Sexual Activity  . Alcohol use: No  . Drug use: Yes    Types: Marijuana    Comment: denies use 08/24/17  . Sexual activity: Not on file  Other Topics Concern  . Not on file  Social History Narrative  . Not on file   Social Determinants of Health   Financial Resource Strain: Not on file  Food Insecurity: Not on file  Transportation Needs: Not on file  Physical Activity: Not on file  Stress: Not on file  Social Connections: Not on file     Review of Systems: A 12 point ROS discussed and pertinent positives are indicated in the HPI above.  All other systems are negative.  Review of Systems  Unable to perform ROS: Mental status change    Vital Signs: BP (!) 121/100 (BP Location: Right Wrist)   Pulse (!) 114   Temp 100.3 F (37.9 C) (Axillary)   Resp (!) 26   Ht '6\' 4"'$  (1.93 m)   Wt 263 lb 3.7 oz (119.4 kg)   SpO2 98%   BMI 32.04 kg/m   Physical Exam Vitals and nursing note reviewed.  Constitutional:      General: He is not in acute distress.    Appearance: Normal appearance. He is not ill-appearing.  HENT:     Mouth/Throat:  Mouth: Mucous membranes are moist.  Cardiovascular:     Rate and Rhythm: Normal rate and regular rhythm.  Pulmonary:     Comments: On Bipap Skin:    General: Skin is warm and dry.  Neurological:     Mental Status: He is alert. He is disoriented.      MD Evaluation Airway: WNL Heart: WNL Abdomen: WNL Chest/ Lungs: WNL ASA  Classification: 3 Mallampati/Airway Score: Two   Imaging: CT ABDOMEN PELVIS WO CONTRAST  Result Date: 07/18/2020 CLINICAL DATA:  Acute, nonlocalized abdominal pain, acute renal failure, leukocytosis EXAM: CT ABDOMEN AND PELVIS WITHOUT CONTRAST TECHNIQUE: Multidetector CT imaging of the abdomen and pelvis was  performed following the standard protocol without IV contrast. COMPARISON:  None. FINDINGS: Lower chest: Mild bibasilar atelectasis. The visualized heart and pericardium are unremarkable. Hepatobiliary: Cholelithiasis without pericholecystic inflammatory change. Mild hepatomegaly with the liver measuring 25 cm in craniocaudal dimension. Liver otherwise unremarkable. No intra or extrahepatic biliary ductal dilation. Pancreas: Unremarkable Spleen: Moderate splenomegaly with the spleen measuring up to 16.7 cm in greatest dimension. No definite intra splenic lesion identified on this noncontrast examination. Adrenals/Urinary Tract: The adrenal glands are unremarkable. The kidneys are normal in size and position. 2 mm nonobstructing calculus within the lower pole of the left kidney. The kidneys are otherwise unremarkable. Foley catheter balloon is seen within the decompressed bladder lumen. Stomach/Bowel: Stomach is within normal limits. Appendix appears normal. No evidence of bowel wall thickening, distention, or inflammatory changes. No free intraperitoneal gas or fluid. Vascular/Lymphatic: No significant vascular findings are present. No enlarged abdominal or pelvic lymph nodes. Reproductive: Prostate is unremarkable. Other: No abdominal wall hernia.  Rectum unremarkable. Musculoskeletal: No lytic or blastic bone lesion. No acute bone abnormality. IMPRESSION: Mild to moderate hepatosplenomegaly. Cholelithiasis without superimposed inflammatory change. Minimal left nonobstructing nephrolithiasis. Electronically Signed   By: Fidela Salisbury MD   On: 07/18/2020 05:53   DG Chest 1 View  Result Date: 07/18/2020 CLINICAL DATA:  40 year old male with central line placement. EXAM: CHEST  1 VIEW COMPARISON:  Earlier radiograph dated 07/18/2020. FINDINGS: Right subclavian central venous line with tip over the cavoatrial junction. No pneumothorax. There is mild cardiomegaly with mild vascular congestion. No focal  consolidation, pleural effusion, or pneumothorax. No acute osseous pathology. IMPRESSION: Interval placement of a right subclavian central venous line with tip over the cavoatrial junction. No pneumothorax. Electronically Signed   By: Anner Crete M.D.   On: 07/18/2020 23:35   CT Head Wo Contrast  Result Date: 07/18/2020 CLINICAL DATA:  Delirium, acute renal failure, leukocytosis EXAM: CT HEAD WITHOUT CONTRAST TECHNIQUE: Contiguous axial images were obtained from the base of the skull through the vertex without intravenous contrast. COMPARISON:  None. FINDINGS: Brain: Normal anatomic configuration. No abnormal intra or extra-axial mass lesion or fluid collection. No abnormal mass effect or midline shift. No evidence of acute intracranial hemorrhage or infarct. Ventricular size is normal. Cerebellum unremarkable. Vascular: No hyperdense vessel or unexpected calcification. Skull: Intact Sinuses/Orbits: Paranasal sinuses are clear. Orbits are unremarkable. Other: Mastoid air cells and middle ear cavities are clear. IMPRESSION: No acute intracranial abnormality. Electronically Signed   By: Fidela Salisbury MD   On: 07/18/2020 05:47   MR ANGIO HEAD WO CONTRAST  Result Date: 07/25/2020 CLINICAL DATA:  Stroke, follow-up. EXAM: MRA HEAD WITHOUT CONTRAST TECHNIQUE: Angiographic images of the Circle of Willis were obtained using MRA technique without intravenous contrast. COMPARISON:  Brain MRI 07/22/2020. FINDINGS: Moderate to moderately severe motion degradation, limiting evaluation. This precludes  adequate evaluation for arterial stenoses. This also significantly limits evaluation for intracranial aneurysms. The intracranial internal carotid arteries, middle cerebral arteries and anterior cerebral arteries are patent without appreciable proximal large vessel occlusion. The vertebral arteries, basilar artery and posterior cerebral arteries are patent without appreciable proximal large vessel occlusion. Apparent  moderate stenosis within the right posterior cerebral artery at the P1/P2 junction (series 2, image 83). Posterior communicating arteries are hypoplastic or absent bilaterally. IMPRESSION: No evidence of intracranial proximal large vessel occlusion. Significant motion degradation, precluding adequate evaluation for intracranial arterial stenosis. Apparent moderate stenosis within the right posterior cerebral artery at the P1/P2 junction. Electronically Signed   By: Kellie Simmering DO   On: 07/25/2020 13:19   MR ANGIO NECK WO CONTRAST  Result Date: 07/25/2020 CLINICAL DATA:  Provided history: Septic arterial embolism. EXAM: MRA NECK WITHOUT CONTRAST TECHNIQUE: Multiplanar and multiecho pulse sequences of the neck were obtained without intravenous contrast. Angiographic images of the neck were obtained using MRA technique without intravenous contrast. COMPARISON:  Brain MRI 07/22/2020. FINDINGS: The examination is moderately motion degraded, limiting evaluation. Additionally, the origins of the left common carotid and left vertebral arteries are excluded from the field of view. The visible common carotid and internal carotid arteries are patent without appreciable hemodynamically significant stenosis. Tortuosity of the distal cervical ICAs bilaterally. The visible vertebral arteries are patent within the neck with antegrade flow. Left vertebral artery dominant. Apparent moderate/severe stenosis at the origin of the non dominant right vertebral artery. Additionally, there is an apparent moderate stenosis within the right vertebral artery at the V1/V2 junction. However, these apparent stenoses could potentially be exaggerate by motion artifact and noncontrast technique. IMPRESSION: The examination is moderately motion degraded, limiting evaluation. Additionally, the origins of the left common carotid and left vertebral arteries are excluded from the field of view. The visible common carotid and internal carotid arteries  are patent within the neck without appreciable hemodynamically significant stenosis. The visible vertebral arteries are patent within the neck with antegrade flow. Apparent stenoses within the non-dominant right vertebral artery at the vessel origin (moderate/severe), and at the V1/V2 junction (moderate). However, these apparent stenoses could potentially be exaggerated by motion artifact and non-contrast technique. Electronically Signed   By: Kellie Simmering DO   On: 07/25/2020 13:10   MR BRAIN WO CONTRAST  Result Date: 07/22/2020 CLINICAL DATA:  Headache, fever and altered mental status. Possible sepsis. Polysubstance abuse. EXAM: MRI HEAD WITHOUT CONTRAST TECHNIQUE: Multiplanar, multiecho pulse sequences of the brain and surrounding structures were obtained without intravenous contrast. 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 Most of these are 3-4 mm in size or less. The largest infarction is in the left parieto-occipital junction region measuring 1.7 cm. There is involvement of the central pons, both cerebellar hemispheres, both cerebral hemispheres. A small amount of hemorrhage is associated with lesions in the cerebellar vermis, the left parietooccipital junction, the right posterior frontal brain in the right parietal brain. Punctate blood products evident with some of the other small infarctions. No evidence of vasogenic edema. No mass effect or shift. Given the history of polysubstance abuse, septic emboli are not excluded. No hydrocephalus or extra-axial collection. No evidence of pre-existing brain insult. Vascular: Major vessels at the base of the brain show flow. Skull and upper cervical spine: Negative Sinuses/Orbits: Clear/normal Other: Bilateral mastoid effusions. 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 Several are associated with petechial blood products as outlined above. Septic emboli not excluded given the clinical history. Electronically Signed   By: Nelson Chimes M.D.   On: 07/22/2020 17:27   US RENAL  Result Date: 07/18/2020 CLINICAL DATA:  Acute kidney injury. EXAM: RENAL / URINARY TRACT ULTRASOUND COMPLETE COMPARISON:  Noncontrast CT earlier today. FINDINGS: Right Kidney: Renal measurements: 13.1 x 6.5 x 6.4 cm = volume: 281 mL. Echogenicity within normal limits. No hydronephrosis. No focal renal lesion or stone. Left Kidney: Renal measurements: 14.2 x 7.2 x 6.3 cm = volume: 338 mL. Echogenicity within normal limits. No hydronephrosis. No focal renal lesion. Nonobstructing intrarenal stone on CT earlier today is not visualized. Bladder: Decompressed by Foley catheter. Other: Splenomegaly as seen on CT earlier today. Cystic lesion at the hilum measures 3.3 cm. What is labeled by the technologist as incidental spleen represents bowel. Technically challenging exam due to habitus. IMPRESSION: 1. No obstructive uropathy. 2. Punctate nonobstructing left renal stone on CT earlier today is not seen by ultrasound. Electronically Signed   By: Keith Rake M.D.   On: 07/18/2020 15:04   DG CHEST PORT 1 VIEW  Result Date: 07/22/2020 CLINICAL DATA:  Respiratory failure EXAM: PORTABLE CHEST 1 VIEW COMPARISON:  July 20, 2020 FINDINGS: The ET tube and left central line are in good position. A right central line is in good position. The feeding tube terminates below today's film. Cardiomediastinal silhouette is stable. No pulmonary nodules or masses. No focal infiltrates. No overt edema. IMPRESSION: 1. Support apparatus as above. 2. No other acute abnormalities. Electronically Signed   By: Dorise Bullion III M.D   On: 07/22/2020 14:57   DG CHEST PORT 1  VIEW  Result Date: 07/20/2020 CLINICAL DATA:  Central line placement. EXAM: PORTABLE CHEST 1 VIEW COMPARISON:  Chest x-ray from same day at 12:09 p.m. FINDINGS: New left internal jugular dialysis catheter with tip in the proximal SVC. Unchanged right subclavian central venous catheter. Endotracheal tube remains in good position with the tip 3.7 cm above the carina. Normal heart size. Patchy airspace disease throughout both lungs, worse on the right, not significantly changed. No pleural effusion or pneumothorax. No acute osseous abnormality. IMPRESSION: 1. New left internal jugular dialysis catheter with tip in the proximal SVC. No pneumothorax. 2. Unchanged multifocal airspace disease. Electronically Signed   By: Titus Dubin M.D.   On: 07/20/2020 18:29   DG CHEST PORT 1 VIEW  Result Date: 07/20/2020 CLINICAL DATA:  Status post intubation. EXAM: PORTABLE CHEST 1 VIEW COMPARISON:  Single-view of the chest 07/19/2020. FINDINGS: Endotracheal tube is in place with the tip in good position at the level the clavicular heads. Right subclavian catheter is unchanged. New patchy bilateral airspace disease is much worse on the right. Heart size is enlarged. No pneumothorax. IMPRESSION: ETT in good position. Right much worse than left patchy airspace disease worrisome for pneumonia. Electronically Signed   By: Inge Rise M.D.   On: 07/20/2020 12:48   DG Chest Port 1 View  Result Date: 07/19/2020 CLINICAL DATA:  Hypoxia EXAM: PORTABLE CHEST 1 VIEW COMPARISON:  07/18/2020 FINDINGS: Right subclavian central venous catheter tip noted within the right atrium. Lung volumes are slightly small, but are stable, with mild left basilar atelectasis. No superimposed confluent pulmonary infiltrate. No pneumothorax or pleural effusion. Cardiac size is within normal limits. Pulmonary vascularity  is normal. IMPRESSION: Stable examination.  Mild left basilar atelectasis. Electronically Signed   By: Fidela Salisbury MD   On:  07/19/2020 05:15   DG Chest Portable 1 View  Result Date: 07/18/2020 CLINICAL DATA:  Fever EXAM: PORTABLE CHEST 1 VIEW COMPARISON:  None. FINDINGS: Lung volumes are small, but are symmetric and are clear. No pneumothorax or pleural effusion. Cardiac size within normal limits. Pulmonary vascularity is normal. No acute bone abnormality. IMPRESSION: Pulmonary hypoinflation. Electronically Signed   By: Fidela Salisbury MD   On: 07/18/2020 04:09   DG Abd Portable 1V  Result Date: 07/25/2020 CLINICAL DATA:  Orogastric tube placement. EXAM: PORTABLE ABDOMEN - 1 VIEW COMPARISON:  July 22, 2020 FINDINGS: Orogastric tube with tip and side port overlying the stomach. Weighted enteric feeding catheter with tip overlying the stomach. Endotracheal tube with tip overlying the distal thoracic trachea. Left approach central venous catheter with tip overlying the SVC. The bowel gas pattern is normal. IMPRESSION: Orogastric tube with tip and side port overlying the stomach. Electronically Signed   By: Dahlia Bailiff MD   On: 07/25/2020 10:38   ECHOCARDIOGRAM COMPLETE  Result Date: 07/19/2020    ECHOCARDIOGRAM REPORT   Patient Name:   Alex [name] Date of Exam: 07/19/2020 Medical Rec #:  [id number]         Height:       76.0 in Accession #:    [id number]        Weight:       287.0 lb Date of Birth:  1980/09/23         BSA:          2.583 m Patient Age:    39 years          BP:           115/48 mmHg Patient Gender: M                 HR:           85 bpm. Exam Location:  Inpatient Procedure: 2D Echo, Cardiac Doppler and Color Doppler  Results discussed with Dr [name] at 1158. Indications:     Shock  History:         Patient has no prior history of Echocardiogram examinations.                  Risk Factors:Current Smoker.  Sonographer:     Cammy Brochure Referring Phys:  [place] Diagnosing Phys: Oswaldo Milian MD IMPRESSIONS  1. Left ventricular ejection fraction, by estimation, is 60 to 65%. The left  ventricle has normal function. The left ventricle has no regional wall motion abnormalities. Left ventricular diastolic parameters were normal.  2. Right ventricular systolic function is normal. The right ventricular size is normal. Tricuspid regurgitation signal is inadequate for assessing PA pressure.  3. The mitral valve is normal in structure. No evidence of mitral valve regurgitation.  4. The aortic valve was not well visualized. Aortic valve regurgitation is severe. No aortic stenosis is present. Poor visualization of aortic valve but leaflets appears thickened, concerning for vegetation on AV causing eccentric AI that appears severe. Holodiastolic flow reveral is seen in thoracic aorta, consistent with severe AI. Recommend TEE for further evaluation. FINDINGS  Left Ventricle: Left ventricular ejection fraction, by estimation, is 60 to 65%. The left ventricle has normal function. The left ventricle has no regional wall motion abnormalities. The left ventricular internal cavity size was normal in size.  There is  no left ventricular hypertrophy. Left ventricular diastolic parameters were normal. Right Ventricle: The right ventricular size is normal. No increase in right ventricular wall thickness. Right ventricular systolic function is normal. Tricuspid regurgitation signal is inadequate for assessing PA pressure. Left Atrium: Left atrial size was normal in size. Right Atrium: Right atrial size was not well visualized. Pericardium: There is no evidence of pericardial effusion. Mitral Valve: The mitral valve is normal in structure. No evidence of mitral valve regurgitation. Tricuspid Valve: The tricuspid valve is normal in structure. Tricuspid valve regurgitation is trivial. Aortic Valve: The aortic valve was not well visualized. Aortic valve regurgitation is severe. No aortic stenosis is present. Aortic valve mean gradient measures 8.0 mmHg. Aortic valve peak gradient measures 13.8 mmHg. Aortic valve area, by  VTI measures 3.72 cm. Pulmonic Valve: The pulmonic valve was not well visualized. Pulmonic valve regurgitation is not visualized. Aorta: The aortic root is normal in size and structure. IAS/Shunts: The interatrial septum was not well visualized.  LEFT VENTRICLE PLAX 2D LVOT diam:     2.40 cm  Diastology LV SV:         117      LV e' medial:    9.68 cm/s LV SV Index:   45       LV E/e' medial:  6.6 LVOT Area:     4.52 cm LV e' lateral:   11.50 cm/s                         LV E/e' lateral: 5.6  RIGHT VENTRICLE             IVC RV Basal diam:  3.90 cm     IVC diam: 1.50 cm RV S prime:     15.90 cm/s TAPSE (M-mode): 2.5 cm LEFT ATRIUM             Index       RIGHT ATRIUM          Index LA diam:        3.80 cm 1.47 cm/m  RA Area:     9.47 cm LA Vol (A2C):   40.4 ml 15.64 ml/m RA Volume:   18.60 ml 7.20 ml/m LA Vol (A4C):   50.1 ml 19.40 ml/m LA Biplane Vol: 49.3 ml 19.09 ml/m  AORTIC VALVE AV Area (Vmax):    4.21 cm AV Area (Vmean):   3.53 cm AV Area (VTI):     3.72 cm AV Vmax:           186.00 cm/s AV Vmean:          137.000 cm/s AV VTI:            0.315 m AV Peak Grad:      13.8 mmHg AV Mean Grad:      8.0 mmHg LVOT Vmax:         173.00 cm/s LVOT Vmean:        107.000 cm/s LVOT VTI:          0.259 m LVOT/AV VTI ratio: 0.82  AORTA Ao Root diam: 3.40 cm MITRAL VALVE MV Area (PHT): 5.13 cm    SHUNTS MV Decel Time: 148 msec    Systemic VTI:  0.26 m MV E velocity: 64.00 cm/s  Systemic Diam: 2.40 cm MV A velocity: 47.50 cm/s MV E/A ratio:  1.35 Oswaldo Milian MD Electronically signed by Oswaldo Milian MD Signature Date/Time: 07/19/2020/11:54:58 AM    Final (  Updated)    ECHO TEE  Result Date: 07/27/2020    TRANSESOPHOGEAL ECHO REPORT   Patient Name:   Alex [name] Date of Exam: 07/26/2020 Medical Rec #:  [id number]         Height:       76.0 in Accession #:    [id number]        Weight:       265.7 lb Date of Birth:  Sep 24, 1980         BSA:          2.499 m Patient Age:    39 years          BP:            116/49 mmHg Patient Gender: M                 HR:           94 bpm. Exam Location:  Inpatient Procedure: 3D Echo, Transesophageal Echo, Cardiac Doppler and Color Doppler Indications:     Aortic endocarditis.  History:         Patient has prior history of Echocardiogram examinations.                  Aortic Valve Disease; Signs/Symptoms:Hypotension. Endocarditis.                  Polysubstance abuse. Shock.  Sonographer:     Roseanna Rainbow RDCS Referring Phys:  [id number] [facility] A Gasper Sells Diagnosing Phys: Rudean Haskell MD PROCEDURE: After discussion of the risks and benefits of a TEE, an informed consent was obtained from a family member. The patient was intubated. The transesophogeal probe was passed without difficulty through the esophogus of the patient. Imaged were obtained with the patient in a supine position. Sedation performed by performing physician. The patient developed no complications during the procedure. IMPRESSIONS  1. 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  2. Type 1d Aortic Regurgitation: Perforated left coronary cusp with severe, eccentric aortic regurgitation. 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 . The aortic valve is abnormal. Aortic valve regurgitation is severe.  3. Left ventricular ejection fraction, by estimation, is 60 to 65%. The left ventricle has normal function. The left ventricle has no regional wall motion abnormalities.  4. Right ventricular systolic function is normal. The right ventricular size is normal.  5. No left atrial/left atrial appendage thrombus was detected.  6. The mitral valve is normal in structure. No evidence of mitral valve regurgitation. Comparison(s): A prior study was performed on 07/20/2020. New aortic abscess. Notified CT surgery, ICU team, and family post procedure. FINDINGS  Left Ventricle: Left ventricular ejection fraction, by estimation, is 60 to  65%. The left ventricle has normal function. The left ventricle has no regional wall motion abnormalities. The left ventricular internal cavity size was normal in size. There is  no left ventricular hypertrophy. Right Ventricle: The right ventricular size is normal. No increase in right ventricular wall thickness. Right ventricular systolic function is normal. Left Atrium: Left atrial size was normal in size. No left atrial/left atrial appendage thrombus was detected. Right Atrium: Right atrial size was normal in size. Pericardium: Trivial pericardial effusion is present. Mitral Valve: The mitral valve is normal in structure. No evidence of mitral valve regurgitation. Tricuspid Valve: The tricuspid valve is normal in structure. Tricuspid valve regurgitation is not demonstrated.  Aortic Valve: Type 1d Aortic Regurgitation: Perforated left coronary cusp with severe, eccentric aortic regurgitation. Large 36 x 14 mm mobile vegetation on ventricular surface of left coronary cusp. The aortic valve is abnormal. Aortic valve regurgitation is severe. Pulmonic Valve: The pulmonic valve was grossly normal. Pulmonic valve regurgitation is trivial. 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 The ascending aorta was not well visualized. IAS/Shunts: The atrial septum is grossly normal. Rudean Haskell MD Electronically signed by Rudean Haskell MD Signature Date/Time: 07/27/2020/7:48:35 AM    Final    ECHO TEE  Result Date: 07/20/2020    TRANSESOPHOGEAL ECHO REPORT   Patient Name:   Alex [name] Date of Exam: 07/20/2020 Medical Rec #:  [id number]         Height:       76.0 in Accession #:    [id number]        Weight:       282.4 lb Date of Birth:  1980-09-02         BSA:          2.565 m Patient Age:    39 years          BP:           130/48 mmHg Patient Gender: M                 HR:           90 bpm. Exam Location:  Inpatient Portions of this table do not  appear on this page. Procedure: Transesophageal Echo, 3D Echo, Cardiac Doppler and Color Doppler Indications:    Bacteremia 790.7 / R78.81  History:        Patient has prior history of Echocardiogram examinations, most                 recent 07/19/2020. Polysubstance abuse.  Sonographer:    Darlina Sicilian RDCS Referring Phys: [phone] JILL D MCDANIEL PROCEDURE: After discussion of the risks and benefits of a TEE, an informed consent was obtained from a family member. The transesophogeal probe was passed without difficulty through the esophogus of the patient. Sedation performed by performing physician. Patients was under conscious sedation during this procedure. Image quality was excellent. The patient's vital signs; including heart rate, blood pressure, and oxygen saturation; remained stable throughout the procedure. The patient developed no complications during the procedure. Intubated, ETT in place. IMPRESSIONS  1. Left ventricular ejection fraction, by estimation, is 60 to 65%. The left ventricle has normal function. The left ventricle has no regional wall motion abnormalities.  2. Right ventricular systolic function is normal. The right ventricular size is normal.  3. No left atrial/left atrial appendage thrombus was detected.  4. The mitral valve is normal in structure. No evidence of mitral valve regurgitation.  5. Perforated left coronary cusp with severe aortic regurgitation. Large 36 x 23m mobile vegetation on ventricular surface of left coronary cusp. The aortic valve is abnormal. Aortic valve regurgitation is severe.  6. Intubated, ETT in place. 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 FINDINGS  Left Ventricle: Left ventricular ejection fraction, by estimation, is 60 to 65%. The left ventricle has normal function. The left ventricle has no regional wall motion abnormalities. The left ventricular internal cavity size was normal in size. Right Ventricle: The  right ventricular size is normal. No increase in right ventricular wall thickness. Right ventricular systolic function is normal. Left Atrium: Left atrial  size was normal in size. No left atrial/left atrial appendage thrombus was detected. Right Atrium: Right atrial size was normal in size. Pericardium: There is no evidence of pericardial effusion. Mitral Valve: The mitral valve is normal in structure. No evidence of mitral valve regurgitation. Tricuspid Valve: The tricuspid valve is normal in structure. Tricuspid valve regurgitation is not demonstrated. Aortic Valve: Perforated left coronary cusp with severe aortic regurgitation. Large 36 x 9m mobile vegetation on ventricular surface of left coronary cusp. The aortic valve is abnormal. Aortic valve regurgitation is severe. Aortic valve mean gradient measures 2.0 mmHg. Aortic valve peak gradient measures 3.2 mmHg. Aortic valve area, by VTI measures 9.21 cm. A mobile vegetation is seen. The AoV vegetation measures 30 mm x 15 mm. Pulmonic Valve: The pulmonic valve was normal in structure. Pulmonic valve regurgitation is not visualized. Aorta: The aortic root was not well visualized. Venous: The inferior vena cava was not well visualized. IAS/Shunts: No atrial level shunt detected by color flow Doppler.  LEFT VENTRICLE PLAX 2D LVOT diam:     2.70 cm LV SV:         148 LV SV Index:   58 LVOT Area:     5.73 cm  AORTIC VALVE AV Area (Vmax):    11.08 cm AV Area (Vmean):   8.97 cm AV Area (VTI):     9.21 cm AV Vmax:           89.40 cm/s AV Vmean:          68.300 cm/s AV VTI:            0.161 m AV Peak Grad:      3.2 mmHg AV Mean Grad:      2.0 mmHg LVOT Vmax:         173.00 cm/s LVOT Vmean:        107.000 cm/s LVOT VTI:          0.259 m LVOT/AV VTI ratio: 1.61  SHUNTS Systemic VTI:  0.26 m Systemic Diam: 2.70 cm MCandee FurbishMD Electronically signed by MCandee FurbishMD Signature Date/Time: 07/20/2020/1:43:49 PM    Final    VAS [place]  Result Date:  07/26/2020 Carotid Arterial Duplex Study Patient Name:  Alex [name]  Date of Exam:   07/24/2020 Medical Rec #: [id number]         Accession #:    [id number] of Birth: [birth year]         Patient Gender: M Patient Age:   39Y [facility] [place] Referring Phys: [id number] Hawking--------------------------------------------------------------------------------  Indications:       CVA. Risk Factors:      Current smoker. Other Factors:     Endocarditis, sepsis, IV drug abuse. Limitations        Today's exam was limited due to Dialysis acces in left neck,                    ventilation. Comparison Study:  No prior study on file Performing Technologist: CSharion DoveRVS  Examination Guidelines: A complete evaluation includes B-mode imaging, spectral Doppler, color Doppler, and power Doppler as needed of all accessible portions of each vessel. Bilateral testing is considered an integral part of a complete examination. Limited examinations for reoccurring indications may be performed as noted.  Right Carotid Findings: +----------+--------+--------+--------+------------------+--------+           PSV cm/sEDV cm/sStenosisPlaque  DescriptionComments +----------+--------+--------+--------+------------------+--------+ CCA Prox  252     2                                          +----------+--------+--------+--------+------------------+--------+ CCA Distal271     3                                          +----------+--------+--------+--------+------------------+--------+ ICA Prox  118     19                                         +----------+--------+--------+--------+------------------+--------+ ICA Distal151     25                                         +----------+--------+--------+--------+------------------+--------+ +----------+--------+-------+--------+-------------------+           PSV cm/sEDV cmsDescribeArm Pressure (mmHG)  +----------+--------+-------+--------+-------------------+ Subclavian101                                        +----------+--------+-------+--------+-------------------+ +---------+--------+---+--------+--+ VertebralPSV cm/s112EDV cm/s11 +---------+--------+---+--------+--+  Left Carotid Findings: +----------+--------+--------+--------+-------------------+           PSV cm/sEDV cm/sDescribeArm Pressure (mmHG) +----------+--------+--------+--------+-------------------+ [id number]                                         +----------+--------+--------+--------+-------------------+   Summary: Right Carotid: There was no evidence of thrombus, dissection, atherosclerotic                plaque or stenosis in the cervical carotid system. Left Carotid: Unable to insonate secondary to Dialysis access in left jugular. Vertebrals:  Right vertebral artery demonstrates antegrade flow. Subclavians: Normal flow hemodynamics were seen in bilateral subclavian              arteries. *See table(s) above for measurements and observations.  Electronically signed by Antony Contras MD on 07/26/2020 at 8:29:22 AM.    Final    VAS [place] LOWER EXTREMITY VENOUS (DVT)  Result Date: 07/25/2020  Lower Venous DVT Study Patient Name:  Alex [name]  Date of Exam:   07/25/2020 Medical Rec #: [id number]          Accession #:    [id number] Date of Birth: 1980/09/04          Patient Gender: M Patient Age:   39Y Exam Location:  [facility] Procedure:      VAS [place] LOWER EXTREMITY VENOUS (DVT) Referring Phys: [id number] [place] --------------------------------------------------------------------------------  Indications: Edema.  Comparison Study: no prior Performing Technologist: Abram Sander RVS  Examination Guidelines: A complete evaluation includes B-mode imaging, spectral Doppler, color Doppler, and power Doppler as needed of all accessible portions of each vessel. Bilateral testing is considered an integral part of a  complete examination. Limited examinations for reoccurring indications may be performed as noted. The reflux portion of the exam is performed with the patient in reverse Trendelenburg.  +---------+---------------+---------+-----------+----------+--------------+  RIGHT    CompressibilityPhasicitySpontaneityPropertiesThrombus Aging +---------+---------------+---------+-----------+----------+--------------+ CFV      Full           Yes      Yes                                 +---------+---------------+---------+-----------+----------+--------------+ SFJ      Full                                                        +---------+---------------+---------+-----------+----------+--------------+ FV Prox  Full                                                        +---------+---------------+---------+-----------+----------+--------------+ FV Mid   Full                                                        +---------+---------------+---------+-----------+----------+--------------+ FV DistalFull                                                        +---------+---------------+---------+-----------+----------+--------------+ PFV      Full                                                        +---------+---------------+---------+-----------+----------+--------------+ POP      Full           Yes      Yes                                 +---------+---------------+---------+-----------+----------+--------------+ PTV      Full                                                        +---------+---------------+---------+-----------+----------+--------------+ PERO     Full                                                        +---------+---------------+---------+-----------+----------+--------------+   +----+---------------+---------+-----------+----------+--------------+ LEFTCompressibilityPhasicitySpontaneityPropertiesThrombus Aging  +----+---------------+---------+-----------+----------+--------------+ CFV Full           Yes      Yes                                 +----+---------------+---------+-----------+----------+--------------+  Summary: RIGHT: - There is no evidence of deep vein thrombosis in the lower extremity.  - No cystic structure found in the popliteal fossa.  LEFT: - No evidence of common femoral vein obstruction.  *See table(s) above for measurements and observations. Electronically signed by Curt Jews MD on 07/25/2020 at 8:14:08 PM.    Final     Labs:  CBC: Recent Labs    07/24/20 0900 07/25/20 0333 07/26/20 0331 07/27/20 0413  WBC 18.2* 22.0* 17.1* 18.1*  HGB 8.5* 8.6* 7.9* 8.2*  HCT 27.6* 26.9* 25.2* 26.2*  PLT 63* PLATELET CLUMPS NOTED ON SMEAR, UNABLE TO ESTIMATE 129* 191    COAGS: Recent Labs    07/18/20 1351  INR 1.4*  APTT 33    BMP: Recent Labs    07/25/20 1542 07/26/20 0331 07/26/20 1712 07/27/20 0413  NA 131* 133* 132* 130*  K 5.6* 5.1 5.6* 4.8  CL 98 99 99 96*  CO2 '24 26 23 25  '$ GLUCOSE 95 120* 82 123*  BUN 69* 65* 69* 52*  CALCIUM 8.5* 8.4* 8.5* 8.7*  CREATININE 3.14* 2.68* 2.85* 2.16*  GFRNONAA 25* 30* 28* 39*    LIVER FUNCTION TESTS: Recent Labs    07/18/20 1135 07/18/20 1351 07/19/20 0140 07/20/20 0246 07/20/20 2032 07/25/20 1542 07/26/20 0331 07/26/20 1712 07/27/20 0413  BILITOT 5.4* 6.0* 7.0* 8.1*  --   --   --   --   --   AST 53* 58* 51* 44*  --   --   --   --   --   ALT '24 25 23 21  '$ --   --   --   --   --   ALKPHOS 593* 569* 326* 179*  --   --   --   --   --   PROT 5.8* 6.0* 5.8* 6.0*  --   --   --   --   --   ALBUMIN 1.9* 1.9* 1.6* 1.6*   < > 1.9* 1.8* 1.9* 2.1*   < > = values in this interval not displayed.    TUMOR MARKERS: No results for input(s): AFPTM, CEA, CA199, CHROMGRNA in the last 8760 hours.  Assessment and Plan: Acute kidney injury  Patient admitted with sepsis and multisystem organ failure due to Serratia bacteremia.   Required mechanical ventilation as well as CRRT.  He was extubated earlier and subsequently pulled his temporary dialysis catheter.  IR consulted for tunneled dialysis catheter.  Currently NPO while on Bipap.  Mother at bedside provides consent.   Risks and benefits discussed with the patient's family including, but not limited to bleeding, infection, vascular injury, pneumothorax which may require chest tube placement, air embolism or even death  All of the patient's family's questions were answered, patient is agreeable to proceed. Consent signed and in chart.   Thank you for this interesting consult.  I greatly enjoyed meeting Alex [name] and look forward to participating in their care.  A copy of this report was sent to the requesting provider on this date.  Electronically Signed: Docia Barrier, PA 07/27/2020, 12:25 PM   I spent a total of 40 Minutes   in face to face in clinical consultation, greater than 50% of which was counseling/coordinating care for acute kidney injury

## 2020-07-27 NOTE — Plan of Care (Signed)
Trying to get out of bed despite bed alarm, redirection, frequent rounding. Very impulsive. Needs 1:1 sitter.  Julian Hy, DO 07/27/20 2:43 PM  Pulmonary & Critical Care

## 2020-07-27 NOTE — Progress Notes (Signed)
[facility] NEPHROLOGY PROGRESS NOTE  Assessment/ Plan: Pt is a 40 y.o. yo male with history of substance abuse, ruptured disc who was initially presented to ER with altered mental status found to be septic shock and Serratia bacteremia/AV endocarditis seen as a consultation for AKI requiring CRRT.  #Anuric acute kidney injury likely ischemic   ATN in the setting of septic shock/septic emboli.  Kidney ultrasound without hydronephrosis.   Started CRRT on 4/28-->5/5.    Temp HD cath removed 5/5 by patient  Plan to transition to [facility] tomorrow: 3h, 2K, 1-2L UF, no heparin  Most recent BCx neg, Afebrile, on ABX, but does have stable leukocytosis; will see if IR willing to place [facility]  If not, will req another temp HD cath  PM BMP  #Serratia more concerns bacteremia/AV endocarditis: With history of substance abuse.  Seen by ID, on IV cefepime.  TCTS is following for possible AVR  #Septic shock, multiorgan failure: Off pressors, per CCM, as above.  #Vent dependent respiratory failure managed by PCCM. Extubated 5/5  #Acute encephalopathy due to sepsis/septic emboli: Monitor mental status.  #Thrombocytopenia and anemia probably because of sepsis and acute illness.  Monitor lab.  Subjective:   Seen and examined in ICU.    Remains anuric.   Agitated  Extubated yesterday  Pt removed HD catheter this AM  Off CRRT currently  Objective Vital signs in last 24 hours: Vitals:   07/27/20 0933 07/27/20 0943 07/27/20 1000 07/27/20 1030  BP: (!) 52/22 111/63 (!) 115/58 (!) 122/59  Pulse: (!) 115 (!) 105 (!) 105 (!) 104  Resp:  17 16 16   Temp:      TempSrc:      SpO2: (!) 89% 99% 100% 100%  Weight:      Height:       Weight change: -1.1 kg  Intake/Output Summary (Last 24 hours) at 07/27/2020 1054 Last data filed at 07/27/2020 1000 Gross per 24 hour  Intake 2156.34 ml  Output 4606 ml  Net -2449.66 ml     Labs: Basic Metabolic Panel: Recent Labs  Lab  07/26/20 0331 07/26/20 1712 07/27/20 0413  NA 133* 132* 130*  K 5.1 5.6* 4.8  CL 99 99 96*  CO2 26 23 25   GLUCOSE 120* 82 123*  BUN 65* 69* 52*  CREATININE 2.68* 2.85* 2.16*  CALCIUM 8.4* 8.5* 8.7*  PHOS 4.8* 5.8* 5.5*   Liver Function Tests: Recent Labs  Lab 07/26/20 0331 07/26/20 1712 07/27/20 0413  ALBUMIN 1.8* 1.9* 2.1*   No results for input(s): LIPASE, AMYLASE in the last 168 hours. No results for input(s): AMMONIA in the last 168 hours. CBC: Recent Labs  Lab 07/21/20 0702 07/21/20 1600 07/23/20 0346 07/24/20 0900 07/25/20 0333 07/26/20 0331 07/27/20 0413  WBC 15.4*   < > 17.3* 18.2* 22.0* 17.1* 18.1*  NEUTROABS 11.2*  --   --   --   --   --   --   HGB 9.4*   < > 7.5* 8.5* 8.6* 7.9* 8.2*  HCT 30.2*   < > 23.8* 27.6* 26.9* 25.2* 26.2*  MCV 87.0   < > 86.9 87.9 86.2 87.5 89.1  PLT 14*   < > 37* 63* PLATELET CLUMPS NOTED ON SMEAR, UNABLE TO ESTIMATE 129* 191   < > = values in this interval not displayed.   Cardiac Enzymes: No results for input(s): CKTOTAL, CKMB, CKMBINDEX, TROPONINI in the last 168 hours. CBG: Recent Labs  Lab 07/26/20 1950 07/26/20 2336 07/26/20 2340  07/27/20 0359 07/27/20 0743  GLUCAP 102* 79 92 112* 109*    Iron Studies:  No results for input(s): IRON, TIBC, TRANSFERRIN, FERRITIN in the last 72 hours. Studies/Results: MR ANGIO HEAD WO CONTRAST  Result Date: 07/25/2020 CLINICAL DATA:  Stroke, follow-up. EXAM: MRA HEAD WITHOUT CONTRAST TECHNIQUE: Angiographic images of the Circle of Willis were obtained using MRA technique without intravenous contrast. COMPARISON:  Brain MRI 07/22/2020. FINDINGS: Moderate to moderately severe motion degradation, limiting evaluation. This precludes adequate evaluation for arterial stenoses. This also significantly limits evaluation for intracranial aneurysms. The intracranial internal carotid arteries, middle cerebral arteries and anterior cerebral arteries are patent without appreciable proximal large  vessel occlusion. The vertebral arteries, basilar artery and posterior cerebral arteries are patent without appreciable proximal large vessel occlusion. Apparent moderate stenosis within the right posterior cerebral artery at the P1/P2 junction (series 2, image 83). Posterior communicating arteries are hypoplastic or absent bilaterally. IMPRESSION: No evidence of intracranial proximal large vessel occlusion. Significant motion degradation, precluding adequate evaluation for intracranial arterial stenosis. Apparent moderate stenosis within the right posterior cerebral artery at the P1/P2 junction. Electronically Signed   By: Kellie Simmering DO   On: 07/25/2020 13:19   MR ANGIO NECK WO CONTRAST  Result Date: 07/25/2020 CLINICAL DATA:  Provided history: Septic arterial embolism. EXAM: MRA NECK WITHOUT CONTRAST TECHNIQUE: Multiplanar and multiecho pulse sequences of the neck were obtained without intravenous contrast. Angiographic images of the neck were obtained using MRA technique without intravenous contrast. COMPARISON:  Brain MRI 07/22/2020. FINDINGS: The examination is moderately motion degraded, limiting evaluation. Additionally, the origins of the left common carotid and left vertebral arteries are excluded from the field of view. The visible common carotid and internal carotid arteries are patent without appreciable hemodynamically significant stenosis. Tortuosity of the distal cervical ICAs bilaterally. The visible vertebral arteries are patent within the neck with antegrade flow. Left vertebral artery dominant. Apparent moderate/severe stenosis at the origin of the non dominant right vertebral artery. Additionally, there is an apparent moderate stenosis within the right vertebral artery at the V1/V2 junction. However, these apparent stenoses could potentially be exaggerate by motion artifact and noncontrast technique. IMPRESSION: The examination is moderately motion degraded, limiting evaluation.  Additionally, the origins of the left common carotid and left vertebral arteries are excluded from the field of view. The visible common carotid and internal carotid arteries are patent within the neck without appreciable hemodynamically significant stenosis. The visible vertebral arteries are patent within the neck with antegrade flow. Apparent stenoses within the non-dominant right vertebral artery at the vessel origin (moderate/severe), and at the V1/V2 junction (moderate). However, these apparent stenoses could potentially be exaggerated by motion artifact and non-contrast technique. Electronically Signed   By: Kellie Simmering DO   On: 07/25/2020 13:10   ECHO TEE  Result Date: 07/27/2020    TRANSESOPHOGEAL ECHO REPORT   Patient Name:   Alex [name] Date of Exam: 07/26/2020 Medical Rec #:  [id number]         Height:       76.0 in Accession #:    [id number]        Weight:       265.7 lb Date of Birth:  [birth year]         BSA:          2.499 m Patient Age:    39 years          BP:           116/49 mmHg  Patient Gender: M                 HR:           94 bpm. Exam Location:  Inpatient Procedure: 3D Echo, Transesophageal Echo, Cardiac Doppler and Color Doppler Indications:     Aortic endocarditis.  History:         Patient has prior history of Echocardiogram examinations.                  Aortic Valve Disease; Signs/Symptoms:Hypotension. Endocarditis.                  Polysubstance abuse. Shock.  Sonographer:     Roseanna Rainbow RDCS Referring Phys:  [id number] [facility] A Gasper Sells Diagnosing Phys: Rudean Haskell MD PROCEDURE: After discussion of the risks and benefits of a TEE, an informed consent was obtained from a family member. The patient was intubated. The transesophogeal probe was passed without difficulty through the esophogus of the patient. Imaged were obtained with the patient in a supine position. Sedation performed by performing physician. The patient developed no complications during the procedure.  IMPRESSIONS  1. 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  2. Type 1d Aortic Regurgitation: Perforated left coronary cusp with severe, eccentric aortic regurgitation. 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 . The aortic valve is abnormal. Aortic valve regurgitation is severe.  3. Left ventricular ejection fraction, by estimation, is 60 to 65%. The left ventricle has normal function. The left ventricle has no regional wall motion abnormalities.  4. Right ventricular systolic function is normal. The right ventricular size is normal.  5. No left atrial/left atrial appendage thrombus was detected.  6. The mitral valve is normal in structure. No evidence of mitral valve regurgitation. Comparison(s): A prior study was performed on 07/20/2020. New aortic abscess. Notified CT surgery, ICU team, and family post procedure. FINDINGS  Left Ventricle: Left ventricular ejection fraction, by estimation, is 60 to 65%. The left ventricle has normal function. The left ventricle has no regional wall motion abnormalities. The left ventricular internal cavity size was normal in size. There is  no left ventricular hypertrophy. Right Ventricle: The right ventricular size is normal. No increase in right ventricular wall thickness. Right ventricular systolic function is normal. Left Atrium: Left atrial size was normal in size. No left atrial/left atrial appendage thrombus was detected. Right Atrium: Right atrial size was normal in size. Pericardium: Trivial pericardial effusion is present. Mitral Valve: The mitral valve is normal in structure. No evidence of mitral valve regurgitation. Tricuspid Valve: The tricuspid valve is normal in structure. Tricuspid valve regurgitation is not demonstrated. Aortic Valve: Type 1d Aortic Regurgitation: Perforated left coronary cusp with severe, eccentric aortic regurgitation. Large 36 x 14 mm mobile vegetation  on ventricular surface of left coronary cusp. The aortic valve is abnormal. Aortic valve regurgitation is severe. Pulmonic Valve: The pulmonic valve was grossly normal. Pulmonic valve regurgitation is trivial. 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 The ascending aorta was not well visualized. IAS/Shunts: The atrial septum is grossly normal. Rudean Haskell MD Electronically signed by Rudean Haskell MD Signature Date/Time: 07/27/2020/7:48:35 AM    Final    VAS [place] LOWER EXTREMITY VENOUS (DVT)  Result Date: 07/25/2020  Lower Venous DVT Study Patient Name:  Alex [name]  Date of  Exam:   07/25/2020 Medical Rec #: [id number]          Accession #:    [id number] Date of Birth: Aug 15, 1980          Patient Gender: M Patient Age:   40Y Exam Location:  [facility] Procedure:      VAS [place] LOWER EXTREMITY VENOUS (DVT) Referring Phys: [id number] Frost --------------------------------------------------------------------------------  Indications: Edema.  Comparison Study: no prior Performing Technologist: Abram Sander RVS  Examination Guidelines: A complete evaluation includes B-mode imaging, spectral Doppler, color Doppler, and power Doppler as needed of all accessible portions of each vessel. Bilateral testing is considered an integral part of a complete examination. Limited examinations for reoccurring indications may be performed as noted. The reflux portion of the exam is performed with the patient in reverse Trendelenburg.  +---------+---------------+---------+-----------+----------+--------------+ RIGHT    CompressibilityPhasicitySpontaneityPropertiesThrombus Aging +---------+---------------+---------+-----------+----------+--------------+ CFV      Full           Yes      Yes                                 +---------+---------------+---------+-----------+----------+--------------+ SFJ      Full                                                         +---------+---------------+---------+-----------+----------+--------------+ FV Prox  Full                                                        +---------+---------------+---------+-----------+----------+--------------+ FV Mid   Full                                                        +---------+---------------+---------+-----------+----------+--------------+ FV DistalFull                                                        +---------+---------------+---------+-----------+----------+--------------+ PFV      Full                                                        +---------+---------------+---------+-----------+----------+--------------+ POP      Full           Yes      Yes                                 +---------+---------------+---------+-----------+----------+--------------+ PTV      Full                                                        +---------+---------------+---------+-----------+----------+--------------+  PERO     Full                                                        +---------+---------------+---------+-----------+----------+--------------+   +----+---------------+---------+-----------+----------+--------------+ LEFTCompressibilityPhasicitySpontaneityPropertiesThrombus Aging +----+---------------+---------+-----------+----------+--------------+ CFV Full           Yes      Yes                                 +----+---------------+---------+-----------+----------+--------------+     Summary: RIGHT: - There is no evidence of deep vein thrombosis in the lower extremity.  - No cystic structure found in the popliteal fossa.  LEFT: - No evidence of common femoral vein obstruction.  *See table(s) above for measurements and observations. Electronically signed by Curt Jews MD on 07/25/2020 at 8:14:08 PM.    Final     Medications: Infusions: . sodium chloride Stopped (07/27/20 0907)  . [START ON  07/28/2020] ceFEPime (MAXIPIME) IV    . feeding supplement (PIVOT 1.5 CAL) Stopped (07/27/20 0800)  . fentaNYL infusion INTRAVENOUS Stopped (07/27/20 0759)  . norepinephrine (LEVOPHED) Adult infusion Stopped (07/26/20 1650)  . propofol (DIPRIVAN) infusion Stopped (07/27/20 0759)    Scheduled Medications: . B-complex with vitamin C  1 tablet Per Tube Daily  . chlorhexidine gluconate (MEDLINE KIT)  15 mL Mouth Rinse BID  . Chlorhexidine Gluconate Cloth  6 each Topical Daily  . clonazePAM  1 mg Per Tube BID  . docusate  100 mg Per Tube BID  . feeding supplement (PROSource TF)  45 mL Per Tube QID  . heparin injection (subcutaneous)  5,000 Units Subcutaneous Q8H  . mouth rinse  15 mL Mouth Rinse 10 times per day  . pantoprazole sodium  40 mg Per Tube Daily  . polyethylene glycol  17 g Per Tube Daily  . QUEtiapine  25 mg Per Tube BID  . sodium chloride flush  10-40 mL Intracatheter Q12H    have reviewed scheduled and prn medications.  Physical Exam: General: Critically ill looking male intubated, sedated Heart:RRR, s1s2 nl Lungs: Coarse breath sound bilateral Abdomen:soft, Non-tender, non-distended Extremities:  LE edema+ Dialysis Access: IJ temporary HD catheter placed by ICU on 4/28  Maggi Hershkowitz B Thom Ollinger 07/27/2020,10:54 AM  LOS: 9 days

## 2020-07-27 NOTE — Evaluation (Signed)
Physical Therapy Evaluation Patient Details Name: Alex [name] MRN: [id number] DOB: 05/28/1980 Today's Date: 07/27/2020   History of Present Illness  40 yo male smoker brought to [facility] ER 07/18/20 with altered mental status. Found to have hypotension, AKI, thrombocytopenia, splenomegaly, lactic acidosis, elevated bilirubin, hypoglycemia, polysubstance abuse. Treated for sepsis Intubated 4/28-5/5. CRRT initiated 4/28, discontinued when pt pulled out catheter on 5/5; TEE found endocarditis with aortic valve perforation; MRI brain showed Dozens of primarily punctate acute infarctions scattered throughout the cerebellum and both cerebral hemispheres consistent with microembolic infarctions, consistent with endocarditis  Clinical Impression   Pt admitted secondary to problem above with deficits below. PTA patient was independent with mobility and living with mother and brother. Pt currently requires min assist of 2 people due to his impulsivity, generalized weakness, and size (6'4"). He was removing his leads for telemetry throughout evaluation despite instructions and explanations to leave then in place. Patient on BiPAP during evaluation, therefore limited in ambulation, however anticipate he will make quick progress and not need follow-up PT on discharge.  Will continue to follow acutely to maximize functional mobility independence and safety.       Follow Up Recommendations No PT follow up    Equipment Recommendations  None recommended by PT    Recommendations for Other Services       Precautions / Restrictions Precautions Precautions: Fall Precaution Comments: impulsivity; pt is 6'4"      Mobility  Bed Mobility Overal bed mobility: Needs Assistance Bed Mobility: Rolling;Supine to Sit;Sit to Supine Rolling: +2 for safety/equipment;Min guard (use of rail)   Supine to sit: Mod assist;+2 for safety/equipment Sit to supine: Min assist;+2 for safety/equipment   General bed mobility  comments: rolled for pericare and changing linens, assist for LEs over EOB and to raise trunk, min assist for LEs back into bed, impaired sequencing    Transfers Overall transfer level: Needs assistance Equipment used: 2 person hand held assist Transfers: Sit to/from Stand Sit to Stand: +2 physical assistance;Min assist         General transfer comment: assist to rise and steady, pt took several side steps to [facility]  Ambulation/Gait Ambulation/Gait assistance: Min assist;+2 physical assistance Gait Distance (Feet): 2 Feet Assistive device: 2 person hand held assist Gait Pattern/deviations: Step-to pattern     General Gait Details: side step to his right--on BiPAP and could not walk any farther  Stairs            Wheelchair Mobility    Modified Rankin (Stroke Patients Only) Modified Rankin (Stroke Patients Only) Pre-Morbid Rankin Score: No symptoms Modified Rankin: Moderately severe disability     Balance Overall balance assessment: Needs assistance   Sitting balance-Leahy Scale: Fair     Standing balance support: Bilateral upper extremity supported Standing balance-Leahy Scale: Poor                               Pertinent Vitals/Pain Pain Assessment: Faces Faces Pain Scale: No hurt    Home Living Family/patient expects to be discharged to:: Private residence Living Arrangements: Parent;Other relatives (brother) Available Help at Discharge: Family;Available 24 hours/day Type of Home: Mobile home Home Access: Stairs to enter   Entrance Stairs-Number of Steps: 1 Home Layout: One level Home Equipment: None      Prior Function Level of Independence: Independent               Hand Dominance   Dominant Hand:  Right    Extremity/Trunk Assessment   Upper Extremity Assessment Upper Extremity Assessment: Overall WFL for tasks assessed    Lower Extremity Assessment Lower Extremity Assessment: Overall WFL for tasks assessed     Cervical / Trunk Assessment Cervical / Trunk Assessment: Normal  Communication   Communication: No difficulties  Cognition Arousal/Alertness: Awake/alert Behavior During Therapy: Impulsive;Restless Overall Cognitive Status: Impaired/Different from baseline Area of Impairment: Orientation;Attention;Memory;Following commands;Safety/judgement;Awareness;Problem solving                 Orientation Level: Disoriented to;Situation;Place Current Attention Level: Sustained Memory: Decreased short-term memory;Decreased recall of precautions Following Commands: Follows one step commands with increased time;Follows one step commands inconsistently Safety/Judgement: Decreased awareness of safety;Decreased awareness of deficits Awareness: Intellectual Problem Solving: Decreased initiation;Difficulty sequencing;Requires verbal cues;Slow processing General Comments: pt pulling at lines, on bipap until end of session when RT removed and place pt on [place]      General Comments      Exercises     Assessment/Plan    PT Assessment Patient needs continued PT services  PT Problem List Decreased activity tolerance;Decreased balance;Decreased mobility;Decreased cognition;Decreased knowledge of use of DME;Decreased safety awareness;Decreased knowledge of precautions;Cardiopulmonary status limiting activity;Obesity       PT Treatment Interventions DME instruction;Gait training;Stair training;Functional mobility training;Therapeutic activities;Therapeutic exercise;Balance training;Cognitive remediation;Patient/family education    PT Goals (Current goals can be found in the Care Plan section)  Acute Rehab PT Goals Patient Stated Goal: to drink PT Goal Formulation: With patient Time For Goal Achievement: 08/10/20 Potential to Achieve Goals: Good    Frequency Min 3X/week   Barriers to discharge Other (comment) need for IV antibiotics for ?6 weeks post-surgery    Co-evaluation PT/OT/SLP  Co-Evaluation/Treatment: Yes Reason for Co-Treatment: Complexity of the patient's impairments (multi-system involvement);For patient/therapist safety PT goals addressed during session: Mobility/safety with mobility;Balance         AM-PAC PT "6 Clicks" Mobility  Outcome Measure Help needed turning from your back to your side while in a flat bed without using bedrails?: None Help needed moving from lying on your back to sitting on the side of a flat bed without using bedrails?: A Little Help needed moving to and from a bed to a chair (including a wheelchair)?: Total Help needed standing up from a chair using your arms (e.g., wheelchair or bedside chair)?: Total Help needed to walk in hospital room?: Total Help needed climbing 3-5 steps with a railing? : Total 6 Click Score: 11    End of Session Equipment Utilized During Treatment: Oxygen (on BiPAP; RT aware and ok to work with pt) Activity Tolerance: Patient tolerated treatment well Patient left: in bed;with call bell/phone within reach;with family/visitor present;Other (comment) (Respiratory Therapist) Nurse Communication: Mobility status;Other (comment) (in chair position in bed) PT Visit Diagnosis: Unsteadiness on feet (R26.81);Other abnormalities of gait and mobility (R26.89)    Time: [id number] PT Time Calculation (min) (ACUTE ONLY): 37 min   Charges:   PT Evaluation $PT Eval Moderate Complexity: 1 Mod           Arby Barrette, PT Pager [phone]   [name] 07/27/2020, 3:41 PM

## 2020-07-27 NOTE — Procedures (Signed)
Extubation Procedure Note  Patient Details:   Name: Alex [name] DOB: [birth year] MRN: [id number]   Airway Documentation:    Vent end date: 07/27/20 Vent end time: 0840   Evaluation  O2 sats: stable throughout Complications: No apparent complications Patient did tolerate procedure well. Bilateral Breath Sounds: Clear,Diminished   Yes   Positive cuff leak noted.  Patient extubated to Bipap per Dr. Ainsley Spinner order.  Patient tolerating well at this time. No stridor noted.  Bayard Beaver 07/27/2020, 8:52 AM

## 2020-07-27 NOTE — Progress Notes (Signed)
Patient weaned to [place] 3L with humidity. No increased WOB, patient tolerating well at this time.  Family at bedside.

## 2020-07-27 NOTE — Evaluation (Signed)
Occupational Therapy Evaluation Patient Details Name: Alex [name] MRN: [id number] DOB: 04-01-1980 Today's Date: 07/27/2020    History of Present Illness 40 yo male smoker brought to [facility] ER 07/18/20 with altered mental status. Found to have hypotension, AKI, thrombocytopenia, splenomegaly, lactic acidosis, elevated bilirubin, hypoglycemia, polysubstance abuse. Treated for sepsis Intubated 4/28-5/5. 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 Presents with impulsivity and restlessness, pulling at lines during session. He demonstrates slow processing and inconsistently follows one step commands. Pt is disoriented to time and place, he is aware it of month and year. Pt requires +2 assist for all mobility and is dependent in self care. Pt supported on bipap until end of session when RT removed and placed him on [place]. Per infectious disease note, pt will need 6 weeks of IV antibiotics following his valve replacement scheduled for 5/9, recommending SNF.    Follow Up Recommendations  SNF;Supervision/Assistance - 24 hour    Equipment Recommendations  None recommended by OT    Recommendations for Other Services       Precautions / Restrictions Precautions Precautions: Fall      Mobility Bed Mobility Overal bed mobility: Needs Assistance Bed Mobility: Rolling;Supine to Sit;Sit to Supine Rolling: +2 for safety/equipment;Min guard (use of rail)   Supine to sit: Mod assist;+2 for safety/equipment Sit to supine: Min assist;+2 for safety/equipment   General bed mobility comments: rolled for pericare and changing linens,  assist for LEs over EOB and to raise trunk, min assist for LEs back into bed, impaired sequencing    Transfers Overall transfer level: Needs assistance Equipment used: 2 person hand held assist Transfers: Sit to/from Stand Sit to Stand: +2 physical assistance;Min assist         General transfer comment: assist to rise and steady, pt took several side steps to [facility]    Balance Overall balance assessment: Needs assistance   Sitting balance-Leahy Scale: Fair     Standing balance support: Bilateral upper extremity supported Standing balance-Leahy Scale: Poor                             ADL either performed or assessed with clinical judgement   ADL Overall ADL's : Needs assistance/impaired Eating/Feeding: NPO       Upper Body Bathing: Sitting;Total assistance   Lower Body Bathing: Total assistance;Sit to/from stand   Upper Body Dressing : Sitting;Total assistance   Lower Body Dressing: Total assistance;Sit to/from stand       Toileting- Water quality scientist and Hygiene: Total assistance;Bed level Toileting - Clothing Manipulation Details (indicate cue type and reason): pt aware he was soiled             Vision Baseline Vision/History: No visual deficits       Perception     Praxis      Pertinent Vitals/Pain Pain Assessment: Faces Faces Pain Scale: No hurt     Hand Dominance Right   Extremity/Trunk Assessment Upper Extremity Assessment Upper Extremity Assessment: Overall WFL for tasks assessed;Difficult to assess due to impaired cognition   Lower Extremity Assessment Lower Extremity Assessment: Defer to PT evaluation   Cervical / Trunk Assessment Cervical /  Trunk Assessment: Normal   Communication Communication Communication: No difficulties   Cognition Arousal/Alertness: Awake/alert Behavior During Therapy: Impulsive;Restless Overall Cognitive Status: Impaired/Different from baseline Area of Impairment:  Orientation;Attention;Memory;Following commands;Safety/judgement;Awareness;Problem solving                 Orientation Level: Disoriented to;Situation;Place Current Attention Level: Sustained Memory: Decreased short-term memory;Decreased recall of precautions Following Commands: Follows one step commands with increased time;Follows one step commands inconsistently Safety/Judgement: Decreased awareness of safety;Decreased awareness of deficits Awareness: Intellectual Problem Solving: Decreased initiation;Difficulty sequencing;Requires verbal cues;Slow processing General Comments: pt pulling at lines, on bipap until end of session when RT removed and place pt on [place]   General Comments       Exercises     Shoulder Instructions      Home Living Family/patient expects to be discharged to:: Private residence Living Arrangements: Parent;Other relatives (brother) Available Help at Discharge: Family;Available 24 hours/day Type of Home: Mobile home Home Access: Stairs to enter Entrance Stairs-Number of Steps: 1   Home Layout: One level     Bathroom Shower/Tub: Tub/shower unit;Walk-in shower   Bathroom Toilet: Standard     Home Equipment: None          Prior Functioning/Environment Level of Independence: Independent                 OT Problem List: Decreased activity tolerance;Impaired balance (sitting and/or standing);Decreased strength;Decreased cognition;Decreased knowledge of use of DME or AE;Cardiopulmonary status limiting activity;Decreased knowledge of precautions;Decreased safety awareness      OT Treatment/Interventions: Self-care/ADL training;DME and/or AE instruction;Cognitive remediation/compensation;Patient/family education;Balance training;Therapeutic activities    OT Goals(Current goals can be found in the care plan section) Acute Rehab OT Goals Patient Stated Goal: to drink OT Goal Formulation: With patient/family Time For Goal Achievement:  08/10/20 Potential to Achieve Goals: Good ADL Goals Pt Will Perform Eating: with supervision Pt Will Perform Grooming: with supervision;standing Pt Will Perform Upper Body Dressing: with supervision;sitting Pt Will Perform Lower Body Dressing: with supervision;sit to/from stand Pt Will Transfer to Toilet: with supervision;ambulating;regular height toilet Pt Will Perform Toileting - Clothing Manipulation and hygiene: with supervision;sit to/from stand Additional ADL Goal #1: Pt will follow one step commands consistently. Additional ADL Goal #2: Pt will perform bed mobility with supervision in preparation for ADL.  OT Frequency: Min 2X/week   Barriers to D/C:            Co-evaluation              AM-PAC OT "6 Clicks" Daily Activity     Outcome Measure Help from another person eating meals?: Total Help from another person taking care of personal grooming?: Total Help from another person toileting, which includes using toliet, bedpan, or urinal?: Total Help from another person bathing (including washing, rinsing, drying)?: Total Help from another person to put on and taking off regular upper body clothing?: Total Help from another person to put on and taking off regular lower body clothing?: Total 6 Click Score: 6   End of Session Equipment Utilized During Treatment: Other (comment) (bipap) Nurse Communication: Other (comment);Mobility status (RT removed bipap at end of session, placed pt on [place])  Activity Tolerance: Patient tolerated treatment well Patient left: in bed;with call bell/phone within reach;with bed alarm set;with family/visitor present  OT Visit Diagnosis: Unsteadiness on feet (R26.81);Other abnormalities of gait and mobility (R26.89);Muscle weakness (generalized) (M62.81)                Time: [id number] OT Time Calculation (min):  36 min Charges:  OT General Charges $OT Visit: 1 Visit OT Evaluation $OT Eval Moderate Complexity: [place],  OTR/L Acute Rehabilitation Services Pager: [phone] Office: [phone]  Malka So 07/27/2020, 1:32 PM

## 2020-07-27 NOTE — Progress Notes (Signed)
Per protocol, did bedside swallow screen, four hours after extubation, on patient and he passed.  Orders received to start Renal diet with 1L fluid restriction.  Will continue to monitor patient.

## 2020-07-27 NOTE — Progress Notes (Signed)
Pharmacy Antibiotic Note  GAD EMGE is a 40 y.o. male admitted on 07/18/2020 and found to have Serratia bacteremia with TEE showing endocarditis. Pharmacy has been consulted for Cefepime dosing.  CRRT was discontinued 5/5 AM after patient removed catheter.  Cefepime 2g IV was given this AM prior to CRRT discontinuation - will adjust dosing now that patient is not longer on CRRT.    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   WBC today stable at 18, patient remains afebrile.   Plan: - Adjust Cefepime to 1g IV q24 hours - Follow-up plans for intermittent HD and Cefepime dose adjustments - Follow-up TCTS plans  - Monitor clinical improvement, culture results, ability to de-escalate antibiotics  Height: '6\' 4"'$  (193 cm) Weight: 119.4 kg (263 lb 3.7 oz) IBW/kg (Calculated) : 86.8  Temp (24hrs), Avg:98.4 F (36.9 C), Min:97.6 F (36.4 C), Max:98.9 F (37.2 C)  Recent Labs  Lab 07/23/20 0346 07/23/20 1540 07/24/20 0900 07/24/20 1600 07/25/20 0333 07/25/20 1542 07/26/20 0331 07/26/20 1712 07/27/20 0413  WBC 17.3*  --  18.2*  --  22.0*  --  17.1*  --  18.1*  CREATININE 3.27*   < >  --    < > 2.54* 3.14* 2.68* 2.85* 2.16*   < > = values in this interval not displayed.    Estimated Creatinine Clearance: 66.1 mL/min (A) (by C-G formula based on SCr of 2.16 mg/dL (H)).    No Known Allergies  Antimicrobials this admission: Vancomycin 4/26 x 1 Cefepime 4/26 >>  Dose adjustments this admission: 5/5: Cefepime adjusted to 1g IV q24 hours starting 5/6 (received 2g on 5/5)  Microbiology results: 4/26 Flu/COVID >> neg 4/26 BCx >> Serratia 4/28 BCx >> ngtd  Thank you for allowing pharmacy to be a part of this patient's care.  Dimple Nanas, PharmD PGY-1 Acute Care Pharmacy Resident Office: [phone] 07/27/2020 9:18 AM   **Pharmacist phone directory can now be found on [email or web address] ([email or web address]).  Listed under [place].

## 2020-07-27 NOTE — Progress Notes (Signed)
     [address]       [place] [address]             [phone]       Patient is now extubated.  I had a long discussion with him along with his mother.  Given the root abscess, he now will need a replacement with coronary reimplantation.  This carries a high risk for needing a permanent pacemaker.  He states that he would like to proceed and also stated that he will no longer use IV drugs.  He definitely will need to be placed a stroke rehab center following surgery.  He is tentatively scheduled for 07/31/2020.  Dental clearance is pending.  Tempie Gibeault Bary Leriche

## 2020-07-27 NOTE — Progress Notes (Signed)
Post extubation doing well on bipap. Pulled his dialysis catheter out while on CRRT.  Will hold CRRT and discuss new line placement with nephrology; would defer until tomorrow unless he gets reintubated today.  Julian Hy, DO 07/27/20 9:24 AM [facility]

## 2020-07-27 NOTE — Progress Notes (Signed)
K+ 6.8 and bicarb 18. Dialysis access traumatically pulled out today. He is scheduled for tunneled catheter tomorrow. Will attempt to medically manage hyperkalemia with lokelma and shifting K+. EKG ordered. Bicarb drip ordered to manage bicarb and K+, but he is at risk for hyperkalemia. Elink and overnight team aware that he may need emergent access overnight if not responding to medical management.  Julian Hy, DO 07/27/20 7:35 PM [facility]

## 2020-07-27 NOTE — Progress Notes (Signed)
While eating, Pt repeatedly requesting drink after every bite. Writer reminded pt that he was over fluid restriction, to be mindful of that. Writer had to cue patient to slow down between bites and completely swallow before taking another bite. Pt at end of meal began pocketing food in cheeks and food had to be manually extracted by Probation officer. RN aware. Will continue to monitor.

## 2020-07-27 NOTE — Progress Notes (Signed)
ID Brief Note  TEE 07/26/20 findings noted  1. 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  2. 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 . The aortic valve is abnormal. Aortic valve  regurgitation is severe.  3. Left ventricular ejection fraction, by estimation, is 60 to 65%. The  left ventricle has normal function. The left ventricle has no regional  wall motion abnormalities.  4. Right ventricular systolic function is normal. The right ventricular  size is normal.  5. No left atrial/left atrial appendage thrombus was detected.  6. The mitral valve is normal in structure. No evidence of mitral valve  regurgitation.   CT sx aware and plan for valve replacement with coronary reimplantation noted. he might need a permanent pacemaker post op.  He is tentatively scheduled for 07/31/2020.  Dental clearance is pending.  Continue cefepime for now Please send valve tissue for cultures and path when he goes to OR Given the severity of his AV endocarditis complicated with Aortic abscess and possible need to be on PMK post OP, I  will reset the 6 weeks course of IV cefepime from the day of his OR planned on 5/9 Monitor CBC and BMP on IV antibiotics Will follow intermittently   Rosiland Oz, MD Infectious Disease Physician [facility] [address] [place], [place] [address] Phone: [phone]  Fax: [phone]

## 2020-07-28 ENCOUNTER — Inpatient Hospital Stay (HOSPITAL_COMMUNITY): Payer: 59

## 2020-07-28 DIAGNOSIS — E875 Hyperkalemia: Secondary | ICD-10-CM | POA: Diagnosis not present

## 2020-07-28 DIAGNOSIS — N179 Acute kidney failure, unspecified: Secondary | ICD-10-CM | POA: Diagnosis not present

## 2020-07-28 DIAGNOSIS — R34 Anuria and oliguria: Secondary | ICD-10-CM | POA: Diagnosis not present

## 2020-07-28 DIAGNOSIS — J9601 Acute respiratory failure with hypoxia: Secondary | ICD-10-CM | POA: Diagnosis not present

## 2020-07-28 LAB — GLUCOSE, CAPILLARY
Glucose-Capillary: 109 mg/dL — ABNORMAL HIGH (ref 70–99)
Glucose-Capillary: 120 mg/dL — ABNORMAL HIGH (ref 70–99)
Glucose-Capillary: 125 mg/dL — ABNORMAL HIGH (ref 70–99)
Glucose-Capillary: 148 mg/dL — ABNORMAL HIGH (ref 70–99)
Glucose-Capillary: 92 mg/dL (ref 70–99)
Glucose-Capillary: 93 mg/dL (ref 70–99)
Glucose-Capillary: 97 mg/dL (ref 70–99)
Glucose-Capillary: 98 mg/dL (ref 70–99)

## 2020-07-28 LAB — POCT I-STAT 7, (LYTES, BLD GAS, ICA,H+H)
Acid-Base Excess: 1 mmol/L (ref 0.0–2.0)
Bicarbonate: 24.3 mmol/L (ref 20.0–28.0)
Calcium, Ion: 1.3 mmol/L (ref 1.15–1.40)
HCT: 21 % — ABNORMAL LOW (ref 39.0–52.0)
Hemoglobin: 7.1 g/dL — ABNORMAL LOW (ref 13.0–17.0)
O2 Saturation: 99 %
Patient temperature: 97.9
Potassium: 6.5 mmol/L (ref 3.5–5.1)
Sodium: 130 mmol/L — ABNORMAL LOW (ref 135–145)
TCO2: 25 mmol/L (ref 22–32)
pCO2 arterial: 32.3 mmHg (ref 32.0–48.0)
pH, Arterial: 7.484 — ABNORMAL HIGH (ref 7.350–7.450)
pO2, Arterial: 148 mmHg — ABNORMAL HIGH (ref 83.0–108.0)

## 2020-07-28 LAB — CBC
HCT: 26.5 % — ABNORMAL LOW (ref 39.0–52.0)
Hemoglobin: 8.7 g/dL — ABNORMAL LOW (ref 13.0–17.0)
MCH: 26.8 pg (ref 26.0–34.0)
MCHC: 32.8 g/dL (ref 30.0–36.0)
MCV: 81.5 fL (ref 80.0–100.0)
Platelets: 224 10*3/uL (ref 150–400)
RBC: 3.25 MIL/uL — ABNORMAL LOW (ref 4.22–5.81)
RDW: 18 % — ABNORMAL HIGH (ref 11.5–15.5)
WBC: 23.4 10*3/uL — ABNORMAL HIGH (ref 4.0–10.5)
nRBC: 0.1 % (ref 0.0–0.2)

## 2020-07-28 LAB — POCT I-STAT, CHEM 8
BUN: >130 mg/dL — ABNORMAL HIGH (ref 6–20)
Calcium, Ion: 1.1 mmol/L — ABNORMAL LOW (ref 1.15–1.40)
Chloride: 99 mmol/L (ref 98–111)
Creatinine, Ser: 6.3 mg/dL — ABNORMAL HIGH (ref 0.61–1.24)
Glucose, Bld: 111 mg/dL — ABNORMAL HIGH (ref 70–99)
HCT: 24 % — ABNORMAL LOW (ref 39.0–52.0)
Hemoglobin: 8.2 g/dL — ABNORMAL LOW (ref 13.0–17.0)
Potassium: 7.4 mmol/L (ref 3.5–5.1)
Sodium: 130 mmol/L — ABNORMAL LOW (ref 135–145)
TCO2: 20 mmol/L — ABNORMAL LOW (ref 22–32)

## 2020-07-28 LAB — RENAL FUNCTION PANEL
Albumin: 2.1 g/dL — ABNORMAL LOW (ref 3.5–5.0)
Anion gap: 16 — ABNORMAL HIGH (ref 5–15)
BUN: 110 mg/dL — ABNORMAL HIGH (ref 6–20)
CO2: 19 mmol/L — ABNORMAL LOW (ref 22–32)
Calcium: 8.9 mg/dL (ref 8.9–10.3)
Chloride: 93 mmol/L — ABNORMAL LOW (ref 98–111)
Creatinine, Ser: 5.68 mg/dL — ABNORMAL HIGH (ref 0.61–1.24)
GFR, Estimated: 12 mL/min — ABNORMAL LOW (ref >60)
Glucose, Bld: 101 mg/dL — ABNORMAL HIGH (ref 70–99)
Phosphorus: 10.3 mg/dL — ABNORMAL HIGH (ref 2.5–4.6)
Potassium: >7.5 mmol/L (ref 3.5–5.1)
Sodium: 128 mmol/L — ABNORMAL LOW (ref 135–145)

## 2020-07-28 LAB — MAGNESIUM: Magnesium: 3.4 mg/dL — ABNORMAL HIGH (ref 1.7–2.4)

## 2020-07-28 LAB — POTASSIUM
Potassium: 5.7 mmol/L — ABNORMAL HIGH (ref 3.5–5.1)
Potassium: 5.4 mmol/L — ABNORMAL HIGH (ref 3.5–5.1)
Potassium: 4.7 mmol/L (ref 3.5–5.1)

## 2020-07-28 LAB — TRIGLYCERIDES: Triglycerides: 500 mg/dL — ABNORMAL HIGH (ref <150)

## 2020-07-28 MED ORDER — CALCIUM CHLORIDE 10 % IV SOLN
INTRAVENOUS | Status: AC
  Administered 2020-07-28: 1000 mg
  Filled 2020-07-28: qty 10

## 2020-07-28 MED ORDER — DEXMEDETOMIDINE HCL IN NACL 400 MCG/100ML IV SOLN
0.1000 ug/kg/h | INTRAVENOUS | Status: DC
  Filled 2020-07-28: qty 100

## 2020-07-28 MED ORDER — PRISMASOL BGK 0/2.5 32-2.5 MEQ/L EC SOLN
Status: DC
  Filled 2020-07-28 (×10): qty 5000

## 2020-07-28 MED ORDER — PRISMASOL BGK 0/2.5 32-2.5 MEQ/L REPLACEMENT SOLN
Status: DC
  Filled 2020-07-28 (×3): qty 5000

## 2020-07-28 MED ORDER — ALBUTEROL SULFATE (2.5 MG/3ML) 0.083% IN NEBU
10.0000 mg | INHALATION_SOLUTION | Freq: Once | RESPIRATORY_TRACT | Status: AC
  Administered 2020-07-28: 10 mg via RESPIRATORY_TRACT
  Filled 2020-07-28: qty 12

## 2020-07-28 MED ORDER — INSULIN REGULAR(HUMAN) IN NACL 100-0.9 UT/100ML-% IV SOLN
INTRAVENOUS | Status: DC
  Filled 2020-07-28: qty 100

## 2020-07-28 MED ORDER — VANCOMYCIN HCL 1500 MG/300ML IV SOLN
1500.0000 mg | INTRAVENOUS | Status: DC
  Filled 2020-07-28: qty 300

## 2020-07-28 MED ORDER — DEXTROSE 50 % IV SOLN
INTRAVENOUS | Status: AC
  Filled 2020-07-28: qty 50

## 2020-07-28 MED ORDER — EPINEPHRINE HCL 5 MG/250ML IV SOLN IN NS
0.0000 ug/min | INTRAVENOUS | Status: DC
Start: 2020-07-31 — End: 2020-07-31
  Filled 2020-07-28: qty 250

## 2020-07-28 MED ORDER — SODIUM ZIRCONIUM CYCLOSILICATE 10 G PO PACK
10.0000 g | PACK | Freq: Once | ORAL | Status: AC
  Administered 2020-07-28: 10 g via ORAL

## 2020-07-28 MED ORDER — PRISMASOL BGK 0/2.5 32-2.5 MEQ/L EC SOLN
Status: DC
  Filled 2020-07-28 (×6): qty 5000

## 2020-07-28 MED ORDER — SODIUM POLYSTYRENE SULFONATE 15 GM/60ML PO SUSP
30.0000 g | Freq: Once | ORAL | Status: AC
  Administered 2020-07-28: 30 g via ORAL
  Filled 2020-07-28: qty 120

## 2020-07-28 MED ORDER — PLASMA-LYTE 148 IV SOLN
INTRAVENOUS | Status: DC
  Filled 2020-07-28: qty 2.5

## 2020-07-28 MED ORDER — NOREPINEPHRINE 4 MG/250ML-% IV SOLN
0.0000 ug/min | INTRAVENOUS | Status: DC
  Filled 2020-07-28: qty 250

## 2020-07-28 MED ORDER — SODIUM BICARBONATE 8.4 % IV SOLN
100.0000 meq | Freq: Once | INTRAVENOUS | Status: AC
  Administered 2020-07-28: 100 meq via INTRAVENOUS

## 2020-07-28 MED ORDER — SODIUM CHLORIDE 0.9 % IV SOLN
2.0000 g | Freq: Two times a day (BID) | INTRAVENOUS | Status: DC
  Administered 2020-07-28 – 2020-08-02 (×11): 2 g via INTRAVENOUS
  Filled 2020-07-28 (×11): qty 2

## 2020-07-28 MED ORDER — POTASSIUM CHLORIDE 2 MEQ/ML IV SOLN
80.0000 meq | INTRAVENOUS | Status: DC
  Filled 2020-07-28: qty 40

## 2020-07-28 MED ORDER — NITROGLYCERIN IN D5W 200-5 MCG/ML-% IV SOLN
2.0000 ug/min | INTRAVENOUS | Status: DC
  Filled 2020-07-28: qty 250

## 2020-07-28 MED ORDER — CEFAZOLIN SODIUM-DEXTROSE 2-4 GM/100ML-% IV SOLN
2.0000 g | INTRAVENOUS | Status: DC
  Filled 2020-07-28: qty 100

## 2020-07-28 MED ORDER — PRISMASOL BGK 0/2.5 32-2.5 MEQ/L EC SOLN
Status: DC
  Filled 2020-07-28 (×8): qty 5000

## 2020-07-28 MED ORDER — TRANEXAMIC ACID (OHS) PUMP PRIME SOLUTION
2.0000 mg/kg | INTRAVENOUS | Status: DC
  Filled 2020-07-28 (×2): qty 2.33

## 2020-07-28 MED ORDER — TRANEXAMIC ACID (OHS) BOLUS VIA INFUSION
15.0000 mg/kg | INTRAVENOUS | Status: DC
  Filled 2020-07-28: qty 1748

## 2020-07-28 MED ORDER — SODIUM CHLORIDE 0.9 % IV SOLN
INTRAVENOUS | Status: DC
  Filled 2020-07-28: qty 30

## 2020-07-28 MED ORDER — TRANEXAMIC ACID 1000 MG/10ML IV SOLN
1.5000 mg/kg/h | INTRAVENOUS | Status: DC
  Filled 2020-07-28: qty 25

## 2020-07-28 MED ORDER — CALCIUM GLUCONATE-NACL 1-0.675 GM/50ML-% IV SOLN
1.0000 g | Freq: Once | INTRAVENOUS | Status: DC
  Filled 2020-07-28: qty 50

## 2020-07-28 MED ORDER — FENOFIBRATE 160 MG PO TABS
160.0000 mg | ORAL_TABLET | Freq: Every day | ORAL | Status: DC
  Administered 2020-07-29 – 2020-08-06 (×8): 160 mg via ORAL
  Filled 2020-07-28 (×9): qty 1

## 2020-07-28 MED ORDER — HEPARIN SODIUM (PORCINE) 1000 UNIT/ML DIALYSIS
1000.0000 [IU] | INTRAMUSCULAR | Status: DC | PRN
  Administered 2020-07-31: 1000 [IU] via INTRAVENOUS_CENTRAL
  Administered 2020-08-01 – 2020-08-02 (×2): 4000 [IU] via INTRAVENOUS_CENTRAL
  Administered 2020-08-03: 3200 [IU] via INTRAVENOUS_CENTRAL
  Administered 2020-08-03 – 2020-08-05 (×2): 4000 [IU] via INTRAVENOUS_CENTRAL
  Filled 2020-07-28: qty 3
  Filled 2020-07-28 (×2): qty 6
  Filled 2020-07-28 (×2): qty 4
  Filled 2020-07-28 (×3): qty 6
  Filled 2020-07-28: qty 3
  Filled 2020-07-28 (×2): qty 6
  Filled 2020-07-28: qty 3
  Filled 2020-07-28 (×2): qty 4
  Filled 2020-07-28: qty 6

## 2020-07-28 MED ORDER — PRISMASOL BGK 4/2.5 32-4-2.5 MEQ/L EC SOLN
Status: DC
  Filled 2020-07-28 (×11): qty 5000

## 2020-07-28 MED ORDER — MILRINONE LACTATE IN DEXTROSE 20-5 MG/100ML-% IV SOLN
0.3000 ug/kg/min | INTRAVENOUS | Status: DC
  Filled 2020-07-28: qty 100

## 2020-07-28 MED ORDER — PHENYLEPHRINE HCL-NACL 20-0.9 MG/250ML-% IV SOLN
30.0000 ug/min | INTRAVENOUS | Status: DC
  Filled 2020-07-28: qty 250

## 2020-07-28 MED ORDER — MANNITOL 20 % IV SOLN
Freq: Once | INTRAVENOUS | Status: DC
  Filled 2020-07-28: qty 13

## 2020-07-28 MED ORDER — PRISMASOL BGK 4/2.5 32-4-2.5 MEQ/L REPLACEMENT SOLN
Status: DC
  Filled 2020-07-28 (×13): qty 5000

## 2020-07-28 MED ORDER — FENTANYL CITRATE (PF) 100 MCG/2ML IJ SOLN
INTRAMUSCULAR | Status: AC
  Filled 2020-07-28: qty 2

## 2020-07-28 MED ORDER — DEXTROSE 50 % IV SOLN
1.0000 | Freq: Once | INTRAVENOUS | Status: AC
  Administered 2020-07-28: 50 mL via INTRAVENOUS

## 2020-07-28 MED ORDER — LIDOCAINE-EPINEPHRINE 1 %-1:100000 IJ SOLN
INTRAMUSCULAR | Status: AC
  Filled 2020-07-28: qty 1

## 2020-07-28 MED ORDER — MIDAZOLAM HCL 2 MG/2ML IJ SOLN
INTRAMUSCULAR | Status: AC
  Filled 2020-07-28: qty 2

## 2020-07-28 MED ORDER — SODIUM CHLORIDE 0.9 % FOR CRRT: INTRAVENOUS_CENTRAL | Status: DC | PRN

## 2020-07-28 MED ORDER — QUETIAPINE FUMARATE 25 MG PO TABS: 25.0000 mg | ORAL_TABLET | Freq: Every evening | ORAL | Status: DC | PRN

## 2020-07-28 MED ORDER — CALCIUM CHLORIDE 10 % IV SOLN
1.0000 g | Freq: Once | INTRAVENOUS | Status: AC
  Administered 2020-07-28: 1 g via INTRAVENOUS
  Filled 2020-07-28: qty 10

## 2020-07-28 MED ORDER — CALCIUM GLUCONATE-NACL 1-0.675 GM/50ML-% IV SOLN
1.0000 g | Freq: Once | INTRAVENOUS | Status: AC
  Administered 2020-07-28: 1000 mg via INTRAVENOUS
  Filled 2020-07-28: qty 50

## 2020-07-28 MED ORDER — HEPARIN SODIUM (PORCINE) 1000 UNIT/ML IJ SOLN
INTRAMUSCULAR | Status: AC
  Filled 2020-07-28: qty 1

## 2020-07-28 MED ORDER — INSULIN ASPART 100 UNIT/ML IV SOLN
5.0000 [IU] | Freq: Once | INTRAVENOUS | Status: AC
  Administered 2020-07-28: 5 [IU] via INTRAVENOUS

## 2020-07-28 MED ORDER — SODIUM BICARBONATE 8.4 % IV SOLN
INTRAVENOUS | Status: AC
  Filled 2020-07-28: qty 100

## 2020-07-28 NOTE — Procedures (Signed)
Central Venous Catheter Insertion Procedure Note  Alex [name]  [id number]  05-Nov-1980  Date:07/28/20  Time:10:24 AM   Provider Performing:Zaevion Parke Harle Battiest NP  Procedure: Insertion of Non-tunneled Central Venous Catheter(36556)with US guidance [id number])    Indication(s) Hemodialysis  Consent Risks of the procedure as well as the alternatives and risks of each were explained to the patient and/or caregiver.  Consent for the procedure was obtained and is signed in the bedside chart  Anesthesia Topical only with 1% lidocaine   Timeout Verified patient identification, verified procedure, site/side was marked, verified correct patient position, special equipment/implants available, medications/allergies/relevant history reviewed, required imaging and test results available.  Sterile Technique Maximal sterile technique including full sterile barrier drape, hand hygiene, sterile gown, sterile gloves, mask, hair covering, sterile ultrasound probe cover (if used).  Procedure Description Area of catheter insertion was cleaned with chlorhexidine and draped in sterile fashion.   With real-time ultrasound guidance a HD catheter was placed into the right internal jugular vein.  Nonpulsatile blood flow and easy flushing noted in all ports.  The catheter was sutured in place and sterile dressing applied.  Complications/Tolerance None; patient tolerated the procedure well. Chest X-ray is ordered to verify placement for internal jugular or subclavian cannulation.  Chest x-ray is not ordered for femoral cannulation.  EBL Minimal  Specimen(s) None  Procedure done under direct supervision of Dr. Noemi Chapel. Guidewire verification within the vein with [place] . Excellent venous blood return all ports. CXR is pending.  Magdalen Spatz, MSN, AGACNP-BC [place] for personal pager 07/28/2020 10:28 AM

## 2020-07-28 NOTE — Progress Notes (Signed)
Critical ABG values give to Dr. Carlis Abbott.

## 2020-07-28 NOTE — Progress Notes (Signed)
Pharmacy Antibiotic Note  Alex [name] is a 40 y.o. male admitted on 07/18/2020 and found to have Serratia bacteremia with TEE showing endocarditis. Pharmacy has been consulted for Cefepime dosing.  CRRT was re-initiated 5/6.  Will adjust Cefepime to 2g IV q12 hours for CRRT dosing.     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 ID planning for antibiotics stop date 6 weeks from valve repair (tentatively scheduled for 07/31/20)  WBC 5/5 was 18 (no CBC today), patient remains afebrile.   Plan: - Adjust Cefepime to 1g IV q24 hours - Follow-up plans for intermittent HD and Cefepime dose adjustments - Follow-up TCTS plans  - Monitor clinical improvement, culture results, ability to de-escalate antibiotics  Height: '6\' 4"'$  (193 cm) Weight: 116.5 kg (256 lb 13.4 oz) IBW/kg (Calculated) : 86.8  Temp (24hrs), Avg:99.1 F (37.3 C), Min:97.9 F (36.6 C), Max:100.3 F (37.9 C)  Recent Labs  Lab 07/23/20 0346 07/23/20 1540 07/24/20 0900 07/24/20 1600 07/25/20 0333 07/25/20 1542 07/26/20 0331 07/26/20 1712 07/27/20 0413 07/27/20 1636 07/28/20 0727 07/28/20 0815  WBC 17.3*  --  18.2*  --  22.0*  --  17.1*  --  18.1*  --   --   --   CREATININE 3.27*   < >  --    < > 2.54*   < > 2.68* 2.85* 2.16* 3.52* 5.68* 6.30*   < > = values in this interval not displayed.    Estimated Creatinine Clearance: 22.4 mL/min (A) (by C-G formula based on SCr of 6.3 mg/dL (H)).    No Known Allergies  Antimicrobials this admission: Vancomycin 4/26 x 1 Cefepime 4/26 >>  Dose adjustments this admission: 5/5: Cefepime adjusted to 1g IV q24 hours starting 5/6 (received 2g on 5/5) 5/6: Cefepime adjusted to 2g IV q12 hours - CRRT restarted  Microbiology results: 4/26 Flu/COVID >> neg 4/26 BCx >> Serratia 4/28 BCx >> ngtd  Thank you for allowing pharmacy to be a part of this patient's care.  Dimple Nanas, PharmD PGY-1 Acute Care Pharmacy  Resident Office: [phone] 07/28/2020 10:22 AM   **Pharmacist phone directory can now be found on [email or web address] ([email or web address]).  Listed under [place].

## 2020-07-28 NOTE — Progress Notes (Signed)
[facility] NEPHROLOGY PROGRESS NOTE  Assessment/ Plan: Pt is a 40 y.o. yo male with history of substance abuse, ruptured disc who was initially presented to ER with altered mental status found to be septic shock and Serratia bacteremia/AV endocarditis seen as a consultation for AKI requiring CRRT.  #Anuric acute kidney injury likely ischemic   ATN in the setting of septic shock/septic emboli.  Kidney ultrasound without hydronephrosis.   Started CRRT on 4/28-->5/5.  Restart 5/6 2/2 severe hyperkalemia: Low K bath, serial K and add K back as it corrects. 2m/ net neg  Temp HD cath removed 5/5 by patient, to be replaced by CCM today  iHD early next week maybe?  Consdier TDC next week with IR  # Severe hyperkalemia  As above  #Serratia more concerns bacteremia/AV endocarditis: Has root abscess.  With history of substance abuse.  Seen by ID, on IV cefepime.  TCTS is following for AVR tentatively 5/9  #Septic shock, multiorgan failure: Off pressors, per CCM, as above.  #Vent dependent respiratory failure managed by PCCM. Extubated 5/5  #Acute encephalopathy due to sepsis/septic emboli: Monitor mental status. Waxes/wanes  #Thrombocytopenia and anemia probably because of sepsis and acute illness.  Resolved  Subjective:   Developed severe hyperkalemia overnight into this AM despite med mgmt  [facility] > 7[facility]  CCM has temporized with Ca/HCO3/Insulin/resin  EKG w/o peaked Ts or interval elongation  Objective Vital signs in last 24 hours: Vitals:   07/28/20 1015 07/28/20 1050 07/28/20 1100 07/28/20 1200  BP:    (!) 116/50  Pulse: (!) 103  (!) 102 (!) 108  Resp: (!) 26  (!) 29 (!) 36  Temp:  98.4 F (36.9 C)    TempSrc:  Axillary    SpO2: 94%  98% 97%  Weight:      Height:       Weight change: -2.9 kg  Intake/Output Summary (Last 24 hours) at 07/28/2020 1220 Last data filed at 07/28/2020 1100 Gross per 24 hour  Intake  1886.49 ml  Output 0 ml  Net 1886.49 ml     Labs: Basic Metabolic Panel: Recent Labs  Lab 07/27/20 0413 07/27/20 1636 07/28/20 0727 07/28/20 0815 07/28/20 1040  NA 130* 128* 128* 130* 130*  K 4.8 6.8* >7.5* 7.4* 6.5*  CL 96* 95* 93* 99  --   CO2 25 18* 19*  --   --   GLUCOSE 123* 90 101* 111*  --   BUN 52* 81* 110* >130*  --   CREATININE 2.16* 3.52* 5.68* 6.30*  --   CALCIUM 8.7* 8.7* 8.9  --   --   PHOS 5.5* 7.1* 10.3*  --   --    Liver Function Tests: Recent Labs  Lab 07/27/20 0413 07/27/20 1636 07/28/20 0727  ALBUMIN 2.1* 2.1* 2.1*   No results for input(s): LIPASE, AMYLASE in the last 168 hours. No results for input(s): AMMONIA in the last 168 hours. CBC: Recent Labs  Lab 07/24/20 0900 07/25/20 0333 07/26/20 0331 07/27/20 0413 07/28/20 0727 07/28/20 0815 07/28/20 1040  WBC 18.2* 22.0* 17.1* 18.1* 23.4*  --   --   HGB 8.5* 8.6* 7.9* 8.2* 8.7* 8.2* 7.1*  HCT 27.6* 26.9* 25.2* 26.2* 26.5* 24.0* 21.0*  MCV 87.9 86.2 87.5 89.1 81.5  --   --   PLT 63* PLATELET CLUMPS NOTED ON SMEAR, UNABLE TO ESTIMATE 129* 191 224  --   --    Cardiac Enzymes: No  results for input(s): CKTOTAL, CKMB, CKMBINDEX, TROPONINI in the last 168 hours. CBG: Recent Labs  Lab 07/28/20 0358 07/28/20 0731 07/28/20 0951 07/28/20 1009 07/28/20 1047  GLUCAP 92 93 148* 125* 109*    Iron Studies:  No results for input(s): IRON, TIBC, TRANSFERRIN, FERRITIN in the last 72 hours. Studies/Results: DG CHEST PORT 1 VIEW  Result Date: 07/28/2020 CLINICAL DATA:  Central line placement EXAM: PORTABLE CHEST 1 VIEW COMPARISON:  07/22/2020 FINDINGS: Interval placement of right neck multi lumen vascular catheter, tip projecting near the superior cavoatrial junction. Interval removal of previously seen endotracheal tube, left neck vascular catheter, and right subclavian vascular catheter. No acute airspace opacity. IMPRESSION: Interval placement of right neck multi lumen vascular catheter, tip  projecting near the superior cavoatrial junction. Electronically Signed   By: Eddie Candle M.D.   On: 07/28/2020 11:07   ECHO TEE  Result Date: 07/27/2020    TRANSESOPHOGEAL ECHO REPORT   Patient Name:   Alex [name] Date of Exam: 07/26/2020 Medical Rec #:  [id number]         Height:       76.0 in Accession #:    [id number]        Weight:       265.7 lb Date of Birth:  1980-09-20         BSA:          2.499 m Patient Age:    39 years          BP:           116/49 mmHg Patient Gender: M                 HR:           94 bpm. Exam Location:  Inpatient Procedure: 3D Echo, Transesophageal Echo, Cardiac Doppler and Color Doppler Indications:     Aortic endocarditis.  History:         Patient has prior history of Echocardiogram examinations.                  Aortic Valve Disease; Signs/Symptoms:Hypotension. Endocarditis.                  Polysubstance abuse. Shock.  Sonographer:     Roseanna Rainbow RDCS Referring Phys:  [id number] [facility] A Gasper Sells Diagnosing Phys: Rudean Haskell MD PROCEDURE: After discussion of the risks and benefits of a TEE, an informed consent was obtained from a family member. The patient was intubated. The transesophogeal probe was passed without difficulty through the esophogus of the patient. Imaged were obtained with the patient in a supine position. Sedation performed by performing physician. The patient developed no complications during the procedure. IMPRESSIONS  1. 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  2. Type 1d Aortic Regurgitation: Perforated left coronary cusp with severe, eccentric aortic regurgitation. 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 . The aortic valve is abnormal. Aortic valve regurgitation is severe.  3. Left ventricular ejection fraction, by estimation, is 60 to 65%. The left ventricle has normal function. The left ventricle has no regional wall motion abnormalities.   4. Right ventricular systolic function is normal. The right ventricular size is normal.  5. No left atrial/left atrial appendage thrombus was detected.  6. The mitral valve is normal in structure. No evidence of mitral valve regurgitation. Comparison(s): A prior study was performed  on 07/20/2020. New aortic abscess. Notified CT surgery, ICU team, and family post procedure. FINDINGS  Left Ventricle: Left ventricular ejection fraction, by estimation, is 60 to 65%. The left ventricle has normal function. The left ventricle has no regional wall motion abnormalities. The left ventricular internal cavity size was normal in size. There is  no left ventricular hypertrophy. Right Ventricle: The right ventricular size is normal. No increase in right ventricular wall thickness. Right ventricular systolic function is normal. Left Atrium: Left atrial size was normal in size. No left atrial/left atrial appendage thrombus was detected. Right Atrium: Right atrial size was normal in size. Pericardium: Trivial pericardial effusion is present. Mitral Valve: The mitral valve is normal in structure. No evidence of mitral valve regurgitation. Tricuspid Valve: The tricuspid valve is normal in structure. Tricuspid valve regurgitation is not demonstrated. Aortic Valve: Type 1d Aortic Regurgitation: Perforated left coronary cusp with severe, eccentric aortic regurgitation. Large 36 x 14 mm mobile vegetation on ventricular surface of left coronary cusp. The aortic valve is abnormal. Aortic valve regurgitation is severe. Pulmonic Valve: The pulmonic valve was grossly normal. Pulmonic valve regurgitation is trivial. 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 The ascending aorta was not well visualized. IAS/Shunts: The atrial septum is grossly normal. Rudean Haskell MD Electronically signed by Rudean Haskell MD Signature Date/Time: 07/27/2020/7:48:35 AM    Final      Medications: Infusions: . sodium chloride Stopped (07/27/20 0907)  . [START ON 07/31/2020]  ceFAZolin (ANCEF) IV    . [START ON 07/31/2020]  ceFAZolin (ANCEF) IV    . ceFEPime (MAXIPIME) IV 2 g (07/28/20 1134)  . [START ON 07/31/2020] dexmedetomidine    . [START ON 07/31/2020] heparin 30,000 units/NS 1000 mL solution for CELLSAVER    . [START ON 07/31/2020] milrinone    . [START ON 07/31/2020] nitroGLYCERIN    . [START ON 07/31/2020] norepinephrine    . prismasol BGK 0/2.5 500 mL/hr at 07/28/20 1127  . prismasol BGK 2/2.5 dialysis solution 1,500 mL/hr at 07/28/20 1125  . prismasol BGK 2/2.5 replacement solution 1,000 mL/hr at 07/28/20 1123  . [START ON 07/31/2020] tranexamic acid (CYKLOKAPRON) infusion ([facility])    . [START ON 07/31/2020] vancomycin      Scheduled Medications: . chlorhexidine gluconate (MEDLINE KIT)  15 mL Mouth Rinse BID  . Chlorhexidine Gluconate Cloth  6 each Topical Daily  . clonazePAM  1 mg Per Tube BID  . docusate  100 mg Per Tube BID  . [START ON 07/31/2020] epinephrine  0-10 mcg/min Intravenous To OR  . feeding supplement (NEPRO CARB STEADY)  237 mL Oral BID BM  . [START ON 07/29/2020] fenofibrate  160 mg Oral Daily  . heparin injection (subcutaneous)  5,000 Units Subcutaneous Q8H  . [START ON 07/31/2020] heparin-papaverine-plasmalyte irrigation   Irrigation To OR  . [START ON 07/31/2020] insulin   Intravenous To OR  . [START ON 07/31/2020] [place] Blood Cardioplegia vial (lidocaine/magnesium/mannitol 0.26g-4g-6.4g)   Intracoronary Once  . mouth rinse  15 mL Mouth Rinse 10 times per day  . melatonin  3 mg Per Tube QHS  . multivitamin  1 tablet Oral QHS  . pantoprazole sodium  40 mg Per Tube Daily  . [START ON 07/31/2020] phenylephrine  30-200 mcg/min Intravenous To OR  . polyethylene glycol  17 g Per Tube Daily  . [START ON 07/31/2020] potassium chloride  80 mEq Other To OR  . QUEtiapine  100 mg Per Tube QHS  .  QUEtiapine  25 mg Per Tube q AM  . sodium chloride flush  10-40 mL  Intracatheter Q12H  . [START ON 07/31/2020] tranexamic acid  15 mg/kg Intravenous To OR  . [START ON 07/31/2020] tranexamic acid  2 mg/kg Intracatheter To OR    have reviewed scheduled and prn medications.  Physical Exam: General: ill appearing, NAD Heart:RRR, s1s2 nl Lungs: Coarse breath sound bilateral Abdomen:soft, Non-tender, non-distended Extremities:  No edema Dialysis Access:none currently  Rexene Agent 07/28/2020,12:20 PM  LOS: 10 days

## 2020-07-28 NOTE — Progress Notes (Addendum)
NAME:  Alex [name], MRN:  [id number], DOB:  March 25, 1981, LOS: 89 ADMISSION DATE:  07/18/2020, CONSULTATION DATE:  4/26 REFERRING MD:  Dr. Florina Ou, CHIEF COMPLAINT:  AMS   History of Present Illness:  40 yo male smoker brought to [facility] ER with altered mental status.  Noted to have headache and feeling feverish before admission.  Found to have hypotension, AKI, thrombocytopenia, splenomegaly, lactic acidosis, elevated bilirubin, hypoglycemia.  Started on antibiotics and pressors for possible sepsis, but no clear source of infection.  Normal renal function from February 2022.  Uses suboxone for history of substance abuse.  UDS positive for amphetamines.  Transfer to [facility] for further therapy.  Hx from chart, medical team, and pt's mother.  Pertinent  Medical History  Polysubstance Abuse - amphetamines, heroin. On Suboxone.   Ruptured Disc  Significant Hospital Events: Including procedures, antibiotic start and stop dates in addition to other pertinent events   . 4/26 [facility] from [facility] . CT head 4/26 >> no acute findings . CT abd/pelvis 4/26 >> cholelithiasis w/o inflammatory chagnes, mild hepatomegaly (25 cm), moderate splenomegaly (16.7 cm), 2 mm non obstructing kidney stone on Lt . 4/27 CVL placed. Pressors escalating.  . 4/28 intubated for TEE, CVC, HD, and A-line placed. CRRT started  . Remains on CRRT . MRI on 4/30-multiple infarcts . 5/4: repeat TEE shows an aortic valve abscess . 5/5: extubated  . 5/6: central line placed   Interim History / Subjective:   Elevated K overnight. Patient continues to be awake and alert on exam. Denies any pain.  Objective   Blood pressure 114/70, pulse (!) 105, temperature 97.9 F (36.6 C), temperature source Oral, resp. rate 19, height '6\' 4"'$  (1.93 m), weight 116.5 kg, SpO2 100 %.    Vent Mode: BIPAP;PCV FiO2 (%):  [30 %-32 %] 32 % Set Rate:  [15 bmp] 15 bmp PEEP:  [5 cmH20] 5 cmH20   Intake/Output Summary (Last 24 hours) at 07/28/2020  0842 Last data filed at 07/28/2020 0100 Gross per 24 hour  Intake 1640.63 ml  Output 231 ml  Net 1409.63 ml   Filed Weights   07/26/20 0500 07/27/20 0412 07/28/20 0500  Weight: 120.5 kg 119.4 kg 116.5 kg    Examination:  General: No acute distress, awake and following commands, spontaneously moving all extremities HEENT: Moist oral mucosa, endotracheal tube in place Neuro: Following simple commands, moving all 4 extremities CV: Regular rate and rhythm, no murmurs rubs or gallops PULM: Clear to auscultation bilaterally, on room air GI: Soft, bowel sounds present, nontender to palpation Extremities: Skin is warm and dry, significant generalized edema Skin: no rashes or lesions  Labs/imaging that I havepersonally reviewed    4/28 ECHO Left ventricular EF  60 to 65%. nromal LV and RV function. Perforated left coronary cusp with severe aortic regurgitation. Large  36 x 41m mobile vegetation on ventricular surface of left coronary cusp.  The aortic valve is abnormal. Aortic valve regurgitation is severe.   4/30 MRI: IMPRESSION: Dozens of primarily punctate acute infarctions scattered throughout the cerebellum and both cerebral hemispheres consistent with micro embolic infarctions from the heart or ascending aorta. Several are associated with petechial blood products as outlined above. Septic emboli not excluded given the clinical history.  5/3: RLE doppler negative for DVT  5/4: aortic root sinus of valvasa abscess  5/5: extubated   5/6: planned for tunneled HD cath but due to hyperkalemia not done. Central venous catheter placed at bedside   Resolved  Hospital Problem list   Hyponatremia, septic shock, metabolic encephalopathy, acute hypoxic resp failure   Assessment & Plan:   24 yoM who presented with AMS found to be in septic shock secondary to serratia bacteremia, requiring pressor support. MRI brain demonstrated multiple infarcts likely in the setting of septic emboli. He  required intubation for TEE which confirmed endocarditis with severe AI. He has been difficult to wean from propofol and ventilatory support.  Serratia bacteremia and aortic valve endocarditis and perivalvular abscess Multiple infarcts on MRI brain -Continue cefepime -Will need dental clearance prior to valve repair -Appreciate ID and CTS input  Acute hypoxic respiratory failure, resolved Extubated on 5/5, tolerated well. Initially transitioned to Bipap. Was on room air but mentation is waxing and waning. Monitor closely, may need reintubation if this worsens.   Hyperkalemia K 6.8, repeat labs pending. May need emergent dialysis. Central venous catheter placed at bedside, will resume CRRT.   Acute kidney injury Anuria Likely ischemic ATN in the setting of septic shock and septic emboli.  Requiring CRRT since 4/28, however traumatically pulled out HD line on 5/5. Getting a tunneled HD catheter today, may need dialysis after.  -Bladder scan per shift  Anemia Thrombocytopenia Stable. -Continue to intermittently trend CBC  Polysubstance abuse -Suboxone, amphetamines -Cessation education when appropriate   Best practice   Diet:  Tube Feed  Pain/Anxiety/Delirium protocol (if indicated): No VAP protocol (if indicated): Yes DVT prophylaxis: SCD GI prophylaxis: PPI Glucose control:  SSI No Central venous access:  N/A Arterial line:  N/A Foley:  N/A Mobility:  bed rest  PT consulted: N/A Last date of multidisciplinary goals of care discussion: mother updated at bedside 5/5 Code Status:  full code Disposition: ICU   Jebadiah Imperato, DO PGY-2 IMTS

## 2020-07-29 DIAGNOSIS — D72829 Elevated white blood cell count, unspecified: Secondary | ICD-10-CM

## 2020-07-29 DIAGNOSIS — R34 Anuria and oliguria: Secondary | ICD-10-CM | POA: Diagnosis not present

## 2020-07-29 DIAGNOSIS — E875 Hyperkalemia: Secondary | ICD-10-CM | POA: Diagnosis not present

## 2020-07-29 DIAGNOSIS — J9601 Acute respiratory failure with hypoxia: Secondary | ICD-10-CM | POA: Diagnosis not present

## 2020-07-29 DIAGNOSIS — N179 Acute kidney failure, unspecified: Secondary | ICD-10-CM | POA: Diagnosis not present

## 2020-07-29 LAB — RENAL FUNCTION PANEL
Albumin: 1.9 g/dL — ABNORMAL LOW (ref 3.5–5.0)
Albumin: 2 g/dL — ABNORMAL LOW (ref 3.5–5.0)
Anion gap: 12 (ref 5–15)
Anion gap: 13 (ref 5–15)
BUN: 79 mg/dL — ABNORMAL HIGH (ref 6–20)
BUN: 62 mg/dL — ABNORMAL HIGH (ref 6–20)
CO2: 22 mmol/L (ref 22–32)
CO2: 19 mmol/L — ABNORMAL LOW (ref 22–32)
Calcium: 8.3 mg/dL — ABNORMAL LOW (ref 8.9–10.3)
Calcium: 8.1 mg/dL — ABNORMAL LOW (ref 8.9–10.3)
Chloride: 97 mmol/L — ABNORMAL LOW (ref 98–111)
Chloride: 97 mmol/L — ABNORMAL LOW (ref 98–111)
Creatinine, Ser: 4.03 mg/dL — ABNORMAL HIGH (ref 0.61–1.24)
Creatinine, Ser: 3.11 mg/dL — ABNORMAL HIGH (ref 0.61–1.24)
GFR, Estimated: 19 mL/min — ABNORMAL LOW (ref >60)
GFR, Estimated: 25 mL/min — ABNORMAL LOW (ref >60)
Glucose, Bld: 87 mg/dL (ref 70–99)
Glucose, Bld: 110 mg/dL — ABNORMAL HIGH (ref 70–99)
Phosphorus: 8.7 mg/dL — ABNORMAL HIGH (ref 2.5–4.6)
Phosphorus: 6.1 mg/dL — ABNORMAL HIGH (ref 2.5–4.6)
Potassium: 5.1 mmol/L (ref 3.5–5.1)
Potassium: 5.6 mmol/L — ABNORMAL HIGH (ref 3.5–5.1)
Sodium: 131 mmol/L — ABNORMAL LOW (ref 135–145)
Sodium: 129 mmol/L — ABNORMAL LOW (ref 135–145)

## 2020-07-29 LAB — CBC
HCT: 23.4 % — ABNORMAL LOW (ref 39.0–52.0)
Hemoglobin: 7.4 g/dL — ABNORMAL LOW (ref 13.0–17.0)
MCH: 27.5 pg (ref 26.0–34.0)
MCHC: 31.6 g/dL (ref 30.0–36.0)
MCV: 87 fL (ref 80.0–100.0)
Platelets: 291 10*3/uL (ref 150–400)
RBC: 2.69 MIL/uL — ABNORMAL LOW (ref 4.22–5.81)
RDW: 18.8 % — ABNORMAL HIGH (ref 11.5–15.5)
WBC: 30 10*3/uL — ABNORMAL HIGH (ref 4.0–10.5)
nRBC: 0 % (ref 0.0–0.2)

## 2020-07-29 LAB — GLUCOSE, CAPILLARY
Glucose-Capillary: 95 mg/dL (ref 70–99)
Glucose-Capillary: 98 mg/dL (ref 70–99)
Glucose-Capillary: 89 mg/dL (ref 70–99)
Glucose-Capillary: 118 mg/dL — ABNORMAL HIGH (ref 70–99)
Glucose-Capillary: 95 mg/dL (ref 70–99)
Glucose-Capillary: 97 mg/dL (ref 70–99)

## 2020-07-29 LAB — MAGNESIUM: Magnesium: 3.1 mg/dL — ABNORMAL HIGH (ref 1.7–2.4)

## 2020-07-29 MED ORDER — MELATONIN 3 MG PO TABS
3.0000 mg | ORAL_TABLET | Freq: Every day | ORAL | Status: DC
  Administered 2020-07-29 – 2020-08-06 (×9): 3 mg via ORAL
  Filled 2020-07-29 (×10): qty 1

## 2020-07-29 MED ORDER — DOCUSATE SODIUM 100 MG PO CAPS
100.0000 mg | ORAL_CAPSULE | Freq: Two times a day (BID) | ORAL | Status: DC
  Administered 2020-07-29 – 2020-07-31 (×3): 100 mg via ORAL
  Filled 2020-07-29 (×3): qty 1

## 2020-07-29 MED ORDER — POLYETHYLENE GLYCOL 3350 17 G PO PACK
17.0000 g | PACK | Freq: Every day | ORAL | Status: DC
  Filled 2020-07-29: qty 1

## 2020-07-29 MED ORDER — QUETIAPINE FUMARATE 25 MG PO TABS
25.0000 mg | ORAL_TABLET | Freq: Every evening | ORAL | Status: DC | PRN
  Administered 2020-08-01 – 2020-08-06 (×5): 25 mg via ORAL
  Filled 2020-07-29 (×6): qty 1

## 2020-07-29 MED ORDER — CLONAZEPAM 1 MG PO TABS
1.0000 mg | ORAL_TABLET | Freq: Two times a day (BID) | ORAL | Status: DC
  Administered 2020-07-29 – 2020-07-30 (×3): 1 mg via ORAL
  Filled 2020-07-29 (×3): qty 1

## 2020-07-29 MED ORDER — HEPARIN BOLUS VIA INFUSION (CRRT)
1000.0000 [IU] | INTRAVENOUS | Status: DC | PRN
  Filled 2020-07-29 (×2): qty 1000

## 2020-07-29 MED ORDER — ACETAMINOPHEN 160 MG/5ML PO SOLN
500.0000 mg | Freq: Four times a day (QID) | ORAL | Status: DC | PRN
  Administered 2020-07-31: 500 mg via ORAL
  Filled 2020-07-29: qty 20.3

## 2020-07-29 MED ORDER — SODIUM CHLORIDE 0.9 % IV SOLN
250.0000 [IU]/h | INTRAVENOUS | Status: DC
  Administered 2020-07-29 – 2020-08-01 (×4): 500 [IU]/h via INTRAVENOUS_CENTRAL
  Filled 2020-07-29 (×4): qty 2

## 2020-07-29 MED ORDER — PANTOPRAZOLE SODIUM 40 MG PO TBEC
40.0000 mg | DELAYED_RELEASE_TABLET | Freq: Every day | ORAL | Status: DC
  Administered 2020-07-29 – 2020-08-02 (×5): 40 mg via ORAL
  Filled 2020-07-29 (×5): qty 1

## 2020-07-29 MED ORDER — PRISMASOL BGK 0/2.5 32-2.5 MEQ/L EC SOLN
Status: DC
  Filled 2020-07-29 (×21): qty 5000

## 2020-07-29 NOTE — Evaluation (Signed)
Speech Language Pathology Evaluation Patient Details Name: DARDAN ZIETLOW MRN: [id number] DOB: Mar 26, 1980 Today's Date: 07/29/2020 Time: [id number] SLP Time Calculation (min) (ACUTE ONLY): 25 min  Problem List:  Patient Active Problem List   Diagnosis Date Noted  . Cerebral embolism with cerebral infarction 07/24/2020  . 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 Aortic valve endocarditis   . Pressure injury of skin 07/20/2020  . Elevated LFTs   . Sepsis ([place]) 07/18/2020  . Septic shock ([place]) 07/18/2020  . Altered mental status 07/18/2020  . Acute renal failure with oliguria ([place])   . Hypotension   . Elevated bilirubin   . Thrombocytopenia ([place])   . Plantar fasciitis, left 07/12/2020  . Essential hypertension 01/14/2020  . 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 Nicotine dependence, cigarettes, uncomplicated [id number]  . Opioid dependence on agonist therapy ([place]) 09/24/2017  . Opioid use disorder 09/24/2017   Past Medical History:  Past Medical History:  Diagnosis Date  . Ruptured lumbar disc    Past Surgical History: History reviewed. No pertinent surgical history. HPI:  40 yo male smoker brought to [facility] ER 07/18/20 with altered mental status. Found to have hypotension, AKI, thrombocytopenia, splenomegaly, lactic acidosis, elevated bilirubin, hypoglycemia, polysubstance abuse. Treated for sepsis Intubated 4/28-5/5. CRRT initiated 4/28, discontinued when pt pulled out catheter on 5/5; TEE found endocarditis with aortic valve perforation; MRI brain showed dozens of primarily punctate acute infarctions scattered throughout the cerebellum and both cerebral hemisphere.   Assessment / Plan / Recommendation Clinical Impression  Patient was seen for a cognitive-linguistic evaluation in the setting of multiple acute infarctions.  Pt was encountered awake/alert and was agreeable to this evaluation.  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  He additionally stated that he is fully independent with IADLs, including medication and financial management at baseline.  No family present to establish a cognitive baseline.  Pt was noted to have mildly reduced lingual strength, likely contributing to minimal dysarthria.  Pt's speech was >95% intelligible at the conversation level, but articulation was noted to be slightly imprecise and rate of speech was slowed.  Pt reported that this is a change from baseline.  Pt exhibited moderate deficits in attention, and mild deficits in short-term memory, numeric problem solving, and awareness.  Expressive language appeared to be functional and receptive language was mostly functional besides deficits in following multiple step commands.  Suspect that this was due to attention impairment rather than true receptive language deficit.  Recommend additional ST acutely and in the outpatient setting following discharge targeting cognitive-linguistic deficits.  Pt would additionally benefit from supervision with IADLs at time of discharge.  Discussed results and recommendations with pt and he verbalized understanding.  SLP will f/u per POC.    SLP Assessment  SLP Recommendation/Assessment: Patient needs continued Speech Lanaguage Pathology Services SLP Visit Diagnosis: Cognitive communication deficit (R41.841)    Follow Up Recommendations  Outpatient SLP    Frequency and Duration min 1 x/week  2 weeks      SLP Evaluation Cognition  Overall Cognitive Status: Impaired/Different from baseline Arousal/Alertness: Awake/alert Orientation Level: Oriented X4 Attention: Sustained;Focused Focused Attention: Impaired Focused Attention Impairment: Verbal complex Sustained Attention: Impaired Sustained Attention Impairment: Verbal complex Memory: Impaired Memory Impairment: Decreased short term memory Memory Recall Sock: Without Cue Memory Recall  Blue: Without Cue Memory Recall Bed: With Cue Awareness: Impaired Awareness Impairment:  Intellectual impairment Problem Solving: Impaired Problem Solving Impairment: Verbal complex Executive Function: Reasoning Reasoning: Impaired Reasoning Impairment: Verbal complex Safety/Judgment: Appears intact       Comprehension  Auditory Comprehension Overall Auditory Comprehension: Impaired Yes/No Questions: Within Functional Limits Commands: Impaired One Step Basic Commands: 75-100% accurate Two Step Basic Commands: 75-100% accurate Multistep Basic Commands: 50-74% accurate Conversation: Complex Interfering Components: Attention    Expression Expression Primary Mode of Expression: Verbal Verbal Expression Overall Verbal Expression: Appears within functional limits for tasks assessed Written Expression Dominant Hand: Right   Oral / Motor  Oral Motor/Sensory Function Overall Oral Motor/Sensory Function: Mild impairment Facial ROM: Within Functional Limits Facial Symmetry: Within Functional Limits Lingual ROM: Within Functional Limits Lingual Symmetry: Within Functional Limits Lingual Strength: Reduced Motor Speech Overall Motor Speech: Impaired Respiration: Within functional limits Phonation: Normal Resonance: Within functional limits Articulation: Impaired Level of Impairment: Phrase Intelligibility: Intelligible Motor Planning: Witnin functional limits Effective Techniques: Over-articulate   GO                   Colin Mulders., M.S., CCC-SLP Acute Rehabilitation Services Office: [phone]  Elvia Collum Courtny Bennison 07/29/2020, 10:11 AM

## 2020-07-29 NOTE — Procedures (Signed)
Admit: 07/18/2020 LOS: 77  63M AV IE and perivalvular abscess, serrata sp.  For AVR repair next week;  Anuric AKI from ATN  Current CRRT Prescription: Start Date: Restarted 5/6, first RRT4/28 Catheter: R IJ Temp HC Cath placed 07/28/20 by CCM BFR: 220 Pre Blood Pump: 1000 4K DFR: 1500 4K Replacement Rate: 300 2K Goal UF: 3m/h net neg Anticoagulation: none Clotting: has occurred since starting  S: Potassium normalized overnight, added K back to baths K is 5.1 this morning Having some clotting, is not on heparin in the circuit  O: 05/06 0701 - 05/07 0700 In: 1483.4 [P.O.:970; I.V.:263.6; IV Piggyback:249.8] Out: 1790   Filed Weights   07/27/20 0412 07/28/20 0500 07/29/20 0241  Weight: 119.4 kg 116.5 kg 118.5 kg    Recent Labs  Lab 07/27/20 1636 07/28/20 0727 07/28/20 0815 07/28/20 1040 07/28/20 1453 07/28/20 1849 07/28/20 2109 07/29/20 0308  NA 128* 128* 130* 130*  --   --   --  131*  K 6.8* >7.5* 7.4* 6.5*   < > 5.4* 4.7 5.1  CL 95* 93* 99  --   --   --   --  97*  CO2 18* 19*  --   --   --   --   --  22  GLUCOSE 90 101* 111*  --   --   --   --  87  BUN 81* 110* >130*  --   --   --   --  79*  CREATININE 3.52* 5.68* 6.30*  --   --   --   --  4.03*  CALCIUM 8.7* 8.9  --   --   --   --   --  8.3*  PHOS 7.1* 10.3*  --   --   --   --   --  8.7*   < > = values in this interval not displayed.   Recent Labs  Lab 07/27/20 0413 07/28/20 0727 07/28/20 0815 07/28/20 1040 07/29/20 0449  WBC 18.1* 23.4*  --   --  30.0*  HGB 8.2* 8.7* 8.2* 7.1* 7.4*  HCT 26.2* 26.5* 24.0* 21.0* 23.4*  MCV 89.1 81.5  --   --  87.0  PLT 191 224  --   --  291    Scheduled Meds: . chlorhexidine gluconate (MEDLINE KIT)  15 mL Mouth Rinse BID  . Chlorhexidine Gluconate Cloth  6 each Topical Daily  . clonazePAM  1 mg Per Tube BID  . docusate  100 mg Per Tube BID  . [START ON 07/31/2020] epinephrine  0-10 mcg/min Intravenous To OR  . feeding supplement (NEPRO CARB STEADY)  237 mL Oral BID  BM  . fenofibrate  160 mg Oral Daily  . heparin injection (subcutaneous)  5,000 Units Subcutaneous Q8H  . [START ON 07/31/2020] heparin-papaverine-plasmalyte irrigation   Irrigation To OR  . [START ON 07/31/2020] insulin   Intravenous To OR  . [START ON 07/31/2020] [place] Blood Cardioplegia vial (lidocaine/magnesium/mannitol 0.26g-4g-6.4g)   Intracoronary Once  . melatonin  3 mg Per Tube QHS  . multivitamin  1 tablet Oral QHS  . pantoprazole sodium  40 mg Per Tube Daily  . [START ON 07/31/2020] phenylephrine  30-200 mcg/min Intravenous To OR  . polyethylene glycol  17 g Per Tube Daily  . [START ON 07/31/2020] potassium chloride  80 mEq Other To OR  . sodium chloride flush  10-40 mL Intracatheter Q12H  . [START ON 07/31/2020] tranexamic acid  15 mg/kg Intravenous To OR  . [  START ON 07/31/2020] tranexamic acid  2 mg/kg Intracatheter To OR   Continuous Infusions: .  prismasol BGK 4/2.5 1,000 mL/hr at 07/29/20 0457  . sodium chloride 20 mL/hr at 07/29/20 0800  . [START ON 07/31/2020]  ceFAZolin (ANCEF) IV    . [START ON 07/31/2020]  ceFAZolin (ANCEF) IV    . ceFEPime (MAXIPIME) IV Stopped (07/28/20 2150)  . [START ON 07/31/2020] dexmedetomidine    . heparin 10,000 units/ 20 mL infusion syringe 500 Units/hr (07/29/20 0827)  . [START ON 07/31/2020] heparin 30,000 units/NS 1000 mL solution for CELLSAVER    . [START ON 07/31/2020] milrinone    . [START ON 07/31/2020] nitroGLYCERIN    . [START ON 07/31/2020] norepinephrine    . prismasol BGK 2/2.5 replacement solution 300 mL/hr at 07/28/20 2328  . prismasol BGK 4/2.5 1,500 mL/hr at 07/29/20 0617  . [START ON 07/31/2020] tranexamic acid (CYKLOKAPRON) infusion ([facility])    . [START ON 07/31/2020] vancomycin     PRN Meds:.acetaminophen (TYLENOL) oral liquid 160 mg/5 mL, acetaminophen, fentaNYL, haloperidol lactate, heparin, heparin, midazolam, QUEtiapine, sodium chloride, sodium chloride flush  ABG    Component Value Date/Time   PHART 7.484 (H) 07/28/2020 1040   PCO2ART  32.3 07/28/2020 1040   PO2ART 148 (H) 07/28/2020 1040   HCO3 24.3 07/28/2020 1040   TCO2 25 07/28/2020 1040   ACIDBASEDEF 7.3 (H) 07/20/2020 1530   O2SAT 99.0 07/28/2020 1040    A/P  1. 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 Hyperkalemia 07/28/2020, severe, improved 3. 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 VDRF, resolved, normal intermittently on BiPAP 5. Anemia, transfusion per CCM 6. Shock, resolved  Trend potassium Add fixed dose heparin 500 units an hour to the CRRT circuit  Pearson Grippe, MD [employer] pgr [phone]

## 2020-07-29 NOTE — Progress Notes (Signed)
NAME:  Alex [name], MRN:  [id number], DOB:  [birth year], LOS: 61 ADMISSION DATE:  07/18/2020, CONSULTATION DATE:  4/26 REFERRING MD:  Dr. Florina Ou, CHIEF COMPLAINT:  AMS   History of Present Illness:  40 yo male smoker brought to [facility] ER with altered mental status.  Noted to have headache and feeling feverish before admission.  Found to have hypotension, AKI, thrombocytopenia, splenomegaly, lactic acidosis, elevated bilirubin, hypoglycemia.  Started on antibiotics and pressors for possible sepsis, but no clear source of infection.  Normal renal function from February 2022.  Uses suboxone for history of substance abuse.  UDS positive for amphetamines.  Transfer to [facility] for further therapy.  Hx from chart, medical team, and pt's mother.  Pertinent  Medical History  Polysubstance Abuse - amphetamines, heroin. On Suboxone.   Ruptured Disc  Significant Hospital Events: Including procedures, antibiotic start and stop dates in addition to other pertinent events   . 4/26 transfer from [facility] to [facility] . CT head 4/26 >> no acute findings . CT abd/pelvis 4/26 >> cholelithiasis w/o inflammatory chagnes, mild hepatomegaly (25 cm), moderate splenomegaly (16.7 cm), 2 mm non obstructing kidney stone on Lt . 4/27 CVL placed. Pressors escalating.  . 4/28 intubated for TEE, CVC, HD, and A-line placed. CRRT started  . Remains on CRRT . MRI on 4/30-multiple infarcts . 5/4: repeat TEE shows an aortic valve abscess . 5/5: extubated  . 5/6: central line placed   Interim History / Subjective:   K+ decreasing overnight. He denies complaints. Able to be more calm out of restraints today. More awake today.  Objective   Blood pressure (!) 118/32, pulse 81, temperature 98.7 F (37.1 C), temperature source Oral, resp. rate 20, height '6\' 4"'$  (1.93 m), weight 118.5 kg, SpO2 100 %.    Vent Mode: BIPAP;PCV;Stand-by FiO2 (%):  [30 %] 30 % Set Rate:  [15 bmp] 15 bmp PEEP:  [5 cmH20] 5 cmH20   Intake/Output Summary  (Last 24 hours) at 07/29/2020 1105 Last data filed at 07/29/2020 1000 Gross per 24 hour  Intake 1515.22 ml  Output 1986 ml  Net -470.78 ml   Filed Weights   07/27/20 0412 07/28/20 0500 07/29/20 0241  Weight: 119.4 kg 116.5 kg 118.5 kg    Examination:  General: young man lying in bed in NAD HEENT: [place]/AT, eyes anicteric, breathing comfortably on [place] Neuro: awake, more alert, speech more clear today, moving all extremities spontaneously. Answering questions appropriately. 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  -wean off O2   Hyperkalemia> resolved on CRRT -second temp cath placed, will eventually need tunneled access -con't to monitor closely -appreciate nephrology's recommendations   Acute kidney injury Anuria Likely ischemic ATN in the setting of septic shock and septic emboli.  Requiring CRRT since 4/28, however traumatically pulled out HD line on 5/5. Getting a tunneled HD  catheter today, may need dialysis after.  -bladder scans Qshift to monitor for urine production -CRRT through the weekend, will reassess if he is stable enough to switch to intermittent HD this week  Acute anemia Thrombocytopenia resolved -transfuse for Hb <7 or hemodynamically significant bleeding -Continue to  intermittently trend CBC  Leukocytosis, worsening-- unclear etiolgoy -repeat blood cultures  Polysubstance abuse -Suboxone, amphetamines -Cessation education has been provided   Best practice   Diet:  Oral Pain/Anxiety/Delirium protocol (if indicated): No VAP protocol (if indicated): Not indicated DVT prophylaxis: Subcutaneous Heparin and SCD GI prophylaxis: PPI Glucose control:  SSI No Central venous access:  N/A Arterial line:  N/A Foley:  N/A Mobility:  bed rest  PT consulted: N/A Last date of multidisciplinary goals of care discussion: mother updated at bedside 5/7 Code Status:  full code Disposition: ICU for CRRT    This patient is critically ill with multiple organ system failure which requires frequent high complexity decision making, assessment, support, evaluation, and titration of therapies. This was completed through the application of advanced monitoring technologies and extensive interpretation of multiple databases. During this encounter critical care time was devoted to patient care services described in this note for 35 minutes.  Julian Hy, DO 07/29/20 11:37 AM [place] [facility]

## 2020-07-29 NOTE — Progress Notes (Addendum)
RT went to check on pt again. Pt not ready to wear BIPAP, no distress noted, VSS.

## 2020-07-30 ENCOUNTER — Inpatient Hospital Stay (HOSPITAL_COMMUNITY): Payer: 59

## 2020-07-30 DIAGNOSIS — E875 Hyperkalemia: Secondary | ICD-10-CM | POA: Diagnosis not present

## 2020-07-30 DIAGNOSIS — J9601 Acute respiratory failure with hypoxia: Secondary | ICD-10-CM | POA: Diagnosis not present

## 2020-07-30 DIAGNOSIS — R34 Anuria and oliguria: Secondary | ICD-10-CM | POA: Diagnosis not present

## 2020-07-30 DIAGNOSIS — N179 Acute kidney failure, unspecified: Secondary | ICD-10-CM | POA: Diagnosis not present

## 2020-07-30 LAB — RENAL FUNCTION PANEL
Albumin: 1.8 g/dL — ABNORMAL LOW (ref 3.5–5.0)
Albumin: 1.7 g/dL — ABNORMAL LOW (ref 3.5–5.0)
Anion gap: 8 (ref 5–15)
Anion gap: 6 (ref 5–15)
BUN: 53 mg/dL — ABNORMAL HIGH (ref 6–20)
BUN: 49 mg/dL — ABNORMAL HIGH (ref 6–20)
CO2: 22 mmol/L (ref 22–32)
CO2: 24 mmol/L (ref 22–32)
Calcium: 8.1 mg/dL — ABNORMAL LOW (ref 8.9–10.3)
Calcium: 7.8 mg/dL — ABNORMAL LOW (ref 8.9–10.3)
Chloride: 100 mmol/L (ref 98–111)
Chloride: 99 mmol/L (ref 98–111)
Creatinine, Ser: 2.75 mg/dL — ABNORMAL HIGH (ref 0.61–1.24)
Creatinine, Ser: 2.82 mg/dL — ABNORMAL HIGH (ref 0.61–1.24)
GFR, Estimated: 30 mL/min — ABNORMAL LOW (ref >60)
GFR, Estimated: 29 mL/min — ABNORMAL LOW (ref >60)
Glucose, Bld: 90 mg/dL (ref 70–99)
Glucose, Bld: 128 mg/dL — ABNORMAL HIGH (ref 70–99)
Phosphorus: 4.6 mg/dL (ref 2.5–4.6)
Phosphorus: 3.8 mg/dL (ref 2.5–4.6)
Potassium: 4.6 mmol/L (ref 3.5–5.1)
Potassium: 3.9 mmol/L (ref 3.5–5.1)
Sodium: 130 mmol/L — ABNORMAL LOW (ref 135–145)
Sodium: 129 mmol/L — ABNORMAL LOW (ref 135–145)

## 2020-07-30 LAB — APTT: aPTT: 30 seconds (ref 24–36)

## 2020-07-30 LAB — GLUCOSE, CAPILLARY
Glucose-Capillary: 101 mg/dL — ABNORMAL HIGH (ref 70–99)
Glucose-Capillary: 84 mg/dL (ref 70–99)
Glucose-Capillary: 98 mg/dL (ref 70–99)
Glucose-Capillary: 93 mg/dL (ref 70–99)
Glucose-Capillary: 82 mg/dL (ref 70–99)
Glucose-Capillary: 113 mg/dL — ABNORMAL HIGH (ref 70–99)

## 2020-07-30 LAB — ABO/RH: ABO/RH(D): O NEG

## 2020-07-30 LAB — MAGNESIUM: Magnesium: 2.7 mg/dL — ABNORMAL HIGH (ref 1.7–2.4)

## 2020-07-30 LAB — POTASSIUM
Potassium: 3.9 mmol/L (ref 3.5–5.1)
Potassium: 3.8 mmol/L (ref 3.5–5.1)
Potassium: 3.8 mmol/L (ref 3.5–5.1)

## 2020-07-30 MED ORDER — CLONAZEPAM 0.5 MG PO TABS
0.5000 mg | ORAL_TABLET | Freq: Two times a day (BID) | ORAL | Status: DC
  Administered 2020-07-30 – 2020-08-06 (×12): 0.5 mg via ORAL
  Filled 2020-07-30 (×14): qty 1

## 2020-07-30 MED ORDER — METOPROLOL TARTRATE 12.5 MG HALF TABLET
12.5000 mg | ORAL_TABLET | Freq: Once | ORAL | Status: AC
  Administered 2020-07-31: 12.5 mg via ORAL
  Filled 2020-07-30: qty 1

## 2020-07-30 MED ORDER — BISACODYL 5 MG PO TBEC
5.0000 mg | DELAYED_RELEASE_TABLET | Freq: Once | ORAL | Status: AC
  Administered 2020-07-30: 5 mg via ORAL
  Filled 2020-07-30: qty 1

## 2020-07-30 MED ORDER — TEMAZEPAM 15 MG PO CAPS: 15.0000 mg | ORAL_CAPSULE | Freq: Once | ORAL | Status: DC | PRN

## 2020-07-30 MED ORDER — CHLORHEXIDINE GLUCONATE 0.12 % MT SOLN
15.0000 mL | Freq: Once | OROMUCOSAL | Status: AC
  Administered 2020-07-31: 15 mL via OROMUCOSAL
  Filled 2020-07-30: qty 15

## 2020-07-30 MED ORDER — CHLORHEXIDINE GLUCONATE CLOTH 2 % EX PADS
6.0000 | MEDICATED_PAD | Freq: Once | CUTANEOUS | Status: AC
  Administered 2020-07-31: 6 via TOPICAL

## 2020-07-30 MED ORDER — PRISMASOL BGK 0/2.5 32-2.5 MEQ/L EC SOLN
Status: DC
  Filled 2020-07-30 (×5): qty 5000

## 2020-07-30 MED ORDER — CHLORHEXIDINE GLUCONATE CLOTH 2 % EX PADS
6.0000 | MEDICATED_PAD | Freq: Once | CUTANEOUS | Status: AC
  Administered 2020-07-30: 6 via TOPICAL

## 2020-07-30 MED ORDER — SODIUM ZIRCONIUM CYCLOSILICATE 10 G PO PACK
10.0000 g | PACK | Freq: Two times a day (BID) | ORAL | Status: DC
  Administered 2020-07-30: 10 g
  Filled 2020-07-30 (×2): qty 1

## 2020-07-30 NOTE — Procedures (Signed)
Admit: 07/18/2020 LOS: 72  55M AV IE and perivalvular abscess, serrata sp.  For AVR repair next week;  Anuric AKI from ATN  Current CRRT Prescription: Start Date: Restarted 5/6, first RRT4/28 Catheter: R IJ Temp HC Cath placed 07/28/20 by CCM BFR: 200-250 Pre Blood Pump: 1000 4K DFR: 1500 2K Replacement Rate: 300 2K Goal UF: 70m/h net neg Anticoagulation: Fixed dose heparin 500 units an hour in the CRRT circuit Clotting: Improved  S: Potassium increased again overnight, dialysate changed to 2K K is 4.6 this morning Vital signs are stable, patient is more awake, alert, and appropriate   O: 05/07 0701 - 05/08 0700 In: 1616.7 [P.O.:970; I.V.:446.7; IV Piggyback:200] Out: 2108   [facility]ights   07/28/20 0500 07/29/20 0241 07/30/20 0416  Weight: 116.5 kg 118.5 kg 115.2 kg    Recent Labs  Lab 07/29/20 0308 07/29/20 1645 07/30/20 0113 07/30/20 0928  NA 131* 129* 130*  --   K 5.1 5.6* 4.6 3.9  CL 97* 97* 100  --   CO2 22 19* 22  --   GLUCOSE 87 110* 90  --   BUN 79* 62* 53*  --   CREATININE 4.03* 3.11* 2.75*  --   CALCIUM 8.3* 8.1* 8.1*  --   PHOS 8.7* 6.1* 4.6  --    Recent Labs  Lab 07/27/20 0413 07/28/20 0727 07/28/20 0815 07/28/20 1040 07/29/20 0449  WBC 18.1* 23.4*  --   --  30.0*  HGB 8.2* 8.7* 8.2* 7.1* 7.4*  HCT 26.2* 26.5* 24.0* 21.0* 23.4*  MCV 89.1 81.5  --   --  87.0  PLT 191 224  --   --  291    Scheduled Meds: . chlorhexidine gluconate (MEDLINE KIT)  15 mL Mouth Rinse BID  . Chlorhexidine Gluconate Cloth  6 each Topical Daily  . clonazePAM  1 mg Oral BID  . docusate sodium  100 mg Oral BID  . [START ON 07/31/2020] epinephrine  0-10 mcg/min Intravenous To OR  . feeding supplement (NEPRO CARB STEADY)  237 mL Oral BID BM  . fenofibrate  160 mg Oral Daily  . heparin injection (subcutaneous)  5,000 Units Subcutaneous Q8H  . [START ON 07/31/2020] heparin-papaverine-plasmalyte irrigation   Irrigation To OR  . [START ON 07/31/2020] insulin   Intravenous  To OR  . [START ON 07/31/2020] [place] Blood Cardioplegia vial (lidocaine/magnesium/mannitol 0.26g-4g-6.4g)   Intracoronary Once  . melatonin  3 mg Oral QHS  . multivitamin  1 tablet Oral QHS  . pantoprazole  40 mg Oral Daily  . [START ON 07/31/2020] phenylephrine  30-200 mcg/min Intravenous To OR  . polyethylene glycol  17 g Oral Daily  . [START ON 07/31/2020] potassium chloride  80 mEq Other To OR  . sodium chloride flush  10-40 mL Intracatheter Q12H  . [START ON 07/31/2020] tranexamic acid  15 mg/kg Intravenous To OR  . [START ON 07/31/2020] tranexamic acid  2 mg/kg Intracatheter To OR   Continuous Infusions: . sodium chloride 20 mL/hr at 07/30/20 1000  . [START ON 07/31/2020]  ceFAZolin (ANCEF) IV    . [START ON 07/31/2020]  ceFAZolin (ANCEF) IV    . ceFEPime (MAXIPIME) IV 2 g (07/30/20 1018)  . [START ON 07/31/2020] dexmedetomidine    . heparin 10,000 units/ 20 mL infusion syringe 500 Units/hr (07/30/20 0235)  . [START ON 07/31/2020] heparin 30,000 units/NS 1000 mL solution for CELLSAVER    . [START ON 07/31/2020] milrinone    . [START ON 07/31/2020] nitroGLYCERIN    . [  START ON 07/31/2020] norepinephrine    . prismasol BGK 2/2.5 dialysis solution 1,500 mL/hr at 07/30/20 1012  . prismasol BGK 2/2.5 replacement solution 300 mL/hr at 07/29/20 1813  . prismasol BGK 2/2.5 replacement solution    . [START ON 07/31/2020] tranexamic acid (CYKLOKAPRON) infusion ([facility])    . [START ON 07/31/2020] vancomycin     PRN Meds:.acetaminophen (TYLENOL) oral liquid 160 mg/5 mL, acetaminophen, fentaNYL, haloperidol lactate, heparin, heparin, QUEtiapine, sodium chloride, sodium chloride flush  ABG    Component Value Date/Time   PHART 7.484 (H) 07/28/2020 1040   PCO2ART 32.3 07/28/2020 1040   PO2ART 148 (H) 07/28/2020 1040   HCO3 24.3 07/28/2020 1040   TCO2 25 07/28/2020 1040   ACIDBASEDEF 7.3 (H) 07/20/2020 1530   O2SAT 99.0 07/28/2020 1040    A/P  1. 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 Hyperkalemia 07/28/2020, severe, improved 3. 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 VDRF, resolved, normal intermittently on BiPAP 5. Anemia, transfusion per CCM 6. Shock, resolved  He has a tendency towards hyperkalemia when off of dialysis In anticipation of prolonged surgery tomorrow would like his potassium to be between 3.5 and 4.0 at the time of going to the OR Changed to all 2K dialysate today, target potassium between 3.5 and 4, discussed with nursing and CCM Cont fixed dose heparin 500 units an hour to the CRRT circuit  Pearson Grippe, MD [employer]

## 2020-07-30 NOTE — Progress Notes (Signed)
Late entry.  At 1245 CRRT machine was alarming.  PT HD catheter had partially come out.  This RN stopped CRRT and informed Dr. Carlis Abbott.  Educated pt on not turning all the way over in bed to avoid tension of HD catheter.  Pt verbalized understanding.      Irven Baltimore, RN

## 2020-07-30 NOTE — Procedures (Signed)
Central Venous Catheter Insertion Procedure Note  Alex [name]  [id number]  Aug 25, 1980  Date:07/30/20  Time:2:18 PM   Provider Performing:Leonda Cristo Mauricio Po   Procedure: Insertion of Non-tunneled Central Venous Catheter(36556)with US guidance [id number])    Indication(s) Hemodialysis  Consent Risks of the procedure as well as the alternatives and risks of each were explained to the patient and/or caregiver.  Consent for the procedure was obtained and is signed in the bedside chart  Anesthesia Topical only with 1% lidocaine   Timeout Verified patient identification, verified procedure, site/side was marked, verified correct patient position, special equipment/implants available, medications/allergies/relevant history reviewed, required imaging and test results available.  Sterile Technique Maximal sterile technique including full sterile barrier drape, hand hygiene, sterile gown, sterile gloves, mask, hair covering, sterile ultrasound probe cover (if used).  Procedure Description Area of catheter insertion was cleaned with chlorhexidine and draped in sterile fashion.   With real-time ultrasound guidance a HD catheter was placed into the left internal jugular vein.  Nonpulsatile blood flow and easy flushing noted in all ports.  The catheter was sutured in place and sterile dressing applied.  Complications/Tolerance None; patient tolerated the procedure well. Chest X-ray is ordered to verify placement for internal jugular or subclavian cannulation.  Chest x-ray is not ordered for femoral cannulation.  EBL Minimal  Specimen(s) None  Lenice Llamas, MD [facility] [place]

## 2020-07-30 NOTE — Progress Notes (Signed)
NAME:  Alex [name], MRN:  [id number], DOB:  Jun 07, 1980, LOS: 12 ADMISSION DATE:  07/18/2020, CONSULTATION DATE:  4/26 REFERRING MD:  Dr. Florina Ou, CHIEF COMPLAINT:  AMS   History of Present Illness:  40 yo male smoker brought to [facility] ER with altered mental status.  Noted to have headache and feeling feverish before admission.  Found to have hypotension, AKI, thrombocytopenia, splenomegaly, lactic acidosis, elevated bilirubin, hypoglycemia.  Started on antibiotics and pressors for possible sepsis, but no clear source of infection.  Normal renal function from February 2022.  Uses suboxone for history of substance abuse.  UDS positive for amphetamines.  Transfer to [facility] for further therapy.  Hx from chart, medical team, and pt's mother.  Pertinent  Medical History  Polysubstance Abuse - amphetamines, heroin. On Suboxone.   Ruptured Disc  Significant Hospital Events: Including procedures, antibiotic start and stop dates in addition to other pertinent events   . 4/26 transfer from [facility] to [facility] . CT head 4/26 >> no acute findings . CT abd/pelvis 4/26 >> cholelithiasis w/o inflammatory chagnes, mild hepatomegaly (25 cm), moderate splenomegaly (16.7 cm), 2 mm non obstructing kidney stone on Lt . 4/27 CVL placed. Pressors escalating.  . 4/28 intubated for TEE, CVC, HD, and A-line placed. CRRT started  . Remains on CRRT . MRI on 4/30-multiple infarcts . 5/4: repeat TEE shows an aortic valve abscess . 5/5: extubated , self-removed HD cath so CRRT holiday> developed severe hyperkalemia overnight. . 5/6: new temp HD cath placed   Interim History / Subjective:  Hyperkalemic yesterday afternoon, had to be reduced to 2K+ bath. He denies complaints. Wants to get out of bed.  Objective   Blood pressure (!) 103/59, pulse (!) 104, temperature (!) 96.8 F (36 C), temperature source Axillary, resp. rate (!) 23, height '6\' 4"'$  (1.93 m), weight 115.2 kg, SpO2 90 %.    Vent Mode: BIPAP;PCV;Stand-by    Intake/Output Summary (Last 24 hours) at 07/30/2020 0932 Last data filed at 07/30/2020 0900 Gross per 24 hour  Intake 1576.72 ml  Output 2120 ml  Net -543.28 ml   Filed Weights   07/28/20 0500 07/29/20 0241 07/30/20 0416  Weight: 116.5 kg 118.5 kg 115.2 kg    Examination:  General: Young man lying in bed no acute distress HEENT: [place]/AT, eyes anicteric Neuro: Awake and alert, answering all questions appropriately.  Moving all extremities.  Some shivering. CV: S1-S2, regular rate and rhythm.  Diastolic murmur. PULM: CTAB, breathing comfortably nasal cannula. GI: Nondistended, soft Extremities: No edema, no cyanosis Skin: RIJ trialysis catheter without erythema, no rashes.  Multiple tattoos.  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  Tentatively planning for or tomorrow.  -Appreciate ID and CTS input  Acute hypoxic respiratory failure Extubated on 5/5, tolerated well.  -Titrating off supplemental oxygen -Pulmonary hygiene  Hyperkalemia> resolved on CRRT -second temp cath placed, will eventually need tunneled access -Continue close monitoring -Appreciate nephrology's recommendations> we will plan on keeping him on CRRT until he goes to the operating room unless surgery is delayed and trials of transitioning to intermittent HD can be accomplished  Acute kidney injury  Anuria Likely ischemic ATN in the setting of septic shock and septic emboli.  Requiring CRRT since 4/28, however traumatically pulled out HD line on 5/5. Getting a tunneled HD  catheter today, may need dialysis after.  -bladder scans Qshift to monitor for urine production -CRRT today.  Acute anemia Thrombocytopenia resolved -transfuse for Hb <7 or hemodynamically significant bleeding -intermittent CBC  Leukocytosis, worsening-- unclear etiolgoy -repeat blood cultures pending from 5/7  Polysubstance abuse -Suboxone, amphetamines -Cessation education has been provided  Agitation, impulsiveness -OOB to chair today  Best practice   Diet:  Oral  Pain/Anxiety/Delirium protocol (if indicated): No VAP protocol (if indicated): Not indicated DVT prophylaxis: Subcutaneous Heparin and SCD GI prophylaxis: PPI Glucose control:  SSI No Central venous access:  N/A Arterial line:  N/A Foley:  N/A Mobility:  bed rest  PT consulted: N/A Last date of multidisciplinary goals of care discussion: mother updated at bedside 5/7 Code Status:  full code Disposition: ICU for CRRT    This patient is critically ill with multiple organ system failure which requires frequent high complexity decision making, assessment, support, evaluation, and titration of therapies. This was completed through the application of advanced monitoring technologies and extensive interpretation of multiple databases. During this encounter critical care time was devoted to patient care services described in this note for 33 minutes.   Julian Hy, DO 07/30/20 9:32 AM [place] [facility]

## 2020-07-31 ENCOUNTER — Other Ambulatory Visit (HOSPITAL_COMMUNITY): Payer: 59

## 2020-07-31 ENCOUNTER — Inpatient Hospital Stay (HOSPITAL_COMMUNITY): Payer: 59

## 2020-07-31 DIAGNOSIS — R34 Anuria and oliguria: Secondary | ICD-10-CM | POA: Diagnosis not present

## 2020-07-31 DIAGNOSIS — N179 Acute kidney failure, unspecified: Secondary | ICD-10-CM | POA: Diagnosis not present

## 2020-07-31 DIAGNOSIS — I358 Other nonrheumatic aortic valve disorders: Secondary | ICD-10-CM | POA: Diagnosis not present

## 2020-07-31 LAB — RENAL FUNCTION PANEL
Albumin: 1.7 g/dL — ABNORMAL LOW (ref 3.5–5.0)
Albumin: 1.7 g/dL — ABNORMAL LOW (ref 3.5–5.0)
Anion gap: 8 (ref 5–15)
Anion gap: 10 (ref 5–15)
BUN: 42 mg/dL — ABNORMAL HIGH (ref 6–20)
BUN: 42 mg/dL — ABNORMAL HIGH (ref 6–20)
CO2: 22 mmol/L (ref 22–32)
CO2: 20 mmol/L — ABNORMAL LOW (ref 22–32)
Calcium: 7.9 mg/dL — ABNORMAL LOW (ref 8.9–10.3)
Calcium: 7.7 mg/dL — ABNORMAL LOW (ref 8.9–10.3)
Chloride: 97 mmol/L — ABNORMAL LOW (ref 98–111)
Chloride: 99 mmol/L (ref 98–111)
Creatinine, Ser: 2.61 mg/dL — ABNORMAL HIGH (ref 0.61–1.24)
Creatinine, Ser: 3.02 mg/dL — ABNORMAL HIGH (ref 0.61–1.24)
GFR, Estimated: 31 mL/min — ABNORMAL LOW (ref >60)
GFR, Estimated: 26 mL/min — ABNORMAL LOW (ref >60)
Glucose, Bld: 93 mg/dL (ref 70–99)
Glucose, Bld: 115 mg/dL — ABNORMAL HIGH (ref 70–99)
Phosphorus: 3.7 mg/dL (ref 2.5–4.6)
Phosphorus: 4 mg/dL (ref 2.5–4.6)
Potassium: 3.4 mmol/L — ABNORMAL LOW (ref 3.5–5.1)
Potassium: 4 mmol/L (ref 3.5–5.1)
Sodium: 127 mmol/L — ABNORMAL LOW (ref 135–145)
Sodium: 129 mmol/L — ABNORMAL LOW (ref 135–145)

## 2020-07-31 LAB — COMPREHENSIVE METABOLIC PANEL
ALT: 40 U/L (ref 0–44)
AST: 36 U/L (ref 15–41)
Albumin: 1.7 g/dL — ABNORMAL LOW (ref 3.5–5.0)
Alkaline Phosphatase: 63 U/L (ref 38–126)
Anion gap: 8 (ref 5–15)
BUN: 41 mg/dL — ABNORMAL HIGH (ref 6–20)
CO2: 24 mmol/L (ref 22–32)
Calcium: 7.8 mg/dL — ABNORMAL LOW (ref 8.9–10.3)
Chloride: 97 mmol/L — ABNORMAL LOW (ref 98–111)
Creatinine, Ser: 3.16 mg/dL — ABNORMAL HIGH (ref 0.61–1.24)
GFR, Estimated: 25 mL/min — ABNORMAL LOW (ref >60)
Glucose, Bld: 130 mg/dL — ABNORMAL HIGH (ref 70–99)
Potassium: 3.3 mmol/L — ABNORMAL LOW (ref 3.5–5.1)
Sodium: 129 mmol/L — ABNORMAL LOW (ref 135–145)
Total Bilirubin: 1.1 mg/dL (ref 0.3–1.2)
Total Protein: 7.3 g/dL (ref 6.5–8.1)

## 2020-07-31 LAB — BPAM PLATELET PHERESIS
Blood Product Expiration Date: 202205092359
Blood Product Expiration Date: 202205092359
ISSUE DATE / TIME: 202205082243
Unit Type and Rh: 600
Unit Type and Rh: 600

## 2020-07-31 LAB — POCT I-STAT 7, (LYTES, BLD GAS, ICA,H+H)
Acid-Base Excess: 2 mmol/L (ref 0.0–2.0)
Bicarbonate: 25.5 mmol/L (ref 20.0–28.0)
Calcium, Ion: 1.14 mmol/L — ABNORMAL LOW (ref 1.15–1.40)
HCT: 17 % — ABNORMAL LOW (ref 39.0–52.0)
Hemoglobin: 5.8 g/dL — CL (ref 13.0–17.0)
O2 Saturation: 96 %
Patient temperature: 98
Potassium: 3.2 mmol/L — ABNORMAL LOW (ref 3.5–5.1)
Sodium: 134 mmol/L — ABNORMAL LOW (ref 135–145)
TCO2: 26 mmol/L (ref 22–32)
pCO2 arterial: 33 mmHg (ref 32.0–48.0)
pH, Arterial: 7.494 — ABNORMAL HIGH (ref 7.350–7.450)
pO2, Arterial: 74 mmHg — ABNORMAL LOW (ref 83.0–108.0)

## 2020-07-31 LAB — PREPARE PLATELET PHERESIS
Unit division: 0
Unit division: 0

## 2020-07-31 LAB — PROTIME-INR
INR: 1.2 (ref 0.8–1.2)
Prothrombin Time: 14.7 seconds (ref 11.4–15.2)

## 2020-07-31 LAB — CBC
HCT: 20.2 % — ABNORMAL LOW (ref 39.0–52.0)
Hemoglobin: 6.4 g/dL — CL (ref 13.0–17.0)
MCH: 27.6 pg (ref 26.0–34.0)
MCHC: 31.7 g/dL (ref 30.0–36.0)
MCV: 87.1 fL (ref 80.0–100.0)
Platelets: 217 10*3/uL (ref 150–400)
RBC: 2.32 MIL/uL — ABNORMAL LOW (ref 4.22–5.81)
RDW: 18.8 % — ABNORMAL HIGH (ref 11.5–15.5)
WBC: 10.7 10*3/uL — ABNORMAL HIGH (ref 4.0–10.5)
nRBC: 0 % (ref 0.0–0.2)

## 2020-07-31 LAB — HEMOGLOBIN AND HEMATOCRIT, BLOOD
HCT: 19.6 % — ABNORMAL LOW (ref 39.0–52.0)
Hemoglobin: 6.4 g/dL — CL (ref 13.0–17.0)

## 2020-07-31 LAB — GLUCOSE, CAPILLARY
Glucose-Capillary: 87 mg/dL (ref 70–99)
Glucose-Capillary: 91 mg/dL (ref 70–99)
Glucose-Capillary: 112 mg/dL — ABNORMAL HIGH (ref 70–99)
Glucose-Capillary: 103 mg/dL — ABNORMAL HIGH (ref 70–99)
Glucose-Capillary: 107 mg/dL — ABNORMAL HIGH (ref 70–99)
Glucose-Capillary: 100 mg/dL — ABNORMAL HIGH (ref 70–99)

## 2020-07-31 LAB — PREPARE RBC (CROSSMATCH)

## 2020-07-31 LAB — POTASSIUM: Potassium: 3.4 mmol/L — ABNORMAL LOW (ref 3.5–5.1)

## 2020-07-31 LAB — MAGNESIUM: Magnesium: 2.4 mg/dL (ref 1.7–2.4)

## 2020-07-31 LAB — APTT: aPTT: 28 seconds (ref 24–36)

## 2020-07-31 MED ORDER — PRISMASOL BGK 4/2.5 32-4-2.5 MEQ/L REPLACEMENT SOLN
Status: DC
  Filled 2020-07-31 (×6): qty 5000

## 2020-07-31 MED ORDER — SODIUM CHLORIDE 0.9% IV SOLUTION: Freq: Once | INTRAVENOUS | Status: AC

## 2020-07-31 MED ORDER — PRISMASOL BGK 4/2.5 32-4-2.5 MEQ/L EC SOLN
Status: DC
  Filled 2020-07-31 (×30): qty 5000

## 2020-07-31 MED ORDER — CEFAZOLIN SODIUM-DEXTROSE 2-4 GM/100ML-% IV SOLN
2.0000 g | INTRAVENOUS | Status: AC
  Administered 2020-08-01: 2 g via INTRAVENOUS
  Filled 2020-07-31: qty 100

## 2020-07-31 MED ORDER — PRISMASOL BGK 4/2.5 32-4-2.5 MEQ/L REPLACEMENT SOLN
Status: DC
  Filled 2020-07-31 (×10): qty 5000

## 2020-07-31 NOTE — Progress Notes (Signed)
Dr. Jonnie Finner made aware about potassium of 3.4

## 2020-07-31 NOTE — Progress Notes (Signed)
[facility] for Infectious Disease    Date of Admission:  07/18/2020   Total days of antibiotics            ID: Alex [name] is a 40 y.o. 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 Getting restarted on CRRT.   - leukocytosis resolved  Medications:  . chlorhexidine gluconate (MEDLINE KIT)  15 mL Mouth Rinse BID  . Chlorhexidine Gluconate Cloth  6 each Topical Daily  . clonazePAM  0.5 mg Oral BID  . docusate sodium  100 mg Oral BID  . feeding supplement (NEPRO CARB STEADY)  237 mL Oral BID BM  . fenofibrate  160 mg Oral Daily  . heparin injection (subcutaneous)  5,000 Units Subcutaneous Q8H  . melatonin  3 mg Oral QHS  . multivitamin  1 tablet Oral QHS  . pantoprazole  40 mg Oral Daily  . polyethylene glycol  17 g Oral Daily  . sodium chloride flush  10-40 mL Intracatheter Q12H    Objective: Vital signs in last 24 hours: Temp:  [97.4 F (36.3 C)-98.3 F (36.8 C)] 98.3 F (36.8 C) (05/09 0745) Pulse Rate:  [78-101] 85 (05/09 1200) Resp:  [15-28] 19 (05/09 1200) BP: (88-123)/(25-95) 107/41 (05/09 1200) SpO2:  [80 %-100 %] 100 % (05/09 1200) Weight:  [100.2 kg] 100.2 kg (05/09 0437) Physical Exam  Constitutional: He is oriented to person, place, and time. He appears well-developed and well-nourished. No distress.  HENT:  Mouth/Throat: Oropharynx is clear and moist. No oropharyngeal exudate.  Neck = ij HD catheter covered Cardiovascular: Normal rate, regular rhythm and normal heart sounds. Exam reveals no gallop and no friction rub.  No murmur heard.  Pulmonary/Chest: Effort  normal and breath sounds normal. No respiratory distress. He has no wheezes.  Abdominal: Soft. Bowel sounds are normal. He exhibits no distension. There is no tenderness.  Lymphadenopathy:  He has no cervical adenopathy.  Neurological: He is alert and oriented to person, place, and time.  Skin: Skin is warm and dry. No rash noted. No erythema.  Psychiatric: He has a normal mood and affect. His behavior is normal.     Lab Results Recent Labs    07/29/20 0449 07/29/20 1645 07/31/20 0156 07/31/20 0342 07/31/20 1109  WBC 30.0*  --  10.7*  --   --   HGB 7.4*  --  6.4* 5.8* 6.4*  HCT 23.4*  --  20.2* 17.0* 19.6*  NA  --    < > 127* 134* 129*  K  --    < > 3.4* 3.2* 3.3*  3.4*  CL  --    < > 97*  --  97*  CO2  --    < > 22  --  24  BUN  --    < > 42*  --  41*  CREATININE  --    < > 2.61*  --  3.16*   < > = values in this interval not displayed.   Liver Panel Recent Labs    07/31/20 0156 07/31/20 1109  PROT  --  7.3  ALBUMIN 1.7* 1.7*  AST  --  36  ALT  --  40  ALKPHOS  --  63  BILITOT  --  1.1   Sedimentation Rate No results for input(s): ESRSEDRATE in the last 72 hours. C-Reactive Protein No results for input(s): CRP in the last 72 hours.  Microbiology: Blood cx 5/7- NGTD Studies/Results: DG Chest 1 View  Result Date: 07/30/2020 CLINICAL DATA:  Central line placement. EXAM: CHEST  1 VIEW COMPARISON:  One-view chest x-ray 07/28/2020 FINDINGS: Heart size is normal.  Lung volumes are low.  Lungs are clear. Right IJ line was removed. Left IJ line terminates in the mid SVC. No pneumothorax is present. IMPRESSION: Interval placement of left IJ line terminating in the mid SVC. No pneumothorax. Electronically Signed   By: San Morelle M.D.   On: 07/30/2020 14:59     Assessment/Plan: Serratia aortic valve endocarditis = continue on cefepime. Anticipate to restart antibiotic clock after he goes to surgery. Awaiting dental evaluation and management if any teeth  extractions need to be done.  [facility] for Infectious Diseases Cell: [phone] Pager: [phone]  07/31/2020, 2:28 PM

## 2020-07-31 NOTE — Progress Notes (Signed)
ABG collected and results called in to [facility].  

## 2020-07-31 NOTE — Progress Notes (Signed)
Admit: 07/18/2020 LOS: 41  43M AV IE and perivalvular abscess, serrata sp.  For AVR repair next week;  Anuric AKI from ATN-  Normal crt in Feb 2022 then 5 on 07/18/20 in the setting of IE  Current CRRT Prescription: Start Date: Restarted 5/6, first RRT4/28 Catheter: R IJ Temp HC Cath placed 07/28/20 by CCM BFR: 200-250 Pre Blood Pump: 1000 4K DFR: 1500 2K Replacement Rate: 300 2K Goal UF: 27m/h net neg Anticoagulation: Fixed dose heparin 500 units an hour in the CRRT circuit Clotting: Improved  S: Potassium now lower K is 3.2 this morning Vital signs are stable, patient is more awake, alert, and appropriate-  Basically ran even overnight-  No UOP though Have heard that his surgery is cancelled for today-  Will be on Wednesday-  Will be taken off of CRRT for a couple of hours today    O: 05/08 0701 - 05/09 0700 In: 2032.3 [P.O.:834; I.V.:630.8; Blood:367.5; IV Piggyback:200] Out: 1880   Filed Weights   07/29/20 0241 07/30/20 0416 07/31/20 0437  Weight: 118.5 kg 115.2 kg 100.2 kg    Recent Labs  Lab 07/30/20 0113 07/30/20 0928 07/30/20 1800 07/30/20 2103 07/31/20 0156 07/31/20 0342  NA 130*  --  129*  --  127* 134*  K 4.6   < > 3.9 3.8 3.4* 3.2*  CL 100  --  99  --  97*  --   CO2 22  --  24  --  22  --   GLUCOSE 90  --  128*  --  93  --   BUN 53*  --  49*  --  42*  --   CREATININE 2.75*  --  2.82*  --  2.61*  --   CALCIUM 8.1*  --  7.8*  --  7.9*  --   PHOS 4.6  --  3.8  --  3.7  --    < > = values in this interval not displayed.   Recent Labs  Lab 07/28/20 0727 07/28/20 0815 07/29/20 0449 07/31/20 0156 07/31/20 0342  WBC 23.4*  --  30.0* 10.7*  --   HGB 8.7*   < > 7.4* 6.4* 5.8*  HCT 26.5*   < > 23.4* 20.2* 17.0*  MCV 81.5  --  87.0 87.1  --   PLT 224  --  291 217  --    < > = values in this interval not displayed.    Scheduled Meds: . chlorhexidine gluconate (MEDLINE KIT)  15 mL Mouth Rinse BID  . Chlorhexidine Gluconate Cloth  6 each Topical Daily   . clonazePAM  0.5 mg Oral BID  . docusate sodium  100 mg Oral BID  . epinephrine  0-10 mcg/min Intravenous To OR  . feeding supplement (NEPRO CARB STEADY)  237 mL Oral BID BM  . fenofibrate  160 mg Oral Daily  . heparin injection (subcutaneous)  5,000 Units Subcutaneous Q8H  . heparin-papaverine-plasmalyte irrigation   Irrigation To OR  . insulin   Intravenous To OR  . [place] Blood Cardioplegia vial (lidocaine/magnesium/mannitol 0.26g-4g-6.4g)   Intracoronary Once  . melatonin  3 mg Oral QHS  . multivitamin  1 tablet Oral QHS  . pantoprazole  40 mg Oral Daily  . phenylephrine  30-200 mcg/min Intravenous To OR  . polyethylene glycol  17 g Oral Daily  . potassium chloride  80 mEq Other To OR  . sodium chloride flush  10-40 mL Intracatheter Q12H  . sodium zirconium cyclosilicate  10  g Per Tube BID  . tranexamic acid  15 mg/kg Intravenous To OR  . tranexamic acid  2 mg/kg Intracatheter To OR   Continuous Infusions: . sodium chloride Stopped (07/31/20 0702)  .  ceFAZolin (ANCEF) IV    .  ceFAZolin (ANCEF) IV    . ceFEPime (MAXIPIME) IV Stopped (07/30/20 2233)  . dexmedetomidine    . heparin 10,000 units/ 20 mL infusion syringe 500 Units/hr (07/30/20 0235)  . heparin 30,000 units/NS 1000 mL solution for CELLSAVER    . milrinone    . nitroGLYCERIN    . norepinephrine    . prismasol BGK 2/2.5 dialysis solution 1,500 mL/hr at 07/31/20 0532  . prismasol BGK 2/2.5 replacement solution 300 mL/hr at 07/31/20 0658  . prismasol BGK 2/2.5 replacement solution 500 mL/hr at 07/31/20 0136  . tranexamic acid (CYKLOKAPRON) infusion ([facility])    . vancomycin     PRN Meds:.acetaminophen (TYLENOL) oral liquid 160 mg/5 mL, acetaminophen, fentaNYL, haloperidol lactate, heparin, heparin, QUEtiapine, sodium chloride, sodium chloride flush, temazepam  ABG    Component Value Date/Time   PHART 7.494 (H) 07/31/2020 0342   PCO2ART 33.0 07/31/2020 0342   PO2ART 74 (L) 07/31/2020 0342   HCO3 25.5  07/31/2020 0342   TCO2 26 07/31/2020 0342   ACIDBASEDEF 7.3 (H) 07/20/2020 1530   O2SAT 96.0 07/31/2020 0342    A/P  1. 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  oligoanuric right now which is not a good sign.  Planning on keeping on CRRT thru the perioperative period given issues with hyperkalemia and anticipated hemodynamic changes 2. Hyperkalemia 07/28/2020, severe, improved- now need to change back to 4 k bath.  I see issue with intermittent hyperk-  Will do 4 k today , then may need 2 k in prep for surgery on 5/11 3. Serratia aortic valve endocarditis with perivalvular abscess on cefepime, per ID and T CTS, for surgical repair  now 5/11.  WBC down 4. VDRF, resolved, normal intermittently on BiPAP 5. Anemia, transfusion per CCM-  Needs ESA as well  6. Shock, resolved  He has a tendency towards hyperkalemia when off of dialysis But, Changed to all 4K dialysate today, target potassium between 3.5 and 4, discussed with nursing and CCM so may need 2 k later Cont fixed dose heparin 500 units an hour to the CRRT circuit  [employer]

## 2020-07-31 NOTE — Progress Notes (Signed)
NAME:  Alex [name], MRN:  [id number], DOB:  Dec 06, 1980, LOS: 11 ADMISSION DATE:  07/18/2020, CONSULTATION DATE:  4/26 REFERRING MD:  Dr. Florina Ou, CHIEF COMPLAINT:  AMS   History of Present Illness:  40 yo male smoker brought to [facility] ER with altered mental status.  Noted to have headache and feeling feverish before admission.  Found to have hypotension, AKI, thrombocytopenia, splenomegaly, lactic acidosis, elevated bilirubin, hypoglycemia.  Started on antibiotics and pressors for possible sepsis, but no clear source of infection.  Normal renal function from February 2022.  Uses suboxone for history of substance abuse.  UDS positive for amphetamines.  Transfer to [facility] for further therapy.  Hx from chart, medical team, and pt's mother.  Pertinent  Medical History  Polysubstance Abuse - amphetamines, heroin. On Suboxone.   Ruptured Disc  Significant Hospital Events: Including procedures, antibiotic start and stop dates in addition to other pertinent events   . 4/26 transfer from [facility] to [facility] . CT head 4/26 >> no acute findings . CT abd/pelvis 4/26 >> cholelithiasis w/o inflammatory chagnes, mild hepatomegaly (25 cm), moderate splenomegaly (16.7 cm), 2 mm non obstructing kidney stone on Lt . 4/27 CVL placed. Pressors escalating.  . 4/28 intubated for TEE, CVC, HD, and A-line placed. CRRT started  . Remains on CRRT . MRI on 4/30-multiple infarcts . 5/4: repeat TEE shows an aortic valve abscess . 5/5: extubated , self-removed HD cath so CRRT holiday> developed severe hyperkalemia overnight. . 5/6: new temp HD cath placed  . 5/8: new temp HD cath placed after prior one self-removed by patient  Interim History / Subjective:  Scheduled for [facility]. Discussed with CT surgery who will plan to take patient to OR when imaging complete. Transfused 1U for anemia overnight.  Objective   Blood pressure (!) 100/33, pulse 82, temperature 98.1 F (36.7 C), resp. rate (!) 23, height '6\' 4"'$  (1.93 m),  weight 100.2 kg, SpO2 100 %.        Intake/Output Summary (Last 24 hours) at 07/31/2020 0827 Last data filed at 07/31/2020 0800 Gross per 24 hour  Intake 1684.04 ml  Output 2009 ml  Net -324.96 ml   Filed Weights   07/29/20 0241 07/30/20 0416 07/31/20 0437  Weight: 118.5 kg 115.2 kg 100.2 kg    Physical Exam: General: Well-appearing, no acute distress, on CRRT HENT: [place], AT, OP clear, MMM Eyes: EOMI, no scleral icterus Respiratory: Clear to auscultation bilaterally.  No crackles, wheezing or rales Cardiovascular: Diastolic murmur, regular rate, no JVD GI: BS+, soft, nontender Extremities:-Edema,-tenderness Neuro: AAO x4, CNII-XII grossly intact Skin: Intact, no rashes or bruising. Multiple tattoos   Labs/imaging that I havepersonally reviewed   K 3.2 WBC 10.7 - improved Hg 6.4>5.8 suspect loss related to CRRT  Imaging, labs and test noted above have been reviewed independently by me.  Resolved Hospital Problem list   Hyponatremia, septic shock, metabolic encephalopathy, acute hypoxic resp failure   Assessment & Plan:   Aortic valve endocarditis c/b perivalvular abscess and Serratia bacteremia and multiple embolic strokes  -Continue cefepime per ID -Scheduled for dental imaging prior to valve repair-- ordered by TCTS.  Tentatively planning for OR 5/11 -Appreciate ID and CTS input  Acute hypoxic respiratory failure Extubated on 5/5, tolerated well.  -Titrating off supplemental oxygen, goal SpO2 >90% -Pulmonary hygiene  Acute kidney injury Hyperkalemia Anuria Likely ischemic ATN in the setting of septic shock and septic emboli.  Requiring CRRT since 4/28, however traumatically pulled out HD line on 5/5 and  5/8 -Bladder scans Qshift to monitor for urine production -Continue CRRT per Nephro -Will need long-term access  Acute anemia Thrombocytopenia resolved -transfuse for Hb <7 or hemodynamically significant bleeding -intermittent CBC -Transfuse PRBC x 1U. F/u  CBC  Polysubstance abuse -Suboxone, amphetamines -Cessation education has been provided  Agitation, impulsiveness -OOB to chair today  Best practice   Diet:  Oral  Pain/Anxiety/Delirium protocol (if indicated): No VAP protocol (if indicated): Not indicated DVT prophylaxis: Subcutaneous Heparin and SCD GI prophylaxis: PPI Glucose control:  SSI No Central venous access:  N/A Arterial line:  N/A Foley:  N/A Mobility:  bed rest  PT consulted: N/A Last date of multidisciplinary goals of care discussion: mother updated at bedside 5/7 Code Status:  full code Disposition: ICU for CRRT  The patient is critically ill with multiple organ systems failure and requires high complexity decision making for assessment and support, frequent evaluation and titration of therapies, application of advanced monitoring technologies and extensive interpretation of multiple databases.  Independent Critical Care Time: 42 Minutes.   Rodman Pickle, M.D. [facility] Pulmonary/Critical Care Medicine 07/31/2020 8:27 AM   Please see Amion for pager number to reach on-call Pulmonary and Critical Care Team.

## 2020-07-31 NOTE — Progress Notes (Signed)
Physical Therapy Treatment Patient Details Name: Alex [name] MRN: [id number] DOB: [birth year] Today's Date: 07/31/2020    History of Present Illness 40 yo male smoker brought to [facility] ER 07/18/20 with altered mental status. Found to have hypotension, AKI, thrombocytopenia, splenomegaly, lactic acidosis, elevated bilirubin, hypoglycemia, polysubstance abuse. Treated for sepsis Intubated 4/28-5/5. CRRT initiated 4/28, discontinued when pt pulled out catheter on 5/5; TEE found endocarditis with aortic valve perforation; MRI brain showed Dozens of primarily punctate acute infarctions scattered throughout the cerebellum and both cerebral hemispheres consistent with microembolic infarctions, consistent with endocarditis    PT Comments    Patient now undergoing CRRT and therefore limited in ability to mobilize beyond transfer OOB to chair (which pt had done with nursing this morning!). Agreed to bed-level exercises, sitting EOB and standing EOB with pre-gait activities. Patient tolerated well (BP stable) reporting a generalized fatigue limiting further activity. Returned to supine.     Follow Up Recommendations  No PT follow up     Equipment Recommendations  None recommended by PT    Recommendations for Other Services       Precautions / Restrictions Precautions Precautions: Fall Precaution Comments: impulsivity; pt is 6'4"    Mobility  Bed Mobility Overal bed mobility: Needs Assistance Bed Mobility: Rolling;Supine to Sit;Sit to Supine Rolling: +2 for safety/equipment;Min guard (use of rail)   Supine to sit: Min assist Sit to supine: Min guard   General bed mobility comments: assist to raise torso from supine (to avoid rolling onto his left side with L IJ catheter.    Transfers Overall transfer level: Needs assistance Equipment used: None Transfers: Sit to/from Stand Sit to Stand: Min assist         General transfer comment: assist to rise and steady, pt took several side  steps to [facility]; repeated x 2; stood for max of 40 secons  Ambulation/Gait Ambulation/Gait assistance: Min assist Gait Distance (Feet): 2 Feet Assistive device: None Gait Pattern/deviations: Step-to pattern     General Gait Details: side step to his left-on CRRT and could not walk any farther with +!   Stairs             Wheelchair Mobility    Modified Rankin (Stroke Patients Only) Modified Rankin (Stroke Patients Only) Pre-Morbid Rankin Score: No symptoms Modified Rankin: Moderately severe disability     Balance Overall balance assessment: Needs assistance   Sitting balance-Leahy Scale: Fair     Standing balance support: No upper extremity supported Standing balance-Leahy Scale: Fair                              Cognition Arousal/Alertness: Awake/alert Behavior During Therapy: Impulsive;Restless Overall Cognitive Status: Impaired/Different from baseline Area of Impairment: Attention;Memory;Following commands;Safety/judgement;Awareness;Problem solving                 Orientation Level:  (NT) Current Attention Level: Sustained Memory: Decreased short-term memory;Decreased recall of precautions Following Commands: Follows one step commands with increased time Safety/Judgement: Decreased awareness of safety;Decreased awareness of deficits Awareness: Intellectual Problem Solving: Difficulty sequencing;Requires verbal cues;Slow processing General Comments: on CRRT; adhering to instructions to move slowly and mindfully due to L IJ catheter      Exercises General Exercises - Lower Extremity Ankle Circles/Pumps: AROM;Both;10 reps Heel Slides: AROM;Both;10 reps Hip ABduction/ADduction: AROM;Left;10 reps;Sidelying (clamshell) Straight Leg Raises: AROM;Both;10 reps Other Exercises Other Exercises: bridge with 5 sec hold x 10 reps    General Comments  Pertinent Vitals/Pain Pain Assessment: No/denies pain Faces Pain Scale: No hurt     Home Living                      Prior Function            PT Goals (current goals can now be found in the care plan section) Acute Rehab PT Goals Patient Stated Goal: to drink PT Goal Formulation: With patient Time For Goal Achievement: 08/10/20 Potential to Achieve Goals: Good Progress towards PT goals: Progressing toward goals    Frequency    Min 3X/week      PT Plan Current plan remains appropriate    Co-evaluation              AM-PAC PT "6 Clicks" Mobility   Outcome Measure  Help needed turning from your back to your side while in a flat bed without using bedrails?: None Help needed moving from lying on your back to sitting on the side of a flat bed without using bedrails?: A Little Help needed moving to and from a bed to a chair (including a wheelchair)?: A Little Help needed standing up from a chair using your arms (e.g., wheelchair or bedside chair)?: A Little Help needed to walk in hospital room?: Total Help needed climbing 3-5 steps with a railing? : Total 6 Click Score: 15    End of Session Equipment Utilized During Treatment: Other (comment) (on CRRT) Activity Tolerance: Patient tolerated treatment well Patient left: in bed;with call bell/phone within reach;with family/visitor present Nurse Communication: Mobility status PT Visit Diagnosis: Unsteadiness on feet (R26.81);Other abnormalities of gait and mobility (R26.89)     Time: [id number] PT Time Calculation (min) (ACUTE ONLY): 27 min  Charges:  $Therapeutic Exercise: 8-22 mins $Therapeutic Activity: 8-22 mins                      Arby Barrette, PT Pager [phone]    [name] 07/31/2020, 4:20 PM

## 2020-07-31 NOTE — Progress Notes (Signed)
     [address]       [place] [address]             [phone]       Clinically stable. Satting well on nasal cannula. Remains on CRRT.  Awaiting dental clearance. We will plan for surgery on 08/02/2020.  Kohen Reither Bary Leriche

## 2020-07-31 NOTE — Progress Notes (Signed)
eLink Physician-Brief Progress Note Patient Name: Alex [name] DOB: 02-17-1981 MRN: [id number]   Date of Service  07/31/2020  HPI/Events of Note  Hgb 6.4.   eICU Interventions  Transfuse 1u pRBCs. Obtain post-transfusion H/H.     Intervention Category Intermediate Interventions: Other:  Charlott Rakes 07/31/2020, 3:02 AM

## 2020-08-01 ENCOUNTER — Inpatient Hospital Stay (HOSPITAL_COMMUNITY): Payer: 59

## 2020-08-01 DIAGNOSIS — R34 Anuria and oliguria: Secondary | ICD-10-CM | POA: Diagnosis not present

## 2020-08-01 DIAGNOSIS — I358 Other nonrheumatic aortic valve disorders: Secondary | ICD-10-CM | POA: Diagnosis not present

## 2020-08-01 DIAGNOSIS — N179 Acute kidney failure, unspecified: Secondary | ICD-10-CM | POA: Diagnosis not present

## 2020-08-01 HISTORY — PX: IR FLUORO GUIDE CV LINE RIGHT: IMG2283

## 2020-08-01 HISTORY — PX: IR US GUIDE VASC ACCESS RIGHT: IMG2390

## 2020-08-01 LAB — PREPARE RBC (CROSSMATCH)

## 2020-08-01 LAB — CBC
HCT: 18.8 % — ABNORMAL LOW (ref 39.0–52.0)
HCT: 20.2 % — ABNORMAL LOW (ref 39.0–52.0)
Hemoglobin: 6 g/dL — CL (ref 13.0–17.0)
Hemoglobin: 6.5 g/dL — CL (ref 13.0–17.0)
MCH: 28.3 pg (ref 26.0–34.0)
MCH: 28.1 pg (ref 26.0–34.0)
MCHC: 31.9 g/dL (ref 30.0–36.0)
MCHC: 32.2 g/dL (ref 30.0–36.0)
MCV: 88.7 fL (ref 80.0–100.0)
MCV: 87.4 fL (ref 80.0–100.0)
Platelets: 257 10*3/uL (ref 150–400)
Platelets: 259 10*3/uL (ref 150–400)
RBC: 2.12 MIL/uL — ABNORMAL LOW (ref 4.22–5.81)
RBC: 2.31 MIL/uL — ABNORMAL LOW (ref 4.22–5.81)
RDW: 17.6 % — ABNORMAL HIGH (ref 11.5–15.5)
RDW: 17.3 % — ABNORMAL HIGH (ref 11.5–15.5)
WBC: 9.5 10*3/uL (ref 4.0–10.5)
WBC: 9.4 10*3/uL (ref 4.0–10.5)
nRBC: 0 % (ref 0.0–0.2)
nRBC: 0 % (ref 0.0–0.2)

## 2020-08-01 LAB — POTASSIUM
Potassium: 3.7 mmol/L (ref 3.5–5.1)
Potassium: 3.9 mmol/L (ref 3.5–5.1)
Potassium: 3.6 mmol/L (ref 3.5–5.1)
Potassium: 3.7 mmol/L (ref 3.5–5.1)

## 2020-08-01 LAB — RENAL FUNCTION PANEL
Albumin: 1.6 g/dL — ABNORMAL LOW (ref 3.5–5.0)
Albumin: 1.7 g/dL — ABNORMAL LOW (ref 3.5–5.0)
Anion gap: 8 (ref 5–15)
Anion gap: 5 (ref 5–15)
BUN: 35 mg/dL — ABNORMAL HIGH (ref 6–20)
BUN: 35 mg/dL — ABNORMAL HIGH (ref 6–20)
CO2: 24 mmol/L (ref 22–32)
CO2: 25 mmol/L (ref 22–32)
Calcium: 7.9 mg/dL — ABNORMAL LOW (ref 8.9–10.3)
Calcium: 7.7 mg/dL — ABNORMAL LOW (ref 8.9–10.3)
Chloride: 99 mmol/L (ref 98–111)
Chloride: 100 mmol/L (ref 98–111)
Creatinine, Ser: 3.08 mg/dL — ABNORMAL HIGH (ref 0.61–1.24)
Creatinine, Ser: 3.26 mg/dL — ABNORMAL HIGH (ref 0.61–1.24)
GFR, Estimated: 26 mL/min — ABNORMAL LOW (ref >60)
GFR, Estimated: 24 mL/min — ABNORMAL LOW (ref >60)
Glucose, Bld: 99 mg/dL (ref 70–99)
Glucose, Bld: 97 mg/dL (ref 70–99)
Phosphorus: 3.1 mg/dL (ref 2.5–4.6)
Phosphorus: 3.8 mg/dL (ref 2.5–4.6)
Potassium: 3.6 mmol/L (ref 3.5–5.1)
Potassium: 3.7 mmol/L (ref 3.5–5.1)
Sodium: 131 mmol/L — ABNORMAL LOW (ref 135–145)
Sodium: 130 mmol/L — ABNORMAL LOW (ref 135–145)

## 2020-08-01 LAB — MAGNESIUM: Magnesium: 2.4 mg/dL (ref 1.7–2.4)

## 2020-08-01 LAB — GLUCOSE, CAPILLARY
Glucose-Capillary: 98 mg/dL (ref 70–99)
Glucose-Capillary: 118 mg/dL — ABNORMAL HIGH (ref 70–99)
Glucose-Capillary: 97 mg/dL (ref 70–99)
Glucose-Capillary: 84 mg/dL (ref 70–99)
Glucose-Capillary: 114 mg/dL — ABNORMAL HIGH (ref 70–99)
Glucose-Capillary: 151 mg/dL — ABNORMAL HIGH (ref 70–99)

## 2020-08-01 LAB — APTT: aPTT: 31 seconds (ref 24–36)

## 2020-08-01 MED ORDER — SODIUM CHLORIDE 0.9% IV SOLUTION: Freq: Once | INTRAVENOUS | Status: DC

## 2020-08-01 MED ORDER — MIDAZOLAM HCL 2 MG/2ML IJ SOLN
INTRAMUSCULAR | Status: AC
  Filled 2020-08-01: qty 2

## 2020-08-01 MED ORDER — MIDAZOLAM HCL 2 MG/2ML IJ SOLN
INTRAMUSCULAR | Status: AC | PRN
  Administered 2020-08-01 (×2): 0.5 mg via INTRAVENOUS

## 2020-08-01 MED ORDER — CEFAZOLIN SODIUM-DEXTROSE 2-4 GM/100ML-% IV SOLN
INTRAVENOUS | Status: AC
  Filled 2020-08-01: qty 100

## 2020-08-01 MED ORDER — FENTANYL CITRATE (PF) 100 MCG/2ML IJ SOLN
INTRAMUSCULAR | Status: AC
  Filled 2020-08-01: qty 2

## 2020-08-01 MED ORDER — SODIUM CHLORIDE 0.9% IV SOLUTION: Freq: Once | INTRAVENOUS | Status: AC

## 2020-08-01 MED ORDER — DARBEPOETIN ALFA 200 MCG/0.4ML IJ SOSY
200.0000 ug | PREFILLED_SYRINGE | INTRAMUSCULAR | Status: DC
  Filled 2020-08-01 (×2): qty 0.4

## 2020-08-01 MED ORDER — FENTANYL CITRATE (PF) 100 MCG/2ML IJ SOLN
INTRAMUSCULAR | Status: AC | PRN
  Administered 2020-08-01 (×2): 25 ug via INTRAVENOUS

## 2020-08-01 NOTE — Progress Notes (Signed)
Admit: 07/18/2020 LOS: 25  52M AV IE and perivalvular abscess, serrata sp.  For AVR repair this week;  Anuric AKI from ATN-  Normal crt in Feb 2022 then 5 on 07/18/20 in the setting of IE  Current CRRT Prescription: Start Date: Restarted 5/6, first RRT4/28 Catheter: R IJ Temp HC Cath placed 07/28/20 by CCM BFR: 200-250 Pre Blood Pump: 1000 4K DFR: 1500 2K Replacement Rate: 300 4K Goal UF: 54m/h net neg Anticoagulation: Fixed dose heparin 500 units an hour in the CRRT circuit Clotting: Improved  S:   Vital signs are stable, patient is more awake, alert, and appropriate-  Basically ran even overnight-  No UOP   taken off of CRRT for a couple of hours yesterday but now back on.... valve surgery scheduled for tomorrow -   [facility]     O: 05/09 0701 - 05/10 0700 In: 1311.7 [P.O.:1080; I.V.:31.7; IV Piggyback:200] Out: 18101  Filed Weights   07/30/20 0416 07/31/20 0437 08/01/20 0323  Weight: 115.2 kg 100.2 kg 102.6 kg    Recent Labs  Lab 07/31/20 0156 07/31/20 0342 07/31/20 1109 07/31/20 1432 07/31/20 2216 08/01/20 0155 08/01/20 0547  NA 127*   < > 129* 129*  --   --  131*  K 3.4*   < > 3.3*  3.4* 4.0 3.7 3.9 3.6  3.6  CL 97*  --  97* 99  --   --  99  CO2 22  --  24 20*  --   --  24  GLUCOSE 93  --  130* 115*  --   --  99  BUN 42*  --  41* 42*  --   --  35*  CREATININE 2.61*  --  3.16* 3.02*  --   --  3.08*  CALCIUM 7.9*  --  7.8* 7.7*  --   --  7.9*  PHOS 3.7  --   --  4.0  --   --  3.1   < > = values in this interval not displayed.   Recent Labs  Lab 07/28/20 0727 07/28/20 0815 07/29/20 0449 07/31/20 0156 07/31/20 0342 07/31/20 1109  WBC 23.4*  --  30.0* 10.7*  --   --   HGB 8.7*   < > 7.4* 6.4* 5.8* 6.4*  HCT 26.5*   < > 23.4* 20.2* 17.0* 19.6*  MCV 81.5  --  87.0 87.1  --   --   PLT 224  --  291 217  --   --    < > = values in this interval not displayed.    Scheduled Meds: . chlorhexidine gluconate (MEDLINE KIT)   15 mL Mouth Rinse BID  . Chlorhexidine Gluconate Cloth  6 each Topical Daily  . clonazePAM  0.5 mg Oral BID  . docusate sodium  100 mg Oral BID  . feeding supplement (NEPRO CARB STEADY)  237 mL Oral BID BM  . fenofibrate  160 mg Oral Daily  . heparin injection (subcutaneous)  5,000 Units Subcutaneous Q8H  . melatonin  3 mg Oral QHS  . multivitamin  1 tablet Oral QHS  . pantoprazole  40 mg Oral Daily  . sodium chloride flush  10-40 mL Intracatheter Q12H   Continuous Infusions: .  prismasol BGK 4/2.5 500 mL/hr at 07/31/20 2305  .  prismasol BGK 4/2.5 300 mL/hr at 08/01/20 0544  . sodium chloride Stopped (07/31/20 0702)  .  ceFAZolin (ANCEF) IV    .  ceFEPime (MAXIPIME) IV Stopped (07/31/20 2140)  . heparin 10,000 units/ 20 mL infusion syringe 500 Units/hr (08/01/20 0600)  . prismasol BGK 4/2.5 1,500 mL/hr at 08/01/20 0544   PRN Meds:.acetaminophen (TYLENOL) oral liquid 160 mg/5 mL, acetaminophen, haloperidol lactate, heparin, heparin, QUEtiapine, sodium chloride, sodium chloride flush, temazepam  ABG    Component Value Date/Time   PHART 7.494 (H) 07/31/2020 0342   PCO2ART 33.0 07/31/2020 0342   PO2ART 74 (L) 07/31/2020 0342   HCO3 25.5 07/31/2020 0342   TCO2 26 07/31/2020 0342   ACIDBASEDEF 7.3 (H) 07/20/2020 1530   O2SAT 96.0 07/31/2020 0342    A/P  1. 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  oligoanuric right now which is not a good sign.  Planning on keeping on CRRT thru the perioperative period given issues with hyperkalemia and anticipated hemodynamic changes- then hopeful to transition to IHD very soon after  2. Hyperkalemia 07/28/2020, severe, improved- now need to change back to 4 k bath.  I see issue with intermittent hyperk-  Will do 4 k today ,still should be ok for prep for surgery on 5/11 3. 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  WBC down 4. VDRF,  resolved, normal intermittently on BiPAP 5. Anemia, transfusion per CCM-  Needs ESA as well - have added  6. Shock, resolved    Louis Meckel  [employer]

## 2020-08-01 NOTE — Progress Notes (Signed)
OT Cancellation Note  Patient Details Name: Alex [name] MRN: [id number] DOB: 1980/12/24   Cancelled Treatment:    Reason Eval/Treat Not Completed: Patient at procedure or test/ unavailable. To IR. OT will continue efforts.  Gloris Manchester OTR/L Supplemental OT, Department of rehab services [phone]  Arul Farabee R H. 08/01/2020, 2:00 PM

## 2020-08-01 NOTE — Progress Notes (Signed)
NAME:  Alex [name], MRN:  [id number], DOB:  [birth year], LOS: 62 ADMISSION DATE:  07/18/2020, CONSULTATION DATE:  4/26 REFERRING MD:  Dr. Florina Ou, CHIEF COMPLAINT:  AMS   History of Present Illness:  40 yo male smoker brought to [facility] ER with altered mental status.  Noted to have headache and feeling feverish before admission.  Found to have hypotension, AKI, thrombocytopenia, splenomegaly, lactic acidosis, elevated bilirubin, hypoglycemia.  Started on antibiotics and pressors for possible sepsis, but no clear source of infection.  Normal renal function from February 2022.  Uses suboxone for history of substance abuse.  UDS positive for amphetamines.  Transfer to [facility] for further therapy.  Hx from chart, medical team, and pt's mother.  Pertinent  Medical History  Polysubstance Abuse - amphetamines, heroin. On Suboxone.   Ruptured Disc  Significant Hospital Events: Including procedures, antibiotic start and stop dates in addition to other pertinent events   . 4/26 transfer from [facility] to [facility] . CT head 4/26 >> no acute findings . CT abd/pelvis 4/26 >> cholelithiasis w/o inflammatory chagnes, mild hepatomegaly (25 cm), moderate splenomegaly (16.7 cm), 2 mm non obstructing kidney stone on Lt . 4/27 CVL placed. Pressors escalating.  . 4/28 intubated for TEE, CVC, HD, and A-line placed. CRRT started  . Remains on CRRT . MRI on 4/30-multiple infarcts . 5/4: repeat TEE shows an aortic valve abscess . 5/5: extubated , self-removed HD cath so CRRT holiday> developed severe hyperkalemia overnight. . 5/6: new temp HD cath placed  . 5/8: new temp HD cath placed after prior one self-removed by patient . 5/9: Orthopantogram neg for abscess  Interim History / Subjective:  Remained on CRRT. Reports back pain this morning and requesting to sit. RN will need 2 to 3 person assist to ensure HD line is not dislodged  Received 1 U PRBC. Denies hemoptysis, hematochezia, melena  Objective   Blood pressure  (!) 169/45, pulse 91, temperature (!) 97.3 F (36.3 C), temperature source Oral, resp. rate (!) 23, height '6\' 4"'$  (1.93 m), weight 102.6 kg, SpO2 100 %.        Intake/Output Summary (Last 24 hours) at 08/01/2020 0740 Last data filed at 08/01/2020 0700 Gross per 24 hour  Intake 1311.71 ml  Output 1499 ml  Net -187.29 ml   Filed Weights   07/30/20 0416 07/31/20 0437 08/01/20 0323  Weight: 115.2 kg 100.2 kg 102.6 kg    Physical Exam: General: Well-appearing, no acute distress HENT: [place], AT, OP clear, MMM Eyes: EOMI, no scleral icterus Respiratory: Clear to auscultation bilaterally.  No crackles, wheezing or rales Cardiovascular: Diastolic murmur, RR, no JVD Extremities:-Edema,-tenderness Neuro: AAO x4, CNII-XII grossly intact Skin: Intact, no rashes or bruising Psych: Normal mood, normal affect   Labs/imaging that I havepersonally reviewed    5/9 Hg 6.4. Awaiting post-transfusion CBC  Imaging, labs and test noted above have been reviewed independently by me.  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  -Continue cefepime per ID -CTS planning for OR 5/11 -Appreciate ID and CTS input  Acute hypoxic respiratory failure Extubated on 5/5, tolerated well.  -Titrating off supplemental oxygen, goal SpO2 >90% -Pulmonary hygiene  Acute kidney injury Hyperkalemia Anuria Likely ischemic ATN in the setting of septic shock and septic emboli.  Requiring CRRT since 4/28, however traumatically pulled out HD  line on 5/5 and 5/8 -Bladder scans Qshift to monitor for urine production -Continue CRRT peri-op per Nephro. Once stable will transition to iHD -IR for perm cath today  Acute anemia Thrombocytopenia resolved -transfuse for Hb <7 or hemodynamically  significant bleeding -Transfuse PRBC x 1U yesterday. F/u post transfusion CBC  Polysubstance abuse -Suboxone, amphetamines -Cessation education has been provided  Agitation, impulsiveness -OOB to chair today  Best practice   Diet:  Oral  Pain/Anxiety/Delirium protocol (if indicated): No VAP protocol (if indicated): Not indicated DVT prophylaxis: Subcutaneous Heparin and SCD GI prophylaxis: PPI Glucose control:  SSI No Central venous access:  N/A Arterial line:  N/A Foley:  N/A Mobility:  bed rest  PT consulted: N/A Last date of multidisciplinary goals of care discussion: mother updated at bedside 5/7 Code Status:  full code Disposition: ICU for CRRT  The patient is critically ill with renal failure and requires high complexity decision making for assessment and support, frequent evaluation and titration of therapies, application of advanced monitoring technologies and extensive interpretation of multiple databases.  Independent Critical Care Time: 35 Minutes.   Rodman Pickle, M.D. [facility] Pulmonary/Critical Care Medicine 08/01/2020 7:59 AM   Please see Amion for pager number to reach on-call Pulmonary and Critical Care Team.

## 2020-08-01 NOTE — Procedures (Signed)
Pre-procedure Diagnosis: ESRD Post-procedure Diagnosis: Same  Successful placement of tunneled HD catheter with tips terminating within the superior aspect of the right atrium.    Complications: None Immediate  EBL: Minimal   The catheter is ready for immediate use.   Jay Aislee Landgren, MD Pager #: [phone]   

## 2020-08-01 NOTE — Progress Notes (Signed)
SLP Cancellation Note  Patient Details Name: Alex [name] MRN: [id number] DOB: [birth year]   Cancelled treatment:       Reason Eval/Treat Not Completed: Patient at procedure or test/unavailable /In IR for tunneled HD cath.  Will continue efforts.  Katleen Carraway L. Tivis Ringer, [place] CCC/SLP Acute Rehabilitation Services Office number [phone] Pager [phone]   [name] 08/01/2020, 3:20 PM

## 2020-08-01 NOTE — Progress Notes (Signed)
Called for pt to come to IR for hd catheter placement. Pt is eating breakfast at this time. Advised RN that pt needs to be NPO for procedure and we will attempt to bring him down this afternoon if IR schedule allows. Orders placed.

## 2020-08-01 NOTE — Progress Notes (Addendum)
Pharmacy Antibiotic Note  Alex [name] is a 40 y.o. male admitted on 07/18/2020 and found to have Serratia bacteremia with TEE showing endocarditis. Pharmacy has been consulted for Cefepime dosing.  CRRT was re-initiated 5/6.  Continue Cefepime 2g IV q12 hours for CRRT dosing.   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 ID planning for antibiotics stop date 6 weeks from valve repair (tentatively scheduled for 08/02/20)  WBC 5/9 10.7 (CBC results pending this AM), patient remains afebrile.   Plan: - Cefepime 2g IV q12 hours - Follow-up plans for intermittent HD and Cefepime dose adjustments - Follow-up TCTS plans  - Monitor clinical improvement, culture results, ability to de-escalate antibiotics  Height: '6\' 4"'$  (193 cm) Weight: 102.6 kg (226 lb 3.1 oz) IBW/kg (Calculated) : 86.8  Temp (24hrs), Avg:98.2 F (36.8 C), Min:97.3 F (36.3 C), Max:99.3 F (37.4 C)  Recent Labs  Lab 07/26/20 0331 07/26/20 1712 07/27/20 0413 07/27/20 1636 07/28/20 0727 07/28/20 0815 07/29/20 0449 07/29/20 1645 07/30/20 1800 07/31/20 0156 07/31/20 1109 07/31/20 1432 08/01/20 0547  WBC 17.1*  --  18.1*  --  23.4*  --  30.0*  --   --  10.7*  --   --   --   CREATININE 2.68*   < > 2.16*   < > 5.68*   < >  --    < > 2.82* 2.61* 3.16* 3.02* 3.08*   < > = values in this interval not displayed.    Estimated Creatinine Clearance: 40.3 mL/min (A) (by C-G formula based on SCr of 3.08 mg/dL (H)).    No Known Allergies  Antimicrobials this admission: Vancomycin 4/26 x 1 Cefepime 4/26 >>  Dose adjustments this admission: 5/5: Cefepime adjusted to 1g IV q24 hours starting 5/6 (received 2g on 5/5) 5/6: Cefepime adjusted to 2g IV q12 hours - CRRT restarted  Microbiology results: 4/26 Flu/COVID >> neg 4/26 BCx >> Serratia 4/28 BCx >> ngtd  Thank you for allowing pharmacy to be a part of this patient's care.  Dimple Nanas, PharmD PGY-1 Acute  Care Pharmacy Resident Office: [phone] 08/01/2020 7:41 AM   **Pharmacist phone directory can now be found on [email or web address] ([email or web address]).  Listed under [place].

## 2020-08-02 ENCOUNTER — Other Ambulatory Visit (HOSPITAL_COMMUNITY): Payer: 59

## 2020-08-02 LAB — BPAM RBC
Blood Product Expiration Date: 202205112359
Blood Product Expiration Date: 202205142359
Blood Product Expiration Date: 202205142359
Blood Product Expiration Date: 202206042359
Blood Product Expiration Date: 202206052359
Blood Product Expiration Date: 202206062359
Blood Product Expiration Date: 202206102359
ISSUE DATE / TIME: 202205090426
ISSUE DATE / TIME: 202205100958
ISSUE DATE / TIME: 202205101821
Unit Type and Rh: 9500
Unit Type and Rh: 9500
Unit Type and Rh: 9500
Unit Type and Rh: 9500
Unit Type and Rh: 9500
Unit Type and Rh: 9500
Unit Type and Rh: 9500

## 2020-08-02 LAB — RENAL FUNCTION PANEL
Albumin: 1.8 g/dL — ABNORMAL LOW (ref 3.5–5.0)
Albumin: 1.6 g/dL — ABNORMAL LOW (ref 3.5–5.0)
Anion gap: 10 (ref 5–15)
Anion gap: 9 (ref 5–15)
BUN: 34 mg/dL — ABNORMAL HIGH (ref 6–20)
BUN: 43 mg/dL — ABNORMAL HIGH (ref 6–20)
CO2: 22 mmol/L (ref 22–32)
CO2: 22 mmol/L (ref 22–32)
Calcium: 7.9 mg/dL — ABNORMAL LOW (ref 8.9–10.3)
Calcium: 7.8 mg/dL — ABNORMAL LOW (ref 8.9–10.3)
Chloride: 99 mmol/L (ref 98–111)
Chloride: 99 mmol/L (ref 98–111)
Creatinine, Ser: 3.19 mg/dL — ABNORMAL HIGH (ref 0.61–1.24)
Creatinine, Ser: 4.03 mg/dL — ABNORMAL HIGH (ref 0.61–1.24)
GFR, Estimated: 25 mL/min — ABNORMAL LOW (ref >60)
GFR, Estimated: 19 mL/min — ABNORMAL LOW (ref >60)
Glucose, Bld: 151 mg/dL — ABNORMAL HIGH (ref 70–99)
Glucose, Bld: 119 mg/dL — ABNORMAL HIGH (ref 70–99)
Phosphorus: 2.8 mg/dL (ref 2.5–4.6)
Phosphorus: 3.9 mg/dL (ref 2.5–4.6)
Potassium: 3.8 mmol/L (ref 3.5–5.1)
Potassium: 4.4 mmol/L (ref 3.5–5.1)
Sodium: 131 mmol/L — ABNORMAL LOW (ref 135–145)
Sodium: 130 mmol/L — ABNORMAL LOW (ref 135–145)

## 2020-08-02 LAB — CBC
HCT: 25.3 % — ABNORMAL LOW (ref 39.0–52.0)
Hemoglobin: 8.1 g/dL — ABNORMAL LOW (ref 13.0–17.0)
MCH: 28.1 pg (ref 26.0–34.0)
MCHC: 32 g/dL (ref 30.0–36.0)
MCV: 87.8 fL (ref 80.0–100.0)
Platelets: 251 10*3/uL (ref 150–400)
RBC: 2.88 MIL/uL — ABNORMAL LOW (ref 4.22–5.81)
RDW: 17.5 % — ABNORMAL HIGH (ref 11.5–15.5)
WBC: 8.8 10*3/uL (ref 4.0–10.5)
nRBC: 0 % (ref 0.0–0.2)

## 2020-08-02 LAB — TYPE AND SCREEN
ABO/RH(D): O NEG
Antibody Screen: NEGATIVE
Unit division: 0
Unit division: 0
Unit division: 0
Unit division: 0
Unit division: 0
Unit division: 0
Unit division: 0

## 2020-08-02 LAB — APTT: aPTT: 30 seconds (ref 24–36)

## 2020-08-02 LAB — GLUCOSE, CAPILLARY
Glucose-Capillary: 103 mg/dL — ABNORMAL HIGH (ref 70–99)
Glucose-Capillary: 97 mg/dL (ref 70–99)
Glucose-Capillary: 99 mg/dL (ref 70–99)
Glucose-Capillary: 135 mg/dL — ABNORMAL HIGH (ref 70–99)

## 2020-08-02 LAB — MAGNESIUM: Magnesium: 2.6 mg/dL — ABNORMAL HIGH (ref 1.7–2.4)

## 2020-08-02 MED ORDER — CEFAZOLIN SODIUM-DEXTROSE 2-4 GM/100ML-% IV SOLN
2.0000 g | INTRAVENOUS | Status: DC
  Filled 2020-08-02: qty 100

## 2020-08-02 MED ORDER — VANCOMYCIN HCL 1500 MG/300ML IV SOLN
1500.0000 mg | INTRAVENOUS | Status: DC
  Filled 2020-08-02: qty 300

## 2020-08-02 MED ORDER — NOREPINEPHRINE 4 MG/250ML-% IV SOLN
0.0000 ug/min | INTRAVENOUS | Status: DC
Start: 2020-08-03 — End: 2020-08-03
  Filled 2020-08-02: qty 250

## 2020-08-02 MED ORDER — SODIUM CHLORIDE 0.9 % IV SOLN
INTRAVENOUS | Status: DC
  Filled 2020-08-02: qty 30

## 2020-08-02 MED ORDER — CHLORHEXIDINE GLUCONATE CLOTH 2 % EX PADS
6.0000 | MEDICATED_PAD | Freq: Once | CUTANEOUS | Status: AC
  Administered 2020-08-03: 6 via TOPICAL

## 2020-08-02 MED ORDER — TRANEXAMIC ACID (OHS) PUMP PRIME SOLUTION
2.0000 mg/kg | INTRAVENOUS | Status: DC
  Filled 2020-08-02: qty 2.3

## 2020-08-02 MED ORDER — POTASSIUM CHLORIDE 2 MEQ/ML IV SOLN
80.0000 meq | INTRAVENOUS | Status: DC
  Filled 2020-08-02: qty 40

## 2020-08-02 MED ORDER — DEXMEDETOMIDINE HCL IN NACL 400 MCG/100ML IV SOLN
0.1000 ug/kg/h | INTRAVENOUS | Status: DC
  Filled 2020-08-02: qty 100

## 2020-08-02 MED ORDER — MILRINONE LACTATE IN DEXTROSE 20-5 MG/100ML-% IV SOLN
0.3000 ug/kg/min | INTRAVENOUS | Status: DC
  Filled 2020-08-02: qty 100

## 2020-08-02 MED ORDER — METOPROLOL TARTRATE 12.5 MG HALF TABLET: 12.5000 mg | ORAL_TABLET | Freq: Once | ORAL | Status: DC

## 2020-08-02 MED ORDER — B COMPLEX-C PO TABS
1.0000 | ORAL_TABLET | Freq: Every day | ORAL | Status: DC
  Administered 2020-08-02: 1 via ORAL
  Filled 2020-08-02: qty 1

## 2020-08-02 MED ORDER — PROSOURCE PLUS PO LIQD
30.0000 mL | Freq: Two times a day (BID) | ORAL | Status: DC
  Administered 2020-08-04 – 2020-08-07 (×6): 30 mL via ORAL
  Filled 2020-08-02 (×6): qty 30

## 2020-08-02 MED ORDER — HALOPERIDOL LACTATE 5 MG/ML IJ SOLN
2.0000 mg | Freq: Four times a day (QID) | INTRAMUSCULAR | Status: DC | PRN
  Administered 2020-08-02 – 2020-08-03 (×2): 5 mg via INTRAVENOUS
  Filled 2020-08-02: qty 1

## 2020-08-02 MED ORDER — CHLORHEXIDINE GLUCONATE CLOTH 2 % EX PADS
6.0000 | MEDICATED_PAD | Freq: Once | CUTANEOUS | Status: AC
  Administered 2020-08-02: 6 via TOPICAL

## 2020-08-02 MED ORDER — NITROGLYCERIN IN D5W 200-5 MCG/ML-% IV SOLN
2.0000 ug/min | INTRAVENOUS | Status: DC
  Filled 2020-08-02: qty 250

## 2020-08-02 MED ORDER — PHENYLEPHRINE HCL-NACL 20-0.9 MG/250ML-% IV SOLN
30.0000 ug/min | INTRAVENOUS | Status: DC
  Filled 2020-08-02: qty 250

## 2020-08-02 MED ORDER — MANNITOL 20 % IV SOLN
Freq: Once | INTRAVENOUS | Status: DC
  Filled 2020-08-02: qty 13

## 2020-08-02 MED ORDER — TRANEXAMIC ACID (OHS) BOLUS VIA INFUSION
15.0000 mg/kg | INTRAVENOUS | Status: DC
  Filled 2020-08-02: qty 1470

## 2020-08-02 MED ORDER — EPINEPHRINE HCL 5 MG/250ML IV SOLN IN NS
0.0000 ug/min | INTRAVENOUS | Status: DC
Start: 2020-08-03 — End: 2020-08-03
  Filled 2020-08-02: qty 250

## 2020-08-02 MED ORDER — INSULIN REGULAR(HUMAN) IN NACL 100-0.9 UT/100ML-% IV SOLN
INTRAVENOUS | Status: DC
  Filled 2020-08-02: qty 100

## 2020-08-02 MED ORDER — BISACODYL 5 MG PO TBEC: 5.0000 mg | DELAYED_RELEASE_TABLET | Freq: Once | ORAL | Status: DC

## 2020-08-02 MED ORDER — SODIUM CHLORIDE 0.9 % IV SOLN
2.0000 g | Freq: Once | INTRAVENOUS | Status: AC
  Administered 2020-08-03: 2 g via INTRAVENOUS
  Filled 2020-08-02: qty 2

## 2020-08-02 MED ORDER — HALOPERIDOL LACTATE 5 MG/ML IJ SOLN: 5.0000 mg | Freq: Once | INTRAMUSCULAR | Status: DC

## 2020-08-02 MED ORDER — TRANEXAMIC ACID 1000 MG/10ML IV SOLN
1.5000 mg/kg/h | INTRAVENOUS | Status: DC
  Filled 2020-08-02: qty 25

## 2020-08-02 MED ORDER — SODIUM CHLORIDE 0.9 % IV SOLN
2.0000 g | Freq: Two times a day (BID) | INTRAVENOUS | Status: DC
  Administered 2020-08-04 (×2): 2 g via INTRAVENOUS
  Filled 2020-08-02 (×2): qty 2

## 2020-08-02 MED ORDER — CHLORHEXIDINE GLUCONATE 0.12 % MT SOLN
15.0000 mL | Freq: Once | OROMUCOSAL | Status: AC
  Administered 2020-08-03: 15 mL via OROMUCOSAL
  Filled 2020-08-02: qty 15

## 2020-08-02 MED ORDER — TEMAZEPAM 15 MG PO CAPS: 15.0000 mg | ORAL_CAPSULE | Freq: Once | ORAL | Status: DC | PRN

## 2020-08-02 MED ORDER — PLASMA-LYTE 148 IV SOLN
INTRAVENOUS | Status: DC
  Filled 2020-08-02: qty 2.5

## 2020-08-02 MED ORDER — DARBEPOETIN ALFA 200 MCG/0.4ML IJ SOSY
200.0000 ug | PREFILLED_SYRINGE | INTRAMUSCULAR | Status: DC
  Administered 2020-08-02: 200 ug via SUBCUTANEOUS
  Filled 2020-08-02: qty 0.4

## 2020-08-02 NOTE — Progress Notes (Signed)
Occupational Therapy Treatment Patient Details Name: Alex [name] MRN: [id number] DOB: [birth year] Today's Date: 08/02/2020    History of present illness 40 yo male smoker brought to [facility] ER 07/18/20 with altered mental status. Found to have hypotension, AKI, thrombocytopenia, splenomegaly, lactic acidosis, elevated bilirubin, hypoglycemia, polysubstance abuse. Treated for sepsis Intubated 4/28-5/5. 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 5/10 to IR for tunneled HD cath. Plan for OR on 5/12 for valve repair.   OT comments  Patient progressing toward OT goals. Patient off CRRT temporarily. Session with focus on self-care tasks standing at sink level and functional transfers with use of RW. Patient continues to be limited by decreased safety awareness requiring cues for pacing and safety with RW, generalized weakness, and need for Min guard to Min A grossly for ADLs. Patient likely to continue to progress. D/c disposition updated.    Follow Up Recommendations  Supervision/Assistance - 24 hour;No OT follow up    Equipment Recommendations  None recommended by OT    Recommendations for Other Services      Precautions / Restrictions Precautions Precautions: Fall Precaution Comments: impulsivity; pt is 6'4" Restrictions Weight Bearing Restrictions: No       Mobility Bed Mobility Overal bed mobility: Needs Assistance Bed Mobility: Rolling;Sit to Supine;Sidelying to Sit Rolling: Supervision Sidelying to sit: Supervision   Sit to supine: Supervision   General bed mobility comments: Supervision A for safety. Patient temporarily off CRRT.    Transfers Overall transfer level: Needs assistance Equipment used: None Transfers: Sit to/from Stand Sit to  Stand: Min assist         General transfer comment: Sit to stand from EOB x several trials with Min A and cues for hand placement, pacing and safety.    Balance Overall balance assessment: Needs assistance Sitting-balance support: Feet supported Sitting balance-Leahy Scale: Fair     Standing balance support: No upper extremity supported Standing balance-Leahy Scale: Fair Standing balance comment: Reliant on UE support during dynamic activities. Multiple lateral LOB with mobility ~32f.                           ADL either performed or assessed with clinical judgement   ADL Overall ADL's : Needs assistance/impaired     Grooming: Wash/dry face;Oral care;Minimal assistance;Standing Grooming Details (indicate cue type and reason): Min A for steadying/balance and safety.             Lower Body Dressing: Minimal assistance;Sit to/from stand Lower Body Dressing Details (indicate cue type and reason): Patient able to doff/don footwear seated EOB with supervision A for safety. Would likely require Min A to don LB clothing in sitting/standing given balance deficits and safety concerns. Toilet Transfer: Minimal aInsurance claims handlerDetails (indicate cue type and reason): Simulated with transfer to EOB with Min A for steadying/safety and cues for walker management and pacing.         Functional mobility during ADLs: Minimal assistance;Rolling walker General ADL Comments: Patient continues to be limited by decreased safety awareness, impulsivity (improving) and generalized weakness requiring Min A at most for ADLs.     Vision       Perception     Praxis      Cognition Arousal/Alertness: Awake/alert Behavior During Therapy: Impulsive;Restless Overall Cognitive Status: Impaired/Different from baseline Area of  Impairment: Attention;Memory;Safety/judgement;Awareness;Problem solving                 Orientation Level:  (NT) Current Attention Level:  Sustained Memory: Decreased short-term memory;Decreased recall of precautions Following Commands: Follows one step commands consistently Safety/Judgement: Decreased awareness of safety;Decreased awareness of deficits Awareness: Intellectual Problem Solving: Difficulty sequencing;Requires verbal cues;Slow processing General Comments: Temporarily off CRRT; requires cues for safety/pacing.        Exercises Exercises: General Lower Extremity;Other exercises General Exercises - Lower Extremity Ankle Circles/Pumps: AROM;Both;10 reps Long Arc Quad: AROM;Both;10 reps Hip ABduction/ADduction: AROM;Both;5 reps;Standing (standing step-taps laterally) Mini-Sqauts: AROM;10 reps (x 2 sets)   Shoulder Instructions       General Comments Mother present throughout session.    Pertinent Vitals/ Pain       Pain Assessment: No/denies pain Faces Pain Scale: No hurt  Home Living                                          Prior Functioning/Environment              Frequency  Min 2X/week        Progress Toward Goals  OT Goals(current goals can now be found in the care plan section)  Progress towards OT goals: Progressing toward goals  Acute Rehab OT Goals Patient Stated Goal: To get stronger. OT Goal Formulation: With patient/family Time For Goal Achievement: 08/10/20 Potential to Achieve Goals: Good ADL Goals Pt Will Perform Eating: with supervision Pt Will Perform Grooming: with supervision;standing Pt Will Perform Upper Body Dressing: with supervision;sitting Pt Will Perform Lower Body Dressing: with supervision;sit to/from stand Pt Will Transfer to Toilet: with supervision;ambulating;regular height toilet Pt Will Perform Toileting - Clothing Manipulation and hygiene: with supervision;sit to/from stand Additional ADL Goal #1: Pt will follow one step commands consistently. Additional ADL Goal #2: Pt will perform bed mobility with supervision in preparation for  ADL.  Plan Discharge plan needs to be updated;Frequency remains appropriate    Co-evaluation                 AM-PAC OT "6 Clicks" Daily Activity     Outcome Measure   Help from another person eating meals?: A Little Help from another person taking care of personal grooming?: A Little Help from another person toileting, which includes using toliet, bedpan, or urinal?: A Little Help from another person bathing (including washing, rinsing, drying)?: A Little Help from another person to put on and taking off regular upper body clothing?: A Little Help from another person to put on and taking off regular lower body clothing?: A Little 6 Click Score: 18    End of Session Equipment Utilized During Treatment: Gait belt  OT Visit Diagnosis: Unsteadiness on feet (R26.81);Other abnormalities of gait and mobility (R26.89);Muscle weakness (generalized) (M62.81)   Activity Tolerance Patient tolerated treatment well   Patient Left in bed;with call bell/phone within reach;with bed alarm set;with family/visitor present   Nurse Communication Mobility status        Time: [id number] OT Time Calculation (min): 18 min  Charges: OT General Charges $OT Visit: 1 Visit OT Treatments $Self Care/Home Management : 8-22 mins  Maddex Garlitz H. OTR/L Supplemental OT, Department of rehab services [phone]   Deshunda Thackston R H. 08/02/2020, 12:36 PM

## 2020-08-02 NOTE — Progress Notes (Signed)
Admit: 07/18/2020 LOS: 36  35M AV IE and perivalvular abscess, serrata sp.  For AVR repair this week;  Anuric AKI from ATN-  Normal crt in Feb 2022 then 5 on 07/18/20 in the setting of IE  Current CRRT Prescription: Start Date: Restarted 5/6, first RRT4/28 Catheter: R IJ Temp HC Cath placed 07/28/20 by CCM BFR: 200-250 Pre Blood Pump: 1000 4K DFR: 1500 4K Replacement Rate: 300 4K Goal UF: 72m/h net neg Anticoagulation: Fixed dose heparin 500 units an hour in the CRRT circuit Clotting: Improved  S:   Vital signs are stable-  Negative 870 on CRRT.  No UOP   s/p TDC placement and also given 2 units of blood yesterday Surgery postponed again until tomorrow     O: 05/10 0701 - 05/11 0700 In: 1822.1 [P.O.:470; I.V.:265; BRFFMB:846 IV Piggyback:300.1] Out: 2696    gen-  NAD-  Sitting up in bed Lungs-  Clear cv-  RRR abd-  Soft, non tender Ext-  No edema   Filed Weights   07/31/20 0437 08/01/20 0323 08/02/20 0349  Weight: 100.2 kg 102.6 kg 114.9 kg    Recent Labs  Lab 08/01/20 0547 08/01/20 0751 08/01/20 1606 08/02/20 0210  NA 131*  --  130* 131*  K 3.6  3.6 3.7 3.7 3.8  CL 99  --  100 99  CO2 24  --  25 22  GLUCOSE 99  --  97 151*  BUN 35*  --  35* 34*  CREATININE 3.08*  --  3.26* 3.19*  CALCIUM 7.9*  --  7.7* 7.9*  PHOS 3.1  --  3.8 2.8   Recent Labs  Lab 08/01/20 0751 08/01/20 1606 08/02/20 0210  WBC 9.5 9.4 8.8  HGB 6.0* 6.5* 8.1*  HCT 18.8* 20.2* 25.3*  MCV 88.7 87.4 87.8  PLT 257 259 251    Scheduled Meds: . sodium chloride   Intravenous Once  . chlorhexidine gluconate (MEDLINE KIT)  15 mL Mouth Rinse BID  . Chlorhexidine Gluconate Cloth  6 each Topical Daily  . clonazePAM  0.5 mg Oral BID  . darbepoetin (ARANESP) injection - NON-DIALYSIS  200 mcg Subcutaneous Q Wed-1800  . docusate sodium  100 mg Oral BID  . feeding supplement (NEPRO CARB STEADY)  237 mL Oral BID BM  . fenofibrate  160 mg Oral Daily  . heparin injection (subcutaneous)  5,000  Units Subcutaneous Q8H  . melatonin  3 mg Oral QHS  . multivitamin  1 tablet Oral QHS  . pantoprazole  40 mg Oral Daily  . sodium chloride flush  10-40 mL Intracatheter Q12H   Continuous Infusions: .  prismasol BGK 4/2.5 500 mL/hr at 08/01/20 2317  .  prismasol BGK 4/2.5 300 mL/hr at 08/02/20 0313  . sodium chloride Stopped (07/31/20 0702)  . ceFEPime (MAXIPIME) IV Stopped (08/01/20 2215)  . heparin 10,000 units/ 20 mL infusion syringe 500 Units/hr (08/02/20 0800)  . prismasol BGK 4/2.5 1,500 mL/hr at 08/02/20 0631   PRN Meds:.acetaminophen (TYLENOL) oral liquid 160 mg/5 mL, acetaminophen, haloperidol lactate, heparin, heparin, QUEtiapine, sodium chloride, sodium chloride flush, temazepam  ABG    Component Value Date/Time   PHART 7.494 (H) 07/31/2020 0342   PCO2ART 33.0 07/31/2020 0342   PO2ART 74 (L) 07/31/2020 0342   HCO3 25.5 07/31/2020 0342   TCO2 26 07/31/2020 0342   ACIDBASEDEF 7.3 (H) 07/20/2020 1530   O2SAT 96.0 07/31/2020 0342    A/P  1. Dialysis dependent anuric AKI on CRRT/RRT since 4/28-  crt was  normal in Feb 2022.  oligoanuric right now which is not a good sign.  Planning on keeping on CRRT thru the perioperative period given issues with hyperkalemia and anticipated hemodynamic changes- then hopeful to transition to IHD very soon after but still looking for signs of improvement.  Have given nurse permission today to give him a couple of hour break when filter clots  2. Hyperkalemia 07/28/2020, severe, improved-  changed back to 4 k bath.  I see issue with intermittent hyperk-  Will do 4 k today ,still should be ok for prep for surgery on 5/12 3. 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  WBC down 4. VDRF, resolved, normal intermittently on BiPAP 5. Anemia, transfusion per CCM-  Needs ESA as well - have added  6. Shock, resolved    Louis Meckel  [employer]

## 2020-08-02 NOTE — Progress Notes (Addendum)
PCCM Interval Progress Note  Called by Warren Lacy regarding pt's desire to leave AMA. Saw pt with Wandra Arthurs NP and Burt Ek MD.  Pt A&O x 3.  He is adamant to leave as he states he has been here too long.  Currently hooked up to CRRT.  Latest labs and vitals stable.  Multiple nurses as well as Wandra Arthurs, Dr. Earlie Server, myself all tried to explain risks involved with leaving and tried to convince pt to stay to complete plan as outlined by multiple specialities including [place] tomorrow morning.  Explained risk of worsening renal failure, sepsis, and ultimately death.  Pt not interested at all in what we had to say.  States his cousin is on her way to pick pt up.  Discussed with Dr. Johnney Ou of nephrology who agreed tunneled HD cath needs to be removed.  Dr. Earlie Server to remove at bedside.  Explained to pt who is in agreement to stay until catheter has been removed.  ADDENDUM:  After additional discussions with pt, he is now in agreement to stay as long as he can get all of his night time meds (Clonazepam, Seroquel, Haldol). Orders all in already.  RN aware of plan of care.   Montey Hora, [facility] For pager details, please see AMION or use Epic chat  After 1900, please call [facility] for cross coverage needs 08/02/2020, 8:02 PM

## 2020-08-02 NOTE — Progress Notes (Signed)
At bedside, RN informed this [facility] IV Nurse that pt. Is leaving AMA, to discontinue IV consult.

## 2020-08-02 NOTE — Progress Notes (Signed)
     [address]       [place] [address]             [phone]       OR tomorrow for AVR, root replacement  Tollie Canada O Avondre Richens

## 2020-08-02 NOTE — Progress Notes (Signed)
CRRT filter clotted and held until 1900- upon restarting hold Heparin per Jonnie Finner, MD.

## 2020-08-02 NOTE — Progress Notes (Signed)
  Speech Language Pathology Treatment: Cognitive-Linquistic  Patient Details Name: FURIOUS WRITER MRN: [id number] DOB: [birth year] Today's Date: 08/02/2020 Time: 1000-1015 SLP Time Calculation (min) (ACUTE ONLY): 15 min  Assessment / Plan / Recommendation Clinical Impression  Pt completed the Subtest of the ALFA titled "Counting Money" within normal limit time frame. He was able to demonstrate excellent numerical reasoning, recognition of errors, sustained and alternating attention. He needed min assist with working memory and used no compensatory strategies in this area.  Pt would benefit from ongoing interventions targeting memory strategies as he specifically reports this is the area he notices is impaired. His dysarthria has resolved. Will f/u 1x a week while admitted.    HPI HPI: 40 yo male smoker brought to [facility] ER 07/18/20 with altered mental status. Found to have hypotension, AKI, thrombocytopenia, splenomegaly, lactic acidosis, elevated bilirubin, hypoglycemia, polysubstance abuse. Treated for sepsis Intubated 4/28-5/5. CRRT initiated 4/28, discontinued when pt pulled out catheter on 5/5; TEE found endocarditis with aortic valve perforation; MRI brain showed dozens of primarily punctate acute infarctions scattered throughout the cerebellum and both cerebral hemisphere.      SLP Plan  Continue with current plan of care       Recommendations                   Follow up Recommendations: Outpatient SLP Plan: Continue with current plan of care       GO                Yahsir Wickens, Katherene Ponto 08/02/2020, 11:09 AM

## 2020-08-02 NOTE — Progress Notes (Signed)
[facility] for Infectious Disease    Date of Admission:  07/18/2020      ID: Alex [name] is a 40 y.o. 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 Anxious to have surgery. Denies headache, new rash, no fevers. Chills, nightsweats  Medications:  . (feeding supplement) PROSource Plus  30 mL Oral BID BM  . sodium chloride   Intravenous Once  . B-complex with vitamin C  1 tablet Oral Daily  . chlorhexidine gluconate (MEDLINE KIT)  15 mL Mouth Rinse BID  . Chlorhexidine Gluconate Cloth  6 each Topical Daily  . clonazePAM  0.5 mg Oral BID  . darbepoetin (ARANESP) injection - NON-DIALYSIS  200 mcg Subcutaneous Q Wed-1800  . docusate sodium  100 mg Oral BID  . [START ON 08/03/2020] epinephrine  0-10 mcg/min Intravenous To OR  . fenofibrate  160 mg Oral Daily  . heparin injection (subcutaneous)  5,000 Units Subcutaneous Q8H  . [START ON 08/03/2020] heparin-papaverine-plasmalyte irrigation   Irrigation To OR  . [START ON 08/03/2020] insulin   Intravenous To OR  . [place] Blood Cardioplegia vial (lidocaine/magnesium/mannitol 0.26g-4g-6.4g)   Intracoronary Once  . melatonin  3 mg Oral QHS  . pantoprazole  40 mg Oral Daily  . [START ON 08/03/2020] phenylephrine  30-200 mcg/min Intravenous To OR  . [START ON 08/03/2020] potassium chloride  80 mEq Other To OR  . sodium chloride flush  10-40 mL Intracatheter Q12H  . [START ON 08/03/2020] tranexamic acid  15 mg/kg (Adjusted) Intravenous To OR  . [START ON 08/03/2020] tranexamic acid  2 mg/kg Intracatheter To OR    Objective: Vital signs in last 24 hours: Temp:  [97.3 F (36.3 C)-99.4 F (37.4 C)] 98.1 F (36.7  C) (05/11 1529) Pulse Rate:  [85-108] 92 (05/11 1800) Resp:  [11-29] 19 (05/11 1800) BP: (79-179)/(30-54) 111/45 (05/11 1800) SpO2:  [99 %-100 %] 100 % (05/11 1800) Weight:  [114.9 kg] 114.9 kg (05/11 0349) Physical Exam  Constitutional: He is oriented to person, place, and time. He appears well-developed and well-nourished. No distress.  HENT:  Mouth/Throat: Oropharynx is clear and moist. No oropharyngeal exudate.  Cardiovascular: Normal rate, regular rhythm and normal heart sounds. +systolic murmur Pulmonary/Chest: Effort normal and breath sounds normal. No respiratory distress. He has no wheezes.  Abdominal: Soft. Bowel sounds are normal. He exhibits no distension. There is no tenderness.  Lymphadenopathy:  He has no cervical adenopathy.  Neurological: He is alert and oriented to person, place, and time.  Skin: Skin is warm and dry. No rash noted. No erythema.  Psychiatric: He has a normal mood and affect. His behavior is normal.    Lab Results Recent Labs    08/01/20 1606 08/02/20 0210 08/02/20 1550  WBC 9.4 8.8  --   HGB 6.5* 8.1*  --   HCT 20.2* 25.3*  --   NA 130* 131* 130*  K 3.7 3.8 4.4  CL 100 99 99  CO2 _0 BUN 35* 34* 43*  CREATININE 3.26* 3.19* 4.03*   Liver Panel Recent Labs    07/31/20 1109 07/31/20 1432 08/02/20 0210 08/02/20 1550  PROT 7.3  --   --   --   ALBUMIN 1.7*   < > 1.8* 1.6*  AST 36  --   --   --   ALT 40  --   --   --   ALKPHOS 63  --   --   --   BILITOT 1.1  --   --   --    < > = values in this interval not displayed.    Microbiology: 5/7 blood cx ngtd Studies/Results: IR Fluoro Guide CV Line Right  Result Date: 08/01/2020 INDICATION: End-stage renal disease. In need of durable intravenous access for continuation hemodialysis. EXAM: TUNNELED CENTRAL VENOUS HEMODIALYSIS CATHETER PLACEMENT WITH ULTRASOUND AND FLUOROSCOPIC GUIDANCE MEDICATIONS: Ancef 2 gm IV . The antibiotic was given in an appropriate time interval prior to  skin puncture. ANESTHESIA/SEDATION: Moderate (conscious) sedation was employed during this procedure. A total of Versed 1 mg and Fentanyl 50 mcg was administered intravenously. Moderate Sedation Time: 14 minutes. The patient's level of consciousness and vital signs were monitored continuously by radiology nursing throughout the procedure under my direct supervision. FLUOROSCOPY TIME:  18 seconds (5 mGy) COMPLICATIONS: None immediate. PROCEDURE: Informed written consent was obtained from the patient after a discussion of the risks, benefits, and alternatives to treatment. Questions regarding the procedure were encouraged and answered. The right neck and chest were prepped with chlorhexidine in a sterile fashion, and a sterile drape was applied covering the operative field. Maximum barrier sterile technique with sterile gowns and gloves were used for the procedure. A timeout was performed prior to the initiation of the procedure. After creating a small venotomy incision, a micropuncture kit was utilized to access the internal jugular vein. Real-time ultrasound guidance was utilized for vascular access including the acquisition of a permanent ultrasound image documenting patency of the accessed vessel. The microwire was utilized to measure appropriate catheter length. A stiff Glidewire was advanced to the level of the IVC and the micropuncture sheath was exchanged for a peel-away sheath. A palindrome tunneled hemodialysis catheter measuring 19 cm from tip to cuff was tunneled in a retrograde fashion from the anterior chest wall to the venotomy incision. The catheter was then placed through the peel-away sheath with tips ultimately positioned within the superior aspect of the right atrium. Final catheter positioning was confirmed and documented with a spot radiographic image. The catheter aspirates and flushes normally. The catheter was flushed with appropriate volume heparin dwells. The catheter exit site was secured  with a 0-Prolene retention suture. The venotomy incision was closed with an interrupted 4-0 Vicryl, Dermabond and Steri-strips. Dressings were applied. The patient tolerated the procedure well without immediate post procedural complication. IMPRESSION: Successful placement of 19 cm tip to cuff tunneled hemodialysis catheter via the right internal jugular vein with tips terminating within the superior aspect of the right atrium. The catheter is ready for immediate use. Electronically Signed   By: Sandi Mariscal M.D.   On: 08/01/2020 15:17   IR US Guide Vasc Access Right  Result Date: 08/01/2020 INDICATION: End-stage renal disease. In need of durable intravenous access for continuation hemodialysis. EXAM: TUNNELED CENTRAL VENOUS HEMODIALYSIS CATHETER PLACEMENT WITH ULTRASOUND AND FLUOROSCOPIC GUIDANCE MEDICATIONS: Ancef 2 gm IV . The antibiotic was given in an appropriate time interval prior to skin puncture. ANESTHESIA/SEDATION: Moderate (conscious) sedation was employed during this procedure. A total of Versed 1 mg and Fentanyl 50 mcg was administered intravenously. Moderate Sedation Time: 14 minutes. The patient's level of consciousness and vital  signs were monitored continuously by radiology nursing throughout the procedure under my direct supervision. FLUOROSCOPY TIME:  18 seconds (5 mGy) COMPLICATIONS: None immediate. PROCEDURE: Informed written consent was obtained from the patient after a discussion of the risks, benefits, and alternatives to treatment. Questions regarding the procedure were encouraged and answered. The right neck and chest were prepped with chlorhexidine in a sterile fashion, and a sterile drape was applied covering the operative field. Maximum barrier sterile technique with sterile gowns and gloves were used for the procedure. A timeout was performed prior to the initiation of the procedure. After creating a small venotomy incision, a micropuncture kit was utilized to access the internal  jugular vein. Real-time ultrasound guidance was utilized for vascular access including the acquisition of a permanent ultrasound image documenting patency of the accessed vessel. The microwire was utilized to measure appropriate catheter length. A stiff Glidewire was advanced to the level of the IVC and the micropuncture sheath was exchanged for a peel-away sheath. A palindrome tunneled hemodialysis catheter measuring 19 cm from tip to cuff was tunneled in a retrograde fashion from the anterior chest wall to the venotomy incision. The catheter was then placed through the peel-away sheath with tips ultimately positioned within the superior aspect of the right atrium. Final catheter positioning was confirmed and documented with a spot radiographic image. The catheter aspirates and flushes normally. The catheter was flushed with appropriate volume heparin dwells. The catheter exit site was secured with a 0-Prolene retention suture. The venotomy incision was closed with an interrupted 4-0 Vicryl, Dermabond and Steri-strips. Dressings were applied. The patient tolerated the procedure well without immediate post procedural complication. IMPRESSION: Successful placement of 19 cm tip to cuff tunneled hemodialysis catheter via the right internal jugular vein with tips terminating within the superior aspect of the right atrium. The catheter is ready for immediate use. Electronically Signed   By: Sandi Mariscal M.D.   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 Recommend to send tissue for culture tomorrow. Anticipate restarting antibiotic clock for 6 wk using 5/13 as day 1  Leukocytosis = resolved.   aki = was on CRRT. Followed by renal to determine need/schedule for renal replacement.  [facility] Infectious Diseases Cell: [phone] Pager: [phone]  08/02/2020, 7:24 PM

## 2020-08-02 NOTE — Progress Notes (Signed)
Patient to go to OR 5/12. Instructed to reconsult for PIV placement at that time d/t risk of clotting or dislogment of PIV before surgery.  Fran Lowes, RN VAST

## 2020-08-02 NOTE — Progress Notes (Signed)
eLink Physician-Brief Progress Note Patient Name: Alex [name] DOB: 04/19/1980 MRN: [id number]   Date of Service  08/02/2020  HPI/Events of Note  Patient threatening to disconnect himself from CRRT and leave AMA, he is calm but insistent.  eICU Interventions  I tried unsuccessfully to reason with him, I have asked the ground crew to see him in person in the hope that face to face interaction might be more successful in dissuading him from carrying out his threat.        Frederik Pear 08/02/2020, 7:33 PM

## 2020-08-02 NOTE — Progress Notes (Signed)
Nutrition Follow-up  DOCUMENTATION CODES:   Obesity unspecified  INTERVENTION:   - Double protein portions TID with meals  - Trial ProSource Plus 30 ml po BID, each supplement provides 100 kcal and 15 grams of protein  - B-complex with vitamin C to account for losses with CRRT  - Encourage ongoing adequate PO intake  - d/c rena-vit and Nepro shake  NUTRITION DIAGNOSIS:   Inadequate oral intake related to inability to eat,lethargy/confusion as evidenced by NPO status.  Progressing, pt now on regular diet  GOAL:   Patient will meet greater than or equal to 90% of their needs  Progressing  MONITOR:   Diet advancement,Labs,Weight trends,Skin,I & O's  REASON FOR ASSESSMENT:   Consult Enteral/tube feeding initiation and management  ASSESSMENT:   40 year old male who presented to the ED on 4/26 with AMS. PMH of polysubstance abuse. Pt admitted with septic shock secondary to GNR bacteremia and acute renal failure.  4/28 - intubated, TEE, CRRT initiated 4/29 - Cortrak placed (gastric tip) 5/04 - TEE 5/05 - extubated to BiPAP, CRRT discontinued (pt pulled dialysis catheter) 5/06 - CRRT restarted due to severe hyperkalemia 5/10 - s/p tunneled HD catheter placement  Discussed pt with RN and during ICU rounds. CTS rescheduled OR to tomorrow for AVR and root replacement, 5/12. Pt to be NPO at midnight.  Per RN, pt eating well and not adhering to fluid restriction. Pt ate a large meal from [employer] last night per RN. Pt not consuming Nepro supplements. Will discontinue these and order double protein portions TID with meals. Will also trial ProSource Plus.  Noted lunch meal tray at bedside. Pt had consumed ~75% of lunch meal at time of RD visit and was sleeping soundly. Pt did not awaken to RD voice or touch.  Admit weight: 127.1 kg Current weight:114.9 kg  Meal Completion: 50-100%  Medications reviewed and include: Nepro BID, aranesp weekly, colace, fenofibrate,  melatonin, rena-vit, protonix, heparin  Labs reviewed: sodium 131, BUN 34, creatinine 3/19, magnesium 2.6, hemoglobin 8.1 CBG's: 84-151 x 24 hours  CRRT UF: 2696 x 24 hours I/O's: -2.0 L since admit  Diet Order:   Diet Order            Diet NPO time specified  Diet effective midnight           Diet regular Room service appropriate? Yes; Fluid consistency: Thin; Fluid restriction: 1200 mL Fluid  Diet effective now                 EDUCATION NEEDS:   Not appropriate for education at this time  Skin:  Skin Assessment: Skin Integrity Issues: Stage II: coccyx  Last BM:  08/02/20 type 6  Height:   Ht Readings from Last 1 Encounters:  07/31/20 '6\' 4"'$  (1.93 m)    Weight:   Wt Readings from Last 1 Encounters:  08/02/20 114.9 kg    Ideal Body Weight:  91.8 kg  BMI:  Body mass index is 30.83 kg/m.  Estimated Nutritional Needs:   Kcal:  2500-2700  Protein:  120-140 grams  Fluid:  >/= 2.0 L    Gustavus Bryant, MS, RD, LDN Inpatient Clinical Dietitian Please see AMiON for contact information.

## 2020-08-02 NOTE — Progress Notes (Signed)
Physical Therapy Treatment Patient Details Name: Alex [name] MRN: [id number] DOB: 1980-10-18 Today's Date: 08/02/2020    History of Present Illness 40 yo male smoker brought to [facility] ER 07/18/20 with altered mental status. Found to have hypotension, AKI, thrombocytopenia, splenomegaly, lactic acidosis, elevated bilirubin, hypoglycemia, polysubstance abuse. Treated for sepsis Intubated 4/28-5/5. 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 5/10 to IR for tunneled HD cath. Plan for OR on 5/12 for valve repair.    PT Comments    Patient eager to work with PT. Limited ability to ambulate due to CRRT lines, however was able to side-step Left and right along EOB. Completed standing exercises and returned to chair position in bed. Making good progress towards goals.     Follow Up Recommendations  No PT follow up     Equipment Recommendations  None recommended by PT    Recommendations for Other Services       Precautions / Restrictions Precautions Precautions: Fall Precaution Comments: impulsivity; pt is 6'4"    Mobility  Bed Mobility Overal bed mobility: Needs Assistance Bed Mobility: Rolling;Sit to Supine;Sidelying to Sit Rolling: Min guard Sidelying to sit: Min guard   Sit to supine: Min guard   General bed mobility comments: close guarding for CRRT lines; pt with tendency to move impulsively    Transfers Overall transfer level: Needs assistance Equipment used: None Transfers: Sit to/from Stand Sit to Stand: Min assist         General transfer comment: assist to steady, x 4 reps  Ambulation/Gait Ambulation/Gait assistance: Min assist Gait Distance (Feet): 8 Feet Assistive device: None Gait Pattern/deviations: Step-to pattern      General Gait Details: pt took side steps to [facility], then FOB x 2 reps; stood for max of 2 minutes   Stairs             Wheelchair Mobility    Modified Rankin (Stroke Patients Only) Modified Rankin (Stroke Patients Only) Pre-Morbid Rankin Score: No symptoms Modified Rankin: Moderately severe disability     Balance Overall balance assessment: Needs assistance   Sitting balance-Leahy Scale: Fair     Standing balance support: No upper extremity supported Standing balance-Leahy Scale: Fair                              Cognition Arousal/Alertness: Awake/alert Behavior During Therapy: Impulsive;Restless Overall Cognitive Status: Impaired/Different from baseline Area of Impairment: Attention;Memory;Safety/judgement;Awareness;Problem solving                 Orientation Level:  (NT) Current Attention Level: Sustained Memory: Decreased short-term memory;Decreased recall of precautions Following Commands: Follows one step commands consistently Safety/Judgement: Decreased awareness of safety;Decreased awareness of deficits Awareness: Intellectual Problem Solving: Difficulty sequencing;Requires verbal cues;Slow processing General Comments: on CRRT; Requires multiple cues to move slowly and mindfully due to R tunneled catheter      Exercises General Exercises - Lower Extremity Ankle Circles/Pumps: AROM;Both;10 reps Long Arc Quad: AROM;Both;10 reps Hip ABduction/ADduction: AROM;Both;5 reps;Standing (standing step-taps laterally) Mini-Sqauts: AROM;10 reps (x 2 sets)    General Comments        Pertinent Vitals/Pain Pain Assessment: No/denies pain Faces Pain Scale: No hurt    Home Living  Prior Function            PT Goals (current goals can now be found in the care plan section) Acute Rehab PT Goals Patient Stated Goal: to drink PT Goal Formulation: With patient Time For Goal Achievement: 08/10/20 Potential to  Achieve Goals: Good Progress towards PT goals: Progressing toward goals    Frequency    Min 3X/week      PT Plan Current plan remains appropriate    Co-evaluation              AM-PAC PT "6 Clicks" Mobility   Outcome Measure  Help needed turning from your back to your side while in a flat bed without using bedrails?: None Help needed moving from lying on your back to sitting on the side of a flat bed without using bedrails?: A Little Help needed moving to and from a bed to a chair (including a wheelchair)?: A Little Help needed standing up from a chair using your arms (e.g., wheelchair or bedside chair)?: A Little Help needed to walk in hospital room?: A Little Help needed climbing 3-5 steps with a railing? : A Little 6 Click Score: 19    End of Session Equipment Utilized During Treatment: Other (comment) (on CRRT) Activity Tolerance: Patient tolerated treatment well Patient left: in bed;with call bell/phone within reach (pt not comfortable in recliner and instead into chair position in bed) Nurse Communication: Mobility status PT Visit Diagnosis: Unsteadiness on feet (R26.81);Other abnormalities of gait and mobility (R26.89)     Time: [id number] PT Time Calculation (min) (ACUTE ONLY): 22 min  Charges:  $Therapeutic Activity: 8-22 mins                      Arby Barrette, PT Pager [phone]    [name] 08/02/2020, 9:09 AM

## 2020-08-02 NOTE — Progress Notes (Signed)
NAME:  Alex [name], MRN:  [id number], DOB:  07/23/1980, LOS: 39 ADMISSION DATE:  07/18/2020, CONSULTATION DATE:  4/26 REFERRING MD:  Dr. Florina Ou, CHIEF COMPLAINT:  AMS   History of Present Illness:  40 yo male smoker brought to [facility] ER with altered mental status.  Noted to have headache and feeling feverish before admission.  Found to have hypotension, AKI, thrombocytopenia, splenomegaly, lactic acidosis, elevated bilirubin, hypoglycemia.  Started on antibiotics and pressors for possible sepsis, but no clear source of infection.  Normal renal function from February 2022.  Uses suboxone for history of substance abuse.  UDS positive for amphetamines.  Transfer to [facility] for further therapy.  Hx from chart, medical team, and pt's mother.  Pertinent  Medical History  Polysubstance Abuse - amphetamines, heroin. On Suboxone.   Ruptured Disc  Significant Hospital Events: Including procedures, antibiotic start and stop dates in addition to other pertinent events   . 4/26 transfer from [facility] to [facility] . CT head 4/26 >> no acute findings . CT abd/pelvis 4/26 >> cholelithiasis w/o inflammatory chagnes, mild hepatomegaly (25 cm), moderate splenomegaly (16.7 cm), 2 mm non obstructing kidney stone on Lt . 4/27 CVL placed. Pressors escalating.  . 4/28 intubated for TEE, CVC, HD, and A-line placed. CRRT started  . Remains on CRRT . MRI on 4/30-multiple infarcts . 5/4: repeat TEE shows an aortic valve abscess . 5/5: extubated , self-removed HD cath so CRRT holiday> developed severe hyperkalemia overnight. . 5/6: new temp HD cath placed  . 5/8: new temp HD cath placed after prior one self-removed by patient . 5/9: Orthopantogram neg for abscess . 5/10: HD cath replaced by IR Perm Cath  Interim History / Subjective:  Received 2U PRBC yesterday. CRRT clotted this morning and will plan to start tonight  Objective   Blood pressure (!) 159/32, pulse 86, temperature 97.7 F (36.5 C), temperature source Oral,  resp. rate 19, height '6\' 4"'$  (1.93 m), weight 114.9 kg, SpO2 100 %.        Intake/Output Summary (Last 24 hours) at 08/02/2020 1003 Last data filed at 08/02/2020 0800 Gross per 24 hour  Intake 2072.14 ml  Output 2325 ml  Net -252.86 ml   Filed Weights   07/31/20 0437 08/01/20 0323 08/02/20 0349  Weight: 100.2 kg 102.6 kg 114.9 kg    Physical Exam: General: Well-appearing, no acute distress HENT: [place], AT, OP clear, MMM Eyes: EOMI, no scleral icterus Respiratory: Clear to auscultation bilaterally.  No crackles, wheezing or rales Cardiovascular: Diastolic murmur, no JVD GI: BS+, soft, nontender Extremities:-Edema,-tenderness Neuro: AAO x4, CNII-XII grossly intact  Labs/imaging that I havepersonally reviewed    5/9 Hg 6.4>6.5 5/11 Hg 8.1 post-transfusion. Appropriate value after 2U PRBC  Imaging, labs and test noted above have been reviewed independently by me.  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  -Continue cefepime per ID -CTS rescheduled OR to 5/12. NPO tonight. -Appreciate ID and CTS input  Acute kidney injury s/p perm cath 5/10 Hyperkalemia Anuria Likely ischemic ATN in the setting of septic shock and septic emboli.  Requiring CRRT since 4/28, however traumatically pulled out HD line on 5/5 and 5/8 S/p perm cath on 5/10 -Bladder scans Qshift to monitor for urine production -Continue CRRT peri-op per Nephro. Once stable will transition to iHD  Acute anemia  Thrombocytopenia resolved -transfuse for Hb <7 or hemodynamically significant bleeding -Transfuse PRBC x 3U total on 5/9 and 5/10  Polysubstance abuse -Suboxone, amphetamines -Cessation education has been provided  Agitation, impulsiveness -OOB to chair today  Best practice    Diet:  Oral  Pain/Anxiety/Delirium protocol (if indicated): No VAP protocol (if indicated): Not indicated DVT prophylaxis: Subcutaneous Heparin and SCD GI prophylaxis: PPI Glucose control:  SSI No Central venous access:  N/A Arterial line:  N/A Foley:  N/A Mobility:  bed rest  PT consulted: N/A Last date of multidisciplinary goals of care discussion: mother updated at bedside 5/7 Code Status:  full code Disposition: ICU for CRRT  The patient is critically ill with multiple organ systems failure and requires high complexity decision making for assessment and support, frequent evaluation and titration of therapies, application of advanced monitoring technologies and extensive interpretation of multiple databases.  Independent Critical Care Time: 32 Minutes.   Rodman Pickle, M.D. [facility] Pulmonary/Critical Care Medicine 08/02/2020 10:16 AM   Please see Amion for pager number to reach on-call Pulmonary and Critical Care Team.

## 2020-08-03 ENCOUNTER — Inpatient Hospital Stay (HOSPITAL_COMMUNITY)
Admission: EM | Discharge status: Against Medical Advice | Payer: Self-pay | Source: Home / Self Care | Attending: Critical Care Medicine

## 2020-08-03 ENCOUNTER — Inpatient Hospital Stay (HOSPITAL_COMMUNITY): Payer: 59

## 2020-08-03 ENCOUNTER — Inpatient Hospital Stay (HOSPITAL_COMMUNITY): Payer: 59 | Admitting: Student

## 2020-08-03 ENCOUNTER — Encounter (HOSPITAL_COMMUNITY): Payer: Self-pay | Admitting: Internal Medicine

## 2020-08-03 DIAGNOSIS — I339 Acute and subacute endocarditis, unspecified: Secondary | ICD-10-CM

## 2020-08-03 DIAGNOSIS — I351 Nonrheumatic aortic (valve) insufficiency: Secondary | ICD-10-CM

## 2020-08-03 HISTORY — PX: TEE WITHOUT CARDIOVERSION: SHX5443

## 2020-08-03 HISTORY — PX: BENTALL PROCEDURE: SHX5058

## 2020-08-03 LAB — POCT I-STAT, CHEM 8
BUN: 36 mg/dL — ABNORMAL HIGH (ref 6–20)
BUN: 35 mg/dL — ABNORMAL HIGH (ref 6–20)
BUN: 37 mg/dL — ABNORMAL HIGH (ref 6–20)
BUN: 41 mg/dL — ABNORMAL HIGH (ref 6–20)
BUN: 44 mg/dL — ABNORMAL HIGH (ref 6–20)
BUN: 52 mg/dL — ABNORMAL HIGH (ref 6–20)
BUN: 52 mg/dL — ABNORMAL HIGH (ref 6–20)
BUN: 55 mg/dL — ABNORMAL HIGH (ref 6–20)
Calcium, Ion: 1.19 mmol/L (ref 1.15–1.40)
Calcium, Ion: 1.13 mmol/L — ABNORMAL LOW (ref 1.15–1.40)
Calcium, Ion: 1.05 mmol/L — ABNORMAL LOW (ref 1.15–1.40)
Calcium, Ion: 1.05 mmol/L — ABNORMAL LOW (ref 1.15–1.40)
Calcium, Ion: 1.04 mmol/L — ABNORMAL LOW (ref 1.15–1.40)
Calcium, Ion: 1.04 mmol/L — ABNORMAL LOW (ref 1.15–1.40)
Calcium, Ion: 1.02 mmol/L — ABNORMAL LOW (ref 1.15–1.40)
Calcium, Ion: 1.16 mmol/L (ref 1.15–1.40)
Chloride: 102 mmol/L (ref 98–111)
Chloride: 102 mmol/L (ref 98–111)
Chloride: 102 mmol/L (ref 98–111)
Chloride: 102 mmol/L (ref 98–111)
Chloride: 102 mmol/L (ref 98–111)
Chloride: 105 mmol/L (ref 98–111)
Chloride: 104 mmol/L (ref 98–111)
Chloride: 105 mmol/L (ref 98–111)
Creatinine, Ser: 4.2 mg/dL — ABNORMAL HIGH (ref 0.61–1.24)
Creatinine, Ser: 4.1 mg/dL — ABNORMAL HIGH (ref 0.61–1.24)
Creatinine, Ser: 3.5 mg/dL — ABNORMAL HIGH (ref 0.61–1.24)
Creatinine, Ser: 3.9 mg/dL — ABNORMAL HIGH (ref 0.61–1.24)
Creatinine, Ser: 3.9 mg/dL — ABNORMAL HIGH (ref 0.61–1.24)
Creatinine, Ser: 3.9 mg/dL — ABNORMAL HIGH (ref 0.61–1.24)
Creatinine, Ser: 3.7 mg/dL — ABNORMAL HIGH (ref 0.61–1.24)
Creatinine, Ser: 3.9 mg/dL — ABNORMAL HIGH (ref 0.61–1.24)
Glucose, Bld: 102 mg/dL — ABNORMAL HIGH (ref 70–99)
Glucose, Bld: 102 mg/dL — ABNORMAL HIGH (ref 70–99)
Glucose, Bld: 163 mg/dL — ABNORMAL HIGH (ref 70–99)
Glucose, Bld: 162 mg/dL — ABNORMAL HIGH (ref 70–99)
Glucose, Bld: 174 mg/dL — ABNORMAL HIGH (ref 70–99)
Glucose, Bld: 179 mg/dL — ABNORMAL HIGH (ref 70–99)
Glucose, Bld: 136 mg/dL — ABNORMAL HIGH (ref 70–99)
Glucose, Bld: 159 mg/dL — ABNORMAL HIGH (ref 70–99)
HCT: 19 % — ABNORMAL LOW (ref 39.0–52.0)
HCT: 17 % — ABNORMAL LOW (ref 39.0–52.0)
HCT: 25 % — ABNORMAL LOW (ref 39.0–52.0)
HCT: 26 % — ABNORMAL LOW (ref 39.0–52.0)
HCT: 24 % — ABNORMAL LOW (ref 39.0–52.0)
HCT: 26 % — ABNORMAL LOW (ref 39.0–52.0)
HCT: 24 % — ABNORMAL LOW (ref 39.0–52.0)
HCT: 24 % — ABNORMAL LOW (ref 39.0–52.0)
Hemoglobin: 6.5 g/dL — CL (ref 13.0–17.0)
Hemoglobin: 5.8 g/dL — CL (ref 13.0–17.0)
Hemoglobin: 8.5 g/dL — ABNORMAL LOW (ref 13.0–17.0)
Hemoglobin: 8.8 g/dL — ABNORMAL LOW (ref 13.0–17.0)
Hemoglobin: 8.2 g/dL — ABNORMAL LOW (ref 13.0–17.0)
Hemoglobin: 8.8 g/dL — ABNORMAL LOW (ref 13.0–17.0)
Hemoglobin: 8.2 g/dL — ABNORMAL LOW (ref 13.0–17.0)
Hemoglobin: 8.2 g/dL — ABNORMAL LOW (ref 13.0–17.0)
Potassium: 4.4 mmol/L (ref 3.5–5.1)
Potassium: 4.1 mmol/L (ref 3.5–5.1)
Potassium: 5.4 mmol/L — ABNORMAL HIGH (ref 3.5–5.1)
Potassium: 5.1 mmol/L (ref 3.5–5.1)
Potassium: 5.9 mmol/L — ABNORMAL HIGH (ref 3.5–5.1)
Potassium: 6.2 mmol/L — ABNORMAL HIGH (ref 3.5–5.1)
Potassium: 5.4 mmol/L — ABNORMAL HIGH (ref 3.5–5.1)
Potassium: 6.6 mmol/L (ref 3.5–5.1)
Sodium: 135 mmol/L (ref 135–145)
Sodium: 134 mmol/L — ABNORMAL LOW (ref 135–145)
Sodium: 134 mmol/L — ABNORMAL LOW (ref 135–145)
Sodium: 134 mmol/L — ABNORMAL LOW (ref 135–145)
Sodium: 133 mmol/L — ABNORMAL LOW (ref 135–145)
Sodium: 133 mmol/L — ABNORMAL LOW (ref 135–145)
Sodium: 133 mmol/L — ABNORMAL LOW (ref 135–145)
Sodium: 135 mmol/L (ref 135–145)
TCO2: 26 mmol/L (ref 22–32)
TCO2: 24 mmol/L (ref 22–32)
TCO2: 24 mmol/L (ref 22–32)
TCO2: 24 mmol/L (ref 22–32)
TCO2: 23 mmol/L (ref 22–32)
TCO2: 23 mmol/L (ref 22–32)
TCO2: 22 mmol/L (ref 22–32)
TCO2: 23 mmol/L (ref 22–32)

## 2020-08-03 LAB — CBC
HCT: 22.2 % — ABNORMAL LOW (ref 39.0–52.0)
HCT: 29.9 % — ABNORMAL LOW (ref 39.0–52.0)
HCT: 23.6 % — ABNORMAL LOW (ref 39.0–52.0)
Hemoglobin: 7.2 g/dL — ABNORMAL LOW (ref 13.0–17.0)
Hemoglobin: 9.6 g/dL — ABNORMAL LOW (ref 13.0–17.0)
Hemoglobin: 7.6 g/dL — ABNORMAL LOW (ref 13.0–17.0)
MCH: 28.1 pg (ref 26.0–34.0)
MCH: 28.4 pg (ref 26.0–34.0)
MCH: 28 pg (ref 26.0–34.0)
MCHC: 32.4 g/dL (ref 30.0–36.0)
MCHC: 32.1 g/dL (ref 30.0–36.0)
MCHC: 32.2 g/dL (ref 30.0–36.0)
MCV: 86.7 fL (ref 80.0–100.0)
MCV: 88.5 fL (ref 80.0–100.0)
MCV: 87.1 fL (ref 80.0–100.0)
Platelets: 209 10*3/uL (ref 150–400)
Platelets: 143 10*3/uL — ABNORMAL LOW (ref 150–400)
Platelets: 166 10*3/uL (ref 150–400)
RBC: 2.56 MIL/uL — ABNORMAL LOW (ref 4.22–5.81)
RBC: 3.38 MIL/uL — ABNORMAL LOW (ref 4.22–5.81)
RBC: 2.71 MIL/uL — ABNORMAL LOW (ref 4.22–5.81)
RDW: 17.2 % — ABNORMAL HIGH (ref 11.5–15.5)
RDW: 15.9 % — ABNORMAL HIGH (ref 11.5–15.5)
RDW: 16.1 % — ABNORMAL HIGH (ref 11.5–15.5)
WBC: 8.8 10*3/uL (ref 4.0–10.5)
WBC: 22.6 10*3/uL — ABNORMAL HIGH (ref 4.0–10.5)
WBC: 17.6 10*3/uL — ABNORMAL HIGH (ref 4.0–10.5)
nRBC: 0 % (ref 0.0–0.2)
nRBC: 0 % (ref 0.0–0.2)
nRBC: 0 % (ref 0.0–0.2)

## 2020-08-03 LAB — POCT I-STAT 7, (LYTES, BLD GAS, ICA,H+H)
Acid-Base Excess: 0 mmol/L (ref 0.0–2.0)
Acid-Base Excess: 1 mmol/L (ref 0.0–2.0)
Acid-Base Excess: 0 mmol/L (ref 0.0–2.0)
Acid-base deficit: 1 mmol/L (ref 0.0–2.0)
Acid-base deficit: 3 mmol/L — ABNORMAL HIGH (ref 0.0–2.0)
Acid-base deficit: 3 mmol/L — ABNORMAL HIGH (ref 0.0–2.0)
Acid-base deficit: 5 mmol/L — ABNORMAL HIGH (ref 0.0–2.0)
Acid-base deficit: 6 mmol/L — ABNORMAL HIGH (ref 0.0–2.0)
Acid-base deficit: 6 mmol/L — ABNORMAL HIGH (ref 0.0–2.0)
Acid-base deficit: 4 mmol/L — ABNORMAL HIGH (ref 0.0–2.0)
Bicarbonate: 25.5 mmol/L (ref 20.0–28.0)
Bicarbonate: 26.1 mmol/L (ref 20.0–28.0)
Bicarbonate: 25.1 mmol/L (ref 20.0–28.0)
Bicarbonate: 24.5 mmol/L (ref 20.0–28.0)
Bicarbonate: 22.1 mmol/L (ref 20.0–28.0)
Bicarbonate: 23.3 mmol/L (ref 20.0–28.0)
Bicarbonate: 20.8 mmol/L (ref 20.0–28.0)
Bicarbonate: 20.1 mmol/L (ref 20.0–28.0)
Bicarbonate: 18.2 mmol/L — ABNORMAL LOW (ref 20.0–28.0)
Bicarbonate: 19.6 mmol/L — ABNORMAL LOW (ref 20.0–28.0)
Calcium, Ion: 1.16 mmol/L (ref 1.15–1.40)
Calcium, Ion: 1.02 mmol/L — ABNORMAL LOW (ref 1.15–1.40)
Calcium, Ion: 1.03 mmol/L — ABNORMAL LOW (ref 1.15–1.40)
Calcium, Ion: 1.06 mmol/L — ABNORMAL LOW (ref 1.15–1.40)
Calcium, Ion: 1.03 mmol/L — ABNORMAL LOW (ref 1.15–1.40)
Calcium, Ion: 1.04 mmol/L — ABNORMAL LOW (ref 1.15–1.40)
Calcium, Ion: 1.13 mmol/L — ABNORMAL LOW (ref 1.15–1.40)
Calcium, Ion: 1.12 mmol/L — ABNORMAL LOW (ref 1.15–1.40)
Calcium, Ion: 1.02 mmol/L — ABNORMAL LOW (ref 1.15–1.40)
Calcium, Ion: 1.04 mmol/L — ABNORMAL LOW (ref 1.15–1.40)
HCT: 21 % — ABNORMAL LOW (ref 39.0–52.0)
HCT: 20 % — ABNORMAL LOW (ref 39.0–52.0)
HCT: 26 % — ABNORMAL LOW (ref 39.0–52.0)
HCT: 26 % — ABNORMAL LOW (ref 39.0–52.0)
HCT: 24 % — ABNORMAL LOW (ref 39.0–52.0)
HCT: 26 % — ABNORMAL LOW (ref 39.0–52.0)
HCT: 26 % — ABNORMAL LOW (ref 39.0–52.0)
HCT: 30 % — ABNORMAL LOW (ref 39.0–52.0)
HCT: 25 % — ABNORMAL LOW (ref 39.0–52.0)
HCT: 23 % — ABNORMAL LOW (ref 39.0–52.0)
Hemoglobin: 7.1 g/dL — ABNORMAL LOW (ref 13.0–17.0)
Hemoglobin: 6.8 g/dL — CL (ref 13.0–17.0)
Hemoglobin: 8.8 g/dL — ABNORMAL LOW (ref 13.0–17.0)
Hemoglobin: 8.8 g/dL — ABNORMAL LOW (ref 13.0–17.0)
Hemoglobin: 8.2 g/dL — ABNORMAL LOW (ref 13.0–17.0)
Hemoglobin: 8.8 g/dL — ABNORMAL LOW (ref 13.0–17.0)
Hemoglobin: 8.8 g/dL — ABNORMAL LOW (ref 13.0–17.0)
Hemoglobin: 10.2 g/dL — ABNORMAL LOW (ref 13.0–17.0)
Hemoglobin: 8.5 g/dL — ABNORMAL LOW (ref 13.0–17.0)
Hemoglobin: 7.8 g/dL — ABNORMAL LOW (ref 13.0–17.0)
O2 Saturation: 100 %
O2 Saturation: 100 %
O2 Saturation: 100 %
O2 Saturation: 100 %
O2 Saturation: 100 %
O2 Saturation: 99 %
O2 Saturation: 99 %
O2 Saturation: 98 %
O2 Saturation: 99 %
O2 Saturation: 96 %
Patient temperature: 37.8
Patient temperature: 38.1
Patient temperature: 37.5
Potassium: 4.3 mmol/L (ref 3.5–5.1)
Potassium: 4.4 mmol/L (ref 3.5–5.1)
Potassium: 5.3 mmol/L — ABNORMAL HIGH (ref 3.5–5.1)
Potassium: 5.7 mmol/L — ABNORMAL HIGH (ref 3.5–5.1)
Potassium: 6.2 mmol/L — ABNORMAL HIGH (ref 3.5–5.1)
Potassium: 6.5 mmol/L (ref 3.5–5.1)
Potassium: 5.4 mmol/L — ABNORMAL HIGH (ref 3.5–5.1)
Potassium: 6.3 mmol/L (ref 3.5–5.1)
Potassium: 6.4 mmol/L (ref 3.5–5.1)
Potassium: 6.7 mmol/L (ref 3.5–5.1)
Sodium: 135 mmol/L (ref 135–145)
Sodium: 135 mmol/L (ref 135–145)
Sodium: 134 mmol/L — ABNORMAL LOW (ref 135–145)
Sodium: 133 mmol/L — ABNORMAL LOW (ref 135–145)
Sodium: 133 mmol/L — ABNORMAL LOW (ref 135–145)
Sodium: 133 mmol/L — ABNORMAL LOW (ref 135–145)
Sodium: 136 mmol/L (ref 135–145)
Sodium: 134 mmol/L — ABNORMAL LOW (ref 135–145)
Sodium: 137 mmol/L (ref 135–145)
Sodium: 136 mmol/L (ref 135–145)
TCO2: 27 mmol/L (ref 22–32)
TCO2: 27 mmol/L (ref 22–32)
TCO2: 26 mmol/L (ref 22–32)
TCO2: 26 mmol/L (ref 22–32)
TCO2: 23 mmol/L (ref 22–32)
TCO2: 25 mmol/L (ref 22–32)
TCO2: 22 mmol/L (ref 22–32)
TCO2: 21 mmol/L — ABNORMAL LOW (ref 22–32)
TCO2: 19 mmol/L — ABNORMAL LOW (ref 22–32)
TCO2: 21 mmol/L — ABNORMAL LOW (ref 22–32)
pCO2 arterial: 48.1 mmHg — ABNORMAL HIGH (ref 32.0–48.0)
pCO2 arterial: 44.7 mmHg (ref 32.0–48.0)
pCO2 arterial: 44 mmHg (ref 32.0–48.0)
pCO2 arterial: 41.3 mmHg (ref 32.0–48.0)
pCO2 arterial: 37.4 mmHg (ref 32.0–48.0)
pCO2 arterial: 45.3 mmHg (ref 32.0–48.0)
pCO2 arterial: 41.3 mmHg (ref 32.0–48.0)
pCO2 arterial: 41.8 mmHg (ref 32.0–48.0)
pCO2 arterial: 30.2 mmHg — ABNORMAL LOW (ref 32.0–48.0)
pCO2 arterial: 31.4 mmHg — ABNORMAL LOW (ref 32.0–48.0)
pH, Arterial: 7.333 — ABNORMAL LOW (ref 7.350–7.450)
pH, Arterial: 7.374 (ref 7.350–7.450)
pH, Arterial: 7.363 (ref 7.350–7.450)
pH, Arterial: 7.382 (ref 7.350–7.450)
pH, Arterial: 7.319 — ABNORMAL LOW (ref 7.350–7.450)
pH, Arterial: 7.38 (ref 7.350–7.450)
pH, Arterial: 7.31 — ABNORMAL LOW (ref 7.350–7.450)
pH, Arterial: 7.295 — ABNORMAL LOW (ref 7.350–7.450)
pH, Arterial: 7.392 (ref 7.350–7.450)
pH, Arterial: 7.405 (ref 7.350–7.450)
pO2, Arterial: 307 mmHg — ABNORMAL HIGH (ref 83.0–108.0)
pO2, Arterial: 350 mmHg — ABNORMAL HIGH (ref 83.0–108.0)
pO2, Arterial: 342 mmHg — ABNORMAL HIGH (ref 83.0–108.0)
pO2, Arterial: 345 mmHg — ABNORMAL HIGH (ref 83.0–108.0)
pO2, Arterial: 148 mmHg — ABNORMAL HIGH (ref 83.0–108.0)
pO2, Arterial: 282 mmHg — ABNORMAL HIGH (ref 83.0–108.0)
pO2, Arterial: 176 mmHg — ABNORMAL HIGH (ref 83.0–108.0)
pO2, Arterial: 116 mmHg — ABNORMAL HIGH (ref 83.0–108.0)
pO2, Arterial: 122 mmHg — ABNORMAL HIGH (ref 83.0–108.0)
pO2, Arterial: 81 mmHg — ABNORMAL LOW (ref 83.0–108.0)

## 2020-08-03 LAB — CULTURE, BLOOD (ROUTINE X 2)
Culture: NO GROWTH
Culture: NO GROWTH

## 2020-08-03 LAB — POCT I-STAT EG7
Acid-Base Excess: 2 mmol/L (ref 0.0–2.0)
Bicarbonate: 27.9 mmol/L (ref 20.0–28.0)
Calcium, Ion: 1.06 mmol/L — ABNORMAL LOW (ref 1.15–1.40)
HCT: 20 % — ABNORMAL LOW (ref 39.0–52.0)
Hemoglobin: 6.8 g/dL — CL (ref 13.0–17.0)
O2 Saturation: 57 %
Potassium: 5.1 mmol/L (ref 3.5–5.1)
Sodium: 137 mmol/L (ref 135–145)
TCO2: 29 mmol/L (ref 22–32)
pCO2, Ven: 50.4 mmHg (ref 44.0–60.0)
pH, Ven: 7.352 (ref 7.250–7.430)
pO2, Ven: 32 mmHg (ref 32.0–45.0)

## 2020-08-03 LAB — MAGNESIUM
Magnesium: 2.3 mg/dL (ref 1.7–2.4)
Magnesium: 3.3 mg/dL — ABNORMAL HIGH (ref 1.7–2.4)

## 2020-08-03 LAB — RENAL FUNCTION PANEL
Albumin: 1.7 g/dL — ABNORMAL LOW (ref 3.5–5.0)
Albumin: 2.3 g/dL — ABNORMAL LOW (ref 3.5–5.0)
Anion gap: 8 (ref 5–15)
Anion gap: 7 (ref 5–15)
BUN: 38 mg/dL — ABNORMAL HIGH (ref 6–20)
BUN: 45 mg/dL — ABNORMAL HIGH (ref 6–20)
CO2: 24 mmol/L (ref 22–32)
CO2: 19 mmol/L — ABNORMAL LOW (ref 22–32)
Calcium: 8 mg/dL — ABNORMAL LOW (ref 8.9–10.3)
Calcium: 6.9 mg/dL — ABNORMAL LOW (ref 8.9–10.3)
Chloride: 99 mmol/L (ref 98–111)
Chloride: 108 mmol/L (ref 98–111)
Creatinine, Ser: 3.86 mg/dL — ABNORMAL HIGH (ref 0.61–1.24)
Creatinine, Ser: 4.16 mg/dL — ABNORMAL HIGH (ref 0.61–1.24)
GFR, Estimated: 20 mL/min — ABNORMAL LOW (ref >60)
GFR, Estimated: 18 mL/min — ABNORMAL LOW (ref >60)
Glucose, Bld: 97 mg/dL (ref 70–99)
Glucose, Bld: 110 mg/dL — ABNORMAL HIGH (ref 70–99)
Phosphorus: 3.3 mg/dL (ref 2.5–4.6)
Phosphorus: 5.7 mg/dL — ABNORMAL HIGH (ref 2.5–4.6)
Potassium: 4.3 mmol/L (ref 3.5–5.1)
Potassium: 6.4 mmol/L (ref 3.5–5.1)
Sodium: 131 mmol/L — ABNORMAL LOW (ref 135–145)
Sodium: 134 mmol/L — ABNORMAL LOW (ref 135–145)

## 2020-08-03 LAB — GLUCOSE, CAPILLARY
Glucose-Capillary: 118 mg/dL — ABNORMAL HIGH (ref 70–99)
Glucose-Capillary: 119 mg/dL — ABNORMAL HIGH (ref 70–99)
Glucose-Capillary: 120 mg/dL — ABNORMAL HIGH (ref 70–99)
Glucose-Capillary: 122 mg/dL — ABNORMAL HIGH (ref 70–99)
Glucose-Capillary: 163 mg/dL — ABNORMAL HIGH (ref 70–99)
Glucose-Capillary: 147 mg/dL — ABNORMAL HIGH (ref 70–99)

## 2020-08-03 LAB — PREPARE RBC (CROSSMATCH)

## 2020-08-03 LAB — PROTIME-INR
INR: 1.6 — ABNORMAL HIGH (ref 0.8–1.2)
Prothrombin Time: 19 seconds — ABNORMAL HIGH (ref 11.4–15.2)

## 2020-08-03 LAB — FIBRINOGEN: Fibrinogen: 415 mg/dL (ref 210–475)

## 2020-08-03 LAB — APTT
aPTT: 29 seconds (ref 24–36)
aPTT: 31 seconds (ref 24–36)

## 2020-08-03 LAB — PLATELET COUNT
Platelets: 158 10*3/uL (ref 150–400)
Platelets: 211 10*3/uL (ref 150–400)

## 2020-08-03 LAB — HEMOGLOBIN AND HEMATOCRIT, BLOOD
HCT: 25.1 % — ABNORMAL LOW (ref 39.0–52.0)
HCT: 26.2 % — ABNORMAL LOW (ref 39.0–52.0)
Hemoglobin: 8.3 g/dL — ABNORMAL LOW (ref 13.0–17.0)
Hemoglobin: 8.8 g/dL — ABNORMAL LOW (ref 13.0–17.0)

## 2020-08-03 SURGERY — BENTALL PROCEDURE
Anesthesia: General

## 2020-08-03 MED ORDER — SODIUM CHLORIDE 0.9% FLUSH: 3.0000 mL | INTRAVENOUS | Status: DC | PRN

## 2020-08-03 MED ORDER — LACTATED RINGERS IV SOLN: 500.0000 mL | Freq: Once | INTRAVENOUS | Status: DC | PRN

## 2020-08-03 MED ORDER — PHENYLEPHRINE HCL (PRESSORS) 10 MG/ML IV SOLN
INTRAVENOUS | Status: AC
  Filled 2020-08-03: qty 1

## 2020-08-03 MED ORDER — ROCURONIUM BROMIDE 10 MG/ML (PF) SYRINGE
PREFILLED_SYRINGE | INTRAVENOUS | Status: AC
  Filled 2020-08-03: qty 10

## 2020-08-03 MED ORDER — VASOPRESSIN 20 UNITS/100 ML INFUSION FOR SHOCK
0.0000 [IU]/min | INTRAVENOUS | Status: DC
  Administered 2020-08-03: 0.04 [IU]/min via INTRAVENOUS

## 2020-08-03 MED ORDER — HEMOSTATIC AGENTS (NO CHARGE) OPTIME
TOPICAL | Status: DC | PRN
  Administered 2020-08-03: 1 via TOPICAL

## 2020-08-03 MED ORDER — EPHEDRINE SULFATE-NACL 50-0.9 MG/10ML-% IV SOSY
PREFILLED_SYRINGE | INTRAVENOUS | Status: DC | PRN
  Administered 2020-08-03: 10 mg via INTRAVENOUS
  Administered 2020-08-03: 5 mg via INTRAVENOUS

## 2020-08-03 MED ORDER — SODIUM CHLORIDE 0.9% FLUSH: 3.0000 mL | Freq: Two times a day (BID) | INTRAVENOUS | Status: DC

## 2020-08-03 MED ORDER — ASPIRIN 81 MG PO CHEW: 324.0000 mg | CHEWABLE_TABLET | Freq: Every day | ORAL | Status: DC

## 2020-08-03 MED ORDER — MIDAZOLAM HCL (PF) 5 MG/ML IJ SOLN
INTRAMUSCULAR | Status: DC | PRN
  Administered 2020-08-03: 3 mg via INTRAVENOUS
  Administered 2020-08-03: 2 mg via INTRAVENOUS
  Administered 2020-08-03: 1 mg via INTRAVENOUS
  Administered 2020-08-03 (×2): 2 mg via INTRAVENOUS

## 2020-08-03 MED ORDER — OXYCODONE HCL 5 MG PO TABS
5.0000 mg | ORAL_TABLET | ORAL | Status: DC | PRN
  Administered 2020-08-04: 5 mg via ORAL
  Administered 2020-08-06 – 2020-08-07 (×4): 10 mg via ORAL
  Filled 2020-08-03 (×2): qty 2
  Filled 2020-08-03: qty 1
  Filled 2020-08-03: qty 2

## 2020-08-03 MED ORDER — VASOPRESSIN 20 UNITS/100 ML INFUSION FOR SHOCK
INTRAVENOUS | Status: DC | PRN
  Administered 2020-08-03: .03 [IU]/min via INTRAVENOUS

## 2020-08-03 MED ORDER — DOCUSATE SODIUM 100 MG PO CAPS
200.0000 mg | ORAL_CAPSULE | Freq: Every day | ORAL | Status: DC
  Administered 2020-08-04 – 2020-08-05 (×2): 200 mg via ORAL
  Filled 2020-08-03 (×2): qty 2

## 2020-08-03 MED ORDER — PROTAMINE SULFATE 10 MG/ML IV SOLN
INTRAVENOUS | Status: AC
  Filled 2020-08-03: qty 5

## 2020-08-03 MED ORDER — ACETAMINOPHEN 650 MG RE SUPP
650.0000 mg | Freq: Once | RECTAL | Status: AC
  Administered 2020-08-03: 650 mg via RECTAL

## 2020-08-03 MED ORDER — PRISMASOL BGK 0/2.5 32-2.5 MEQ/L EC SOLN
Status: DC
  Filled 2020-08-03 (×3): qty 5000

## 2020-08-03 MED ORDER — MORPHINE SULFATE (PF) 2 MG/ML IV SOLN
1.0000 mg | INTRAVENOUS | Status: DC | PRN
  Administered 2020-08-03: 2 mg via INTRAVENOUS
  Administered 2020-08-03: 4 mg via INTRAVENOUS
  Filled 2020-08-03: qty 1
  Filled 2020-08-03: qty 2

## 2020-08-03 MED ORDER — METOPROLOL TARTRATE 25 MG/10 ML ORAL SUSPENSION
12.5000 mg | Freq: Two times a day (BID) | ORAL | Status: DC
  Filled 2020-08-03 (×3): qty 5

## 2020-08-03 MED ORDER — ASPIRIN EC 325 MG PO TBEC
325.0000 mg | DELAYED_RELEASE_TABLET | Freq: Every day | ORAL | Status: DC
  Administered 2020-08-04 – 2020-08-06 (×3): 325 mg via ORAL
  Filled 2020-08-03 (×3): qty 1

## 2020-08-03 MED ORDER — SODIUM CHLORIDE 0.9 % IV SOLN
0.5000 ug/kg/min | INTRAVENOUS | Status: AC
  Administered 2020-08-03: 5 ug/kg/min via INTRAVENOUS
  Filled 2020-08-03: qty 20

## 2020-08-03 MED ORDER — CHLORHEXIDINE GLUCONATE 0.12 % MT SOLN
15.0000 mL | OROMUCOSAL | Status: AC
  Administered 2020-08-03: 15 mL via OROMUCOSAL

## 2020-08-03 MED ORDER — INSULIN REGULAR(HUMAN) IN NACL 100-0.9 UT/100ML-% IV SOLN
INTRAVENOUS | Status: DC | PRN
  Administered 2020-08-03: 1.5 [IU]/h via INTRAVENOUS

## 2020-08-03 MED ORDER — LIDOCAINE 5 % EX PTCH
2.0000 | MEDICATED_PATCH | CUTANEOUS | Status: DC
  Administered 2020-08-06 (×2): 2 via TRANSDERMAL
  Filled 2020-08-03 (×5): qty 2

## 2020-08-03 MED ORDER — ONDANSETRON HCL 4 MG/2ML IJ SOLN
INTRAMUSCULAR | Status: AC
  Filled 2020-08-03: qty 2

## 2020-08-03 MED ORDER — PROPOFOL 10 MG/ML IV BOLUS
INTRAVENOUS | Status: DC | PRN
  Administered 2020-08-03 (×2): 30 mg via INTRAVENOUS
  Administered 2020-08-03: 60 mg via INTRAVENOUS

## 2020-08-03 MED ORDER — PLASMA-LYTE 148 IV SOLN
INTRAVENOUS | Status: DC | PRN
  Administered 2020-08-03: 500 mL via INTRAVASCULAR

## 2020-08-03 MED ORDER — TRANEXAMIC ACID 1000 MG/10ML IV SOLN: INTRAVENOUS | Status: DC | PRN

## 2020-08-03 MED ORDER — HEPARIN SODIUM (PORCINE) 1000 UNIT/ML IJ SOLN
INTRAMUSCULAR | Status: DC | PRN
  Administered 2020-08-03: 40000 [IU] via INTRAVENOUS

## 2020-08-03 MED ORDER — SODIUM CHLORIDE 0.9 % IV SOLN
INTRAVENOUS | Status: DC | PRN
  Administered 2020-08-04: 10 mL/h via INTRAVENOUS

## 2020-08-03 MED ORDER — PRISMASOL BGK 0/2.5 32-2.5 MEQ/L EC SOLN
Status: DC
  Filled 2020-08-03 (×25): qty 5000

## 2020-08-03 MED ORDER — MIDAZOLAM HCL (PF) 10 MG/2ML IJ SOLN
INTRAMUSCULAR | Status: AC
  Filled 2020-08-03: qty 2

## 2020-08-03 MED ORDER — SODIUM CHLORIDE 0.9 % IV SOLN: 250.0000 mL | INTRAVENOUS | Status: DC

## 2020-08-03 MED ORDER — BISACODYL 5 MG PO TBEC
10.0000 mg | DELAYED_RELEASE_TABLET | Freq: Every day | ORAL | Status: DC
  Administered 2020-08-04 – 2020-08-05 (×2): 10 mg via ORAL
  Filled 2020-08-03 (×2): qty 2

## 2020-08-03 MED ORDER — CEFAZOLIN SODIUM-DEXTROSE 2-4 GM/100ML-% IV SOLN
2.0000 g | Freq: Three times a day (TID) | INTRAVENOUS | Status: DC
  Administered 2020-08-03 – 2020-08-05 (×4): 2 g via INTRAVENOUS
  Filled 2020-08-03 (×7): qty 100

## 2020-08-03 MED ORDER — ARTIFICIAL TEARS OPHTHALMIC OINT
TOPICAL_OINTMENT | OPHTHALMIC | Status: DC | PRN
  Administered 2020-08-03: 1 via OPHTHALMIC

## 2020-08-03 MED ORDER — PANTOPRAZOLE SODIUM 40 MG PO TBEC: 40.0000 mg | DELAYED_RELEASE_TABLET | Freq: Every day | ORAL | Status: DC

## 2020-08-03 MED ORDER — ALBUMIN HUMAN 5 % IV SOLN
250.0000 mL | INTRAVENOUS | Status: AC | PRN
  Administered 2020-08-03 (×3): 12.5 g via INTRAVENOUS
  Filled 2020-08-03: qty 250

## 2020-08-03 MED ORDER — INSULIN REGULAR(HUMAN) IN NACL 100-0.9 UT/100ML-% IV SOLN: INTRAVENOUS | Status: DC

## 2020-08-03 MED ORDER — ONDANSETRON HCL 4 MG/2ML IJ SOLN
4.0000 mg | Freq: Four times a day (QID) | INTRAMUSCULAR | Status: DC | PRN
  Administered 2020-08-03: 4 mg via INTRAVENOUS
  Filled 2020-08-03: qty 2

## 2020-08-03 MED ORDER — EPHEDRINE 5 MG/ML INJ
INTRAVENOUS | Status: AC
  Filled 2020-08-03: qty 10

## 2020-08-03 MED ORDER — POTASSIUM CHLORIDE 10 MEQ/50ML IV SOLN: 10.0000 meq | INTRAVENOUS | Status: AC

## 2020-08-03 MED ORDER — TRANEXAMIC ACID (OHS) BOLUS VIA INFUSION
15.0000 mg/kg | INTRAVENOUS | Status: AC
  Administered 2020-08-03: 1470 mg via INTRAVENOUS
  Filled 2020-08-03: qty 1470

## 2020-08-03 MED ORDER — ACETAMINOPHEN 160 MG/5ML PO SOLN: 650.0000 mg | Freq: Once | ORAL | Status: AC

## 2020-08-03 MED ORDER — SODIUM BICARBONATE 8.4 % IV SOLN
50.0000 meq | Freq: Once | INTRAVENOUS | Status: AC
  Administered 2020-08-03: 50 meq via INTRAVENOUS

## 2020-08-03 MED ORDER — TRAMADOL HCL 50 MG PO TABS
50.0000 mg | ORAL_TABLET | ORAL | Status: DC | PRN
Start: 2020-08-03 — End: 2020-08-08
  Administered 2020-08-04: 100 mg via ORAL
  Administered 2020-08-04 – 2020-08-05 (×2): 50 mg via ORAL
  Administered 2020-08-05 – 2020-08-06 (×5): 100 mg via ORAL
  Filled 2020-08-03: qty 1
  Filled 2020-08-03 (×3): qty 2
  Filled 2020-08-03: qty 1
  Filled 2020-08-03 (×4): qty 2

## 2020-08-03 MED ORDER — PHENYLEPHRINE 40 MCG/ML (10ML) SYRINGE FOR IV PUSH (FOR BLOOD PRESSURE SUPPORT)
PREFILLED_SYRINGE | INTRAVENOUS | Status: AC
  Filled 2020-08-03: qty 10

## 2020-08-03 MED ORDER — FENTANYL CITRATE (PF) 250 MCG/5ML IJ SOLN
INTRAMUSCULAR | Status: AC
  Filled 2020-08-03: qty 25

## 2020-08-03 MED ORDER — LACTATED RINGERS IV SOLN: INTRAVENOUS | Status: DC

## 2020-08-03 MED ORDER — FENTANYL CITRATE (PF) 100 MCG/2ML IJ SOLN
INTRAMUSCULAR | Status: DC | PRN
  Administered 2020-08-03 (×2): 50 ug via INTRAVENOUS
  Administered 2020-08-03 (×2): 100 ug via INTRAVENOUS
  Administered 2020-08-03: 150 ug via INTRAVENOUS
  Administered 2020-08-03 (×2): 100 ug via INTRAVENOUS
  Administered 2020-08-03 (×2): 50 ug via INTRAVENOUS
  Administered 2020-08-03: 100 ug via INTRAVENOUS
  Administered 2020-08-03: 50 ug via INTRAVENOUS
  Administered 2020-08-03: 100 ug via INTRAVENOUS
  Administered 2020-08-03: 150 ug via INTRAVENOUS
  Administered 2020-08-03: 100 ug via INTRAVENOUS

## 2020-08-03 MED ORDER — ROCURONIUM BROMIDE 10 MG/ML (PF) SYRINGE
PREFILLED_SYRINGE | INTRAVENOUS | Status: DC | PRN
  Administered 2020-08-03: 30 mg via INTRAVENOUS
  Administered 2020-08-03: 20 mg via INTRAVENOUS
  Administered 2020-08-03: 100 mg via INTRAVENOUS
  Administered 2020-08-03: 50 mg via INTRAVENOUS

## 2020-08-03 MED ORDER — SODIUM CHLORIDE 0.9 % IV SOLN: INTRAVENOUS | Status: DC | PRN

## 2020-08-03 MED ORDER — VASOPRESSIN 20 UNITS/100 ML INFUSION FOR SHOCK
0.0000 [IU]/min | INTRAVENOUS | Status: DC
Start: 2020-08-03 — End: 2020-08-03
  Filled 2020-08-03: qty 100

## 2020-08-03 MED ORDER — NOREPINEPHRINE 4 MG/250ML-% IV SOLN
0.0000 ug/min | INTRAVENOUS | Status: DC
  Administered 2020-08-03: 25 ug/min via INTRAVENOUS
  Administered 2020-08-03: 15 ug/min via INTRAVENOUS
  Administered 2020-08-04: 25 ug/min via INTRAVENOUS
  Administered 2020-08-04: 10 ug/min via INTRAVENOUS
  Administered 2020-08-04: 15 ug/min via INTRAVENOUS
  Filled 2020-08-03 (×3): qty 250
  Filled 2020-08-03: qty 500

## 2020-08-03 MED ORDER — FAMOTIDINE IN NACL 20-0.9 MG/50ML-% IV SOLN
20.0000 mg | Freq: Two times a day (BID) | INTRAVENOUS | Status: DC
  Administered 2020-08-03: 20 mg via INTRAVENOUS
  Filled 2020-08-03 (×2): qty 50

## 2020-08-03 MED ORDER — TRANEXAMIC ACID 1000 MG/10ML IV SOLN
INTRAVENOUS | Status: DC | PRN
  Administered 2020-08-03: 1.5 mg/kg/h via INTRAVENOUS

## 2020-08-03 MED ORDER — BISACODYL 10 MG RE SUPP: 10.0000 mg | Freq: Every day | RECTAL | Status: DC

## 2020-08-03 MED ORDER — SODIUM CHLORIDE 0.45 % IV SOLN: INTRAVENOUS | Status: DC | PRN

## 2020-08-03 MED ORDER — MIDAZOLAM HCL 2 MG/2ML IJ SOLN
2.0000 mg | INTRAMUSCULAR | Status: DC | PRN
  Administered 2020-08-03: 2 mg via INTRAVENOUS
  Filled 2020-08-03: qty 2

## 2020-08-03 MED ORDER — MAGNESIUM SULFATE 4 GM/100ML IV SOLN: 4.0000 g | Freq: Once | INTRAVENOUS | Status: DC

## 2020-08-03 MED ORDER — NITROGLYCERIN IN D5W 200-5 MCG/ML-% IV SOLN: 0.0000 ug/min | INTRAVENOUS | Status: DC

## 2020-08-03 MED ORDER — METOPROLOL TARTRATE 12.5 MG HALF TABLET
12.5000 mg | ORAL_TABLET | Freq: Two times a day (BID) | ORAL | Status: DC
  Administered 2020-08-04 – 2020-08-06 (×5): 12.5 mg via ORAL
  Filled 2020-08-03 (×7): qty 1

## 2020-08-03 MED ORDER — ACETAMINOPHEN 500 MG PO TABS
1000.0000 mg | ORAL_TABLET | Freq: Four times a day (QID) | ORAL | Status: DC
  Administered 2020-08-03 – 2020-08-07 (×14): 1000 mg via ORAL
  Filled 2020-08-03 (×14): qty 2

## 2020-08-03 MED ORDER — PROTAMINE SULFATE 10 MG/ML IV SOLN
INTRAVENOUS | Status: DC | PRN
  Administered 2020-08-03: 400 mg via INTRAVENOUS

## 2020-08-03 MED ORDER — TRANEXAMIC ACID 1000 MG/10ML IV SOLN
1.5000 mg/kg/h | INTRAVENOUS | Status: DC
  Filled 2020-08-03: qty 25

## 2020-08-03 MED ORDER — LACTATED RINGERS IV SOLN: INTRAVENOUS | Status: DC | PRN

## 2020-08-03 MED ORDER — PROTAMINE SULFATE 10 MG/ML IV SOLN
INTRAVENOUS | Status: AC
  Filled 2020-08-03: qty 50

## 2020-08-03 MED ORDER — PRISMASOL BGK 0/2.5 32-2.5 MEQ/L EC SOLN
Status: DC
  Filled 2020-08-03 (×4): qty 5000

## 2020-08-03 MED ORDER — GELATIN ABSORBABLE MT POWD
OROMUCOSAL | Status: DC | PRN
  Administered 2020-08-03 (×3): 4 mL via TOPICAL

## 2020-08-03 MED ORDER — NOREPINEPHRINE 4 MG/250ML-% IV SOLN
INTRAVENOUS | Status: DC | PRN
  Administered 2020-08-03: 4 ug/min via INTRAVENOUS

## 2020-08-03 MED ORDER — HEPARIN SODIUM (PORCINE) 1000 UNIT/ML IJ SOLN
INTRAMUSCULAR | Status: AC
  Filled 2020-08-03: qty 1

## 2020-08-03 MED ORDER — CEFAZOLIN SODIUM-DEXTROSE 2-3 GM-%(50ML) IV SOLR
INTRAVENOUS | Status: DC | PRN
  Administered 2020-08-03 (×2): 2 g via INTRAVENOUS

## 2020-08-03 MED ORDER — PHENYLEPHRINE HCL-NACL 20-0.9 MG/250ML-% IV SOLN
INTRAVENOUS | Status: DC | PRN
  Administered 2020-08-03: 15 ug/min via INTRAVENOUS

## 2020-08-03 MED ORDER — DOBUTAMINE IN D5W 4-5 MG/ML-% IV SOLN
2.5000 ug/kg/min | INTRAVENOUS | Status: DC
Start: 2020-08-03 — End: 2020-08-06

## 2020-08-03 MED ORDER — DEXMEDETOMIDINE HCL IN NACL 400 MCG/100ML IV SOLN
INTRAVENOUS | Status: DC | PRN
  Administered 2020-08-03: .4 ug/kg/h via INTRAVENOUS

## 2020-08-03 MED ORDER — ALBUMIN HUMAN 5 % IV SOLN
25.0000 g | Freq: Once | INTRAVENOUS | Status: AC
  Administered 2020-08-03: 25 g via INTRAVENOUS

## 2020-08-03 MED ORDER — OXYCODONE HCL 5 MG PO TABS
10.0000 mg | ORAL_TABLET | Freq: Once | ORAL | Status: AC
  Administered 2020-08-06: 10 mg via ORAL
  Filled 2020-08-03 (×2): qty 2

## 2020-08-03 MED ORDER — VANCOMYCIN HCL 1000 MG IV SOLR
INTRAVENOUS | Status: DC | PRN
  Administered 2020-08-03: 1500 mg via INTRAVENOUS

## 2020-08-03 MED ORDER — VANCOMYCIN HCL IN DEXTROSE 1-5 GM/200ML-% IV SOLN
1000.0000 mg | Freq: Once | INTRAVENOUS | Status: AC
  Administered 2020-08-03: 1000 mg via INTRAVENOUS
  Filled 2020-08-03: qty 200

## 2020-08-03 MED ORDER — PROPOFOL 10 MG/ML IV BOLUS
INTRAVENOUS | Status: AC
  Filled 2020-08-03: qty 20

## 2020-08-03 MED ORDER — PHENYLEPHRINE HCL-NACL 20-0.9 MG/250ML-% IV SOLN: 0.0000 ug/min | INTRAVENOUS | Status: DC

## 2020-08-03 MED ORDER — METHYLPREDNISOLONE SODIUM SUCC 125 MG IJ SOLR
INTRAMUSCULAR | Status: AC
  Filled 2020-08-03: qty 2

## 2020-08-03 MED ORDER — DEXMEDETOMIDINE HCL IN NACL 400 MCG/100ML IV SOLN
0.0000 ug/kg/h | INTRAVENOUS | Status: DC
  Administered 2020-08-04: 0.3 ug/kg/h via INTRAVENOUS
  Filled 2020-08-03: qty 100

## 2020-08-03 MED ORDER — METOPROLOL TARTRATE 5 MG/5ML IV SOLN
2.5000 mg | INTRAVENOUS | Status: DC | PRN
Start: 2020-08-03 — End: 2020-08-08

## 2020-08-03 MED ORDER — ACETAMINOPHEN 160 MG/5ML PO SOLN: 1000.0000 mg | Freq: Four times a day (QID) | ORAL | Status: DC

## 2020-08-03 MED ORDER — DEXTROSE 50 % IV SOLN: 0.0000 mL | INTRAVENOUS | Status: DC | PRN

## 2020-08-03 MED ORDER — ALBUMIN HUMAN 5 % IV SOLN: INTRAVENOUS | Status: DC | PRN

## 2020-08-03 MED ORDER — SODIUM CHLORIDE 0.9 % IV SOLN: INTRAVENOUS | Status: DC

## 2020-08-03 SURGICAL SUPPLY — 101 items
ADAPTER CARDIO PERF ANTE/RETRO (ADAPTER) ×4 IMPLANT
ADPR PRFSN 84XANTGRD RTRGD (ADAPTER) ×2
APL SWBSTK 6 STRL LF DISP (MISCELLANEOUS) ×1
APPLICATOR COTTON TIP 6 STRL (MISCELLANEOUS) IMPLANT
APPLICATOR COTTON TIP 6IN STRL (MISCELLANEOUS) ×2
BAG DECANTER FOR FLEXI CONT (MISCELLANEOUS) ×2 IMPLANT
BLADE CLIPPER SURG (BLADE) ×2 IMPLANT
BLADE STERNUM SYSTEM 6 (BLADE) ×2 IMPLANT
BLADE SURG 15 STRL LF DISP TIS (BLADE) ×1 IMPLANT
BLADE SURG 15 STRL SS (BLADE) ×2
BLOOD HAEMOCONCENTR 700 MIDI (MISCELLANEOUS) ×2 IMPLANT
CANISTER SUCT 3000ML PPV (MISCELLANEOUS) ×2 IMPLANT
CANNULA GUNDRY RCSP 15FR (MISCELLANEOUS) ×2 IMPLANT
CANNULA MC2 2 STG 29/37 NON-V (CANNULA) IMPLANT
CANNULA MC2 TWO STAGE (CANNULA) ×2
CANNULA NON VENT 20FR 12 (CANNULA) ×2 IMPLANT
CANNULA SUMP PERICARDIAL (CANNULA) ×1 IMPLANT
CATH CPB KIT HENDRICKSON (MISCELLANEOUS) ×2 IMPLANT
CATH HEART VENT LEFT (CATHETERS) ×1 IMPLANT
CATH ROBINSON RED A/P 18FR (CATHETERS) ×8 IMPLANT
CATH/SQUID NICHOLS JEHLE COR (CATHETERS) ×1 IMPLANT
CNTNR URN SCR LID CUP LEK RST (MISCELLANEOUS) ×1 IMPLANT
CONN ST 1/2X1/2  BEN (MISCELLANEOUS) ×2
CONN ST 1/2X1/2 BEN (MISCELLANEOUS) ×1 IMPLANT
CONN ST 1/4X3/8  BEN (MISCELLANEOUS) ×4
CONN ST 1/4X3/8 BEN (MISCELLANEOUS) ×2 IMPLANT
CONT SPEC 4OZ STRL OR WHT (MISCELLANEOUS) ×6
CONTAINER PROTECT SURGISLUSH (MISCELLANEOUS) ×4 IMPLANT
COVER BACK TABLE 60X90IN (DRAPES) ×1 IMPLANT
COVER SURGICAL LIGHT HANDLE (MISCELLANEOUS) ×2 IMPLANT
DEVICE SUT CK QUICK LOAD MINI (Prosthesis & Implant Heart) ×1 IMPLANT
DRAIN CHANNEL 19F RND (DRAIN) ×2 IMPLANT
DRAIN CHANNEL 28F RND 3/8 FF (WOUND CARE) ×4 IMPLANT
DRAPE CARDIOVASCULAR INCISE (DRAPES) ×2
DRAPE SRG 135X102X78XABS (DRAPES) ×1 IMPLANT
DRAPE WARM FLUID 44X44 (DRAPES) ×2 IMPLANT
DRSG AQUACEL AG ADV 3.5X14 (GAUZE/BANDAGES/DRESSINGS) ×1 IMPLANT
DRSG COVADERM 4X14 (GAUZE/BANDAGES/DRESSINGS) ×2 IMPLANT
ELECT BLADE 4.0 EZ CLEAN MEGAD (MISCELLANEOUS) ×2
ELECT CAUTERY BLADE 6.4 (BLADE) ×2 IMPLANT
ELECT REM PT RETURN 9FT ADLT (ELECTROSURGICAL) ×4
ELECTRODE BLDE 4.0 EZ CLN MEGD (MISCELLANEOUS) IMPLANT
ELECTRODE REM PT RTRN 9FT ADLT (ELECTROSURGICAL) ×2 IMPLANT
FELT TEFLON 1X6 (MISCELLANEOUS) ×5 IMPLANT
GAUZE SPONGE 4X4 12PLY STRL (GAUZE/BANDAGES/DRESSINGS) ×2 IMPLANT
GAUZE SPONGE 4X4 12PLY STRL LF (GAUZE/BANDAGES/DRESSINGS) ×1 IMPLANT
GLOVE BIO SURGEON STRL SZ 6 (GLOVE) IMPLANT
GLOVE BIO SURGEON STRL SZ 6.5 (GLOVE) IMPLANT
GLOVE BIO SURGEON STRL SZ7 (GLOVE) ×6 IMPLANT
GLOVE BIO SURGEON STRL SZ7.5 (GLOVE) IMPLANT
GLOVE EUDERMIC 7 POWDERFREE (GLOVE) ×4 IMPLANT
GLOVE SURG MICRO LTX SZ7 (GLOVE) ×1 IMPLANT
GOWN STRL REIN XL LVL4 (GOWNS) ×4 IMPLANT
GOWN STRL REUS W/ TWL LRG LVL3 (GOWN DISPOSABLE) ×4 IMPLANT
GOWN STRL REUS W/ TWL XL LVL3 (GOWN DISPOSABLE) ×2 IMPLANT
GOWN STRL REUS W/TWL LRG LVL3 (GOWN DISPOSABLE) ×20
GOWN STRL REUS W/TWL XL LVL3 (GOWN DISPOSABLE) ×4
HEMOSTAT POWDER SURGIFOAM 1G (HEMOSTASIS) ×6 IMPLANT
HEMOSTAT SURGICEL 2X14 (HEMOSTASIS) ×2 IMPLANT
INSERT FOGARTY XLG (MISCELLANEOUS) ×1 IMPLANT
INSERT SUTURE HOLDER (MISCELLANEOUS) ×3 IMPLANT
KIT BASIN OR (CUSTOM PROCEDURE TRAY) ×2 IMPLANT
KIT DRAINAGE VACCUM ASSIST (KITS) ×1 IMPLANT
KIT SUCTION CATH 14FR (SUCTIONS) ×2 IMPLANT
KIT SUT CK MINI COMBO 4X17 (Prosthesis & Implant Heart) ×1 IMPLANT
KIT TURNOVER KIT B (KITS) ×2 IMPLANT
LEAD PACING MYOCARDI (MISCELLANEOUS) ×1 IMPLANT
LINE VENT (MISCELLANEOUS) ×1 IMPLANT
NS IRRIG 1000ML POUR BTL (IV SOLUTION) ×12 IMPLANT
PACK E OPEN HEART (SUTURE) ×2 IMPLANT
PACK OPEN HEART (CUSTOM PROCEDURE TRAY) ×2 IMPLANT
PAD ARMBOARD 7.5X6 YLW CONV (MISCELLANEOUS) ×4 IMPLANT
POSITIONER HEAD DONUT 9IN (MISCELLANEOUS) ×2 IMPLANT
SET MPS 3-ND DEL (MISCELLANEOUS) ×1 IMPLANT
SIZER VASCULAR GRAFT LG 24-38 (SIZER) ×1 IMPLANT
STRIP PERIGUARD 4X4 (Vascular Products) ×1 IMPLANT
SUT BONE WAX W31G (SUTURE) ×2 IMPLANT
SUT EB EXC GRN/WHT 2-0 V-5 (SUTURE) ×6 IMPLANT
SUT ETHIBOND 2 0 SH (SUTURE) ×2
SUT ETHIBOND 2 0 SH 36X2 (SUTURE) IMPLANT
SUT ETHIBOND 2 0 V5 (SUTURE) ×4 IMPLANT
SUT PROLENE 3 0 SH DA (SUTURE) IMPLANT
SUT PROLENE 3 0 SH1 36 (SUTURE) ×2 IMPLANT
SUT PROLENE 4 0 RB 1 (SUTURE) ×18
SUT PROLENE 4 0 SH DA (SUTURE) ×11 IMPLANT
SUT PROLENE 4-0 RB1 .5 CRCL 36 (SUTURE) ×3 IMPLANT
SUT PROLENE 5 0 C 1 36 (SUTURE) ×2 IMPLANT
SUT STEEL 6MS V (SUTURE) IMPLANT
SUT STEEL STERNAL CCS#1 18IN (SUTURE) ×3 IMPLANT
SUT VIC AB 1 CTX 36 (SUTURE) ×2
SUT VIC AB 1 CTX36XBRD ANBCTR (SUTURE) IMPLANT
SYR BULB IRRIG 60ML STRL (SYRINGE) ×2 IMPLANT
SYSTEM SAHARA CHEST DRAIN ATS (WOUND CARE) ×2 IMPLANT
TAPE CLOTH SURG 4X10 WHT LF (GAUZE/BANDAGES/DRESSINGS) ×1 IMPLANT
TOWEL GREEN STERILE (TOWEL DISPOSABLE) ×2 IMPLANT
TOWEL GREEN STERILE FF (TOWEL DISPOSABLE) ×2 IMPLANT
TRAY FOLEY SLVR 16FR TEMP STAT (SET/KITS/TRAYS/PACK) ×2 IMPLANT
UNDERPAD 30X36 HEAVY ABSORB (UNDERPADS AND DIAPERS) ×2 IMPLANT
VALVE ON-X AORTIC 23MM (Prosthesis & Implant Heart) ×1 IMPLANT
VENT LEFT HEART 12002 (CATHETERS) ×4
WATER STERILE IRR 1000ML POUR (IV SOLUTION) ×4 IMPLANT

## 2020-08-03 NOTE — Progress Notes (Signed)
Dr. Lynetta Mare aware of following ABG results. Stated to extubate per CCM protocol. Stated CRRT will correct K 6.4 and bicarb levels.    Results for Alex [name], Alex [name] (MRN [id number]) as of 08/03/2020 19:52  Ref. Range 08/03/2020 18:34  Sample type Unknown ARTERIAL  pH, Arterial Latest Ref Range: 7.350 - 7.450  7.392  pCO2 arterial Latest Ref Range: 32.0 - 48.0 mmHg 30.2 (L)  pO2, Arterial Latest Ref Range: 83.0 - 108.0 mmHg 122 (H)  TCO2 Latest Ref Range: 22 - 32 mmol/L 19 (L)  Acid-base deficit Latest Ref Range: 0.0 - 2.0 mmol/L 6.0 (H)  Bicarbonate Latest Ref Range: 20.0 - 28.0 mmol/L 18.2 (L)  O2 Saturation Latest Units: % 99.0  Patient temperature Unknown 38.1 C  Sodium Latest Ref Range: 135 - 145 mmol/L 137  Potassium Latest Ref Range: 3.5 - 5.1 mmol/L 6.4 (HH)  Calcium Ionized Latest Ref Range: 1.15 - 1.40 mmol/L 1.02 (L)  Hemoglobin Latest Ref Range: 13.0 - 17.0 g/dL 8.5 (L)  HCT Latest Ref Range: 39.0 - 52.0 % 25.0 (L)

## 2020-08-03 NOTE — Procedures (Signed)
Extubation Procedure Note  Patient Details:   Name: ZACCHEAUS CARTEE DOB: 01/25/1981 MRN: [id number]   Airway Documentation:    Vent end date: 08/03/20 Vent end time: 1845   Evaluation  O2 sats: stable throughout Complications: No apparent complications Patient did tolerate procedure well. Bilateral Breath Sounds: Clear,Diminished   Yes   Pt was extubated per CCM order and placed on 3 L [place]. Pt is stable at this time and is in no distress. Cuff leak was noted prior to extubation and no stridor post. RT will monitor.  Ronaldo Miyamoto 08/03/2020, 6:47 PM

## 2020-08-03 NOTE — Progress Notes (Addendum)
Admit: 07/18/2020 LOS: 14  41M AV IE and perivalvular abscess, serrata sp.  For AVR repair this week;  Anuric AKI from ATN-  Normal crt in Feb 2022 then 5 on 07/18/20 in the setting of IE  Current CRRT Prescription: Start Date: Restarted 5/6, first RRT4/28 Catheter: R IJ Temp HC Cath placed 07/28/20 by CCM BFR: 200-250 Pre Blood Pump: 1000 4K DFR: 1500 4K Replacement Rate: 300 4K Goal UF: 70m/h net neg Anticoagulation: Fixed dose heparin 500 units an hour in the CRRT circuit Clotting: Improved  S:   S/p bentall procedure today. Just got back from OR, labs being drawn currently. Apparently plan is to restart CRRT tomorrow. On pressors & vent   O: 05/11 0701 - 05/12 0700 In: 1414.4 [P.O.:1250; I.V.:64.4; IV Piggyback:100] Out: 420    gen-  sedated Lungs-  On vent cv-  RRR abd-  Soft, nd Ext-  No edema   Filed Weights   08/01/20 0323 08/02/20 0349 08/03/20 0300  Weight: 102.6 kg 114.9 kg 114.3 kg    Recent Labs  Lab 08/02/20 0210 08/02/20 1550 08/03/20 0325 08/03/20 0828 08/03/20 1312 08/03/20 1319 08/03/20 1355 08/03/20 1426 08/03/20 1430  NA 131* 130* 131*   < > 133*   < > 133* 135 136  K 3.8 4.4 4.3   < > 6.2*   < > 6.6* 5.4* 5.4*  CL 99 99 99   < > 105  --  104 105  --   CO2 '22 22 24  '$ --   --   --   --   --   --   GLUCOSE 151* 119* 97   < > 179*  --  159* 136*  --   BUN 34* 43* 38*   < > 52*  --  55* 52*  --   CREATININE 3.19* 4.03* 3.86*   < > 3.90*  --  3.90* 3.70*  --   CALCIUM 7.9* 7.8* 8.0*  --   --   --   --   --   --   PHOS 2.8 3.9 3.3  --   --   --   --   --   --    < > = values in this interval not displayed.   Recent Labs  Lab 08/01/20 1606 08/02/20 0210 08/03/20 0325 08/03/20 0828 08/03/20 1125 08/03/20 1132 08/03/20 1305 08/03/20 1312 08/03/20 1355 08/03/20 1426 08/03/20 1430  WBC 9.4 8.8 8.8  --   --   --   --   --   --   --   --   HGB 6.5* 8.1* 7.2*   < > 8.3*   < > 8.8*   < > 8.2* 8.2* 8.8*  HCT 20.2* 25.3* 22.2*   < > 25.1*   <  > 26.2*   < > 24.0* 24.0* 26.0*  MCV 87.4 87.8 86.7  --   --   --   --   --   --   --   --   PLT 259 251 209  --  158  --  211  --   --   --   --    < > = values in this interval not displayed.    Scheduled Meds: . (feeding supplement) PROSource Plus  30 mL Oral BID BM  . [START ON 08/04/2020] acetaminophen  1,000 mg Oral Q6H   Or  . [START ON 08/04/2020] acetaminophen (TYLENOL) oral liquid 160 mg/5 mL  1,000 mg  Per Tube Q6H  . acetaminophen (TYLENOL) oral liquid 160 mg/5 mL  650 mg Per Tube Once   Or  . acetaminophen  650 mg Rectal Once  . [START ON 08/04/2020] aspirin EC  325 mg Oral Daily   Or  . [START ON 08/04/2020] aspirin  324 mg Per Tube Daily  . [START ON 08/04/2020] bisacodyl  10 mg Oral Daily   Or  . [START ON 08/04/2020] bisacodyl  10 mg Rectal Daily  . chlorhexidine  15 mL Mouth/Throat NOW  . Chlorhexidine Gluconate Cloth  6 each Topical Daily  . clonazePAM  0.5 mg Oral BID  . darbepoetin (ARANESP) injection - NON-DIALYSIS  200 mcg Subcutaneous Q Wed-1800  . [START ON 08/04/2020] docusate sodium  200 mg Oral Daily  . fenofibrate  160 mg Oral Daily  . melatonin  3 mg Oral QHS  . metoprolol tartrate  12.5 mg Oral BID   Or  . metoprolol tartrate  12.5 mg Per Tube BID  . [START ON 08/05/2020] pantoprazole  40 mg Oral Daily  . [START ON 08/04/2020] sodium chloride flush  3 mL Intravenous Q12H   Continuous Infusions: .  prismasol BGK 4/2.5 500 mL/hr at 08/03/20 0425  .  prismasol BGK 4/2.5 300 mL/hr at 08/02/20 0313  . sodium chloride    . [START ON 08/04/2020] sodium chloride    . sodium chloride    . albumin human    .  ceFAZolin (ANCEF) IV    . ceFEPime (MAXIPIME) IV    . dexmedetomidine (PRECEDEX) IV infusion    . DOBUTamine    . famotidine (PEPCID) IV    . insulin    . lactated ringers    . lactated ringers    . lactated ringers    . magnesium sulfate    . nitroGLYCERIN    . norepinephrine (LEVOPHED) Adult infusion    . phenylephrine (NEO-SYNEPHRINE) Adult  infusion    . potassium chloride    . prismasol BGK 4/2.5 1,500 mL/hr at 08/03/20 0314  . vancomycin     PRN Meds:.sodium chloride, albumin human, dextrose, heparin, heparin, lactated ringers, metoprolol tartrate, midazolam, morphine injection, ondansetron (ZOFRAN) IV, oxyCODONE, QUEtiapine, sodium chloride, [START ON 08/04/2020] sodium chloride flush, traMADol  ABG    Component Value Date/Time   PHART 7.310 (L) 08/03/2020 1430   PCO2ART 41.3 08/03/2020 1430   PO2ART 176 (H) 08/03/2020 1430   HCO3 20.8 08/03/2020 1430   TCO2 22 08/03/2020 1430   ACIDBASEDEF 5.0 (H) 08/03/2020 1430   O2SAT 99.0 08/03/2020 1430    A/P  1. 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  oligoanuric right now which is not a good sign.  Plan is to repeat labs now, if K is back up then will restart crrt which would be ideal. k 6.6 intraop-improved to 5.4 on most recent blood gas. Coordinated with icu staff 2. Hyperkalemia 07/28/2020, severe, improved-  changed back to 4 k bath.  Has had intermittent issues with hyperK, labs being drawn now 3. 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  WBC down 4. VDRF, resolved, normal intermittently on BiPAP 5. Anemia, transfusion per CCM-  On aranesp qwed 6. Shock: currently on levo and vaso, pressors per primary service   [facility]

## 2020-08-03 NOTE — Anesthesia Procedure Notes (Signed)
Central Venous Catheter Insertion Performed by: Annye Asa, MD, anesthesiologist Start/End5/02/2021 6:32 AM, 08/03/2020 6:54 AM Patient location: Pre-op. Preanesthetic checklist: patient identified, IV checked, risks and benefits discussed, surgical consent, monitors and equipment checked, pre-op evaluation, timeout performed and anesthesia consent Position: Trendelenburg Lidocaine 1% used for infiltration and patient sedated Hand hygiene performed , maximum sterile barriers used  and Seldinger technique used Catheter size: 9 Fr PA cath was placed.MAC introducer Swan type:thermodilution Procedure performed using ultrasound guided technique. Ultrasound Notes:anatomy identified, needle tip was noted to be adjacent to the nerve/plexus identified, no ultrasound evidence of intravascular and/or intraneural injection and image(s) printed for medical record Attempts: 1 Following insertion, line sutured, dressing applied and Biopatch. Post procedure assessment: no air, free fluid flow and blood return through all ports  Patient tolerated the procedure well with no immediate complications. Additional procedure comments: PA catheter:  Routine monitors. Timeout, sterile prep, drape, FBP L neck.  Trendelenburg position.  1% Lido local, finder and trocar LIJ 1st pass with US guidance.  MAC introducer placed over J wire. PA catheter in easily.  Sterile dressing applied.  Patient tolerated well, VSS.  Jenita Seashore, MD.

## 2020-08-03 NOTE — Progress Notes (Signed)
     [address]       [place] [address]             [phone]       No events  Vitals:   08/03/20 0500 08/03/20 0600  BP: (!) 113/54 (!) 111/46  Pulse: 100 (!) 104  Resp: (!) 26 16  Temp:    SpO2: 98% 100%   Sinus tach EWOB  OR today for Bentall procedure  Lajuana Matte

## 2020-08-03 NOTE — Anesthesia Preprocedure Evaluation (Signed)
Anesthesia Evaluation  Patient identified by MRN, date of birth, ID band Patient awake    Reviewed: Allergy & Precautions, NPO status , Patient's Chart, lab work & pertinent test results  History of Anesthesia Complications Negative for: history of anesthetic complications  Airway Mallampati: III  TM Distance: >3 FB Neck ROM: Full    Dental  (+) Dental Advisory Given   Pulmonary shortness of breath, neg COPD, neg recent URI, Current Smoker and Patient abstained from smoking.,  Covid-19 Nucleic Acid Test Results Lab Results      Component                Value               Date                      SARSCOV2NAA              NEGATIVE            07/18/2020              breath sounds clear to auscultation       Cardiovascular hypertension,  Rhythm:Regular  1. 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  2. 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 . The aortic valve is abnormal. Aortic valve  regurgitation is severe.  3. Left ventricular ejection fraction, by estimation, is 60 to 65%. The  left ventricle has normal function. The left ventricle has no regional  wall motion abnormalities.  4. Right ventricular systolic function is normal. The right ventricular  size is normal.  5. No left atrial/left atrial appendage thrombus was detected.  6. The mitral valve is normal in structure. No evidence of mitral valve  regurgitation.    Neuro/Psych CVA    GI/Hepatic (+)     substance abuse  methamphetamine use and IV drug use,   Endo/Other  negative endocrine ROS  Renal/GU ARF and DialysisRenal diseaseLab Results      Component                Value               Date                      CREATININE               3.86 (H)             08/03/2020                Musculoskeletal   Abdominal   Peds  Hematology  (+) Blood dyscrasia, anemia , Lab Results      Component                Value               Date                      WBC                      8.8                 08/03/2020  HGB                      7.2 (L)             08/03/2020                HCT                      22.2 (L)            08/03/2020                MCV                      86.7                08/03/2020                PLT                      209                 08/03/2020              Anesthesia Other Findings   Reproductive/Obstetrics                             Anesthesia Physical Anesthesia Plan  ASA: IV  Anesthesia Plan: General   Post-op Pain Management:    Induction: Intravenous  PONV Risk Score and Plan: 1 and Treatment may vary due to age or medical condition  Airway Management Planned: Oral ETT  Additional Equipment: Arterial line, CVP, PA Cath, Ultrasound Guidance Line Placement and TEE  Intra-op Plan:   Post-operative Plan: Post-operative intubation/ventilation  Informed Consent: I have reviewed the patients History and Physical, chart, labs and discussed the procedure including the risks, benefits and alternatives for the proposed anesthesia with the patient or authorized representative who has indicated his/her understanding and acceptance.     Dental advisory given  Plan Discussed with: CRNA and Surgeon  Anesthesia Plan Comments:         Anesthesia Quick Evaluation

## 2020-08-03 NOTE — Brief Op Note (Signed)
07/18/2020 - 08/03/2020  8:26 AM  PATIENT:  [name]  40 y.o. male  PRE-OPERATIVE DIAGNOSIS:  SEVERE AI AV ENDOCARDITIS  POST-OPERATIVE DIAGNOSIS:  SEVERE AI AV ENDOCARDITIS  PROCEDURE:  Procedure(s):  AORTIC VALVE REPLACEMENT -Ony-X 23 mm Mechanical Valve  TRANSESOPHAGEAL ECHOCARDIOGRAM (TEE) (N/A)  SURGEON:  Surgeon(s) and Role:    * Lightfoot, Lucile Crater, MD - Primary  PHYSICIAN ASSISTANT: Ellwood Handler PA-C, Enid Cutter PA-C  ASSISTANTS: Ardyth Man RNFA,    ANESTHESIA:   general  EBL:Per anesthesia record  BLOOD ADMINISTERED: 4 unit PRBC and  CC CELLSAVER  DRAINS: Mediastinal Chest Drains   LOCAL MEDICATIONS USED:  NONE  SPECIMEN:  Source of Specimen:  Aortic Valve Leaflets  DISPOSITION OF SPECIMEN:  PATHOLOGY  COUNTS:  YES  TOURNIQUET:  * No tourniquets in log *  DICTATION: .Dragon Dictation  PLAN OF CARE: patient has active admission order  PATIENT DISPOSITION:  ICU - intubated and hemodynamically stable.   Delay start of Pharmacological VTE agent (>24hrs) due to surgical blood loss or risk of bleeding: yes

## 2020-08-03 NOTE — Progress Notes (Signed)
NAME:  Alex [name], MRN:  [id number], DOB:  06-10-1980, LOS: 65 ADMISSION DATE:  07/18/2020, CONSULTATION DATE:  4/26 REFERRING MD:  Dr. Florina Ou, CHIEF COMPLAINT:  AMS   History of Present Illness:  40 yo male smoker brought to [facility] ER with altered mental status.  Noted to have headache and feeling feverish before admission.  Found to have hypotension, AKI, thrombocytopenia, splenomegaly, lactic acidosis, elevated bilirubin, hypoglycemia.  Started on antibiotics and pressors for possible sepsis, but no clear source of infection.  Normal renal function from February 2022.  Uses suboxone for history of substance abuse.  UDS positive for amphetamines.  Transfer to [facility] for further therapy.  Hx from chart, medical team, and pt's mother.  Pertinent  Medical History  Polysubstance Abuse - amphetamines, heroin. On Suboxone.   Ruptured Disc  Significant Hospital Events: Including procedures, antibiotic start and stop dates in addition to other pertinent events   . 4/26 transfer from [facility] to [facility] . CT head 4/26 >> no acute findings . CT abd/pelvis 4/26 >> cholelithiasis w/o inflammatory chagnes, mild hepatomegaly (25 cm), moderate splenomegaly (16.7 cm), 2 mm non obstructing kidney stone on Lt . 4/27 CVL placed. Pressors escalating.  . 4/28 intubated for TEE, CVC, HD, and A-line placed. CRRT started  . Remains on CRRT . MRI on 4/30-multiple infarcts . 5/4: repeat TEE shows an aortic valve abscess . 5/5: extubated , self-removed HD cath so CRRT holiday> developed severe hyperkalemia overnight. . 5/6: new temp HD cath placed  . 5/8: new temp HD cath placed after prior one self-removed by patient . 5/9: Orthopantogram neg for abscess . 5/10: HD cath replaced by IR Perm Cath . 5/12 to OR for AVR, aortic root reconstruction, closure of aortic sinus track. To 2H ICU on vent post op.  4 units PRBC given intraop.   Interim History / Subjective:    Objective   Blood pressure 109/69, pulse 89,  temperature 100 F (37.8 C), resp. rate 20, height '6\' 4"'$  (1.93 m), weight 114.3 kg, SpO2 100 %. PAP: (26-37)/(15-25) 37/25 CO:  [4.1 L/min-5.1 L/min] 4.1 L/min CI:  [1.7 L/min/m2-2.1 L/min/m2] 1.7 L/min/m2  Vent Mode: SIMV;PSV;PRVC FiO2 (%):  [50 %] 50 % Set Rate:  [12 bmp] 12 bmp Vt Set:  [690 mL] 690 mL PEEP:  [5 cmH20] 5 cmH20 Pressure Support:  [10 cmH20] 10 cmH20 Plateau Pressure:  [22 cmH20] 22 cmH20   Intake/Output Summary (Last 24 hours) at 08/03/2020 1724 Last data filed at 08/03/2020 1600 Gross per 24 hour  Intake 2843.41 ml  Output 417 ml  Net 2426.41 ml   Filed Weights   08/01/20 0323 08/02/20 0349 08/03/20 0300  Weight: 102.6 kg 114.9 kg 114.3 kg    Physical Exam:  General: overweight middle aged male in NAD HEENT: ETT, PERRL, no JVD Respiratory: Bilateral vent assisted breath sounds Cardiovascular: RRR, no MRG. Midline sternal incision, surgical dressing in place.  GI: BS+, soft, nontender, non-distended Extremities:no acute deformity. No edema.  Neuro: spontaneously awake, alert. Sedate.   Labs/imaging that I havepersonally reviewed    WBC 22.6 Hemoglobin 9.6 Plt 143 INR 1.6  Resolved Hospital Problem list   Hyponatremia, septic shock, metabolic encephalopathy, acute hypoxic resp failure   Assessment & Plan:   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 Negative orthopantogram. Underwent aortic root reconstruction, and closure of aortic sinus track 5/12 (Dr. Kipp Brood) -Continue cefepime per ID -Management per TCTS, ID -Supportive care -Monitor chest tube  output.  Cardiogenic shock s/p cardiac surgery - Per TCTS - Levophed, vasopressin.  - Epinephrine off - MAP goal 65 - Follow CBC and transfuse as indicated.   Acute hypoxemic respiratory failure s/p cardiac surgery r/t shock.  - Full vent support - Wean per cardiac surgery protocol - CXR/ABG reviewed - VAP bundle  Acute kidney  injury s/p perm cath 5/10 Hyperkalemia Anuria Likely ischemic ATN in the setting of septic shock and septic emboli.  Requiring CRRT since 4/28, however traumatically pulled out HD line on 5/5 and 5/8 S/p perm cath on 5/10 - Bladder scans Qshift to monitor for urine production - CRRT restarting in a few minutes here - BMP pending  Acute anemia Transfused PRBC x 3U total on 5/9 and 5/10. 4 units given 5/12 intra-op. Thrombocytopenia resolved - transfuse for Hb <7 or hemodynamically significant bleeding - trend CBC  Pain management post op Polysubstance abuse -Suboxone, amphetamines -Cessation education has been provided -Scheduled tylenol -oxy IR now and PRN -lidoderm patch to either side of sternal incision.   Hyperglycemia - insulin infusion - Endotool  Metabolic encephalopathy post op Agitation, impulsiveness -Precedex for RASS 0 -Sedation and extubation per TCTS post op protocol -Scheduled melatonin, clonazepam  Best practice   Diet:  NPO  Pain/Anxiety/Delirium protocol (if indicated): Yes (RASS goal 0) VAP protocol (if indicated): Yes DVT prophylaxis: Subcutaneous Heparin and SCD GI prophylaxis: PPI Glucose control:  Insulin gtt and SSI No Central venous access:  Yes, and it is still needed Arterial line:  Yes, and it is still needed Foley:  Yes, and it is still needed Mobility:  bed rest  PT consulted: N/A Last date of multidisciplinary goals of care discussion: Family updated Code Status:  full code Disposition: ICU  Critical care time 40 minutes.   Georgann Housekeeper, AGACNP-BC [place] [facility]  See Amion for personal pager PCCM on call pager [phone] until 7pm. Please call Elink 7p-7a. [phone]  08/03/2020 6:04 PM

## 2020-08-03 NOTE — Progress Notes (Signed)
Reviewed blood gas, k up to 6.4. restart crrt. All bags changed to 2k/2.5cal

## 2020-08-03 NOTE — Anesthesia Procedure Notes (Signed)
Procedure Name: Intubation Date/Time: 08/03/2020 8:21 AM Performed by: Dorthea Cove, CRNA Pre-anesthesia Checklist: Patient identified, Emergency Drugs available, Suction available and Patient being monitored Patient Re-evaluated:Patient Re-evaluated prior to induction Oxygen Delivery Method: Circle system utilized Preoxygenation: Pre-oxygenation with 100% oxygen Induction Type: IV induction Ventilation: Mask ventilation without difficulty Laryngoscope Size: Mac and 4 Grade View: Grade I Tube type: Oral Tube size: 7.5 mm Number of attempts: 1 Airway Equipment and Method: Stylet and Oral airway Placement Confirmation: ETT inserted through vocal cords under direct vision,  positive ETCO2 and breath sounds checked- equal and bilateral Secured at: 23 cm Tube secured with: Tape Dental Injury: Teeth and Oropharynx as per pre-operative assessment

## 2020-08-03 NOTE — Op Note (Signed)
[address]       [place] [address]             [phone]                                           08/03/2020 Patient:  [name] Pre-Op Dx: Aortic valve endocarditis   Aortic root abscess   History of IV drug abuse   Renal failure currently on CRRT Post-op Dx: Same Procedure: Aortic valve replacement with a 23 mm On-X valve Aortic root reconstruction with bovine pericardium Closure of aortic sinus track with bovine pericardium   Surgeon and Role:      * Shameria Trimarco, Lucile Crater, MD - Primary    *E. Barrett, PA-C- assisting Assistant: Macarthur Critchley, PA-C  Anesthesia  general EBL: 500 ml Blood Administration: 4 units of packed red blood cells Xclamp Time: 133 minutes / 55 minutes Pump Time: 270 minutes min  Drains: 19 F blake drain: mediastinal  Wires: Ventricular Counts: correct   Indications: 40 year old male admitted with aortic valve endocarditis and severe sepsis.  He developed acute renal injury requiring CRRT.  Once he was extubated he was noted to be in bigeminy and a repeat echo was performed which showed an aortic root abscess.  He was recovering well but due to his severe aortic valve insufficiency and root abscess surgery was indicated.  Findings: There was a large vegetation on the left coronary cusp.  There was root abscess underneath the left coronary cusp as well the tract towards the left atrium.  There was no connection.  The abscess was debrided and bovine pericardium was used to reconstruct the annulus.  We were then able to seat a 23 mm On-X valve.  On coming off cross-clamp the first time there was bleeding noted coming from the root behind the the base of the aorta.  The cross-clamp was reapplied and the aortotomy was opened.  There was a sinus tract noted directly above the ostium of the left coronary.  Bovine pericardium was used to control this as well.  After closure we were able to obtain hemostasis.  Operative  Technique: All invasive lines were placed in pre-op holding.  After the risks, benefits and alternatives were thoroughly discussed, the patient was brought to the operative theatre.  Anesthesia was induced, and the patient was prepped and draped in normal sterile fashion.  An appropriate surgical pause was performed, and pre-operative antibiotics were dosed accordingly.  We began with an incision over the chest for the sternotomy.  This was carried down with bovie cautery, and the sternum was divided with a reciprocating saw.  Meticulous hemostasis was obtained.  The patient was systemically heparinized.   The sternal retractor was placed.  The pericardium was divided in the midline and fashioned into a cradle with pericardial stitches.   After we confirmed an appropriate ACT, the ascending aorta was cannulated in standard fashion.  The right atrial appendage was used for venous cannulation site.  Cardiopulmonary bypass was initiated and we began to cool the patient to 32 degrees. The cross clamp was applied, and a dose of anterograde cardioplegia was given with good arrest of the heart.  Our aortotomy was made and directed toward the non coronary cusp.  The valve was inspected.  There was a large vegetation involving the left coronary cusp. It was  a 2 cm abscess cavity.  This was debrided..  All leaflets were excised.  A bovine pericardium was used to close this abscess cavity and reconstruct the annulus.  The annulus was sized to a 23 mm On-X valve.  The left ventricle was then copiously irrigated.  Pledgeted mattress sutures were placed circumferentially through the annulus.  These sutures were then passed through the sewing ring of the valve.  Once the valve was seated in the annulus, it was secured with Core-knot sutures.  We began to rewarm, and close our aortotomy in 2 layers.  A re-animation dose of cardioplegia was given.  After de-airing the heart, the aortic cross clamp was removed.  We checked our  valve function, and for air using the TEE.  There was some flow noted above the left coronary 1 TEE which corresponded to the area of bleeding.  I arrested the heart again after crossclamping it and open the aortotomy.  There was another fistulous tract that was noted above the left coronary ostium.  This was also closed with bovine pericardium.  The aortotomy was then closed again this time with a felt strip.  A re-animation dose of cardioplegia was given.  After de-airing the heart, the aortic cross clamp was removed.  We checked our valve function, and for air using the TEE. Once we were satisfying, we separated from cardiopulmonary bypass without event.    The heparin was reversed with protamine, and hemostasis was obtained.  Chest tubes and wires were placed, and the sternum was re-approximated with with sternal wires.  The soft tissue and skin were re-approximated wth absorbable suture.    The patient tolerated the procedure without any immediate complications, and was transferred to the ICU in guarded condition.  Sathvik Tiedt Bary Leriche

## 2020-08-03 NOTE — Procedures (Signed)
Arterial Catheter Insertion Procedure Note  RYLAND MICHALSKI  [id number]  Apr 04, 1980  Date:08/03/20  Time:9:53 PM    Provider Performing: Carrington Clamp A    Procedure: Insertion of Arterial Line [phone] without US guidance  Indication(s) Blood pressure monitoring and/or need for frequent ABGs  Consent Risks of the procedure as well as the alternatives and risks of each were explained to the patient and/or caregiver.  Consent for the procedure was obtained and is signed in the bedside chart  Anesthesia None   Time Out Verified patient identification, verified procedure, site/side was marked, verified correct patient position, special equipment/implants available, medications/allergies/relevant history reviewed, required imaging and test results available.   Sterile Technique Maximal sterile technique including full sterile barrier drape, hand hygiene, sterile gown, sterile gloves, mask, hair covering, sterile ultrasound probe cover (if used).   Procedure Description Area of catheter insertion was cleaned with chlorhexidine and draped in sterile fashion. Without real-time ultrasound guidance an arterial catheter was placed into the left radial artery.  Appropriate arterial tracings confirmed on monitor.     Complications/Tolerance None; patient tolerated the procedure well.   EBL Minimal   Specimen(s) None

## 2020-08-03 NOTE — Progress Notes (Signed)
Dr. Kipp Brood aware of the following ABG results. Orders received for 1 amp bicarb and 573ms albumin. Stated to restart CRRT when able to correct K 6.3.   Results for PAYO, DEJARDIN(MRN [id number] as of 08/03/2020 19:52  Ref. Range 08/03/2020 16:04  Sample type Unknown ARTERIAL  pH, Arterial Latest Ref Range: 7.350 - 7.450  7.295 (L)  pCO2 arterial Latest Ref Range: 32.0 - 48.0 mmHg 41.8  pO2, Arterial Latest Ref Range: 83.0 - 108.0 mmHg 116 (H)  TCO2 Latest Ref Range: 22 - 32 mmol/L 21 (L)  Acid-base deficit Latest Ref Range: 0.0 - 2.0 mmol/L 6.0 (H)  Bicarbonate Latest Ref Range: 20.0 - 28.0 mmol/L 20.1  O2 Saturation Latest Units: % 98.0  Patient temperature Unknown 37.8 C  Sodium Latest Ref Range: 135 - 145 mmol/L 134 (L)  Potassium Latest Ref Range: 3.5 - 5.1 mmol/L 6.3 (HH)  Calcium Ionized Latest Ref Range: 1.15 - 1.40 mmol/L 1.12 (L)  Hemoglobin Latest Ref Range: 13.0 - 17.0 g/dL 10.2 (L)  HCT Latest Ref Range: 39.0 - 52.0 % 30.0 (L)

## 2020-08-03 NOTE — Transfer of Care (Signed)
Immediate Anesthesia Transfer of Care Note  Patient: Alex [name]  Procedure(s) Performed: BENTALL PROCEDURE USING ON-X AORTIC VALVE SIZE 23 MM AND PERI-GUARD 4CM X 4CM PERICARDIUM (N/A ) TRANSESOPHAGEAL ECHOCARDIOGRAM (TEE) (N/A )  Patient Location: ICU  Anesthesia Type:General  Level of Consciousness: sedated and Patient remains intubated per anesthesia plan  Airway & Oxygen Therapy: Patient remains intubated per anesthesia plan and Patient placed on Ventilator (see vital sign flow sheet for setting)  Post-op Assessment: Report given to RN and Post -op Vital signs reviewed and stable  Post vital signs: Reviewed and stable  Last Vitals:  Vitals Value Taken Time  BP 130/82   Temp    Pulse 94   Resp 15   SpO2 100     Last Pain:  Vitals:   08/03/20 0400  TempSrc:   PainSc: Asleep      Patients Stated Pain Goal: 0 ([id number])  Complications: No complications documented.

## 2020-08-03 NOTE — Anesthesia Procedure Notes (Signed)
Arterial Line Insertion Start/End5/02/2021 6:30 AM, 08/03/2020 7:04 AM Performed by: Dorthea Cove, CRNA, CRNA  Patient location: Pre-op. Preanesthetic checklist: patient identified, IV checked, site marked, risks and benefits discussed, surgical consent, monitors and equipment checked, pre-op evaluation, timeout performed and anesthesia consent Lidocaine 1% used for infiltration Right, radial was placed Catheter size: 20 Fr Hand hygiene performed  and maximum sterile barriers used   Attempts: 1 Procedure performed without using ultrasound guided technique. Following insertion, dressing applied and Biopatch. Post procedure assessment: normal and unchanged  Patient tolerated the procedure well with no immediate complications.

## 2020-08-04 ENCOUNTER — Encounter (HOSPITAL_COMMUNITY): Payer: Self-pay | Admitting: Thoracic Surgery (Cardiothoracic Vascular Surgery)

## 2020-08-04 ENCOUNTER — Inpatient Hospital Stay (HOSPITAL_COMMUNITY): Payer: 59

## 2020-08-04 LAB — RENAL FUNCTION PANEL
Albumin: 2.2 g/dL — ABNORMAL LOW (ref 3.5–5.0)
Albumin: 2 g/dL — ABNORMAL LOW (ref 3.5–5.0)
Anion gap: 9 (ref 5–15)
Anion gap: 7 (ref 5–15)
BUN: 43 mg/dL — ABNORMAL HIGH (ref 6–20)
BUN: 33 mg/dL — ABNORMAL HIGH (ref 6–20)
CO2: 20 mmol/L — ABNORMAL LOW (ref 22–32)
CO2: 23 mmol/L (ref 22–32)
Calcium: 7.4 mg/dL — ABNORMAL LOW (ref 8.9–10.3)
Calcium: 7.4 mg/dL — ABNORMAL LOW (ref 8.9–10.3)
Chloride: 102 mmol/L (ref 98–111)
Chloride: 99 mmol/L (ref 98–111)
Creatinine, Ser: 3.95 mg/dL — ABNORMAL HIGH (ref 0.61–1.24)
Creatinine, Ser: 3.22 mg/dL — ABNORMAL HIGH (ref 0.61–1.24)
GFR, Estimated: 19 mL/min — ABNORMAL LOW (ref >60)
GFR, Estimated: 24 mL/min — ABNORMAL LOW (ref >60)
Glucose, Bld: 108 mg/dL — ABNORMAL HIGH (ref 70–99)
Glucose, Bld: 102 mg/dL — ABNORMAL HIGH (ref 70–99)
Phosphorus: 4.3 mg/dL (ref 2.5–4.6)
Phosphorus: 3.5 mg/dL (ref 2.5–4.6)
Potassium: 5.4 mmol/L — ABNORMAL HIGH (ref 3.5–5.1)
Potassium: 4.4 mmol/L (ref 3.5–5.1)
Sodium: 131 mmol/L — ABNORMAL LOW (ref 135–145)
Sodium: 129 mmol/L — ABNORMAL LOW (ref 135–145)

## 2020-08-04 LAB — GLUCOSE, CAPILLARY
Glucose-Capillary: 104 mg/dL — ABNORMAL HIGH (ref 70–99)
Glucose-Capillary: 105 mg/dL — ABNORMAL HIGH (ref 70–99)
Glucose-Capillary: 106 mg/dL — ABNORMAL HIGH (ref 70–99)
Glucose-Capillary: 121 mg/dL — ABNORMAL HIGH (ref 70–99)
Glucose-Capillary: 124 mg/dL — ABNORMAL HIGH (ref 70–99)
Glucose-Capillary: 124 mg/dL — ABNORMAL HIGH (ref 70–99)
Glucose-Capillary: 131 mg/dL — ABNORMAL HIGH (ref 70–99)
Glucose-Capillary: 131 mg/dL — ABNORMAL HIGH (ref 70–99)
Glucose-Capillary: 98 mg/dL (ref 70–99)
Glucose-Capillary: 99 mg/dL (ref 70–99)

## 2020-08-04 LAB — PREPARE FRESH FROZEN PLASMA
Unit division: 0
Unit division: 0
Unit division: 0
Unit division: 0

## 2020-08-04 LAB — BPAM FFP
Blood Product Expiration Date: 202205162359
Blood Product Expiration Date: 202205162359
Blood Product Expiration Date: 202205172359
Blood Product Expiration Date: 202205172359
ISSUE DATE / TIME: 202205120904
ISSUE DATE / TIME: 202205120904
ISSUE DATE / TIME: 202205120904
ISSUE DATE / TIME: 202205120904
Unit Type and Rh: 600
Unit Type and Rh: 6200
Unit Type and Rh: 6200
Unit Type and Rh: 6200

## 2020-08-04 LAB — CBC
HCT: 21 % — ABNORMAL LOW (ref 39.0–52.0)
HCT: 18.3 % — ABNORMAL LOW (ref 39.0–52.0)
Hemoglobin: 6.9 g/dL — CL (ref 13.0–17.0)
Hemoglobin: 6.1 g/dL — CL (ref 13.0–17.0)
MCH: 28.3 pg (ref 26.0–34.0)
MCH: 28.5 pg (ref 26.0–34.0)
MCHC: 32.9 g/dL (ref 30.0–36.0)
MCHC: 33.3 g/dL (ref 30.0–36.0)
MCV: 86.1 fL (ref 80.0–100.0)
MCV: 85.5 fL (ref 80.0–100.0)
Platelets: 137 10*3/uL — ABNORMAL LOW (ref 150–400)
Platelets: UNDETERMINED 10*3/uL (ref 150–400)
RBC: 2.44 MIL/uL — ABNORMAL LOW (ref 4.22–5.81)
RBC: 2.14 MIL/uL — ABNORMAL LOW (ref 4.22–5.81)
RDW: 16.1 % — ABNORMAL HIGH (ref 11.5–15.5)
RDW: 16.1 % — ABNORMAL HIGH (ref 11.5–15.5)
WBC: 11.9 10*3/uL — ABNORMAL HIGH (ref 4.0–10.5)
WBC: 7.6 10*3/uL (ref 4.0–10.5)
nRBC: 0 % (ref 0.0–0.2)
nRBC: 0 % (ref 0.0–0.2)

## 2020-08-04 LAB — PREPARE RBC (CROSSMATCH)

## 2020-08-04 LAB — MAGNESIUM
Magnesium: 3 mg/dL — ABNORMAL HIGH (ref 1.7–2.4)
Magnesium: 2.8 mg/dL — ABNORMAL HIGH (ref 1.7–2.4)

## 2020-08-04 LAB — SURGICAL PATHOLOGY

## 2020-08-04 MED ORDER — INSULIN ASPART 100 UNIT/ML IJ SOLN
0.0000 [IU] | INTRAMUSCULAR | Status: DC
Start: 2020-08-04 — End: 2020-08-05

## 2020-08-04 MED ORDER — PRISMASOL BGK 4/2.5 32-4-2.5 MEQ/L REPLACEMENT SOLN: Status: DC

## 2020-08-04 MED ORDER — ENOXAPARIN SODIUM 40 MG/0.4ML IJ SOSY
40.0000 mg | PREFILLED_SYRINGE | Freq: Every day | INTRAMUSCULAR | Status: DC
  Administered 2020-08-04 – 2020-08-05 (×2): 40 mg via SUBCUTANEOUS
  Filled 2020-08-04 (×2): qty 0.4

## 2020-08-04 MED ORDER — INSULIN ASPART 100 UNIT/ML IJ SOLN: 0.0000 [IU] | INTRAMUSCULAR | Status: DC

## 2020-08-04 MED ORDER — GERHARDT'S BUTT CREAM
TOPICAL_CREAM | Freq: Two times a day (BID) | CUTANEOUS | Status: DC
  Administered 2020-08-04 – 2020-08-06 (×3): 1 via TOPICAL
  Filled 2020-08-04: qty 1

## 2020-08-04 MED ORDER — COLLAGENASE 250 UNIT/GM EX OINT
TOPICAL_OINTMENT | Freq: Every day | CUTANEOUS | Status: DC
  Administered 2020-08-04: 1 via TOPICAL
  Filled 2020-08-04: qty 30

## 2020-08-04 MED ORDER — WARFARIN - PHYSICIAN DOSING INPATIENT: Freq: Every day | Status: DC

## 2020-08-04 MED ORDER — SODIUM CHLORIDE 0.9 % IV SOLN: INTRAVENOUS | Status: DC | PRN

## 2020-08-04 MED ORDER — WARFARIN SODIUM 2.5 MG PO TABS: 2.5000 mg | ORAL_TABLET | Freq: Every day | ORAL | Status: DC

## 2020-08-04 MED ORDER — WARFARIN SODIUM 5 MG PO TABS
5.0000 mg | ORAL_TABLET | Freq: Every day | ORAL | Status: DC
  Administered 2020-08-04 – 2020-08-05 (×2): 5 mg via ORAL
  Filled 2020-08-04 (×2): qty 1

## 2020-08-04 MED ORDER — SODIUM CHLORIDE 0.9% IV SOLUTION: Freq: Once | INTRAVENOUS | Status: AC

## 2020-08-04 NOTE — Progress Notes (Signed)
      [address]       [place] [address]             [phone]                 1 Day Post-Op Procedure(s) (LRB): BENTALL PROCEDURE USING ON-X AORTIC VALVE SIZE 23 MM AND PERI-GUARD 4CM X 4CM PERICARDIUM (N/A) TRANSESOPHAGEAL ECHOCARDIOGRAM (TEE) (N/A)   Events: No events this am Agitated last night after extubation _______________________________________________________________ Vitals: BP (!) 117/101   Pulse (!) 109   Temp 97.88 F (36.6 C)   Resp (!) 29   Ht '6\' 4"'$  (1.93 m)   Wt 122.7 kg   SpO2 100%   BMI 32.93 kg/m   - Neuro: alert NAD  - Cardiovascular: sinus tach  Drips: Levo 12.   PAP: (26-49)/(11-25) 33/21 CO:  [4.1 L/min-6.2 L/min] 6.2 L/min CI:  [1.7 L/min/m2-2.5 L/min/m2] 2.5 L/min/m2  - Pulm: EWOB  ABG    Component Value Date/Time   PHART 7.405 08/03/2020 2033   PCO2ART 31.4 (L) 08/03/2020 2033   PO2ART 81 (L) 08/03/2020 2033   HCO3 19.6 (L) 08/03/2020 2033   TCO2 21 (L) 08/03/2020 2033   ACIDBASEDEF 4.0 (H) 08/03/2020 2033   O2SAT 96.0 08/03/2020 2033    - Abd: ND - Extremity: edematous  .Intake/Output      05/12 0701 05/13 0700 05/13 0701 05/14 0700   P.O. 420 170   I.V. (mL/kg) 2825.1 (23) 57.5 (0.5)   Blood 790    IV Piggyback 1725    Total Intake(mL/kg) 5760 (46.9) 227.5 (1.9)   Urine (mL/kg/hr) 125 (0)    Other 963    Stool     Chest Tube 630    Total Output 1718    Net +4042 +227.5           _______________________________________________________________ Labs: CBC Latest Ref Rng & Units 08/04/2020 08/03/2020 08/03/2020  WBC 4.0 - 10.5 K/uL 11.9(H) 17.6(H) -  Hemoglobin 13.0 - 17.0 g/dL 6.9(LL) 7.6(L) 7.8(L)  Hematocrit 39.0 - 52.0 % 21.0(L) 23.6(L) 23.0(L)  Platelets 150 - 400 K/uL 137(L) 166 -   CMP Latest Ref Rng & Units 08/04/2020 08/03/2020 08/03/2020  Glucose 70 - 99 mg/dL 108(H) 110(H) -  BUN 6 - 20 mg/dL 43(H) 45(H) -  Creatinine 0.61 - 1.24 mg/dL 3.95(H) 4.16(H) -  Sodium 135 - 145 mmol/L  131(L) 134(L) 136  Potassium 3.5 - 5.1 mmol/L 5.4(H) 6.4(HH) 6.7(HH)  Chloride 98 - 111 mmol/L 102 108 -  CO2 22 - 32 mmol/L 20(L) 19(L) -  Calcium 8.9 - 10.3 mg/dL 7.4(L) 6.9(L) -  Total Protein 6.5 - 8.1 g/dL - - -  Total Bilirubin 0.3 - 1.2 mg/dL - - -  Alkaline Phos 38 - 126 U/L - - -  AST 15 - 41 U/L - - -  ALT 0 - 44 U/L - - -    CXR: PV congestion.  Pneumopericardium stable  _______________________________________________________________  Assessment and Plan: POD 1 s/p mechanical AVR, annular patch with bovie pericardium  Neuro: pain controlled.  Watch for drug seeking CV: will wean levo.  Will start coumadin today.  Will remove swan Pulm: pulm toilet.  Will keel CT for now Renal: on CRRT GI: renal diet Heme: stable.  Will start coumadin ID: on abx for endocarditis Endo: SSI Dispo: continue ICU care   Lajuana Matte 08/04/2020 8:56 AM

## 2020-08-04 NOTE — Progress Notes (Signed)
Pharmacy Antibiotic Note  Alex [name] is a 40 y.o. male admitted on 07/18/2020 and found to have Serratia bacteremia with TEE showing endocarditis. Pharmacy has been consulted for Cefepime dosing. 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 ID planning for antibiotics stop date 6 weeks from valve repair. Valve repair occurred on 08/02/20. Patient continues on CRRT.  This is day 17 of cefepime (day 1 after valve repair)  WBC 5/9 11.9, patient remains afebrile.    Plan: - Cefepime 2g IV q12 hours - Follow-up plans for intermittent HD and Cefepime dose adjustments - Monitor clinical improvement, culture results, ability to de-escalate antibiotics  Height: '6\' 4"'$  (193 cm) Weight: 122.7 kg (270 lb 8.1 oz) IBW/kg (Calculated) : 86.8  Temp (24hrs), Avg:99.2 F (37.3 C), Min:97.7 F (36.5 C), Max:100.58 F (38.1 C)  Recent Labs  Lab 08/02/20 0210 08/02/20 1550 08/03/20 0325 08/03/20 0828 08/03/20 1312 08/03/20 1355 08/03/20 1426 08/03/20 1604 08/03/20 2037 08/04/20 0459  WBC 8.8  --  8.8  --   --   --   --  22.6* 17.6* 11.9*  CREATININE 3.19*   < > 3.86*   < > 3.90* 3.90* 3.70*  --  4.16* 3.95*   < > = values in this interval not displayed.    Estimated Creatinine Clearance: 36.7 mL/min (A) (by C-G formula based on SCr of 3.95 mg/dL (H)).    No Known Allergies  Antimicrobials this admission: Vancomycin 4/26 x 1 Cefepime 4/26 >>  Dose adjustments this admission: 5/5: Cefepime adjusted to 1g IV q24 hours starting 5/6 (received 2g on 5/5) 5/6: Cefepime adjusted to 2g IV q12 hours - CRRT restarted  Microbiology results: 4/26 Flu/COVID >> neg 4/26 BCx >> Serratia (R cefazolin) 4/28 BCx >> ngtd  Thank you for allowing pharmacy to be a part of this patient's care.  Wilson Singer, PharmD PGY1 Pharmacy Resident 08/04/2020 10:57 AM  **Pharmacist phone directory can now be found on [email or web address] ([email or web address]).  Listed under [place].

## 2020-08-04 NOTE — Progress Notes (Signed)
Admit: 07/18/2020 LOS: 98  38M AV IE and perivalvular abscess, serrata sp.  For AVR repair this week;  Anuric AKI from ATN-  Normal crt in Feb 2022 then 5 on 07/18/20 in the setting of IE  Current CRRT Prescription: Start Date: first RRT4/28 then off and on but resumed 5/12 after surgery Catheter: R IJ Temp HC Cath placed 07/28/20 by CCM BFR: 200-250 Pre Blood Pump: 500 2K DFR: 1500 2K Replacement Rate: 300 2K Goal UF: 6m/h net neg Anticoagulation: none   S:   S/p mechanical AVR and annular patch 5/12-  Became hyperkalemic after procedure req re initiation of CRRT -  Also became agitated after extubation-  More calm today-  Mother at bedside   O: 05/12 0701 - 05/13 0700 In: [id number][P.O.:420; I.V.:2825.1; Blood:790; IV [id number]: [id number][Urine:125; Chest Tube:630]   gen-  Alert, nervous appearing Lungs-  On vent cv-  RRR abd-  Soft, nd Ext-  No edema   Filed Weights   08/02/20 0349 08/03/20 0300 08/04/20 0300  Weight: 114.9 kg 114.3 kg 122.7 kg    Recent Labs  Lab 08/03/20 0325 08/03/20 0828 08/03/20 1426 08/03/20 1430 08/03/20 2033 08/03/20 2037 08/04/20 0459  NA 131*   < > 135   < > 136 134* 131*  K 4.3   < > 5.4*   < > 6.7* 6.4* 5.4*  CL 99   < > 105  --   --  108 102  CO2 24  --   --   --   --  19* 20*  GLUCOSE 97   < > 136*  --   --  110* 108*  BUN 38*   < > 52*  --   --  45* 43*  CREATININE 3.86*   < > 3.70*  --   --  4.16* 3.95*  CALCIUM 8.0*  --   --   --   --  6.9* 7.4*  PHOS 3.3  --   --   --   --  5.7* 4.3   < > = values in this interval not displayed.   Recent Labs  Lab 08/03/20 1604 08/03/20 1834 08/03/20 2033 08/03/20 2037 08/04/20 0459  WBC 22.6*  --   --  17.6* 11.9*  HGB 10.2*  9.6*   < > 7.8* 7.6* 6.9*  HCT 30.0*  29.9*   < > 23.0* 23.6* 21.0*  MCV 88.5  --   --  87.1 86.1  PLT 143*  --   --  166 137*   < > = values in this interval not displayed.    Scheduled Meds: . (feeding supplement) PROSource Plus  30 mL Oral BID  BM  . acetaminophen  1,000 mg Oral Q6H   Or  . acetaminophen (TYLENOL) oral liquid 160 mg/5 mL  1,000 mg Per Tube Q6H  . aspirin EC  325 mg Oral Daily   Or  . aspirin  324 mg Per Tube Daily  . bisacodyl  10 mg Oral Daily   Or  . bisacodyl  10 mg Rectal Daily  . Chlorhexidine Gluconate Cloth  6 each Topical Daily  . clonazePAM  0.5 mg Oral BID  . collagenase   Topical Daily  . darbepoetin (ARANESP) injection - NON-DIALYSIS  200 mcg Subcutaneous Q Wed-1800  . docusate sodium  200 mg Oral Daily  . enoxaparin (LOVENOX) injection  40 mg Subcutaneous QHS  . fenofibrate  160 mg Oral Daily  . Gerhardt's butt cream  Topical BID  . insulin aspart  0-24 Units Subcutaneous Q4H  . lidocaine  2 patch Transdermal Q24H  . melatonin  3 mg Oral QHS  . metoprolol tartrate  12.5 mg Oral BID   Or  . metoprolol tartrate  12.5 mg Per Tube BID  . oxyCODONE  10 mg Oral Once  . sodium chloride flush  3 mL Intravenous Q12H  . warfarin  5 mg Oral q1600  . Warfarin - Physician Dosing Inpatient   Does not apply q1600   Continuous Infusions: . sodium chloride 10 mL/hr at 08/04/20 0800  . sodium chloride    . sodium chloride 10 mL/hr at 08/03/20 1639  . sodium chloride 10 mL/hr at 08/04/20 0800  . albumin human 12.5 g (08/03/20 2010)  .  ceFAZolin (ANCEF) IV Stopped (08/03/20 2119)  . ceFEPime (MAXIPIME) IV Stopped (08/04/20 0047)  . dexmedetomidine (PRECEDEX) IV infusion Stopped (08/04/20 0531)  . DOBUTamine    . lactated ringers    . lactated ringers    . lactated ringers    . magnesium sulfate    . nitroGLYCERIN Stopped (08/03/20 1601)  . norepinephrine (LEVOPHED) Adult infusion 10 mcg/min (08/04/20 0800)  . prismasol BGK 2/2.5 dialysis solution 1,500 mL/hr at 08/04/20 0734  . prismasol BGK 2/2.5 replacement solution 300 mL/hr at 08/04/20 0037  . prismasol BGK 2/2.5 replacement solution 500 mL/hr at 08/04/20 0038   PRN Meds:.sodium chloride, sodium chloride, albumin human, heparin, heparin,  lactated ringers, metoprolol tartrate, morphine injection, ondansetron (ZOFRAN) IV, oxyCODONE, QUEtiapine, sodium chloride, sodium chloride flush, traMADol  ABG    Component Value Date/Time   PHART 7.405 08/03/2020 2033   PCO2ART 31.4 (L) 08/03/2020 2033   PO2ART 81 (L) 08/03/2020 2033   HCO3 19.6 (L) 08/03/2020 2033   TCO2 21 (L) 08/03/2020 2033   ACIDBASEDEF 4.0 (H) 08/03/2020 2033   O2SAT 96.0 08/03/2020 2033    A/P  1. 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  oligoanuric right now which is not a good sign.  CRRT re initiated s/p surgery on 5/12 and with pressor requirement, to continue today  2. Hyperkalemia 07/28/2020, severe, improved-  changed back to 4 k bath.  Has had intermittent issues with hyperK,  Went up after surgery not surprisingly-  Now on 2 k bath with down trending k as of this AM-  Continue with bid labs and change k bath when needed 3. Serratia aortic valve endocarditis with perivalvular abscess on cefepime, per ID and T CTS, s/p bentall procedure 5/12.  WBC down 4. VDRF, resolved, normal intermittently on BiPAP--- extubated this AM 5. Anemia, transfusion per CCM-  On aranesp qwed-  May need blood today  6. Shock: currently on levo, pressors per primary service   [facility]

## 2020-08-04 NOTE — Consult Note (Addendum)
[facility] Nurse Consult Note: Reason for Consult: Pt is critially ill with multiple systemic factors which can impair healing.  He states he "had wounds on his butt" prior to admission; these have apparently declined.  Wound type: Pt has scattered areas of partial thickness skin loss related to moisture associated skin damage to bilat buttocks.  Left buttock with partial thickness wound, red and moist, .3X.3X.1cm Inner gluteal fold/sacrum with Unstageable presure injury; 5.5X5cm, 100% yellow slough, small amt tan drainage  Left buttock with Unstageable pressure injury; 1X.5cm, 100% yellow slough Pressure Injury POA: Yes Dressing procedure/placement/frequency: Pt is on a low airloss mattesss to decreasde pressure.  Gerharts cream to buttocks top protect and promote healing. Topical treatment orders provided for bedside nurses to perform to assist with enzymatic debridement as follows: Apply Santyl to gluteal fold and right buttock wounds Q day, then cover with moist gauze.  Apply foam dressing (Change foam dressing Q 3 days or PRN soiling.) Please re-consult if further assistance is needed.  Thank-you,  Julien Girt MSN, [place], [place], [place], [place]

## 2020-08-04 NOTE — Progress Notes (Signed)
Pt became very agitated. Pulled out a-line,PIV, v epicardial wire. All intact. Pressure dsg applied to all sites. Pt stating I want to go home. Pt reoriented of the need and importance of post-[facility] care. MD notified orders received. Restraints for safety needed. Will work to progress out of restraints as confusion and agitation decreases.

## 2020-08-04 NOTE — Progress Notes (Signed)
NAME:  Alex [name], MRN:  [id number], DOB:  Mar 23, 1981, LOS: 38 ADMISSION DATE:  07/18/2020, CONSULTATION DATE:  4/26 REFERRING MD:  Dr. Florina Ou, CHIEF COMPLAINT:  AMS   History of Present Illness:  40 yo male smoker brought to [facility] ER with altered mental status.  Noted to have headache and feeling feverish before admission.  Found to have hypotension, AKI, thrombocytopenia, splenomegaly, lactic acidosis, elevated bilirubin, hypoglycemia.  Started on antibiotics and pressors for possible sepsis, but no clear source of infection.  Normal renal function from February 2022.  Uses suboxone for history of substance abuse.  UDS positive for amphetamines.  Transfer to [facility] for further therapy.  Hx from chart, medical team, and pt's mother.  Pertinent  Medical History  Polysubstance Abuse - amphetamines, heroin. On Suboxone.   Ruptured Disc  Significant Hospital Events: Including procedures, antibiotic start and stop dates in addition to other pertinent events   . 4/26 transfer from [facility] to [facility] . CT head 4/26 >> no acute findings . CT abd/pelvis 4/26 >> cholelithiasis w/o inflammatory chagnes, mild hepatomegaly (25 cm), moderate splenomegaly (16.7 cm), 2 mm non obstructing kidney stone on Lt . 4/27 CVL placed. Pressors escalating.  . 4/28 intubated for TEE, CVC, HD, and A-line placed. CRRT started  . Remains on CRRT . MRI on 4/30-multiple infarcts . 5/4: repeat TEE shows an aortic valve abscess . 5/5: extubated , self-removed HD cath so CRRT holiday> developed severe hyperkalemia overnight. . 5/6: new temp HD cath placed  . 5/8: new temp HD cath placed after prior one self-removed by patient . 5/9: Orthopantogram neg for abscess . 5/10: HD cath replaced by IR Perm Cath . 5/12 to OR for AVR, aortic root reconstruction, closure of aortic sinus track. To 2H ICU on vent post op.  4 units PRBC given intraop.  . 5/13 extubated, on CRRT, remains on pressors  Interim History / Subjective:   Awake  alert following commands.  States that he feels okay this morning.  Objective   Blood pressure (!) 117/101, pulse (!) 109, temperature 97.88 F (36.6 C), resp. rate (!) 29, height '6\' 4"'$  (1.93 m), weight 122.7 kg, SpO2 100 %. PAP: (26-49)/(11-25) 33/21 CO:  [4.1 L/min-6.2 L/min] 6.2 L/min CI:  [1.7 L/min/m2-2.5 L/min/m2] 2.5 L/min/m2  Vent Mode: PSV;CPAP FiO2 (%):  [40 %-50 %] 40 % Set Rate:  [12 bmp] 12 bmp Vt Set:  [690 mL] 690 mL PEEP:  [5 cmH20] 5 cmH20 Pressure Support:  [10 cmH20] 10 cmH20 Plateau Pressure:  [16 cmH20-22 cmH20] 16 cmH20   Intake/Output Summary (Last 24 hours) at 08/04/2020 0907 Last data filed at 08/04/2020 0850 Gross per 24 hour  Intake 5462.56 ml  Output 1718 ml  Net 3744.56 ml   Filed Weights   08/02/20 0349 08/03/20 0300 08/04/20 0300  Weight: 114.9 kg 114.3 kg 122.7 kg    Physical Exam:  General: Obese middle-aged male sitting up in bed on CVVHD and pressors HEENT: Alert following commands tracks appropriately Respiratory: Bilateral breath sounds, Cardiovascular: Regular rate rhythm, S1-S2 GI: Obese, soft nontender nondistended Extremities: Bilateral dependent lower extremity edema Neuro: Alert oriented following commands  Labs/imaging that I havepersonally reviewed    WBC 22.6>> 11.9 Hemoglobin 9.6>> 6.9 Plt 143>> 137  Potassium 5.4 Serum creatinine 3.95  Resolved Hospital Problem list   Hyponatremia, septic shock, metabolic encephalopathy, acute hypoxic resp failure   Assessment & Plan:   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  Negative orthopantogram. Underwent aortic root reconstruction, and closure of aortic sinus track 5/12 (Dr. Kipp Brood) Plan: Continue cefepime per infectious disease Postop management per T CTS Supportive care.  Mediastinal drain remains in place  Cardiogenic shock s/p cardiac surgery Plan: Wean from Levophed to maintain mean arterial pressure  greater than 65. Slowly improving it appears.  Acute hypoxemic respiratory failure s/p cardiac surgery r/t shock.,  Resolved Plan: Full mechanical vent support discontinued Was extubated yesterday doing well. No longer requiring oxygen supplementation  Acute kidney injury s/p perm cath 5/10 Hyperkalemia Anuria Likely ischemic ATN in the setting of septic shock and septic emboli.  Requiring CRRT since 4/28, however traumatically pulled out HD line on 5/5 and 5/8 S/p perm cath on 5/10 Plan: Continue on CRRT while on pressors. May be able to return to HD soon  Acute anemia Transfused PRBC x 3U total on 5/9 and 5/10. 4 units given 5/12 intra-op. Thrombocytopenia resolved Plan: Received transfusion postoperatively. Continue to observe, conservative transfusion threshold.  Pain management post op Polysubstance abuse -Suboxone, amphetamines Plan: Continue Oxy IR  Lidocaine patches   Hyperglycemia Plan: Transition from insulin infusion to subcu  Acute metabolic encephalopathy post op, resolved Agitation, impulsiveness Plan Melatonin nightly Clonazepam  Best practice   Diet:  NPO  Pain/Anxiety/Delirium protocol (if indicated): Yes (RASS goal 0) VAP protocol (if indicated): Yes DVT prophylaxis: Subcutaneous Heparin and SCD GI prophylaxis: PPI Glucose control:  Insulin gtt and SSI No Central venous access:  Yes, and it is still needed Arterial line:  Yes, and it is still needed Foley:  Yes, and it is still needed Mobility:  bed rest  PT consulted: N/A Last date of multidisciplinary goals of care discussion: Family updated Code Status:  full code Disposition: ICU   This patient is critically ill with multiple organ system failure; which, requires frequent high complexity decision making, assessment, support, evaluation, and titration of therapies. This was completed through the application of advanced monitoring technologies and extensive interpretation of multiple  databases. During this encounter critical care time was devoted to patient care services described in this note for 32 minutes.  Garner Nash, DO [facility] 08/04/2020 9:08 AM

## 2020-08-04 NOTE — Social Work (Signed)
CSW received consult for substance use resources for patient. CSW spoke with patient at bedside. CSW offered patient outpatient substance use treatment services resources. Patient accepted.CSW will continue to follow.

## 2020-08-05 ENCOUNTER — Inpatient Hospital Stay (HOSPITAL_COMMUNITY): Payer: 59

## 2020-08-05 LAB — RENAL FUNCTION PANEL
Albumin: 1.9 g/dL — ABNORMAL LOW (ref 3.5–5.0)
Albumin: 1.9 g/dL — ABNORMAL LOW (ref 3.5–5.0)
Anion gap: 6 (ref 5–15)
Anion gap: 7 (ref 5–15)
BUN: 27 mg/dL — ABNORMAL HIGH (ref 6–20)
BUN: 23 mg/dL — ABNORMAL HIGH (ref 6–20)
CO2: 24 mmol/L (ref 22–32)
CO2: 25 mmol/L (ref 22–32)
Calcium: 7.6 mg/dL — ABNORMAL LOW (ref 8.9–10.3)
Calcium: 7.4 mg/dL — ABNORMAL LOW (ref 8.9–10.3)
Chloride: 100 mmol/L (ref 98–111)
Chloride: 100 mmol/L (ref 98–111)
Creatinine, Ser: 2.93 mg/dL — ABNORMAL HIGH (ref 0.61–1.24)
Creatinine, Ser: 2.55 mg/dL — ABNORMAL HIGH (ref 0.61–1.24)
GFR, Estimated: 27 mL/min — ABNORMAL LOW (ref >60)
GFR, Estimated: 32 mL/min — ABNORMAL LOW (ref >60)
Glucose, Bld: 91 mg/dL (ref 70–99)
Glucose, Bld: 103 mg/dL — ABNORMAL HIGH (ref 70–99)
Phosphorus: 4 mg/dL (ref 2.5–4.6)
Phosphorus: 3.8 mg/dL (ref 2.5–4.6)
Potassium: 3.9 mmol/L (ref 3.5–5.1)
Potassium: 3.4 mmol/L — ABNORMAL LOW (ref 3.5–5.1)
Sodium: 130 mmol/L — ABNORMAL LOW (ref 135–145)
Sodium: 132 mmol/L — ABNORMAL LOW (ref 135–145)

## 2020-08-05 LAB — CBC
HCT: 19.7 % — ABNORMAL LOW (ref 39.0–52.0)
Hemoglobin: 6.6 g/dL — CL (ref 13.0–17.0)
MCH: 28.7 pg (ref 26.0–34.0)
MCHC: 33.5 g/dL (ref 30.0–36.0)
MCV: 85.7 fL (ref 80.0–100.0)
Platelets: 100 10*3/uL — ABNORMAL LOW (ref 150–400)
RBC: 2.3 MIL/uL — ABNORMAL LOW (ref 4.22–5.81)
RDW: 16.1 % — ABNORMAL HIGH (ref 11.5–15.5)
WBC: 7.5 10*3/uL (ref 4.0–10.5)
nRBC: 0 % (ref 0.0–0.2)

## 2020-08-05 LAB — PROTIME-INR
INR: 1.6 — ABNORMAL HIGH (ref 0.8–1.2)
Prothrombin Time: 18.9 seconds — ABNORMAL HIGH (ref 11.4–15.2)

## 2020-08-05 LAB — GLUCOSE, CAPILLARY
Glucose-Capillary: 109 mg/dL — ABNORMAL HIGH (ref 70–99)
Glucose-Capillary: 117 mg/dL — ABNORMAL HIGH (ref 70–99)
Glucose-Capillary: 77 mg/dL (ref 70–99)
Glucose-Capillary: 84 mg/dL (ref 70–99)
Glucose-Capillary: 86 mg/dL (ref 70–99)
Glucose-Capillary: 95 mg/dL (ref 70–99)
Glucose-Capillary: 98 mg/dL (ref 70–99)

## 2020-08-05 LAB — PREPARE RBC (CROSSMATCH)

## 2020-08-05 LAB — MAGNESIUM: Magnesium: 2.7 mg/dL — ABNORMAL HIGH (ref 1.7–2.4)

## 2020-08-05 MED ORDER — INSULIN ASPART 100 UNIT/ML IJ SOLN: 0.0000 [IU] | Freq: Three times a day (TID) | INTRAMUSCULAR | Status: DC

## 2020-08-05 MED ORDER — INSULIN ASPART 100 UNIT/ML IJ SOLN: 0.0000 [IU] | INTRAMUSCULAR | Status: DC

## 2020-08-05 MED ORDER — SODIUM CHLORIDE 0.9% IV SOLUTION: Freq: Once | INTRAVENOUS | Status: AC

## 2020-08-05 MED ORDER — SODIUM CHLORIDE 0.9 % IV SOLN
2.0000 g | Freq: Two times a day (BID) | INTRAVENOUS | Status: DC
  Administered 2020-08-05 – 2020-08-06 (×3): 2 g via INTRAVENOUS
  Filled 2020-08-05 (×3): qty 2

## 2020-08-05 NOTE — Plan of Care (Signed)
  Problem: Education: Goal: Knowledge of General Education information will improve Description: Including pain rating scale, medication(s)/side effects and non-pharmacologic comfort measures Outcome: Progressing   Problem: Clinical Measurements: Goal: Ability to maintain clinical measurements within normal limits will improve Outcome: Progressing Goal: Will remain free from infection Outcome: Progressing Goal: Diagnostic test results will improve Outcome: Progressing Goal: Respiratory complications will improve Outcome: Progressing Goal: Cardiovascular complication will be avoided Outcome: Progressing   Problem: Activity: Goal: Risk for activity intolerance will decrease Outcome: Progressing   Problem: Nutrition: Goal: Adequate nutrition will be maintained Outcome: Progressing   Problem: Coping: Goal: Level of anxiety will decrease Outcome: Progressing   Problem: Elimination: Goal: Will not experience complications related to bowel motility Outcome: Completed/Met Goal: Will not experience complications related to urinary retention Outcome: Progressing Note: Foley removed today, pt is oliguric, ongoing monitoring, bladder scans q 8 ordered   Problem: Pain Managment: Goal: General experience of comfort will improve Outcome: Progressing   Problem: Safety: Goal: Ability to remain free from injury will improve Outcome: Progressing   Problem: Skin Integrity: Goal: Risk for impaired skin integrity will decrease Outcome: Progressing   Problem: Self-Care: Goal: Ability to participate in self-care as condition permits will improve Outcome: Progressing Goal: Verbalization of feelings and concerns over difficulty with self-care will improve Outcome: Progressing Goal: Ability to communicate needs accurately will improve Outcome: Completed/Met   Problem: Nutrition: Goal: Risk of aspiration will decrease Outcome: Completed/Met Goal: Dietary intake will improve Outcome:  Progressing   Problem: Ischemic Stroke/TIA Tissue Perfusion: Goal: Complications of ischemic stroke/TIA will be minimized Outcome: Progressing   Problem: Education: Goal: Knowledge of disease or condition will improve Outcome: Progressing   Problem: Activity: Goal: Risk for activity intolerance will decrease Outcome: Progressing   Problem: Cardiac: Goal: Will achieve and/or maintain hemodynamic stability Outcome: Progressing   Problem: Clinical Measurements: Goal: Postoperative complications will be avoided or minimized Outcome: Progressing   Problem: Respiratory: Goal: Respiratory status will improve Outcome: Progressing   Problem: Skin Integrity: Goal: Wound healing without signs and symptoms of infection Outcome: Progressing Goal: Risk for impaired skin integrity will decrease Outcome: Progressing   Problem: Urinary Elimination: Goal: Ability to achieve and maintain adequate renal perfusion and functioning will improve Outcome: Not Progressing

## 2020-08-05 NOTE — Progress Notes (Signed)
eLink Physician-Brief Progress Note Patient Name: FILIPPO TONA DOB: [birth year] MRN: [id number]   Date of Service  08/05/2020  HPI/Events of Note  Anemia - Hgb = 6.6.  eICU Interventions  Will transfuse 1 unit PRBC now.     Intervention Category Major Interventions: Other:  Lysle Dingwall 08/05/2020, 4:26 AM

## 2020-08-05 NOTE — Progress Notes (Signed)
TCTS Evening Rounds  Improving Ambulating in hallway Making scant urine today  BP 103/61   Pulse 70   Temp (!) 97.5 F (36.4 C) (Oral)   Resp 18   Ht '6\' 4"'$  (1.93 m)   Wt 123 kg   SpO2 100%   BMI 33.01 kg/m    Intake/Output Summary (Last 24 hours) at 08/05/2020 1805 Last data filed at 08/05/2020 1727 Gross per 24 hour  Intake 1948.94 ml  Output 1515 ml  Net 433.94 ml    A/p:  Continue present management Roshan Roback Z. Orvan Seen, [place]

## 2020-08-05 NOTE — Progress Notes (Signed)
NAME:  Alex [name], MRN:  [id number], DOB:  1981-03-15, LOS: 53 ADMISSION DATE:  07/18/2020, CONSULTATION DATE:  4/26 REFERRING MD:  Dr. Florina Ou, CHIEF COMPLAINT:  AMS   History of Present Illness:  40 yo male smoker brought to [facility] ER with altered mental status.  Noted to have headache and feeling feverish before admission.  Found to have hypotension, AKI, thrombocytopenia, splenomegaly, lactic acidosis, elevated bilirubin, hypoglycemia.  Started on antibiotics and pressors for possible sepsis, but no clear source of infection.  Normal renal function from February 2022.  Uses suboxone for history of substance abuse.  UDS positive for amphetamines.  Transfer to [facility] for further therapy.  Hx from chart, medical team, and pt's mother.  Pertinent  Medical History  Polysubstance Abuse - amphetamines, heroin. On Suboxone.   Ruptured Disc  Significant Hospital Events: Including procedures, antibiotic start and stop dates in addition to other pertinent events   . 4/26 transfer from [facility] to [facility] . CT head 4/26 >> no acute findings . CT abd/pelvis 4/26 >> cholelithiasis w/o inflammatory chagnes, mild hepatomegaly (25 cm), moderate splenomegaly (16.7 cm), 2 mm non obstructing kidney stone on Lt . 4/27 CVL placed. Pressors escalating.  . 4/28 intubated for TEE, CVC, HD, and A-line placed. CRRT started  . Remains on CRRT . MRI on 4/30-multiple infarcts . 5/4: repeat TEE shows an aortic valve abscess . 5/5: extubated , self-removed HD cath so CRRT holiday> developed severe hyperkalemia overnight. . 5/6: new temp HD cath placed  . 5/8: new temp HD cath placed after prior one self-removed by patient . 5/9: Orthopantogram neg for abscess . 5/10: HD cath replaced by IR Perm Cath . 5/12 to OR for AVR, aortic root reconstruction, closure of aortic sinus track. To 2H ICU on vent post op.  4 units PRBC given intraop.  . 5/13 extubated, on CRRT, remains on pressors  Interim History / Subjective:   Alert  oriented following commands on CVVHD off pressors  Objective   Blood pressure 111/61, pulse 68, temperature 97.88 F (36.6 C), resp. rate 13, height '6\' 4"'$  (1.93 m), weight 122.7 kg, SpO2 100 %. PAP: (26-27)/(13) 26/13      Intake/Output Summary (Last 24 hours) at 08/05/2020 0853 Last data filed at 08/05/2020 0700 Gross per 24 hour  Intake 1939.4 ml  Output 1859 ml  Net 80.4 ml   Filed Weights   08/02/20 0349 08/03/20 0300 08/04/20 0300  Weight: 114.9 kg 114.3 kg 122.7 kg    Physical Exam:  General: Obese male, resting in bed head on CVVHD HEENT: Alert following commands no deficit Respiratory: Bilateral breath sounds no wheeze Cardiovascular: Regular rhythm, S1-S2 GI: Obese soft nontender nondistended Extremities: Bilateral dependent lower extremity edema Neuro: Alert oriented following commands  Labs/imaging that I havepersonally reviewed    Sodium 130 Serum creatinine 2.93 Hemoglobin 6.6 status post 1 unit pending White blood cell count 7.5  Resolved Hospital Problem list   Hyponatremia, septic shock, metabolic encephalopathy, acute hypoxic resp failure   Assessment & Plan:   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 Negative orthopantogram. Underwent aortic root reconstruction, and closure of aortic sinus track 5/12 (Dr. Kipp Brood) Plan: Continue cefepime per infectious disease Postop management per T CTS Mediastinal drain in place.  Cardiogenic shock s/p cardiac surgery Plan: Off Levophed at this time. Continue to monitor blood pressures.  Acute hypoxemic respiratory failure s/p cardiac surgery r/t shock.,  Resolved  Acute kidney injury s/p perm  cath 5/10 Hyperkalemia Anuria Likely ischemic ATN in the setting of septic shock and septic emboli.  Requiring CRRT since 4/28, however traumatically pulled out HD line on 5/5 and 5/8 S/p perm cath on 5/10 Plan: Remains on CVVHD Likely to start HD on  Monday per nephrology  Acute anemia Transfused PRBC x 3U total on 5/9 and 5/10. 4 units given 5/12 intra-op. Thrombocytopenia resolved Plan: Follow hemoglobin Transfuse for hemoglobin less than 7  Pain management post op Polysubstance abuse -Suboxone, amphetamines Plan: Oxycodone as needed Lidocaine patches Tramadol as needed  Hyperglycemia Plan: CBGs with SSI  Acute metabolic encephalopathy post op, resolved Agitation, impulsiveness Plan Melatonin nightly Clonazepam  Best practice   Diet:  NPO  Pain/Anxiety/Delirium protocol (if indicated): Yes (RASS goal 0) VAP protocol (if indicated): Yes DVT prophylaxis: Subcutaneous Heparin and SCD GI prophylaxis: PPI Glucose control:  Insulin gtt and SSI No Central venous access:  Yes, and it is still needed Arterial line:  Yes, and it is still needed Foley:  Yes, and it is still needed Mobility:  bed rest  PT consulted: N/A Last date of multidisciplinary goals of care discussion: Family updated Code Status:  full code Disposition: ICU   Garner Nash, DO [facility] 08/05/2020 8:53 AM

## 2020-08-05 NOTE — Progress Notes (Signed)
2 Days Post-Op Procedure(s) (LRB): BENTALL PROCEDURE USING ON-X AORTIC VALVE SIZE 23 MM AND PERI-GUARD 4CM X 4CM PERICARDIUM (N/A) TRANSESOPHAGEAL ECHOCARDIOGRAM (TEE) (N/A) Subjective: Sleeping Objective: Vital signs in last 24 hours: Temp:  [97.7 F (36.5 C)-99.32 F (37.4 C)] 97.7 F (36.5 C) (05/14 0800) Pulse Rate:  [62-103] 62 (05/14 0900) Cardiac Rhythm: Normal sinus rhythm (05/13 2000) Resp:  [12-35] 19 (05/14 0900) BP: (86-125)/(52-80) 117/68 (05/14 0700) SpO2:  [69 %-100 %] 69 % (05/14 0900) Arterial Line BP: (101-131)/(43-69) 105/53 (05/14 0900)  Hemodynamic parameters for last 24 hours:    Intake/Output from previous day: 05/13 0701 - 05/14 0700 In: 2166.9 [P.O.:815; I.V.:526.9; Blood:425; IV Piggyback:400] Out: 2012 [Urine:80; Chest Tube:120] Intake/Output this shift: Total I/O In: 754 [P.O.:360; I.V.:20; Blood:374] Out: 82 [Urine:25; Other:37; Chest Tube:20]  General appearance: no distress Neurologic: intact Heart: regular rate and rhythm, S1, S2 normal, no murmur, click, rub or gallop Lungs: clear to auscultation bilaterally Abdomen: soft, non-tender; bowel sounds normal; no masses,  no organomegaly Extremities: edema mild Wound: c/d/i  Lab Results: Recent Labs    08/04/20 1600 08/05/20 0254  WBC 7.6 7.5  HGB 6.1* 6.6*  HCT 18.3* 19.7*  PLT PLATELET CLUMPS NOTED ON SMEAR, UNABLE TO ESTIMATE 100*   BMET:  Recent Labs    08/04/20 1600 08/05/20 0254  NA 129* 130*  K 4.4 3.9  CL 99 100  CO2 23 24  GLUCOSE 102* 91  BUN 33* 27*  CREATININE 3.22* 2.93*  CALCIUM 7.4* 7.6*    PT/INR:  Recent Labs    08/05/20 0254  LABPROT 18.9*  INR 1.6*   ABG    Component Value Date/Time   PHART 7.405 08/03/2020 2033   HCO3 19.6 (L) 08/03/2020 2033   TCO2 21 (L) 08/03/2020 2033   ACIDBASEDEF 4.0 (H) 08/03/2020 2033   O2SAT 96.0 08/03/2020 2033   CBG (last 3)  Recent Labs    08/05/20 0458 08/05/20 0646 08/05/20 0728  GLUCAP 98 77 84     Assessment/Plan: S/P Procedure(s) (LRB): BENTALL PROCEDURE USING ON-X AORTIC VALVE SIZE 23 MM AND PERI-GUARD 4CM X 4CM PERICARDIUM (N/A) TRANSESOPHAGEAL ECHOCARDIOGRAM (TEE) (N/A) remove chest tubes and foley  Appreciate consultant's assistance   LOS: 18 days    Wonda Olds 08/05/2020

## 2020-08-05 NOTE — Plan of Care (Signed)
  Problem: Education: Goal: Knowledge of disease or condition will improve Outcome: Progressing   Problem: Activity: Goal: Risk for activity intolerance will decrease Outcome: Progressing   Problem: Cardiac: Goal: Will achieve and/or maintain hemodynamic stability Outcome: Progressing   Problem: Clinical Measurements: Goal: Postoperative complications will be avoided or minimized Outcome: Progressing   Problem: Respiratory: Goal: Respiratory status will improve Outcome: Progressing   Problem: Skin Integrity: Goal: Wound healing without signs and symptoms of infection Outcome: Progressing

## 2020-08-05 NOTE — Progress Notes (Signed)
Admit: 07/18/2020 LOS: 3  81M AV IE and perivalvular abscess, serrata sp.   AVR repair 5/12;  Anuric AKI from ATN-  Normal crt in Feb 2022 then 5 on 07/18/20 in the setting of IE   Current CRRT Prescription: Start Date: first RRT4/28 then off and on but resumed 5/12 after surgery Catheter: R IJ Temp HC Cath placed 07/28/20 by CCM BFR: 200-250 Pre Blood Pump: 500 4K DFR: 1500 2K Replacement Rate: 300 4K Goal UF: 45m/h net neg Anticoagulation: none   S:   Relatively stable overnight-  Pressors weaned-  uop only recorded at 80 last 24 hours.  hgb low-  Got another unit of blood this AM-  Total of 2 units last 24 hours   O: 05/13 0701 - 05/14 0700 In: 2156.9 [P.O.:815; I.V.:516.9; Blood:425; IV [id number]: 1939 [Urine:80; Chest Tube:120]   gen-  Sleeping  Lungs-  On vent cv-  RRR abd-  Soft, nd Ext-  No edema   Filed Weights   08/02/20 0349 08/03/20 0300 08/04/20 0300  Weight: 114.9 kg 114.3 kg 122.7 kg    Recent Labs  Lab 08/04/20 0459 08/04/20 1600 08/05/20 0254  NA 131* 129* 130*  K 5.4* 4.4 3.9  CL 102 99 100  CO2 20* 23 24  GLUCOSE 108* 102* 91  BUN 43* 33* 27*  CREATININE 3.95* 3.22* 2.93*  CALCIUM 7.4* 7.4* 7.6*  PHOS 4.3 3.5 4.0   Recent Labs  Lab 08/04/20 0459 08/04/20 1600 08/05/20 0254  WBC 11.9* 7.6 7.5  HGB 6.9* 6.1* 6.6*  HCT 21.0* 18.3* 19.7*  MCV 86.1 85.5 85.7  PLT 137* PLATELET CLUMPS NOTED ON SMEAR, UNABLE TO ESTIMATE 100*    Scheduled Meds: . (feeding supplement) PROSource Plus  30 mL Oral BID BM  . acetaminophen  1,000 mg Oral Q6H   Or  . acetaminophen (TYLENOL) oral liquid 160 mg/5 mL  1,000 mg Per Tube Q6H  . aspirin EC  325 mg Oral Daily   Or  . aspirin  324 mg Per Tube Daily  . bisacodyl  10 mg Oral Daily   Or  . bisacodyl  10 mg Rectal Daily  . Chlorhexidine Gluconate Cloth  6 each Topical Daily  . clonazePAM  0.5 mg Oral BID  . collagenase   Topical Daily  . darbepoetin (ARANESP) injection - NON-DIALYSIS  200  mcg Subcutaneous Q Wed-1800  . docusate sodium  200 mg Oral Daily  . enoxaparin (LOVENOX) injection  40 mg Subcutaneous QHS  . fenofibrate  160 mg Oral Daily  . Gerhardt's butt cream   Topical BID  . insulin aspart  0-24 Units Subcutaneous Q4H  . lidocaine  2 patch Transdermal Q24H  . melatonin  3 mg Oral QHS  . metoprolol tartrate  12.5 mg Oral BID   Or  . metoprolol tartrate  12.5 mg Per Tube BID  . oxyCODONE  10 mg Oral Once  . warfarin  5 mg Oral q1600  . Warfarin - Physician Dosing Inpatient   Does not apply q1600   Continuous Infusions: .  prismasol BGK 4/2.5 500 mL/hr at 08/04/20 1746  .  prismasol BGK 4/2.5 300 mL/hr at 08/05/20 0409  . sodium chloride 10 mL/hr at 08/05/20 0600  .  ceFAZolin (ANCEF) IV Stopped (08/05/20 0531)  . dexmedetomidine (PRECEDEX) IV infusion Stopped (08/04/20 0531)  . DOBUTamine    . lactated ringers    . lactated ringers    . lactated ringers    . magnesium  sulfate    . nitroGLYCERIN Stopped (08/03/20 1601)  . norepinephrine (LEVOPHED) Adult infusion Stopped (08/05/20 0410)  . prismasol BGK 2/2.5 dialysis solution 1,500 mL/hr at 08/05/20 0406   PRN Meds:.sodium chloride, heparin, heparin, lactated ringers, metoprolol tartrate, morphine injection, ondansetron (ZOFRAN) IV, oxyCODONE, QUEtiapine, traMADol  ABG    Component Value Date/Time   PHART 7.405 08/03/2020 2033   PCO2ART 31.4 (L) 08/03/2020 2033   PO2ART 81 (L) 08/03/2020 2033   HCO3 19.6 (L) 08/03/2020 2033   TCO2 21 (L) 08/03/2020 2033   ACIDBASEDEF 4.0 (H) 08/03/2020 2033   O2SAT 96.0 08/03/2020 2033    A/P  1. 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  oligoanuric right now which is not a good sign.  CRRT re initiated s/p surgery on 5/12 with pressor requirement, he is now stabilizing somewhat after surgery-  Pressors off-  Will attempt to take off of CRRT again-  Will not be able to run IHD until Monday am so need to make sure can make it  until then. My preference would be to keep him on CRRT until 7 PM-  Discussed with nursing   2. Hyperkalemia   Has had intermittent issues with hyperK,  Went up after surgery not surprisingly-  Now on 2 k dialysate with  2 k pre and post.  down trending k as of this AM-  Will keep bath the same so he will make it longer in between needing HD support.  Do not replace k  3. Serratia aortic valve endocarditis with perivalvular abscess on cefepime, per ID and T CTS, s/p bentall procedure 5/12.  WBC down 4. VDRF, resolved, normal intermittently on BiPAP--- extubated  5. Anemia, transfusion per CCM-  On aranesp qwed-  Has been a chronic issue-  Needing many transfusions 6. Shock: improving-  Off pressors this AM   Louis Meckel  [employer]

## 2020-08-06 ENCOUNTER — Inpatient Hospital Stay (HOSPITAL_COMMUNITY): Payer: 59

## 2020-08-06 LAB — AEROBIC CULTURE W GRAM STAIN (SUPERFICIAL SPECIMEN): Culture: NO GROWTH

## 2020-08-06 LAB — PROTIME-INR
INR: 3 — ABNORMAL HIGH (ref 0.8–1.2)
Prothrombin Time: 31 seconds — ABNORMAL HIGH (ref 11.4–15.2)

## 2020-08-06 LAB — RENAL FUNCTION PANEL
Albumin: 1.8 g/dL — ABNORMAL LOW (ref 3.5–5.0)
Albumin: 1.8 g/dL — ABNORMAL LOW (ref 3.5–5.0)
Anion gap: 7 (ref 5–15)
Anion gap: 9 (ref 5–15)
BUN: 31 mg/dL — ABNORMAL HIGH (ref 6–20)
BUN: 41 mg/dL — ABNORMAL HIGH (ref 6–20)
CO2: 25 mmol/L (ref 22–32)
CO2: 22 mmol/L (ref 22–32)
Calcium: 7.9 mg/dL — ABNORMAL LOW (ref 8.9–10.3)
Calcium: 7.6 mg/dL — ABNORMAL LOW (ref 8.9–10.3)
Chloride: 99 mmol/L (ref 98–111)
Chloride: 100 mmol/L (ref 98–111)
Creatinine, Ser: 3.53 mg/dL — ABNORMAL HIGH (ref 0.61–1.24)
Creatinine, Ser: 4.56 mg/dL — ABNORMAL HIGH (ref 0.61–1.24)
GFR, Estimated: 22 mL/min — ABNORMAL LOW (ref >60)
GFR, Estimated: 16 mL/min — ABNORMAL LOW (ref >60)
Glucose, Bld: 80 mg/dL (ref 70–99)
Glucose, Bld: 92 mg/dL (ref 70–99)
Phosphorus: 4.8 mg/dL — ABNORMAL HIGH (ref 2.5–4.6)
Phosphorus: 5.4 mg/dL — ABNORMAL HIGH (ref 2.5–4.6)
Potassium: 4.2 mmol/L (ref 3.5–5.1)
Potassium: 4 mmol/L (ref 3.5–5.1)
Sodium: 131 mmol/L — ABNORMAL LOW (ref 135–145)
Sodium: 131 mmol/L — ABNORMAL LOW (ref 135–145)

## 2020-08-06 LAB — GLUCOSE, CAPILLARY
Glucose-Capillary: 74 mg/dL (ref 70–99)
Glucose-Capillary: 94 mg/dL (ref 70–99)

## 2020-08-06 LAB — MAGNESIUM: Magnesium: 2.6 mg/dL — ABNORMAL HIGH (ref 1.7–2.4)

## 2020-08-06 MED ORDER — ASPIRIN EC 81 MG PO TBEC: 81.0000 mg | DELAYED_RELEASE_TABLET | Freq: Every day | ORAL | Status: DC

## 2020-08-06 MED ORDER — SODIUM CHLORIDE 0.9 % IV SOLN
1.0000 g | INTRAVENOUS | Status: DC
  Administered 2020-08-07: 1 g via INTRAVENOUS
  Filled 2020-08-06 (×2): qty 1

## 2020-08-06 MED ORDER — CHLORHEXIDINE GLUCONATE CLOTH 2 % EX PADS: 6.0000 | MEDICATED_PAD | Freq: Every day | CUTANEOUS | Status: DC

## 2020-08-06 MED ORDER — WARFARIN SODIUM 2 MG PO TABS
2.0000 mg | ORAL_TABLET | Freq: Every day | ORAL | Status: DC
  Administered 2020-08-06 – 2020-08-07 (×2): 2 mg via ORAL
  Filled 2020-08-06 (×2): qty 1

## 2020-08-06 NOTE — Significant Event (Signed)
Patient transferred safely to 4E16, taken via wheelchair; VS stable prior to transfer. No personal belongings at bedside of 2H to take to new room. Patient's mother at bedside. Patient assisted to bed by staff. Report given to receiving RN Janett Billow.    Kathaleen Grinder, RN

## 2020-08-06 NOTE — Progress Notes (Signed)
Pharmacy Antibiotic Note  Alex [name] is a 40 y.o. male admitted on 07/18/2020 and found to have Serratia bacteremia with TEE showing endocarditis. Pharmacy has been consulted for Cefepime dosing. 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 ID planning for antibiotics stop date 6 weeks from valve repair. Valve repair occurred on 08/02/20. Patient was started on CRRT which was stopped 5/14 at 1800. Planning for iHD, first session 5/16. Planning for 6 weeks of cefepime starting 5/13 per ID. Of note, patient missed 1 dose of cefepime 5/13.   Plan: - Cefepime 1gm IV q24 hours - Monitor renal function/HD tolerance, tissue culture  Height: '6\' 4"'$  (193 cm) Weight: 124.5 kg (274 lb 7.6 oz) IBW/kg (Calculated) : 86.8  Temp (24hrs), Avg:97.6 F (36.4 C), Min:97.5 F (36.4 C), Max:97.7 F (36.5 C)  Recent Labs  Lab 08/03/20 1604 08/03/20 2037 08/04/20 0459 08/04/20 1600 08/05/20 0254 08/05/20 1623 08/06/20 0419  WBC 22.6* 17.6* 11.9* 7.6 7.5  --   --   CREATININE  --  4.16* 3.95* 3.22* 2.93* 2.55* 3.53*    Estimated Creatinine Clearance: 41.3 mL/min (A) (by C-G formula based on SCr of 3.53 mg/dL (H)).    No Known Allergies  Antimicrobials this admission: Vancomycin 4/26 x 1 Cefepime 4/26 >>  Dose adjustments this admission: 5/5: Cefepime adjusted to 1g IV q24 hours starting 5/6 (received 2g on 5/5) 5/6: Cefepime adjusted to 2g IV q12 hours - CRRT restarted 5/15: Cefepime adjusted to 1g IV q24 hrs - CRRT stopped, planning iHD  Microbiology results: 4/26 Bcx: GNR in 2/2 >> BCID serratia 2/2 4/26 Ucx: insig growth 4/28 Bcx: neg 4/29 MRSA PCR: negative 5/7 bcx: negF 5/12 tissue valve: ngtd  Richardine Service, PharmD, BCPS PGY2 Cardiology Pharmacy Resident Phone: [phone] 08/06/2020  10:26 AM  Please check [email or web address] for unit-specific pharmacy phone numbers.

## 2020-08-06 NOTE — Progress Notes (Signed)
Patient to room 4E16 from [place]. Vital signs obtained. On monitor CCMD notified. CHG bath completed. Call bell within reach. Will continue to monitor.  Era Bumpers, RN

## 2020-08-06 NOTE — Progress Notes (Signed)
3 Days Post-Op Procedure(s) (LRB): BENTALL PROCEDURE USING ON-X AORTIC VALVE SIZE 23 MM AND PERI-GUARD 4CM X 4CM PERICARDIUM (N/A) TRANSESOPHAGEAL ECHOCARDIOGRAM (TEE) (N/A) Subjective: No complaints  Objective: Vital signs in last 24 hours: Temp:  [97.5 F (36.4 C)-97.7 F (36.5 C)] 97.5 F (36.4 C) (05/15 0334) Pulse Rate:  [65-88] 75 (05/15 0900) Cardiac Rhythm: Normal sinus rhythm (05/14 2000) Resp:  [12-23] 12 (05/15 0800) BP: (91-117)/(51-73) 107/68 (05/15 0600) SpO2:  [94 %-100 %] 94 % (05/15 0900) Arterial Line BP: (116-120)/(54-56) 120/56 (05/14 1200) Weight:  [124.5 kg] 124.5 kg (05/15 0600)  Hemodynamic parameters for last 24 hours:    Intake/Output from previous day: 05/14 0701 - 05/15 0700 In: 1798.4 [P.O.:1160; I.V.:64.4; Blood:374; IV Piggyback:200] Out: 641 [Urine:25; Chest Tube:20] Intake/Output this shift: Total I/O In: 90 [P.O.:90] Out: -   General appearance: no distress Neurologic: intact Heart: regular rate and rhythm, S1, S2 normal, no murmur, click, rub or gallop Lungs: clear to auscultation bilaterally Abdomen: soft, non-tender; bowel sounds normal; no masses,  no organomegaly Extremities: extremities normal, atraumatic, no cyanosis or edema Wound: c/d/i  Lab Results: Recent Labs    08/04/20 1600 08/05/20 0254  WBC 7.6 7.5  HGB 6.1* 6.6*  HCT 18.3* 19.7*  PLT PLATELET CLUMPS NOTED ON SMEAR, UNABLE TO ESTIMATE 100*   BMET:  Recent Labs    08/05/20 1623 08/06/20 0419  NA 132* 131*  K 3.4* 4.2  CL 100 99  CO2 25 25  GLUCOSE 103* 80  BUN 23* 31*  CREATININE 2.55* 3.53*  CALCIUM 7.4* 7.9*    PT/INR:  Recent Labs    08/06/20 0419  LABPROT 31.0*  INR 3.0*   ABG    Component Value Date/Time   PHART 7.405 08/03/2020 2033   HCO3 19.6 (L) 08/03/2020 2033   TCO2 21 (L) 08/03/2020 2033   ACIDBASEDEF 4.0 (H) 08/03/2020 2033   O2SAT 96.0 08/03/2020 2033   CBG (last 3)  Recent Labs    08/05/20 1550 08/05/20 2148  08/06/20 0604  GLUCAP 117* 95 74    Assessment/Plan: S/P Procedure(s) (LRB): BENTALL PROCEDURE USING ON-X AORTIC VALVE SIZE 23 MM AND PERI-GUARD 4CM X 4CM PERICARDIUM (N/A) TRANSESOPHAGEAL ECHOCARDIOGRAM (TEE) (N/A) Mobilize Plan for transfer to step-down: see transfer orders  Intermittent HD   LOS: 19 days    Alex [name] 08/06/2020

## 2020-08-06 NOTE — Progress Notes (Signed)
NAME:  Alex [name], MRN:  [id number], DOB:  01-Dec-1980, LOS: 38 ADMISSION DATE:  07/18/2020, CONSULTATION DATE:  4/26 REFERRING MD:  Dr. Florina Ou, CHIEF COMPLAINT:  AMS   History of Present Illness:  40 yo male smoker brought to [facility] ER with altered mental status.  Noted to have headache and feeling feverish before admission.  Found to have hypotension, AKI, thrombocytopenia, splenomegaly, lactic acidosis, elevated bilirubin, hypoglycemia.  Started on antibiotics and pressors for possible sepsis, but no clear source of infection.  Normal renal function from February 2022.  Uses suboxone for history of substance abuse.  UDS positive for amphetamines.  Transfer to [facility] for further therapy.  Hx from chart, medical team, and pt's mother.  Pertinent  Medical History  Polysubstance Abuse - amphetamines, heroin. On Suboxone.   Ruptured Disc  Significant Hospital Events: Including procedures, antibiotic start and stop dates in addition to other pertinent events   . 4/26 transfer from [facility] to [facility] . CT head 4/26 >> no acute findings . CT abd/pelvis 4/26 >> cholelithiasis w/o inflammatory chagnes, mild hepatomegaly (25 cm), moderate splenomegaly (16.7 cm), 2 mm non obstructing kidney stone on Lt . 4/27 CVL placed. Pressors escalating.  . 4/28 intubated for TEE, CVC, HD, and A-line placed. CRRT started  . Remains on CRRT . MRI on 4/30-multiple infarcts . 5/4: repeat TEE shows an aortic valve abscess . 5/5: extubated , self-removed HD cath so CRRT holiday> developed severe hyperkalemia overnight. . 5/6: new temp HD cath placed  . 5/8: new temp HD cath placed after prior one self-removed by patient . 5/9: Orthopantogram neg for abscess . 5/10: HD cath replaced by IR Perm Cath . 5/12 to OR for AVR, aortic root reconstruction, closure of aortic sinus track. To 2H ICU on vent post op.  4 units PRBC given intraop.  . 5/13 extubated, on CRRT, remains on pressors  Interim History / Subjective:   Off  pressors, off cvvhd, resting   Objective   Blood pressure 107/68, pulse 88, temperature (!) 97.5 F (36.4 C), temperature source Oral, resp. rate (!) 23, height '6\' 4"'$  (1.93 m), weight 124.5 kg, SpO2 99 %.        Intake/Output Summary (Last 24 hours) at 08/06/2020 0729 Last data filed at 08/06/2020 0600 Gross per 24 hour  Intake 1798.35 ml  Output 641 ml  Net 1157.35 ml   Filed Weights   08/04/20 0300 08/05/20 0800 08/06/20 0600  Weight: 122.7 kg 123 kg 124.5 kg    Physical Exam:  General: obese male, resting in bed  HEENT: alert following commands, tracking  Respiratory: CTAB  Cardiovascular: RRR, s1 s2  GI: obese, bs, soft, nt nd  Extremities: BL trace edema  Neuro: AAOX4  Labs/imaging that I havepersonally reviewed    Sodium 131 Serum creatinine 3.53 Potassium 4.2  Resolved Hospital Problem list   Hyponatremia, septic shock, metabolic encephalopathy, acute hypoxic resp failure   Assessment & Plan:   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 Negative orthopantogram. Underwent aortic root reconstruction, and closure of aortic sinus track 5/12 (Dr. Kipp Brood) Plan: Continue cefepime per ID Post-op care per TCTS Doing well up ambulating yesterday  Cardiogenic shock s/p cardiac surgery, resolved  Acute hypoxemic respiratory failure s/p cardiac surgery r/t shock.,  Resolved  Acute kidney injury s/p perm cath 5/10 Hyperkalemia Anuria Likely ischemic ATN in the setting of septic shock and septic emboli.  Requiring CRRT since 4/28, however traumatically pulled out  HD line on 5/5 and 5/8 S/p perm cath on 5/10 Plan: Starting iHD per nephrology   Acute anemia Transfused PRBC x 3U total on 5/9 and 5/10. 4 units given 5/12 intra-op. Thrombocytopenia resolved Plan: Conservative transfusion threshold  Continue to observe for any signs of acute bleeding   Pain management post op Polysubstance abuse  -Suboxone, amphetamines Plan: Oxy + lidocaine patches  Hyperglycemia Plan: CBGS + SSI   Acute metabolic encephalopathy post op, resolved Agitation, impulsiveness Plan Melatonin + clonazapam   Patient stable for transfer from the ICU   Best practice   Diet:  NPO  Pain/Anxiety/Delirium protocol (if indicated): Yes (RASS goal 0) VAP protocol (if indicated): Yes DVT prophylaxis: Subcutaneous Heparin and SCD GI prophylaxis: PPI Glucose control:  Insulin gtt and SSI No Central venous access:  Yes, and it is still needed Arterial line:  Yes, and it is still needed Foley:  Yes, and it is still needed Mobility:  bed rest  PT consulted: N/A Last date of multidisciplinary goals of care discussion: Family updated Code Status:  full code Disposition: progressive care    Garner Nash, DO [facility] 08/06/2020 7:29 AM

## 2020-08-06 NOTE — Progress Notes (Signed)
Admit: 07/18/2020 LOS: 24  44M AV IE and perivalvular abscess, serrata sp.   AVR repair 5/12;  Anuric AKI from ATN-  Normal crt in Feb 2022 then 5 on 07/18/20 in the setting of IE  S: taken off of CRRT yesterday-  Also off drips.  BP has been soft but he looks great, cleaned his breakfast tray and is walking in halls-  Essentially no UOP-  BUN and crt rising-     O: 05/14 0701 - 05/15 0700 In: 1798.4 [P.O.:1160; I.V.:64.4; Blood:374; IV Piggyback:200] Out: 641 [Urine:25; Chest Tube:20]   gen-  Awake , alert   Lungs-  On vent cv-  RRR abd-  Soft, nd Ext-  min edema   Filed Weights   08/04/20 0300 08/05/20 0800 08/06/20 0600  Weight: 122.7 kg 123 kg 124.5 kg    Recent Labs  Lab 08/05/20 0254 08/05/20 1623 08/06/20 0419  NA 130* 132* 131*  K 3.9 3.4* 4.2  CL 100 100 99  CO2 '24 25 25  '$ GLUCOSE 91 103* 80  BUN 27* 23* 31*  CREATININE 2.93* 2.55* 3.53*  CALCIUM 7.6* 7.4* 7.9*  PHOS 4.0 3.8 4.8*   Recent Labs  Lab 08/04/20 0459 08/04/20 1600 08/05/20 0254  WBC 11.9* 7.6 7.5  HGB 6.9* 6.1* 6.6*  HCT 21.0* 18.3* 19.7*  MCV 86.1 85.5 85.7  PLT 137* PLATELET CLUMPS NOTED ON SMEAR, UNABLE TO ESTIMATE 100*    Scheduled Meds: . (feeding supplement) PROSource Plus  30 mL Oral BID BM  . acetaminophen  1,000 mg Oral Q6H   Or  . acetaminophen (TYLENOL) oral liquid 160 mg/5 mL  1,000 mg Per Tube Q6H  . aspirin EC  325 mg Oral Daily   Or  . aspirin  324 mg Per Tube Daily  . bisacodyl  10 mg Oral Daily   Or  . bisacodyl  10 mg Rectal Daily  . Chlorhexidine Gluconate Cloth  6 each Topical Daily  . clonazePAM  0.5 mg Oral BID  . collagenase   Topical Daily  . darbepoetin (ARANESP) injection - NON-DIALYSIS  200 mcg Subcutaneous Q Wed-1800  . docusate sodium  200 mg Oral Daily  . enoxaparin (LOVENOX) injection  40 mg Subcutaneous QHS  . fenofibrate  160 mg Oral Daily  . Gerhardt's butt cream   Topical BID  . insulin aspart  0-24 Units Subcutaneous TID WC  . lidocaine  2  patch Transdermal Q24H  . melatonin  3 mg Oral QHS  . metoprolol tartrate  12.5 mg Oral BID   Or  . metoprolol tartrate  12.5 mg Per Tube BID  . oxyCODONE  10 mg Oral Once  . warfarin  5 mg Oral q1600  . Warfarin - Physician Dosing Inpatient   Does not apply q1600   Continuous Infusions: .  prismasol BGK 4/2.5 500 mL/hr at 08/05/20 1059  .  prismasol BGK 4/2.5 Stopped (08/05/20 1830)  . sodium chloride Stopped (08/05/20 2346)  . ceFEPime (MAXIPIME) IV Stopped (08/05/20 2244)  . dexmedetomidine (PRECEDEX) IV infusion Stopped (08/04/20 0531)  . DOBUTamine    . nitroGLYCERIN Stopped (08/03/20 1601)  . norepinephrine (LEVOPHED) Adult infusion Stopped (08/05/20 0410)  . prismasol BGK 2/2.5 dialysis solution Stopped (08/05/20 1830)   PRN Meds:.sodium chloride, heparin, heparin, metoprolol tartrate, morphine injection, ondansetron (ZOFRAN) IV, oxyCODONE, QUEtiapine, traMADol  ABG    Component Value Date/Time   PHART 7.405 08/03/2020 2033   PCO2ART 31.4 (L) 08/03/2020 2033   PO2ART 81 (L) 08/03/2020 2033  HCO3 19.6 (L) 08/03/2020 2033   TCO2 21 (L) 08/03/2020 2033   ACIDBASEDEF 4.0 (H) 08/03/2020 2033   O2SAT 96.0 08/03/2020 2033    A/P  1. Dialysis dependent anuric AKI off and on CRRT/RRT since 4/28-  crt was normal in Feb 2022.  oligoanuric right now which is not a good sign.  CRRT re initiated s/p surgery on 5/12- stopped on 5/14.  Will plan for IHD tomorrow. Should be OK to go to HD unit, will order for AM- hopefully his K will be OK.  Will need to make plans for AKI outside of the hospital just in case does not make moves toward renal recovery before discharge 2. Hyperkalemia   Has had intermittent issues with hyperK,  Went up after surgery not surprisingly-  Went from 3.4 to 4.2 in 24 hours but did get blood-  IHD planned for tomorrow AM 3. 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  WBC down 4. VDRF,  resolved, normal intermittently on BiPAP--- extubated  5. Anemia, transfusion per CCM-  On aranesp qwed-  Has been a chronic issue-  Needing many transfusions    Louis Meckel  [employer]

## 2020-08-06 NOTE — Progress Notes (Signed)
Mobility Specialist - Progress Note   08/06/20 1648  Mobility  Activity Transferred:  Chair to bed  Level of Assistance Moderate assist, patient does 50-74%  Assistive Device None  Mobility Ambulated with assistance in room  Mobility Response Tolerated well  Mobility performed by Mobility specialist  $Mobility charge 1 Mobility   Pt unable to stand from wheelchair when transferred to unit. Pt guided through proper biomechanics for standing and was mod assist to power up from chair. Pt sitting on edge of bed to finish being checked in.  Pricilla Handler Mobility Specialist Mobility Specialist Phone: [phone]

## 2020-08-07 LAB — TYPE AND SCREEN
ABO/RH(D): O NEG
Antibody Screen: NEGATIVE
Unit division: 0
Unit division: 0
Unit division: 0
Unit division: 0
Unit division: 0
Unit division: 0
Unit division: 0
Unit division: 0
Unit division: 0
Unit division: 0
Unit division: 0
Unit division: 0

## 2020-08-07 LAB — BPAM RBC
Blood Product Expiration Date: 202205162359
Blood Product Expiration Date: 202205192359
Blood Product Expiration Date: 202205252359
Blood Product Expiration Date: 202205252359
Blood Product Expiration Date: 202206032359
Blood Product Expiration Date: 202206052359
Blood Product Expiration Date: 202206052359
Blood Product Expiration Date: 202206052359
Blood Product Expiration Date: 202206062359
Blood Product Expiration Date: 202206062359
Blood Product Expiration Date: 202206062359
Blood Product Expiration Date: 202206062359
ISSUE DATE / TIME: 202205120723
ISSUE DATE / TIME: 202205120723
ISSUE DATE / TIME: 202205120904
ISSUE DATE / TIME: 202205120904
ISSUE DATE / TIME: 202205121027
ISSUE DATE / TIME: 202205121027
ISSUE DATE / TIME: 202205131930
ISSUE DATE / TIME: 202205140446
Unit Type and Rh: 5100
Unit Type and Rh: 5100
Unit Type and Rh: 5100
Unit Type and Rh: 5100
Unit Type and Rh: 5100
Unit Type and Rh: 5100
Unit Type and Rh: 5100
Unit Type and Rh: 5100
Unit Type and Rh: 9500
Unit Type and Rh: 9500
Unit Type and Rh: 9500
Unit Type and Rh: 9500

## 2020-08-07 LAB — CBC
HCT: 20.7 % — ABNORMAL LOW (ref 39.0–52.0)
Hemoglobin: 6.6 g/dL — CL (ref 13.0–17.0)
MCH: 28.2 pg (ref 26.0–34.0)
MCHC: 31.9 g/dL (ref 30.0–36.0)
MCV: 88.5 fL (ref 80.0–100.0)
Platelets: 149 10*3/uL — ABNORMAL LOW (ref 150–400)
RBC: 2.34 MIL/uL — ABNORMAL LOW (ref 4.22–5.81)
RDW: 16.4 % — ABNORMAL HIGH (ref 11.5–15.5)
WBC: 7.3 10*3/uL (ref 4.0–10.5)
nRBC: 0 % (ref 0.0–0.2)

## 2020-08-07 LAB — HEPATITIS B SURFACE ANTIBODY,QUALITATIVE: Hep B S Ab: REACTIVE — AB

## 2020-08-07 LAB — RENAL FUNCTION PANEL
Albumin: 1.8 g/dL — ABNORMAL LOW (ref 3.5–5.0)
Albumin: 1.9 g/dL — ABNORMAL LOW (ref 3.5–5.0)
Anion gap: 8 (ref 5–15)
Anion gap: 7 (ref 5–15)
BUN: 46 mg/dL — ABNORMAL HIGH (ref 6–20)
BUN: 22 mg/dL — ABNORMAL HIGH (ref 6–20)
CO2: 22 mmol/L (ref 22–32)
CO2: 26 mmol/L (ref 22–32)
Calcium: 7.7 mg/dL — ABNORMAL LOW (ref 8.9–10.3)
Calcium: 7.6 mg/dL — ABNORMAL LOW (ref 8.9–10.3)
Chloride: 99 mmol/L (ref 98–111)
Chloride: 100 mmol/L (ref 98–111)
Creatinine, Ser: 4.96 mg/dL — ABNORMAL HIGH (ref 0.61–1.24)
Creatinine, Ser: 3.25 mg/dL — ABNORMAL HIGH (ref 0.61–1.24)
GFR, Estimated: 15 mL/min — ABNORMAL LOW (ref >60)
GFR, Estimated: 24 mL/min — ABNORMAL LOW (ref >60)
Glucose, Bld: 74 mg/dL (ref 70–99)
Glucose, Bld: 106 mg/dL — ABNORMAL HIGH (ref 70–99)
Phosphorus: 6.1 mg/dL — ABNORMAL HIGH (ref 2.5–4.6)
Phosphorus: 3.2 mg/dL (ref 2.5–4.6)
Potassium: 4.2 mmol/L (ref 3.5–5.1)
Potassium: 4.4 mmol/L (ref 3.5–5.1)
Sodium: 129 mmol/L — ABNORMAL LOW (ref 135–145)
Sodium: 133 mmol/L — ABNORMAL LOW (ref 135–145)

## 2020-08-07 LAB — HEMOGLOBIN AND HEMATOCRIT, BLOOD
HCT: 25.2 % — ABNORMAL LOW (ref 39.0–52.0)
Hemoglobin: 8.4 g/dL — ABNORMAL LOW (ref 13.0–17.0)

## 2020-08-07 LAB — PROTIME-INR
INR: 3.9 — ABNORMAL HIGH (ref 0.8–1.2)
Prothrombin Time: 38.5 seconds — ABNORMAL HIGH (ref 11.4–15.2)

## 2020-08-07 LAB — HEPATITIS B SURFACE ANTIGEN: Hepatitis B Surface Ag: NONREACTIVE

## 2020-08-07 LAB — MAGNESIUM: Magnesium: 2.7 mg/dL — ABNORMAL HIGH (ref 1.7–2.4)

## 2020-08-07 LAB — PREPARE RBC (CROSSMATCH)

## 2020-08-07 LAB — HEPATITIS B CORE ANTIBODY, IGM: Hep B C IgM: NONREACTIVE

## 2020-08-07 MED ORDER — LIDOCAINE-PRILOCAINE 2.5-2.5 % EX CREA: 1.0000 "application " | TOPICAL_CREAM | CUTANEOUS | Status: DC | PRN

## 2020-08-07 MED ORDER — PENTAFLUOROPROP-TETRAFLUOROETH EX AERO: 1.0000 "application " | INHALATION_SPRAY | CUTANEOUS | Status: DC | PRN

## 2020-08-07 MED ORDER — SODIUM CHLORIDE 0.9 % IV SOLN: 100.0000 mL | INTRAVENOUS | Status: DC | PRN

## 2020-08-07 MED ORDER — SODIUM CHLORIDE 0.9% IV SOLUTION: Freq: Once | INTRAVENOUS | Status: DC

## 2020-08-07 MED ORDER — ALTEPLASE 2 MG IJ SOLR: 2.0000 mg | Freq: Once | INTRAMUSCULAR | Status: DC | PRN

## 2020-08-07 MED ORDER — OXYCODONE HCL 5 MG PO TABS
ORAL_TABLET | ORAL | Status: AC
  Filled 2020-08-07: qty 2

## 2020-08-07 MED ORDER — HEPARIN SODIUM (PORCINE) 1000 UNIT/ML DIALYSIS
1000.0000 [IU] | INTRAMUSCULAR | Status: DC | PRN
  Administered 2020-08-07: 3200 [IU] via INTRAVENOUS_CENTRAL

## 2020-08-07 MED ORDER — LIDOCAINE HCL (PF) 1 % IJ SOLN: 5.0000 mL | INTRAMUSCULAR | Status: DC | PRN

## 2020-08-07 NOTE — Procedures (Signed)
I was present at this dialysis session. I have reviewed the session itself and made appropriate changes. Tolerating well when I saw him.  Filed Weights   08/06/20 0600 08/07/20 0348 08/07/20 0839  Weight: 124.5 kg 125.7 kg 125.7 kg    Recent Labs  Lab 08/07/20 0153  NA 129*  K 4.2  CL 99  CO2 22  GLUCOSE 74  BUN 46*  CREATININE 4.96*  CALCIUM 7.7*  PHOS 6.1*    Recent Labs  Lab 08/04/20 1600 08/05/20 0254 08/07/20 0153  WBC 7.6 7.5 7.3  HGB 6.1* 6.6* 6.6*  HCT 18.3* 19.7* 20.7*  MCV 85.5 85.7 88.5  PLT PLATELET CLUMPS NOTED ON SMEAR, UNABLE TO ESTIMATE 100* 149*    Scheduled Meds: . (feeding supplement) PROSource Plus  30 mL Oral BID BM  . sodium chloride   Intravenous Once  . acetaminophen  1,000 mg Oral Q6H   Or  . acetaminophen (TYLENOL) oral liquid 160 mg/5 mL  1,000 mg Per Tube Q6H  . aspirin EC  81 mg Oral Daily  . bisacodyl  10 mg Oral Daily   Or  . bisacodyl  10 mg Rectal Daily  . Chlorhexidine Gluconate Cloth  6 each Topical Daily  . clonazePAM  0.5 mg Oral BID  . collagenase   Topical Daily  . darbepoetin (ARANESP) injection - NON-DIALYSIS  200 mcg Subcutaneous Q Wed-1800  . docusate sodium  200 mg Oral Daily  . fenofibrate  160 mg Oral Daily  . Gerhardt's butt cream   Topical BID  . lidocaine  2 patch Transdermal Q24H  . melatonin  3 mg Oral QHS  . metoprolol tartrate  12.5 mg Oral BID   Or  . metoprolol tartrate  12.5 mg Per Tube BID  . warfarin  2 mg Oral q1600  . Warfarin - Physician Dosing Inpatient   Does not apply q1600   Continuous Infusions: . sodium chloride    . sodium chloride    . ceFEPime (MAXIPIME) IV     PRN Meds:.sodium chloride, sodium chloride, alteplase, heparin, lidocaine (PF), lidocaine-prilocaine, metoprolol tartrate, morphine injection, ondansetron (ZOFRAN) IV, oxyCODONE, pentafluoroprop-tetrafluoroeth, QUEtiapine, traMADol   Santiago Bumpers,  MD 08/07/2020, 11:22 AM

## 2020-08-07 NOTE — Progress Notes (Signed)
Admit: 07/18/2020 LOS: 20  23M AV IE and perivalvular abscess, serrata sp.   AVR repair 5/12;  Anuric AKI from ATN-  Normal crt in Feb 2022 then 5 on 07/18/20 in the setting of IE  S: Patient tired today but overall feeling pretty good.  States that he urinated for the first time in a while this morning.  States "it was like a normal morning pee."  But did state that it was dark.  Denies shortness of breath or chest pain.  O: 05/15 0701 - 05/16 0700 In: 324.1 [P.O.:310; I.V.:14.1] Out: -    Vitals:   08/07/20 1030 08/07/20 1100  BP: 115/67 123/70  Pulse: 86 83  Resp:    Temp:    SpO2:       GEN: Lying in bed, no distress ENT: no nasal discharge, mmm, poor dentition EYES: no scleral icterus, eomi CV: normal rate,  PULM: no iwob, bilateral chest rise ABD: NABS, non-distended SKIN: no rashes or jaundice EXT: 2+ pitting edema in the bilateral lower extremities, warm and well perfused   Filed Weights   08/06/20 0600 08/07/20 0348 08/07/20 0839  Weight: 124.5 kg 125.7 kg 125.7 kg    Recent Labs  Lab 08/06/20 0419 08/06/20 1605 08/07/20 0153  NA 131* 131* 129*  K 4.2 4.0 4.2  CL 99 100 99  CO2 '25 22 22  '$ GLUCOSE 80 92 74  BUN 31* 41* 46*  CREATININE 3.53* 4.56* 4.96*  CALCIUM 7.9* 7.6* 7.7*  PHOS 4.8* 5.4* 6.1*   Recent Labs  Lab 08/04/20 1600 08/05/20 0254 08/07/20 0153  WBC 7.6 7.5 7.3  HGB 6.1* 6.6* 6.6*  HCT 18.3* 19.7* 20.7*  MCV 85.5 85.7 88.5  PLT PLATELET CLUMPS NOTED ON SMEAR, UNABLE TO ESTIMATE 100* 149*    Scheduled Meds: . (feeding supplement) PROSource Plus  30 mL Oral BID BM  . sodium chloride   Intravenous Once  . acetaminophen  1,000 mg Oral Q6H   Or  . acetaminophen (TYLENOL) oral liquid 160 mg/5 mL  1,000 mg Per Tube Q6H  . aspirin EC  81 mg Oral Daily  . bisacodyl  10 mg Oral Daily   Or  . bisacodyl  10 mg Rectal Daily  . Chlorhexidine Gluconate Cloth  6 each Topical Daily  . clonazePAM  0.5 mg Oral BID  . collagenase   Topical  Daily  . darbepoetin (ARANESP) injection - NON-DIALYSIS  200 mcg Subcutaneous Q Wed-1800  . docusate sodium  200 mg Oral Daily  . fenofibrate  160 mg Oral Daily  . Gerhardt's butt cream   Topical BID  . lidocaine  2 patch Transdermal Q24H  . melatonin  3 mg Oral QHS  . metoprolol tartrate  12.5 mg Oral BID   Or  . metoprolol tartrate  12.5 mg Per Tube BID  . warfarin  2 mg Oral q1600  . Warfarin - Physician Dosing Inpatient   Does not apply q1600   Continuous Infusions: . sodium chloride    . sodium chloride    . ceFEPime (MAXIPIME) IV     PRN Meds:.sodium chloride, sodium chloride, alteplase, heparin, lidocaine (PF), lidocaine-prilocaine, metoprolol tartrate, morphine injection, ondansetron (ZOFRAN) IV, oxyCODONE, pentafluoroprop-tetrafluoroeth, QUEtiapine, traMADol  ABG    Component Value Date/Time   PHART 7.405 08/03/2020 2033   PCO2ART 31.4 (L) 08/03/2020 2033   PO2ART 81 (L) 08/03/2020 2033   HCO3 19.6 (L) 08/03/2020 2033   TCO2 21 (L) 08/03/2020 2033   ACIDBASEDEF 4.0 (H) 08/03/2020  2033   O2SAT 96.0 08/03/2020 2033    A/P  1. Dialysis dependent anuric AKI off and on CRRT/RRT since 4/28-  crt was normal in Feb 2022.   CRRT re initiated s/p surgery on 5/12- stopped on 5/14.  Continue plans of IHD today.  Likely maintain MWF schedule.  Some urine output today.  Asked that he collected so we can monitor for improvement.  Hold off on clip process for few more days. 2. Hyperkalemia   intermittent issue.  Potassium good today.  Possibly this is a sign of his improving kidney function 3. 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  WBC down 4. VDRF, resolved, normal intermittently on BiPAP--- extubated  5. Anemia, transfusion per CCM-  On aranesp qwed-  Has been a chronic issue-  Needing many transfusions.  Hemoglobin 6.6 today. 6. Hyponatremia: Sodium 129 likely related to fluid excess.  Management with  hemodialysis    [facility]

## 2020-08-07 NOTE — Progress Notes (Signed)
     [address] City[address]       [place], [address]             [phone]        No events.  Today's Vitals   08/07/20 1100 08/07/20 1130 08/07/20 1200 08/07/20 1230  BP: 123/70 (!) 91/51 113/74 (!) 106/59  Pulse: 83 78 85 84  Resp:      Temp:      TempSrc:      SpO2:      Weight:      Height:      PainSc:       Body mass index is 33.73 kg/m. Alert, NAD Sinus Easy work of breathing  CBC Latest Ref Rng & Units 08/07/2020 08/05/2020 08/04/2020  WBC 4.0 - 10.5 K/uL 7.3 7.5 7.6  Hemoglobin 13.0 - 17.0 g/dL 6.6(LL) 6.6(LL) 6.1(LL)  Hematocrit 39.0 - 52.0 % 20.7(L) 19.7(L) 18.3(L)  Platelets 150 - 400 K/uL 149(L) 100(L) PLATELET CLUMPS NOTED ON SMEAR, UNABLE TO ESTIMATE   BMP Latest Ref Rng & Units 08/07/2020 08/06/2020 08/06/2020  Glucose 70 - 99 mg/dL 74 92 80  BUN 6 - 20 mg/dL 46(H) 41(H) 31(H)  Creatinine 0.61 - 1.24 mg/dL 4.96(H) 4.56(H) 3.53(H)  Sodium 135 - 145 mmol/L 129(L) 131(L) 131(L)  Potassium 3.5 - 5.1 mmol/L 4.2 4.0 4.2  Chloride 98 - 111 mmol/L 99 100 99  CO2 22 - 32 mmol/L '22 22 25  '$ Calcium 8.9 - 10.3 mg/dL 7.7(L) 7.6(L) 7.9(L)   INR: 49.45  40 year old male with history of aortic valve endocarditis with root abscess..  Status post annular reconstruction, and 23 mm On-X valve placement.  INR was supratherapeutic thus Coumadin is being decreased. Creatinine is stable, and patient is on intermittent hemodialysis. Continue rehab Continue antibiotics.  Ala Kratz Bary Leriche

## 2020-08-07 NOTE — Progress Notes (Signed)
Spoke with RN. HD made pt quite tired today. Will allow him to rest (in bed now) for now and will f/u tomorrow. Yves Dill CES, ACSM 2:53 PM 08/07/2020

## 2020-08-07 NOTE — Progress Notes (Signed)
TCTS BRIEF PROGRESS NOTE  4 Days Post-Op  S/P Procedure(s) (LRB): BENTALL PROCEDURE USING ON-X AORTIC VALVE SIZE 23 MM AND PERI-GUARD 4CM X 4CM PERICARDIUM (N/A) TRANSESOPHAGEAL ECHOCARDIOGRAM (TEE) (N/A)   Called by patient's nurse to inform me that the patient had decided to leave the hospital against medical advice.  I was able to speak with the patient briefly over the telephone but was unable to convince him that leaving was both unwise and potentially very dangerous without arrangements for his postoperative care and prescription medications, including both antibiotics and warfarin for his new heart valve.  He expressed no ability to understand his clinical condition nor the potential danger associated with leaving the hospital under such circumstances.  He hung up the phone and returned it to his nurse.  Rexene Alberts, MD 08/07/2020 9:56 PM

## 2020-08-07 NOTE — Progress Notes (Signed)
Notified by patient's nurse that patient is leaving AMA. Dr. Roxy Manns and Dr. Sidney Ace (hospitalist on call) notified by Danne Baxter RN. Per Dr. Guy Sandifer request hospital [facility] Jenny Reichmann was notified by phone. John stated that as long as  patient is alert and oriented to have him sign AMA form and he can leave. MD and [facility] aware that patient has HD cath in place at time of discharge. Observed patient ambulating independently as he left the floor. Patient has contacted family by phone for transportation.

## 2020-08-07 NOTE — Progress Notes (Signed)
Mobility Specialist: Progress Note   08/07/20 1431  Mobility  Activity Transferred:  Bed to chair  Level of Assistance Moderate assist, patient does 50-74%  Assistive Device None  Distance Ambulated (ft) 2 ft  Mobility Out of bed to chair with meals  Mobility Response Tolerated well  Mobility performed by Mobility specialist  Bed Position Chair  $Mobility charge 1 Mobility   Post-Mobility: 103 HR  Pt was modA to stand from EOB. Pt has call bell at his side with family member present in room.  [facility] Thelia Tanksley Mobility Specialist Mobility Specialist Phone: [phone]

## 2020-08-07 NOTE — Progress Notes (Signed)
PROGRESS NOTE    Alex [name]   [id number]  DOB: 1980-08-03  DOA: 07/18/2020 PCP: [facility]   Brief Narrative:  Alex [name] is a 40 year old male with polysubstance abuse (nicotine, heroin and methamphetamine) on Suboxone who presented to the ED at [facility] on 4/26 with altered mental status preceded by being feverish, back pain and headache.   In the ED he was hypotensive with a creatinine of 5.37, lactic acid of 2.6, hypoglycemia, platelets of 30, WBC 14.9 and T bilirubin of 5.4 he was given IM Geodon in the ED and he was started on antibiotics and pressors.  He was transferred to the [facility], ICU.  4/26 transfer from [facility] to [facility]  CT head 4/26 >> no acute findings  CT abd/pelvis 4/26 >> cholelithiasis w/o inflammatory chagnes, mild hepatomegaly (25 cm), moderate splenomegaly (16.7 cm), 2 mm non obstructing kidney stone on Lt  4/27 CVL placed. Pressors escalating.   4/28 intubated for TEE, CVC, HD, and A-line placed. CRRT started   Remains on CRRT  MRI on 4/30-multiple infarcts  5/4: repeat TEE shows an aortic valve abscess  5/5: extubated , self-removed HD cath so CRRT holiday> developed severe hyperkalemia overnight.  5/6: new temp HD cath placed   5/8: new temp HD cath placed after prior one self-removed by patient  5/9: Orthopantogram neg for abscess  5/10: HD cath replaced by IR [facility]  5/12 to OR for AVR, aortic root reconstruction, closure of aortic sinus track. To 2H ICU on vent post op.  4 units PRBC given intraop.   5/13 extubated, on CRRT, remains on pressors    Subjective: No complaints today    Assessment & Plan:   Principal Problem:   Aortic valve endocarditis with perivalvular abscess, Serratia bacteremia and septic emboli causing strokes -MRI brain 07/22/20 revealed dozens of primary punctate acute infarcts scattered throughout the cerebellum and both cerebral hemispheres-  several  are associated with petechial blood products -Status post mechanical aortic valve replacement, aortic root reconstruction and closure of aortic sinus track -Needs 6 weeks of cefepime per ID with start date being 5/13 -Continue Coumadin per pharmacy to keep INR 2.5- 3.5 -He is able to ambulate without difficulty  Active Problems: Cardiogenic shock, elevated total bilirubin and acute hypoxemic respiratory failure requiring intubation - Secondary to above  AKI, electrolyte abnormalities - Suspected to be secondary to ATN related to septic shock - Required CRRT which was started on 4/28 - He pulled out his HD catheter on 5/5 and 5/8 - He continues to require hemodialysis Monday Wednesday Friday- beginning to urinate  Anemia/thrombocytopenia secondary to acute infection - Status post 6 units of PRBC -Hemoglobin still 6.6 today-transfuse another unit of packed red blood cells today  - Thrombocytopenia steadily improving    Amphetamine abuse (HCC)   Nicotine dependence, cigarettes, uncomplicated   Opioid dependence on agonist therapy  -Continue Suboxone  Acute metabolic encephalopathy - Due to acute infection- improved    Time spent in minutes: 35 DVT prophylaxis: SCDs Start: 08/04/20 0855 warfarin (COUMADIN) tablet 2 mg    Code Status: Full code Family Communication:  Level of Care: Level of care: Progressive Disposition Plan:  Status is: Inpatient  Remains inpatient appropriate because:IV treatments appropriate due to intensity of illness or inability to take PO   Dispo: The patient is from: Home              Anticipated d/c is to: Home  Patient currently is not medically stable to d/c.   Difficult to place patient No      Consultants:   Pulmonary critical care  ID  Nephrology  CT surgery  Neurology  Interventional radiology Procedures:   Intubation/extubation  TEE  Placement of hemodialysis catheters  mechanical aortic valve  replacement, aortic root reconstruction and closure of aortic sinus track   Antimicrobials:  Anti-infectives (From admission, onward)   Start     Dose/Rate Route Frequency Ordered Stop   08/07/20 1200  ceFEPIme (MAXIPIME) 1 g in sodium chloride 0.9 % 100 mL IVPB        1 g 200 mL/hr over 30 Minutes Intravenous Every 24 hours 08/06/20 1027     08/05/20 1000  ceFEPIme (MAXIPIME) 2 g in sodium chloride 0.9 % 100 mL IVPB  Status:  Discontinued        2 g 200 mL/hr over 30 Minutes Intravenous Every 12 hours 08/05/20 0914 08/06/20 1027   08/03/20 2100  vancomycin (VANCOCIN) IVPB 1000 mg/200 mL premix        1,000 mg 200 mL/hr over 60 Minutes Intravenous  Once 08/03/20 1521 08/03/20 2155   08/03/20 2000  ceFAZolin (ANCEF) IVPB 2g/100 mL premix  Status:  Discontinued        2 g 200 mL/hr over 30 Minutes Intravenous Every 8 hours 08/03/20 1521 08/05/20 0914   08/03/20 1000  ceFEPIme (MAXIPIME) 2 g in sodium chloride 0.9 % 100 mL IVPB  Status:  Discontinued        2 g 200 mL/hr over 30 Minutes Intravenous Every 12 hours 08/02/20 1122 08/04/20 1829   08/03/20 0400  vancomycin (VANCOREADY) IVPB 1500 mg/300 mL  Status:  Discontinued        1,500 mg 150 mL/hr over 120 Minutes Intravenous To Surgery 08/02/20 1104 08/03/20 0718   08/03/20 0400  ceFAZolin (ANCEF) IVPB 2g/100 mL premix  Status:  Discontinued        2 g 200 mL/hr over 30 Minutes Intravenous To Surgery 08/02/20 1104 08/03/20 0718   08/03/20 0400  ceFAZolin (ANCEF) IVPB 2g/100 mL premix  Status:  Discontinued        2 g 200 mL/hr over 30 Minutes Intravenous To Surgery 08/02/20 1104 08/03/20 0718   08/03/20 0100  ceFEPIme (MAXIPIME) 2 g in sodium chloride 0.9 % 100 mL IVPB        2 g 200 mL/hr over 30 Minutes Intravenous  Once 08/02/20 1122 08/03/20 0235   08/01/20 0000  ceFAZolin (ANCEF) IVPB 2g/100 mL premix        2 g 200 mL/hr over 30 Minutes Intravenous To Radiology 07/31/20 1234 08/01/20 1509   07/31/20 0000  vancomycin  (VANCOREADY) IVPB 1500 mg/300 mL  Status:  Discontinued        1,500 mg 150 mL/hr over 120 Minutes Intravenous To Surgery 07/28/20 1111 07/31/20 1256   07/31/20 0000  ceFAZolin (ANCEF) IVPB 2g/100 mL premix  Status:  Discontinued        2 g 200 mL/hr over 30 Minutes Intravenous To Surgery 07/28/20 1111 07/31/20 1255   07/31/20 0000  ceFAZolin (ANCEF) IVPB 2g/100 mL premix  Status:  Discontinued        2 g 200 mL/hr over 30 Minutes Intravenous To Surgery 07/28/20 1111 07/31/20 1255   07/28/20 1015  ceFEPIme (MAXIPIME) 2 g in sodium chloride 0.9 % 100 mL IVPB  Status:  Discontinued        2 g 200 mL/hr over 30  Minutes Intravenous Every 12 hours 07/28/20 0916 08/02/20 1121   07/28/20 1000  ceFEPIme (MAXIPIME) 1 g in sodium chloride 0.9 % 100 mL IVPB  Status:  Discontinued        1 g 200 mL/hr over 30 Minutes Intravenous Every 24 hours 07/27/20 0914 07/28/20 0916   07/21/20 0600  ceFEPIme (MAXIPIME) 2 g in sodium chloride 0.9 % 100 mL IVPB  Status:  Discontinued        2 g 200 mL/hr over 30 Minutes Intravenous Every 12 hours 07/20/20 2009 07/27/20 0914   07/20/20 0500  ceFEPIme (MAXIPIME) 2 g in sodium chloride 0.9 % 100 mL IVPB  Status:  Discontinued        2 g 200 mL/hr over 30 Minutes Intravenous Every 24 hours 07/19/20 1204 07/20/20 2009   07/20/20 0400  vancomycin (VANCOREADY) IVPB 1750 mg/350 mL  Status:  Discontinued        1,750 mg 175 mL/hr over 120 Minutes Intravenous Every 48 hours 07/18/20 1219 07/19/20 0644   07/19/20 0644  vancomycin variable dose per unstable renal function (pharmacist dosing)  Status:  Discontinued         Does not apply See admin instructions 07/19/20 0644 07/19/20 0826   07/18/20 1700  ceFEPIme (MAXIPIME) 2 g in sodium chloride 0.9 % 100 mL IVPB  Status:  Discontinued        2 g 200 mL/hr over 30 Minutes Intravenous Every 12 hours 07/18/20 1348 07/19/20 1204   07/18/20 0415  ceFEPIme (MAXIPIME) 2 g in sodium chloride 0.9 % 100 mL IVPB        2 g 200 mL/hr  over 30 Minutes Intravenous  Once 07/18/20 0401 07/18/20 0553   07/18/20 0415  vancomycin (VANCOREADY) IVPB 2000 mg/400 mL        2,000 mg 200 mL/hr over 120 Minutes Intravenous  Once 07/18/20 0407 07/18/20 0658       Objective: Vitals:   08/07/20 1200 08/07/20 1230 08/07/20 1243 08/07/20 1551  BP: 113/74 (!) 106/59 123/62 117/69  Pulse: 85 84 81 95  Resp:   18 20  Temp:   (!) 97.3 F (36.3 C) 97.6 F (36.4 C)  TempSrc:   Oral Oral  SpO2:   99% 100%  Weight:   124.5 kg   Height:        Intake/Output Summary (Last 24 hours) at 08/07/2020 1719 Last data filed at 08/07/2020 1600 Gross per 24 hour  Intake 470 ml  Output 1000 ml  Net -530 ml   Filed Weights   08/07/20 0348 08/07/20 0839 08/07/20 1243  Weight: 125.7 kg 125.7 kg 124.5 kg    Examination: General exam: Appears comfortable  HEENT: PERRLA, oral mucosa moist, no sclera icterus or thrush Respiratory system: Clear to auscultation. Respiratory effort normal. Cardiovascular system: S1 & S2 heard, RRR.   Gastrointestinal system: Abdomen soft, non-tender, nondistended. Normal bowel sounds. Central nervous system: Alert and oriented. No focal neurological deficits. Extremities: No cyanosis, clubbing- pitting edema in bilateral lower legs Skin: No rashes or ulcers Psychiatry:  Mood & affect appropriate.     Data Reviewed: I have personally reviewed following labs and imaging studies  CBC: Recent Labs  Lab 08/03/20 2037 08/04/20 0459 08/04/20 1600 08/05/20 0254 08/07/20 0153  WBC 17.6* 11.9* 7.6 7.5 7.3  HGB 7.6* 6.9* 6.1* 6.6* 6.6*  HCT 23.6* 21.0* 18.3* 19.7* 20.7*  MCV 87.1 86.1 85.5 85.7 88.5  PLT 166 137* PLATELET CLUMPS NOTED ON SMEAR, UNABLE TO ESTIMATE  100* [id number]*   Basic Metabolic Panel: Recent Labs  Lab 08/04/20 0459 08/04/20 1600 08/05/20 0254 08/05/20 1623 08/06/20 0419 08/06/20 1605 08/07/20 0153  NA 131* 129* 130* 132* 131* 131* 129*  K 5.4* 4.4 3.9 3.4* 4.2 4.0 4.2  CL 102 99 100 100  99 100 99  CO2 20* '23 24 25 25 22 22  '$ GLUCOSE 108* 102* 91 103* 80 92 74  BUN 43* 33* 27* 23* 31* 41* 46*  CREATININE 3.95* 3.22* 2.93* 2.55* 3.53* 4.56* 4.96*  CALCIUM 7.4* 7.4* 7.6* 7.4* 7.9* 7.6* 7.7*  MG 3.0* 2.8* 2.7*  --  2.6*  --  2.7*  PHOS 4.3 3.5 4.0 3.8 4.8* 5.4* 6.1*   GFR: Estimated Creatinine Clearance: 29.4 mL/min (A) (by C-G formula based on SCr of 4.96 mg/dL (H)). Liver Function Tests: Recent Labs  Lab 08/05/20 0254 08/05/20 1623 08/06/20 0419 08/06/20 1605 08/07/20 0153  ALBUMIN 1.9* 1.9* 1.8* 1.8* 1.8*   No results for input(s): LIPASE, AMYLASE in the last 168 hours. No results for input(s): AMMONIA in the last 168 hours. Coagulation Profile: Recent Labs  Lab 08/03/20 1604 08/05/20 0254 08/06/20 0419 08/07/20 0153  INR 1.6* 1.6* 3.0* 3.9*   Cardiac Enzymes: No results for input(s): CKTOTAL, CKMB, CKMBINDEX, TROPONINI in the last 168 hours. BNP (last 3 results) No results for input(s): PROBNP in the last 8760 hours. HbA1C: No results for input(s): HGBA1C in the last 72 hours. CBG: Recent Labs  Lab 08/05/20 1128 08/05/20 1550 08/05/20 2148 08/06/20 0604 08/06/20 1136  GLUCAP 109* 117* 95 74 94   Lipid Profile: No results for input(s): CHOL, HDL, LDLCALC, TRIG, CHOLHDL, LDLDIRECT in the last 72 hours. Thyroid Function Tests: No results for input(s): TSH, T4TOTAL, FREET4, T3FREE, THYROIDAB in the last 72 hours. Anemia Panel: No results for input(s): VITAMINB12, FOLATE, FERRITIN, TIBC, IRON, RETICCTPCT in the last 72 hours. Urine analysis:    Component Value Date/Time   COLORURINE AMBER (A) 07/18/2020 0702   APPEARANCEUR TURBID (A) 07/18/2020 0702   LABSPEC 1.027 07/18/2020 0702   PHURINE 5.0 07/18/2020 0702   GLUCOSEU NEGATIVE 07/18/2020 0702   HGBUR MODERATE (A) 07/18/2020 0702   BILIRUBINUR SMALL (A) 07/18/2020 0702   KETONESUR NEGATIVE 07/18/2020 0702   PROTEINUR 100 (A) 07/18/2020 0702   NITRITE NEGATIVE 07/18/2020 0702    LEUKOCYTESUR TRACE (A) 07/18/2020 0702   Sepsis Labs: '@LABRCNTIP'$ (procalcitonin:4,lacticidven:4) ) Recent Results (from the past 240 hour(s))  Culture, blood (routine x 2)     Status: None   Collection Time: 07/29/20  7:39 AM   Specimen: BLOOD RIGHT HAND  Result Value Ref Range Status   Specimen Description BLOOD RIGHT HAND  Final   Special Requests   Final    BOTTLES DRAWN AEROBIC AND ANAEROBIC Blood Culture results may not be optimal due to an inadequate volume of blood received in culture bottles   Culture   Final    NO GROWTH 5 DAYS Performed at [facility], [place] [address] [place], [place] [address]    Report Status 08/03/2020 FINAL  Final  Culture, blood (routine x 2)     Status: None   Collection Time: 07/29/20  9:19 AM   Specimen: BLOOD RIGHT HAND  Result Value Ref Range Status   Specimen Description BLOOD RIGHT HAND  Final   Special Requests   Final    BOTTLES DRAWN AEROBIC ONLY Blood Culture results may not be optimal due to an inadequate volume of blood received in culture bottles  Culture   Final    NO GROWTH 5 DAYS Performed at [facility], [place] [address] [place], [place] [address]    Report Status 08/03/2020 FINAL  Final  Aerobic Culture w Gram Stain (superficial specimen)     Status: None   Collection Time: 08/03/20 10:22 AM   Specimen: PATH Vessel; Tissue  Result Value Ref Range Status   Specimen Description TISSUE  Final   Special Requests AORTIC VALVE  Final   Gram Stain   Final    ABUNDANT WBC PRESENT,BOTH PMN AND MONONUCLEAR NO ORGANISMS SEEN    Culture   Final    NO GROWTH Performed at [facility], [address] [place], [place] [address]    Report Status 08/06/2020 FINAL  Final         Radiology Studies: DG Chest Port 1 View  Result Date: 08/06/2020 CLINICAL DATA:  Status post open heart surgery. EXAM: PORTABLE CHEST 1 VIEW COMPARISON:  08/05/2020 FINDINGS: Right sided IJ catheter is identified with tip overlying the  superior vena cava. Left IJ Cordis remains in place following removal of Swan-Ganz catheter. Previous median sternotomy. Stable cardiac enlargement. Small volume of pneumopericardium is unchanged. Mild left base atelectasis appears improved. No pneumothorax. IMPRESSION: Improved aeration to the left base. Electronically Signed   By: Kerby Moors M.D.   On: 08/06/2020 08:25      Scheduled Meds: . (feeding supplement) PROSource Plus  30 mL Oral BID BM  . sodium chloride   Intravenous Once  . acetaminophen  1,000 mg Oral Q6H   Or  . acetaminophen (TYLENOL) oral liquid 160 mg/5 mL  1,000 mg Per Tube Q6H  . aspirin EC  81 mg Oral Daily  . bisacodyl  10 mg Oral Daily   Or  . bisacodyl  10 mg Rectal Daily  . Chlorhexidine Gluconate Cloth  6 each Topical Daily  . clonazePAM  0.5 mg Oral BID  . collagenase   Topical Daily  . darbepoetin (ARANESP) injection - NON-DIALYSIS  200 mcg Subcutaneous Q Wed-1800  . docusate sodium  200 mg Oral Daily  . fenofibrate  160 mg Oral Daily  . Gerhardt's butt cream   Topical BID  . lidocaine  2 patch Transdermal Q24H  . melatonin  3 mg Oral QHS  . metoprolol tartrate  12.5 mg Oral BID   Or  . metoprolol tartrate  12.5 mg Per Tube BID  . warfarin  2 mg Oral q1600  . Warfarin - Physician Dosing Inpatient   Does not apply q1600   Continuous Infusions: . ceFEPime (MAXIPIME) IV 1 g (08/07/20 1343)     LOS: 20 days      Debbe Odea, MD [facility] Pager: [email or web address] 08/07/2020, 5:19 PM

## 2020-08-07 NOTE — Anesthesia Postprocedure Evaluation (Signed)
Anesthesia Post Note  Patient: Alex [name]  Procedure(s) Performed: BENTALL PROCEDURE USING ON-X AORTIC VALVE SIZE 23 MM AND PERI-GUARD 4CM X 4CM PERICARDIUM (N/A ) TRANSESOPHAGEAL ECHOCARDIOGRAM (TEE) (N/A )     Patient location during evaluation: SICU Anesthesia Type: General Level of consciousness: sedated Pain management: pain level controlled Vital Signs Assessment: post-procedure vital signs reviewed and stable Respiratory status: patient remains intubated per anesthesia plan Cardiovascular status: stable Postop Assessment: no apparent nausea or vomiting Anesthetic complications: no   No complications documented.  Last Vitals:  Vitals:   08/07/20 1100 08/07/20 1130  BP: 123/70 (!) 91/51  Pulse: 83 78  Resp:    Temp:    SpO2:      Last Pain:  Vitals:   08/07/20 1020  TempSrc: Oral  PainSc:                  Alex [name]

## 2020-08-08 ENCOUNTER — Inpatient Hospital Stay (HOSPITAL_COMMUNITY)
Admission: EM | Admit: 2020-08-08 | Discharge: 2020-09-15 | DRG: 288 | Disposition: A | Discharge status: Home / Self Care | Payer: 59 | Attending: Family Medicine | Admitting: Family Medicine

## 2020-08-08 ENCOUNTER — Inpatient Hospital Stay (HOSPITAL_COMMUNITY): Payer: 59

## 2020-08-08 ENCOUNTER — Emergency Department (HOSPITAL_COMMUNITY): Payer: 59

## 2020-08-08 ENCOUNTER — Other Ambulatory Visit: Payer: Self-pay

## 2020-08-08 ENCOUNTER — Encounter (HOSPITAL_COMMUNITY): Payer: Self-pay | Admitting: Emergency Medicine

## 2020-08-08 DIAGNOSIS — F1120 Opioid dependence, uncomplicated: Secondary | ICD-10-CM | POA: Diagnosis present

## 2020-08-08 DIAGNOSIS — G8929 Other chronic pain: Secondary | ICD-10-CM | POA: Diagnosis not present

## 2020-08-08 DIAGNOSIS — I483 Typical atrial flutter: Secondary | ICD-10-CM | POA: Diagnosis not present

## 2020-08-08 DIAGNOSIS — D638 Anemia in other chronic diseases classified elsewhere: Secondary | ICD-10-CM | POA: Diagnosis present

## 2020-08-08 DIAGNOSIS — U071 COVID-19: Secondary | ICD-10-CM | POA: Diagnosis not present

## 2020-08-08 DIAGNOSIS — Z23 Encounter for immunization: Secondary | ICD-10-CM

## 2020-08-08 DIAGNOSIS — I7789 Other specified disorders of arteries and arterioles: Secondary | ICD-10-CM

## 2020-08-08 DIAGNOSIS — E669 Obesity, unspecified: Secondary | ICD-10-CM | POA: Diagnosis present

## 2020-08-08 DIAGNOSIS — Z952 Presence of prosthetic heart valve: Secondary | ICD-10-CM | POA: Diagnosis not present

## 2020-08-08 DIAGNOSIS — F1510 Other stimulant abuse, uncomplicated: Secondary | ICD-10-CM | POA: Diagnosis present

## 2020-08-08 DIAGNOSIS — I330 Acute and subacute infective endocarditis: Principal | ICD-10-CM | POA: Diagnosis present

## 2020-08-08 DIAGNOSIS — E871 Hypo-osmolality and hyponatremia: Secondary | ICD-10-CM | POA: Diagnosis present

## 2020-08-08 DIAGNOSIS — K219 Gastro-esophageal reflux disease without esophagitis: Secondary | ICD-10-CM | POA: Diagnosis not present

## 2020-08-08 DIAGNOSIS — M25561 Pain in right knee: Secondary | ICD-10-CM | POA: Diagnosis present

## 2020-08-08 DIAGNOSIS — R600 Localized edema: Secondary | ICD-10-CM | POA: Diagnosis not present

## 2020-08-08 DIAGNOSIS — R34 Anuria and oliguria: Secondary | ICD-10-CM | POA: Diagnosis not present

## 2020-08-08 DIAGNOSIS — Z8673 Personal history of transient ischemic attack (TIA), and cerebral infarction without residual deficits: Secondary | ICD-10-CM

## 2020-08-08 DIAGNOSIS — Z6837 Body mass index (BMI) 37.0-37.9, adult: Secondary | ICD-10-CM

## 2020-08-08 DIAGNOSIS — L89310 Pressure ulcer of right buttock, unstageable: Secondary | ICD-10-CM | POA: Diagnosis present

## 2020-08-08 DIAGNOSIS — I110 Hypertensive heart disease with heart failure: Secondary | ICD-10-CM | POA: Diagnosis present

## 2020-08-08 DIAGNOSIS — F17210 Nicotine dependence, cigarettes, uncomplicated: Secondary | ICD-10-CM | POA: Diagnosis present

## 2020-08-08 DIAGNOSIS — I4891 Unspecified atrial fibrillation: Secondary | ICD-10-CM

## 2020-08-08 DIAGNOSIS — L89150 Pressure ulcer of sacral region, unstageable: Secondary | ICD-10-CM | POA: Diagnosis present

## 2020-08-08 DIAGNOSIS — I5032 Chronic diastolic (congestive) heart failure: Secondary | ICD-10-CM | POA: Diagnosis present

## 2020-08-08 DIAGNOSIS — I339 Acute and subacute endocarditis, unspecified: Secondary | ICD-10-CM

## 2020-08-08 DIAGNOSIS — D6959 Other secondary thrombocytopenia: Secondary | ICD-10-CM | POA: Diagnosis present

## 2020-08-08 DIAGNOSIS — M549 Dorsalgia, unspecified: Secondary | ICD-10-CM | POA: Diagnosis not present

## 2020-08-08 DIAGNOSIS — D62 Acute posthemorrhagic anemia: Secondary | ICD-10-CM | POA: Diagnosis present

## 2020-08-08 DIAGNOSIS — N179 Acute kidney failure, unspecified: Secondary | ICD-10-CM | POA: Diagnosis present

## 2020-08-08 DIAGNOSIS — F1190 Opioid use, unspecified, uncomplicated: Secondary | ICD-10-CM | POA: Diagnosis present

## 2020-08-08 DIAGNOSIS — E876 Hypokalemia: Secondary | ICD-10-CM | POA: Diagnosis not present

## 2020-08-08 DIAGNOSIS — F419 Anxiety disorder, unspecified: Secondary | ICD-10-CM | POA: Diagnosis not present

## 2020-08-08 DIAGNOSIS — W19XXXA Unspecified fall, initial encounter: Secondary | ICD-10-CM | POA: Diagnosis not present

## 2020-08-08 DIAGNOSIS — B9689 Other specified bacterial agents as the cause of diseases classified elsewhere: Secondary | ICD-10-CM | POA: Diagnosis present

## 2020-08-08 DIAGNOSIS — R609 Edema, unspecified: Secondary | ICD-10-CM | POA: Diagnosis not present

## 2020-08-08 DIAGNOSIS — E875 Hyperkalemia: Secondary | ICD-10-CM | POA: Diagnosis present

## 2020-08-08 DIAGNOSIS — Z20822 Contact with and (suspected) exposure to covid-19: Secondary | ICD-10-CM | POA: Diagnosis present

## 2020-08-08 DIAGNOSIS — D631 Anemia in chronic kidney disease: Secondary | ICD-10-CM | POA: Diagnosis not present

## 2020-08-08 DIAGNOSIS — R509 Fever, unspecified: Secondary | ICD-10-CM | POA: Diagnosis not present

## 2020-08-08 DIAGNOSIS — N170 Acute kidney failure with tubular necrosis: Secondary | ICD-10-CM

## 2020-08-08 DIAGNOSIS — E8809 Other disorders of plasma-protein metabolism, not elsewhere classified: Secondary | ICD-10-CM | POA: Diagnosis not present

## 2020-08-08 DIAGNOSIS — R739 Hyperglycemia, unspecified: Secondary | ICD-10-CM | POA: Diagnosis present

## 2020-08-08 DIAGNOSIS — I6340 Cerebral infarction due to embolism of unspecified cerebral artery: Secondary | ICD-10-CM | POA: Diagnosis not present

## 2020-08-08 DIAGNOSIS — D649 Anemia, unspecified: Secondary | ICD-10-CM

## 2020-08-08 DIAGNOSIS — E43 Unspecified severe protein-calorie malnutrition: Secondary | ICD-10-CM | POA: Diagnosis present

## 2020-08-08 DIAGNOSIS — I38 Endocarditis, valve unspecified: Secondary | ICD-10-CM | POA: Diagnosis present

## 2020-08-08 DIAGNOSIS — M25569 Pain in unspecified knee: Secondary | ICD-10-CM

## 2020-08-08 DIAGNOSIS — K5900 Constipation, unspecified: Secondary | ICD-10-CM | POA: Diagnosis not present

## 2020-08-08 DIAGNOSIS — D509 Iron deficiency anemia, unspecified: Secondary | ICD-10-CM | POA: Diagnosis not present

## 2020-08-08 DIAGNOSIS — N189 Chronic kidney disease, unspecified: Secondary | ICD-10-CM | POA: Diagnosis not present

## 2020-08-08 DIAGNOSIS — I63119 Cerebral infarction due to embolism of unspecified vertebral artery: Secondary | ICD-10-CM | POA: Diagnosis not present

## 2020-08-08 DIAGNOSIS — A4153 Sepsis due to Serratia: Secondary | ICD-10-CM | POA: Diagnosis present

## 2020-08-08 DIAGNOSIS — A498 Other bacterial infections of unspecified site: Secondary | ICD-10-CM | POA: Diagnosis not present

## 2020-08-08 DIAGNOSIS — M2550 Pain in unspecified joint: Secondary | ICD-10-CM

## 2020-08-08 LAB — COMPREHENSIVE METABOLIC PANEL
ALT: <5 U/L (ref 0–44)
AST: 24 U/L (ref 15–41)
Albumin: 2 g/dL — ABNORMAL LOW (ref 3.5–5.0)
Alkaline Phosphatase: 54 U/L (ref 38–126)
Anion gap: 7 (ref 5–15)
BUN: 35 mg/dL — ABNORMAL HIGH (ref 6–20)
CO2: 25 mmol/L (ref 22–32)
Calcium: 7.9 mg/dL — ABNORMAL LOW (ref 8.9–10.3)
Chloride: 101 mmol/L (ref 98–111)
Creatinine, Ser: 4.62 mg/dL — ABNORMAL HIGH (ref 0.61–1.24)
GFR, Estimated: 16 mL/min — ABNORMAL LOW (ref >60)
Glucose, Bld: 112 mg/dL — ABNORMAL HIGH (ref 70–99)
Potassium: 4 mmol/L (ref 3.5–5.1)
Sodium: 133 mmol/L — ABNORMAL LOW (ref 135–145)
Total Bilirubin: 0.9 mg/dL (ref 0.3–1.2)
Total Protein: 6.6 g/dL (ref 6.5–8.1)

## 2020-08-08 LAB — CBC
HCT: 28.6 % — ABNORMAL LOW (ref 39.0–52.0)
Hemoglobin: 8.9 g/dL — ABNORMAL LOW (ref 13.0–17.0)
MCH: 28.2 pg (ref 26.0–34.0)
MCHC: 31.1 g/dL (ref 30.0–36.0)
MCV: 90.5 fL (ref 80.0–100.0)
Platelets: 266 10*3/uL (ref 150–400)
RBC: 3.16 MIL/uL — ABNORMAL LOW (ref 4.22–5.81)
RDW: 16.6 % — ABNORMAL HIGH (ref 11.5–15.5)
WBC: 10.6 10*3/uL — ABNORMAL HIGH (ref 4.0–10.5)
nRBC: 0 % (ref 0.0–0.2)

## 2020-08-08 LAB — BPAM RBC
Blood Product Expiration Date: 202206062359
ISSUE DATE / TIME: 202205160941
Unit Type and Rh: 5100

## 2020-08-08 LAB — RESP PANEL BY RT-PCR (FLU A&B, COVID) ARPGX2
Influenza A by PCR: NEGATIVE
Influenza B by PCR: NEGATIVE
SARS Coronavirus 2 by RT PCR: NEGATIVE

## 2020-08-08 LAB — TYPE AND SCREEN
ABO/RH(D): O NEG
Antibody Screen: NEGATIVE
Unit division: 0

## 2020-08-08 MED ORDER — ACETAMINOPHEN 650 MG RE SUPP: 650.0000 mg | Freq: Four times a day (QID) | RECTAL | Status: DC | PRN

## 2020-08-08 MED ORDER — ACETAMINOPHEN 325 MG PO TABS
650.0000 mg | ORAL_TABLET | Freq: Four times a day (QID) | ORAL | Status: DC | PRN
  Administered 2020-08-09 – 2020-09-06 (×8): 650 mg via ORAL
  Filled 2020-08-08 (×8): qty 2

## 2020-08-08 MED ORDER — LIDOCAINE 5 % EX PTCH
1.0000 | MEDICATED_PATCH | CUTANEOUS | Status: DC
  Administered 2020-08-09 – 2020-09-14 (×36): 1 via TRANSDERMAL
  Filled 2020-08-08 (×37): qty 1

## 2020-08-08 MED ORDER — SODIUM CHLORIDE 0.9 % IV SOLN
1.0000 g | INTRAVENOUS | Status: DC
  Administered 2020-08-09: 1 g via INTRAVENOUS
  Filled 2020-08-08 (×2): qty 1

## 2020-08-08 MED FILL — Lidocaine HCl Local Preservative Free (PF) Inj 2%: INTRAMUSCULAR | Qty: 15 | Status: AC

## 2020-08-08 MED FILL — Potassium Chloride Inj 2 mEq/ML: INTRAVENOUS | Qty: 40 | Status: AC

## 2020-08-08 MED FILL — Sodium Bicarbonate IV Soln 8.4%: INTRAVENOUS | Qty: 50 | Status: AC

## 2020-08-08 MED FILL — Heparin Sodium (Porcine) Inj 1000 Unit/ML: INTRAMUSCULAR | Qty: 30 | Status: AC

## 2020-08-08 MED FILL — Sodium Chloride IV Soln 0.9%: INTRAVENOUS | Qty: 8000 | Status: AC

## 2020-08-08 MED FILL — Electrolyte-R (PH 7.4) Solution: INTRAVENOUS | Qty: 4000 | Status: AC

## 2020-08-08 MED FILL — Heparin Sodium (Porcine) Inj 1000 Unit/ML: INTRAMUSCULAR | Qty: 40 | Status: AC

## 2020-08-08 MED FILL — Calcium Chloride Inj 10%: INTRAVENOUS | Qty: 10 | Status: AC

## 2020-08-08 NOTE — Progress Notes (Signed)
Pharmacy Antibiotic Note  Alex [name] is a 40 y.o. male admitted on 08/08/2020 and found to have Serratia bacteremia with TEE showing endocarditis. Pharmacy has been consulted for Cefepime dosing. 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 ID planning for antibiotics stop date 6 weeks from valve repair. Valve repair occurred on 08/02/20. Patient was started on CRRT which was stopped 5/14 at 1800. Planning for iHD, first session 5/16. Planning for 6 weeks of cefepime starting 5/13 per ID. Of note, patient missed 1 dose of cefepime 5/13. Patient left AMA yesterday but is back tonight.  Resume cefepime and coumadin   Plan: - Cefepime 1gm IV q24 hours -Check protime daily - Monitor renal function/HD tolerance, tissue culture  Height: '6\' 4"'$  (193 cm) Weight: 125.1 kg (275 lb 12.7 oz) IBW/kg (Calculated) : 86.8  Temp (24hrs), Avg:99.2 F (37.3 C), Min:98.9 F (37.2 C), Max:99.4 F (37.4 C)  Recent Labs  Lab 08/04/20 0459 08/04/20 1600 08/05/20 0254 08/05/20 1623 08/06/20 0419 08/06/20 1605 08/07/20 0153 08/07/20 1746 08/08/20 1539 08/08/20 1540  WBC 11.9* 7.6 7.5  --   --   --  7.3  --  10.6*  --   CREATININE 3.95* 3.22* 2.93*   < > 3.53* 4.56* 4.96* 3.25*  --  4.62*   < > = values in this interval not displayed.    Estimated Creatinine Clearance: 31.6 mL/min (A) (by C-G formula based on SCr of 4.62 mg/dL (H)).    No Known Allergies   Thanks for allowing pharmacy to be a part of this patient's care.  Excell Seltzer, PharmD Clinical Pharmacist

## 2020-08-08 NOTE — Discharge Summary (Signed)
Physician Discharge Summary  EMMERT ROETHLER WSF:[id number] DOB: Jan 28, 1981 DOA: 07/18/2020  PCP: [place], [place] date: 07/18/2020 Discharge date: 08/08/2020  THIS PATIENT LEFT AMA DURING MY OFF HOURS BELOW IS PART OF MY LAST NOTE Brief Narrative:  Alex [name] is a 40 year old male with polysubstance abuse (nicotine, heroin and methamphetamine) on Suboxone who presented to the ED at [facility] on 4/26 with altered mental status preceded by being feverish, back pain and headache.   In the ED he was hypotensive with a creatinine of 5.37, lactic acid of 2.6, hypoglycemia, platelets of 30, WBC 14.9 and T bilirubin of 5.4 he was given IM Geodon in the ED and he was started on antibiotics and pressors.  He was transferred to the [facility], ICU.  4/26 transfer from [facility] to [facility]  CT head 4/26 >> no acute findings  CT abd/pelvis 4/26 >> cholelithiasis w/o inflammatory chagnes, mild hepatomegaly (25 cm), moderate splenomegaly (16.7 cm), 2 mm non obstructing kidney stone on Lt  4/27 CVL placed. Pressors escalating.   4/28 intubated for TEE, CVC, HD, and A-line placed. CRRT started   Remains on CRRT  MRI on 4/30-multiple infarcts  5/4: repeat TEE shows an aortic valve abscess  5/5: extubated , self-removed HD cath so CRRT holiday> developed severe hyperkalemia overnight.  5/6: new temp HD cath placed   5/8: new temp HD cath placed after prior one self-removed by patient  5/9: Orthopantogram neg for abscess  5/10: HD cath replaced by IR [facility]  5/12 to OR for AVR, aortic root reconstruction, closure of aortic sinus track. To 2H ICU on vent post op. 4 units PRBC given intraop.   5/13 extubated, on CRRT, remains on pressors    Subjective: No complaints today    Assessment & Plan:   Principal Problem:   Aortic valve endocarditis with perivalvular abscess, Serratia bacteremia and septic emboli causing strokes -MRI brain  07/22/20 revealed dozens of primary punctate acute infarcts scattered throughout the cerebellum and both cerebral hemispheres-  several are associated with petechial blood products -Status post mechanical aortic valve replacement, aortic root reconstruction and closure of aortic sinus track -Needs 6 weeks of cefepime per ID with start date being 5/13 -Continue Coumadin per pharmacy to keep INR 2.5- 3.5 -He is able to ambulate without difficulty  Active Problems: Cardiogenic shock, elevated total bilirubin and acute hypoxemic respiratory failure requiring intubation - Secondary to above  AKI, electrolyte abnormalities - Suspected to be secondary to ATN related to septic shock - Required CRRT which was started on 4/28 - He pulled out his HD catheter on 5/5 and 5/8 - He continues to require hemodialysis Monday Wednesday Friday- beginning to urinate  Anemia/thrombocytopenia secondary to acute infection - Status post 6 units of PRBC -Hemoglobin still 6.6 today-transfuse another unit of packed red blood cells today  - Thrombocytopenia steadily improving    Amphetamine abuse (HCC)   Nicotine dependence, cigarettes, uncomplicated   Opioid dependence on agonist therapy  -Continue Suboxone  Acute metabolic encephalopathy - Due to acute infection- improved    Time spent in minutes: 35 DVT prophylaxis: SCDs Start: 08/04/20 0855 warfarin (COUMADIN) tablet 2 mg    Code Status: Full code Family Communication:  Level of Care: Level of care: Progressive Disposition Plan:  Status is: Inpatient  Remains inpatient appropriate because:IV treatments appropriate due to intensity of illness or inability to take PO   Dispo: The patient is from: Home  Anticipated d/c is  to: Home  Patient currently is not medically stable to d/c.              Difficult to place patient No      Consultants:   Pulmonary critical care  ID  Nephrology  CT  surgery  Neurology  Interventional radiology Procedures:   Intubation/extubation  TEE  Placement of hemodialysis catheters  mechanical aortic valve replacement, aortic root reconstruction and closure of aortic sinus track   Antimicrobials:             Anti-infectives (From admission, onward)       Vitals:   08/07/20 1243 08/07/20 1551 08/07/20 2018 08/07/20 2130  BP: 123/62 117/69 127/75   Pulse: 81 95    Resp: _0 Temp: (!) 97.3 F (36.3 C) 97.6 F (36.4 C) 98.7 F (37.1 C)   TempSrc: Oral Oral Oral   SpO2: 99% 100% 98% 95%  Weight: 124.5 kg     Height:           Discharge Instructions   Allergies as of 08/07/2020   No Known Allergies     Medication List    ASK your doctor about these medications   buprenorphine-naloxone 8-2 mg Subl SL tablet Commonly known as: SUBOXONE Place 1 tablet under the tongue 2 (two) times daily. May place additional 1/2 tablet under the tongue daily as needed Ask about: Should I take this medication?   lisinopril-hydrochlorothiazide 10-12.5 MG tablet Commonly known as: ZESTORETIC Take 1 tablet by mouth daily.   naloxone 4 MG/0.1ML Liqd nasal spray kit Commonly known as: NARCAN Place 1 spray into the nose once. As needed for up to 1 dose. Ask about: Which instructions should I use?       No Known Allergies    CT ABDOMEN PELVIS WO CONTRAST  Result Date: 07/18/2020 CLINICAL DATA:  Acute, nonlocalized abdominal pain, acute renal failure, leukocytosis EXAM: CT ABDOMEN AND PELVIS WITHOUT CONTRAST TECHNIQUE: Multidetector CT imaging of the abdomen and pelvis was performed following the standard protocol without IV contrast. COMPARISON:  None. FINDINGS: Lower chest: Mild bibasilar atelectasis. The visualized heart and pericardium are unremarkable. Hepatobiliary: Cholelithiasis without pericholecystic inflammatory change. Mild hepatomegaly with the liver measuring 25 cm in craniocaudal dimension. Liver otherwise  unremarkable. No intra or extrahepatic biliary ductal dilation. Pancreas: Unremarkable Spleen: Moderate splenomegaly with the spleen measuring up to 16.7 cm in greatest dimension. No definite intra splenic lesion identified on this noncontrast examination. Adrenals/Urinary Tract: The adrenal glands are unremarkable. The kidneys are normal in size and position. 2 mm nonobstructing calculus within the lower pole of the left kidney. The kidneys are otherwise unremarkable. Foley catheter balloon is seen within the decompressed bladder lumen. Stomach/Bowel: Stomach is within normal limits. Appendix appears normal. No evidence of bowel wall thickening, distention, or inflammatory changes. No free intraperitoneal gas or fluid. Vascular/Lymphatic: No significant vascular findings are present. No enlarged abdominal or pelvic lymph nodes. Reproductive: Prostate is unremarkable. Other: No abdominal wall hernia.  Rectum unremarkable. Musculoskeletal: No lytic or blastic bone lesion. No acute bone abnormality. IMPRESSION: Mild to moderate hepatosplenomegaly. Cholelithiasis without superimposed inflammatory change. Minimal left nonobstructing nephrolithiasis. Electronically Signed   By: Fidela Salisbury MD   On: 07/18/2020 05:53   DG Orthopantogram  Result Date: 07/31/2020 CLINICAL DATA:  Dental clearance for cardiac surgery EXAM: ORTHOPANTOGRAM/PANORAMIC COMPARISON:  None. FINDINGS: Panoramic view of the mandible was performed and reveals no acute fracture. Teeth are well visualize without evidence of periapical lucency to  suggest abscess. No other focal abnormality is seen IMPRESSION: No acute abnormality noted. Electronically Signed   By: Inez Catalina M.D.   On: 07/31/2020 19:06   DG Chest 1 View  Result Date: 07/30/2020 CLINICAL DATA:  Central line placement. EXAM: CHEST  1 VIEW COMPARISON:  One-view chest x-ray 07/28/2020 FINDINGS: Heart size is normal.  Lung volumes are low.  Lungs are clear. Right IJ line was removed.  Left IJ line terminates in the mid SVC. No pneumothorax is present. IMPRESSION: Interval placement of left IJ line terminating in the mid SVC. No pneumothorax. Electronically Signed   By: San Morelle M.D.   On: 07/30/2020 14:59   DG Chest 1 View  Result Date: 07/18/2020 CLINICAL DATA:  40 year old male with central line placement. EXAM: CHEST  1 VIEW COMPARISON:  Earlier radiograph dated 07/18/2020. FINDINGS: Right subclavian central venous line with tip over the cavoatrial junction. No pneumothorax. There is mild cardiomegaly with mild vascular congestion. No focal consolidation, pleural effusion, or pneumothorax. No acute osseous pathology. IMPRESSION: Interval placement of a right subclavian central venous line with tip over the cavoatrial junction. No pneumothorax. Electronically Signed   By: Anner Crete M.D.   On: 07/18/2020 23:35   DG Chest 2 View  Result Date: 08/08/2020 CLINICAL DATA:  40 year old male with history of recent open heart surgery. EXAM: CHEST - 2 VIEW COMPARISON:  08/06/2020 FINDINGS: The mediastinal contours are within normal limits. Unchanged moderate cardiomegaly. Status post aortic valve replacement. Right IJ tunneled hemodialysis catheter remains in place with the tip in the central aspect of the superior vena cava. Interval removal of left internal jugular central line. Trace bilateral pleural effusions. The lungs are otherwise clear bilaterally without evidence of focal consolidation or pneumothorax. Median sternotomy wires are intact. IMPRESSION: Similar appearing moderate cardiomegaly with trace bilateral pleural effusions. Electronically Signed   By: [name] MD   On: 08/08/2020 16:19   CT Head Wo Contrast  Result Date: 07/18/2020 CLINICAL DATA:  Delirium, acute renal failure, leukocytosis EXAM: CT HEAD WITHOUT CONTRAST TECHNIQUE: Contiguous axial images were obtained from the base of the skull through the vertex without intravenous contrast. COMPARISON:   None. FINDINGS: Brain: Normal anatomic configuration. No abnormal intra or extra-axial mass lesion or fluid collection. No abnormal mass effect or midline shift. No evidence of acute intracranial hemorrhage or infarct. Ventricular size is normal. Cerebellum unremarkable. Vascular: No hyperdense vessel or unexpected calcification. Skull: Intact Sinuses/Orbits: Paranasal sinuses are clear. Orbits are unremarkable. Other: Mastoid air cells and middle ear cavities are clear. IMPRESSION: No acute intracranial abnormality. Electronically Signed   By: Fidela Salisbury MD   On: 07/18/2020 05:47   MR ANGIO HEAD WO CONTRAST  Result Date: 07/25/2020 CLINICAL DATA:  Stroke, follow-up. EXAM: MRA HEAD WITHOUT CONTRAST TECHNIQUE: Angiographic images of the Circle of Willis were obtained using MRA technique without intravenous contrast. COMPARISON:  Brain MRI 07/22/2020. FINDINGS: Moderate to moderately severe motion degradation, limiting evaluation. This precludes adequate evaluation for arterial stenoses. This also significantly limits evaluation for intracranial aneurysms. The intracranial internal carotid arteries, middle cerebral arteries and anterior cerebral arteries are patent without appreciable proximal large vessel occlusion. The vertebral arteries, basilar artery and posterior cerebral arteries are patent without appreciable proximal large vessel occlusion. Apparent moderate stenosis within the right posterior cerebral artery at the P1/P2 junction (series 2, image 83). Posterior communicating arteries are hypoplastic or absent bilaterally. IMPRESSION: No evidence of intracranial proximal large vessel occlusion. Significant motion degradation, precluding adequate evaluation for  intracranial arterial stenosis. Apparent moderate stenosis within the right posterior cerebral artery at the P1/P2 junction. Electronically Signed   By: Kellie Simmering DO   On: 07/25/2020 13:19   MR ANGIO NECK WO CONTRAST  Result Date:  07/25/2020 CLINICAL DATA:  Provided history: Septic arterial embolism. EXAM: MRA NECK WITHOUT CONTRAST TECHNIQUE: Multiplanar and multiecho pulse sequences of the neck were obtained without intravenous contrast. Angiographic images of the neck were obtained using MRA technique without intravenous contrast. COMPARISON:  Brain MRI 07/22/2020. FINDINGS: The examination is moderately motion degraded, limiting evaluation. Additionally, the origins of the left common carotid and left vertebral arteries are excluded from the field of view. The visible common carotid and internal carotid arteries are patent without appreciable hemodynamically significant stenosis. Tortuosity of the distal cervical ICAs bilaterally. The visible vertebral arteries are patent within the neck with antegrade flow. Left vertebral artery dominant. Apparent moderate/severe stenosis at the origin of the non dominant right vertebral artery. Additionally, there is an apparent moderate stenosis within the right vertebral artery at the V1/V2 junction. However, these apparent stenoses could potentially be exaggerate by motion artifact and noncontrast technique. IMPRESSION: The examination is moderately motion degraded, limiting evaluation. Additionally, the origins of the left common carotid and left vertebral arteries are excluded from the field of view. The visible common carotid and internal carotid arteries are patent within the neck without appreciable hemodynamically significant stenosis. The visible vertebral arteries are patent within the neck with antegrade flow. Apparent stenoses within the non-dominant right vertebral artery at the vessel origin (moderate/severe), and at the V1/V2 junction (moderate). However, these apparent stenoses could potentially be exaggerated by motion artifact and non-contrast technique. Electronically Signed   By: Kellie Simmering DO   On: 07/25/2020 13:10   MR BRAIN WO CONTRAST  Result Date: 07/22/2020 CLINICAL DATA:   Headache, fever and altered mental status. Possible sepsis. Polysubstance abuse. EXAM: MRI HEAD WITHOUT CONTRAST TECHNIQUE: Multiplanar, multiecho pulse sequences of the brain and surrounding structures were obtained without intravenous contrast. 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 Most of these are 3-4 mm in size or less. The largest infarction is in the left parieto-occipital junction region measuring 1.7 cm. There is involvement of the central pons, both cerebellar hemispheres, both cerebral hemispheres. A small amount of hemorrhage is associated with lesions in the cerebellar vermis, the left parietooccipital junction, the right posterior frontal brain in the right parietal brain. Punctate blood products evident with some of the other small infarctions. No evidence of vasogenic edema. No mass effect or shift. Given the history of polysubstance abuse, septic emboli are not excluded. No hydrocephalus or extra-axial collection. No evidence of pre-existing brain insult. Vascular: Major vessels at the base of the brain show flow. Skull and upper cervical spine: Negative Sinuses/Orbits: Clear/normal Other: Bilateral mastoid effusions. 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 Several are associated with petechial blood products as outlined above. Septic emboli not excluded given the clinical history. Electronically Signed   By: Nelson Chimes M.D.   On: 07/22/2020 17:27   US RENAL  Result Date: 07/18/2020 CLINICAL DATA:  Acute kidney injury. EXAM: RENAL / URINARY TRACT ULTRASOUND COMPLETE COMPARISON:  Noncontrast CT earlier today. FINDINGS: Right Kidney: Renal measurements:  13.1 x 6.5 x  6.4 cm = volume: 281 mL. Echogenicity within normal limits. No hydronephrosis. No focal renal lesion or stone. Left Kidney: Renal measurements: 14.2 x 7.2 x 6.3 cm = volume: 338 mL. Echogenicity within normal limits. No hydronephrosis. No focal renal lesion. Nonobstructing intrarenal stone on CT earlier today is not visualized. Bladder: Decompressed by Foley catheter. Other: Splenomegaly as seen on CT earlier today. Cystic lesion at the hilum measures 3.3 cm. What is labeled by the technologist as incidental spleen represents bowel. Technically challenging exam due to habitus. IMPRESSION: 1. No obstructive uropathy. 2. Punctate nonobstructing left renal stone on CT earlier today is not seen by ultrasound. Electronically Signed   By: Keith Rake M.D.   On: 07/18/2020 15:04   IR Fluoro Guide CV Line Right  Result Date: 08/01/2020 INDICATION: End-stage renal disease. In need of durable intravenous access for continuation hemodialysis. EXAM: TUNNELED CENTRAL VENOUS HEMODIALYSIS CATHETER PLACEMENT WITH ULTRASOUND AND FLUOROSCOPIC GUIDANCE MEDICATIONS: Ancef 2 gm IV . The antibiotic was given in an appropriate time interval prior to skin puncture. ANESTHESIA/SEDATION: Moderate (conscious) sedation was employed during this procedure. A total of Versed 1 mg and Fentanyl 50 mcg was administered intravenously. Moderate Sedation Time: 14 minutes. The patient's level of consciousness and vital signs were monitored continuously by radiology nursing throughout the procedure under my direct supervision. FLUOROSCOPY TIME:  18 seconds (5 mGy) COMPLICATIONS: None immediate. PROCEDURE: Informed written consent was obtained from the patient after a discussion of the risks, benefits, and alternatives to treatment. Questions regarding the procedure were encouraged and answered. The right neck and chest were prepped with chlorhexidine in a sterile fashion, and a sterile drape was applied covering the operative field.  Maximum barrier sterile technique with sterile gowns and gloves were used for the procedure. A timeout was performed prior to the initiation of the procedure. After creating a small venotomy incision, a micropuncture kit was utilized to access the internal jugular vein. Real-time ultrasound guidance was utilized for vascular access including the acquisition of a permanent ultrasound image documenting patency of the accessed vessel. The microwire was utilized to measure appropriate catheter length. A stiff Glidewire was advanced to the level of the IVC and the micropuncture sheath was exchanged for a peel-away sheath. A palindrome tunneled hemodialysis catheter measuring 19 cm from tip to cuff was tunneled in a retrograde fashion from the anterior chest wall to the venotomy incision. The catheter was then placed through the peel-away sheath with tips ultimately positioned within the superior aspect of the right atrium. Final catheter positioning was confirmed and documented with a spot radiographic image. The catheter aspirates and flushes normally. The catheter was flushed with appropriate volume heparin dwells. The catheter exit site was secured with a 0-Prolene retention suture. The venotomy incision was closed with an interrupted 4-0 Vicryl, Dermabond and Steri-strips. Dressings were applied. The patient tolerated the procedure well without immediate post procedural complication. IMPRESSION: Successful placement of 19 cm tip to cuff tunneled hemodialysis catheter via the right internal jugular vein with tips terminating within the superior aspect of the right atrium. The catheter is ready for immediate use. Electronically Signed   By: Sandi Mariscal M.D.   On: 08/01/2020 15:17   IR US Guide Vasc Access Right  Result Date: 08/01/2020 INDICATION: End-stage renal disease. In need of durable intravenous access for continuation hemodialysis. EXAM: TUNNELED CENTRAL VENOUS HEMODIALYSIS CATHETER PLACEMENT WITH  ULTRASOUND AND FLUOROSCOPIC GUIDANCE MEDICATIONS: Ancef 2 gm IV . The antibiotic was given in an appropriate time interval  prior to skin puncture. ANESTHESIA/SEDATION: Moderate (conscious) sedation was employed during this procedure. A total of Versed 1 mg and Fentanyl 50 mcg was administered intravenously. Moderate Sedation Time: 14 minutes. The patient's level of consciousness and vital signs were monitored continuously by radiology nursing throughout the procedure under my direct supervision. FLUOROSCOPY TIME:  18 seconds (5 mGy) COMPLICATIONS: None immediate. PROCEDURE: Informed written consent was obtained from the patient after a discussion of the risks, benefits, and alternatives to treatment. Questions regarding the procedure were encouraged and answered. The right neck and chest were prepped with chlorhexidine in a sterile fashion, and a sterile drape was applied covering the operative field. Maximum barrier sterile technique with sterile gowns and gloves were used for the procedure. A timeout was performed prior to the initiation of the procedure. After creating a small venotomy incision, a micropuncture kit was utilized to access the internal jugular vein. Real-time ultrasound guidance was utilized for vascular access including the acquisition of a permanent ultrasound image documenting patency of the accessed vessel. The microwire was utilized to measure appropriate catheter length. A stiff Glidewire was advanced to the level of the IVC and the micropuncture sheath was exchanged for a peel-away sheath. A palindrome tunneled hemodialysis catheter measuring 19 cm from tip to cuff was tunneled in a retrograde fashion from the anterior chest wall to the venotomy incision. The catheter was then placed through the peel-away sheath with tips ultimately positioned within the superior aspect of the right atrium. Final catheter positioning was confirmed and documented with a spot radiographic image. The catheter  aspirates and flushes normally. The catheter was flushed with appropriate volume heparin dwells. The catheter exit site was secured with a 0-Prolene retention suture. The venotomy incision was closed with an interrupted 4-0 Vicryl, Dermabond and Steri-strips. Dressings were applied. The patient tolerated the procedure well without immediate post procedural complication. IMPRESSION: Successful placement of 19 cm tip to cuff tunneled hemodialysis catheter via the right internal jugular vein with tips terminating within the superior aspect of the right atrium. The catheter is ready for immediate use. Electronically Signed   By: Sandi Mariscal M.D.   On: 08/01/2020 15:17   DG Chest Port 1 View  Result Date: 08/06/2020 CLINICAL DATA:  Status post open heart surgery. EXAM: PORTABLE CHEST 1 VIEW COMPARISON:  08/05/2020 FINDINGS: Right sided IJ catheter is identified with tip overlying the superior vena cava. Left IJ Cordis remains in place following removal of Swan-Ganz catheter. Previous median sternotomy. Stable cardiac enlargement. Small volume of pneumopericardium is unchanged. Mild left base atelectasis appears improved. No pneumothorax. IMPRESSION: Improved aeration to the left base. Electronically Signed   By: Kerby Moors M.D.   On: 08/06/2020 08:25   DG Chest Port 1 View  Result Date: 08/05/2020 CLINICAL DATA:  Pleural effusion. EXAM: PORTABLE CHEST 1 VIEW COMPARISON:  08/04/2020 FINDINGS: RIGHT-sided IJ central line tip overlies the superior vena cava. LEFT IJ sheath remains following removal of the Swan-Ganz catheter. Prior median sternotomy. Heart is enlarged, stable in configuration. Small pneumopericardium appears stable. There is minimal LEFT LOWER lobe atelectasis. Otherwise lungs are clear. IMPRESSION: Removal of Swan-Ganz catheter. Stable pneumopericardium. Electronically Signed   By: Nolon Nations M.D.   On: 08/05/2020 08:27   DG Chest Port 1 View  Result Date: 08/04/2020 CLINICAL DATA:   Pain EXAM: PORTABLE CHEST 1 VIEW COMPARISON:  Aug 03, 2020, Jul 30, 2020 FINDINGS: The cardiomediastinal silhouette is unchanged and enlarged in contour.Status post median sternotomy. Pneumopericardium, similar in comparison  to prior. Mediastinal drain. LEFT IJ PA catheter tip terminates over the RIGHT pulmonary artery. RIGHT IJ CVC tip terminates over the superior cavoatrial junction. No pleural effusion. No significant pneumothorax. Scattered bibasilar linear opacities most consistent with atelectasis. Visualized abdomen is unremarkable. No acute osseous abnormality. IMPRESSION: 1.  Support apparatus as described above. 2. Persistent pneumopericardium. Electronically Signed   By: Valentino Saxon MD   On: 08/04/2020 08:03   DG Chest Port 1 View  Result Date: 08/03/2020 CLINICAL DATA:  Post AVR EXAM: PORTABLE CHEST 1 VIEW COMPARISON:  Portable exam 1611 hours compared to 07/30/2020 FINDINGS: Tip of endotracheal tube projects 2.9 cm above carina. Nasogastric tube extends into stomach. LEFT jugular Swan-Ganz catheter with tip projecting over main pulmonary artery. RIGHT jugular central venous catheter tip projecting over SVC. Mediastinal drain. Enlargement of cardiac silhouette with small amount of pneumopericardium. Pulmonary vascularity normal. Minimal bibasilar atelectasis. No infiltrate, pleural effusion, or pneumothorax. IMPRESSION: Postoperative changes as above including a mild pneumopericardium. Electronically Signed   By: Lavonia Dana M.D.   On: 08/03/2020 16:24   DG CHEST PORT 1 VIEW  Result Date: 07/28/2020 CLINICAL DATA:  Central line placement EXAM: PORTABLE CHEST 1 VIEW COMPARISON:  07/22/2020 FINDINGS: Interval placement of right neck multi lumen vascular catheter, tip projecting near the superior cavoatrial junction. Interval removal of previously seen endotracheal tube, left neck vascular catheter, and right subclavian vascular catheter. No acute airspace opacity. IMPRESSION: Interval  placement of right neck multi lumen vascular catheter, tip projecting near the superior cavoatrial junction. Electronically Signed   By: Eddie Candle M.D.   On: 07/28/2020 11:07   DG CHEST PORT 1 VIEW  Result Date: 07/22/2020 CLINICAL DATA:  Respiratory failure EXAM: PORTABLE CHEST 1 VIEW COMPARISON:  July 20, 2020 FINDINGS: The ET tube and left central line are in good position. A right central line is in good position. The feeding tube terminates below today's film. Cardiomediastinal silhouette is stable. No pulmonary nodules or masses. No focal infiltrates. No overt edema. IMPRESSION: 1. Support apparatus as above. 2. No other acute abnormalities. Electronically Signed   By: Dorise Bullion III M.D   On: 07/22/2020 14:57   DG CHEST PORT 1 VIEW  Result Date: 07/20/2020 CLINICAL DATA:  Central line placement. EXAM: PORTABLE CHEST 1 VIEW COMPARISON:  Chest x-ray from same day at 12:09 p.m. FINDINGS: New left internal jugular dialysis catheter with tip in the proximal SVC. Unchanged right subclavian central venous catheter. Endotracheal tube remains in good position with the tip 3.7 cm above the carina. Normal heart size. Patchy airspace disease throughout both lungs, worse on the right, not significantly changed. No pleural effusion or pneumothorax. No acute osseous abnormality. IMPRESSION: 1. New left internal jugular dialysis catheter with tip in the proximal SVC. No pneumothorax. 2. Unchanged multifocal airspace disease. Electronically Signed   By: Titus Dubin M.D.   On: 07/20/2020 18:29   DG CHEST PORT 1 VIEW  Result Date: 07/20/2020 CLINICAL DATA:  Status post intubation. EXAM: PORTABLE CHEST 1 VIEW COMPARISON:  Single-view of the chest 07/19/2020. FINDINGS: Endotracheal tube is in place with the tip in good position at the level the clavicular heads. Right subclavian catheter is unchanged. New patchy bilateral airspace disease is much worse on the right. Heart size is enlarged. No  pneumothorax. IMPRESSION: ETT in good position. Right much worse than left patchy airspace disease worrisome for pneumonia. Electronically Signed   By: Inge Rise M.D.   On: 07/20/2020 12:48   DG Chest  Port 1 View  Result Date: 07/19/2020 CLINICAL DATA:  Hypoxia EXAM: PORTABLE CHEST 1 VIEW COMPARISON:  07/18/2020 FINDINGS: Right subclavian central venous catheter tip noted within the right atrium. Lung volumes are slightly small, but are stable, with mild left basilar atelectasis. No superimposed confluent pulmonary infiltrate. No pneumothorax or pleural effusion. Cardiac size is within normal limits. Pulmonary vascularity is normal. IMPRESSION: Stable examination.  Mild left basilar atelectasis. Electronically Signed   By: Fidela Salisbury MD   On: 07/19/2020 05:15   DG Chest Portable 1 View  Result Date: 07/18/2020 CLINICAL DATA:  Fever EXAM: PORTABLE CHEST 1 VIEW COMPARISON:  None. FINDINGS: Lung volumes are small, but are symmetric and are clear. No pneumothorax or pleural effusion. Cardiac size within normal limits. Pulmonary vascularity is normal. No acute bone abnormality. IMPRESSION: Pulmonary hypoinflation. Electronically Signed   By: Fidela Salisbury MD   On: 07/18/2020 04:09   DG Abd Portable 1V  Result Date: 07/25/2020 CLINICAL DATA:  Orogastric tube placement. EXAM: PORTABLE ABDOMEN - 1 VIEW COMPARISON:  July 22, 2020 FINDINGS: Orogastric tube with tip and side port overlying the stomach. Weighted enteric feeding catheter with tip overlying the stomach. Endotracheal tube with tip overlying the distal thoracic trachea. Left approach central venous catheter with tip overlying the SVC. The bowel gas pattern is normal. IMPRESSION: Orogastric tube with tip and side port overlying the stomach. Electronically Signed   By: Dahlia Bailiff MD   On: 07/25/2020 10:38   ECHOCARDIOGRAM COMPLETE  Result Date: 07/19/2020    ECHOCARDIOGRAM REPORT   Patient Name:   Alex [name] Date of Exam:  07/19/2020 Medical Rec #:  [id number]         Height:       76.0 in Accession #:    [id number]        Weight:       287.0 lb Date of Birth:  04/13/1980         BSA:          2.583 m Patient Age:    39 years          BP:           115/48 mmHg Patient Gender: M                 HR:           85 bpm. Exam Location:  Inpatient Procedure: 2D Echo, Cardiac Doppler and Color Doppler  Results discussed with Dr [name] at 1158. Indications:     Shock  History:         Patient has no prior history of Echocardiogram examinations.                  Risk Factors:Current Smoker.  Sonographer:     Cammy Brochure Referring Phys:  [place] Diagnosing Phys: Oswaldo Milian MD IMPRESSIONS  1. Left ventricular ejection fraction, by estimation, is 60 to 65%. The left ventricle has normal function. The left ventricle has no regional wall motion abnormalities. Left ventricular diastolic parameters were normal.  2. Right ventricular systolic function is normal. The right ventricular size is normal. Tricuspid regurgitation signal is inadequate for assessing PA pressure.  3. The mitral valve is normal in structure. No evidence of mitral valve regurgitation.  4. The aortic valve was not well visualized. Aortic valve regurgitation is severe. No aortic stenosis is present. Poor visualization of aortic valve but leaflets appears thickened, concerning for vegetation on AV causing  eccentric AI that appears severe. Holodiastolic flow reveral is seen in thoracic aorta, consistent with severe AI. Recommend TEE for further evaluation. FINDINGS  Left Ventricle: Left ventricular ejection fraction, by estimation, is 60 to 65%. The left ventricle has normal function. The left ventricle has no regional wall motion abnormalities. The left ventricular internal cavity size was normal in size. There is  no left ventricular hypertrophy. Left ventricular diastolic parameters were normal. Right Ventricle: The right ventricular size is normal. No  increase in right ventricular wall thickness. Right ventricular systolic function is normal. Tricuspid regurgitation signal is inadequate for assessing PA pressure. Left Atrium: Left atrial size was normal in size. Right Atrium: Right atrial size was not well visualized. Pericardium: There is no evidence of pericardial effusion. Mitral Valve: The mitral valve is normal in structure. No evidence of mitral valve regurgitation. Tricuspid Valve: The tricuspid valve is normal in structure. Tricuspid valve regurgitation is trivial. Aortic Valve: The aortic valve was not well visualized. Aortic valve regurgitation is severe. No aortic stenosis is present. Aortic valve mean gradient measures 8.0 mmHg. Aortic valve peak gradient measures 13.8 mmHg. Aortic valve area, by VTI measures 3.72 cm. Pulmonic Valve: The pulmonic valve was not well visualized. Pulmonic valve regurgitation is not visualized. Aorta: The aortic root is normal in size and structure. IAS/Shunts: The interatrial septum was not well visualized.  LEFT VENTRICLE PLAX 2D LVOT diam:     2.40 cm  Diastology LV SV:         117      LV e' medial:    9.68 cm/s LV SV Index:   45       LV E/e' medial:  6.6 LVOT Area:     4.52 cm LV e' lateral:   11.50 cm/s                         LV E/e' lateral: 5.6  RIGHT VENTRICLE             IVC RV Basal diam:  3.90 cm     IVC diam: 1.50 cm RV S prime:     15.90 cm/s TAPSE (M-mode): 2.5 cm LEFT ATRIUM             Index       RIGHT ATRIUM          Index LA diam:        3.80 cm 1.47 cm/m  RA Area:     9.47 cm LA Vol (A2C):   40.4 ml 15.64 ml/m RA Volume:   18.60 ml 7.20 ml/m LA Vol (A4C):   50.1 ml 19.40 ml/m LA Biplane Vol: 49.3 ml 19.09 ml/m  AORTIC VALVE AV Area (Vmax):    4.21 cm AV Area (Vmean):   3.53 cm AV Area (VTI):     3.72 cm AV Vmax:           186.00 cm/s AV Vmean:          137.000 cm/s AV VTI:            0.315 m AV Peak Grad:      13.8 mmHg AV Mean Grad:      8.0 mmHg LVOT Vmax:         173.00 cm/s LVOT  Vmean:        107.000 cm/s LVOT VTI:          0.259 m LVOT/AV VTI ratio: 0.82  AORTA Ao Root diam: 3.40  cm MITRAL VALVE MV Area (PHT): 5.13 cm    SHUNTS MV Decel Time: 148 msec    Systemic VTI:  0.26 m MV E velocity: 64.00 cm/s  Systemic Diam: 2.40 cm MV A velocity: 47.50 cm/s MV E/A ratio:  1.35 Oswaldo Milian MD Electronically signed by Oswaldo Milian MD Signature Date/Time: 07/19/2020/11:54:58 AM    Final (Updated)    ECHO TEE  Result Date: 07/27/2020    TRANSESOPHOGEAL ECHO REPORT   Patient Name:   KOLSTON LACOUNT Hobby Date of Exam: 07/26/2020 Medical Rec #:  [id number]         Height:       76.0 in Accession #:    [id number]        Weight:       265.7 lb Date of Birth:  Aug 10, 1980         BSA:          2.499 m Patient Age:    39 years          BP:           116/49 mmHg Patient Gender: M                 HR:           94 bpm. Exam Location:  Inpatient Procedure: 3D Echo, Transesophageal Echo, Cardiac Doppler and Color Doppler Indications:     Aortic endocarditis.  History:         Patient has prior history of Echocardiogram examinations.                  Aortic Valve Disease; Signs/Symptoms:Hypotension. Endocarditis.                  Polysubstance abuse. Shock.  Sonographer:     Roseanna Rainbow RDCS Referring Phys:  [id number] [facility] A [place] Diagnosing Phys: Rudean Haskell MD PROCEDURE: After discussion of the risks and benefits of a TEE, an informed consent was obtained from a family member. The patient was intubated. The transesophogeal probe was passed without difficulty through the esophogus of the patient. Imaged were obtained with the patient in a supine position. Sedation performed by performing physician. The patient developed no complications during the procedure. IMPRESSIONS  1. 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  2. Type 1d Aortic Regurgitation: Perforated left coronary cusp with severe, eccentric aortic regurgitation. Large 36 x  14 mm mobile vegetation on ventricular surface of     left coronary cusp. . The aortic valve is abnormal. Aortic valve regurgitation is severe.  3. Left ventricular ejection fraction, by estimation, is 60 to 65%. The left ventricle has normal function. The left ventricle has no regional wall motion abnormalities.  4. Right ventricular systolic function is normal. The right ventricular size is normal.  5. No left atrial/left atrial appendage thrombus was detected.  6. The mitral valve is normal in structure. No evidence of mitral valve regurgitation. Comparison(s): A prior study was performed on 07/20/2020. New aortic abscess. Notified CT surgery, ICU team, and family post procedure. FINDINGS  Left Ventricle: Left ventricular ejection fraction, by estimation, is 60 to 65%. The left ventricle has normal function. The left ventricle has no regional wall motion abnormalities. The left ventricular internal cavity size was normal in size. There is  no left ventricular hypertrophy. Right Ventricle: The right ventricular size is normal. No increase in right ventricular wall thickness. Right ventricular systolic function is normal. Left  Atrium: Left atrial size was normal in size. No left atrial/left atrial appendage thrombus was detected. Right Atrium: Right atrial size was normal in size. Pericardium: Trivial pericardial effusion is present. Mitral Valve: The mitral valve is normal in structure. No evidence of mitral valve regurgitation. Tricuspid Valve: The tricuspid valve is normal in structure. Tricuspid valve regurgitation is not demonstrated. Aortic Valve: Type 1d Aortic Regurgitation: Perforated left coronary cusp with severe, eccentric aortic regurgitation. Large 36 x 14 mm mobile vegetation on ventricular surface of left coronary cusp. The aortic valve is abnormal. Aortic valve regurgitation is severe. Pulmonic Valve: The pulmonic valve was grossly normal. Pulmonic valve regurgitation is trivial. Aorta: There is an  aortic root- Sinus of Valvasa abscess, demonstated with 3D an color flow Doppler. The ascending aorta was not well visualized. IAS/Shunts: The atrial septum is grossly normal. Rudean Haskell MD Electronically signed by Rudean Haskell MD Signature Date/Time: 07/27/2020/7:48:35 AM    Final    ECHO TEE  Result Date: 07/20/2020    TRANSESOPHOGEAL ECHO REPORT   Patient Name:   Alex [name] Date of Exam: 07/20/2020 Medical Rec #:  [id number]         Height:       76.0 in Accession #:    [id number]        Weight:       282.4 lb Date of Birth:  [birth year]         BSA:          2.565 m Patient Age:    39 years          BP:           130/48 mmHg Patient Gender: M                 HR:           90 bpm. Exam Location:  Inpatient Portions of this table do not appear on this page. Procedure: Transesophageal Echo, 3D Echo, Cardiac Doppler and Color Doppler Indications:    Bacteremia 790.7 / R78.81  History:        Patient has prior history of Echocardiogram examinations, most                 recent 07/19/2020. Polysubstance abuse.  Sonographer:    Darlina Sicilian RDCS Referring Phys: [phone] JILL D MCDANIEL PROCEDURE: After discussion of the risks and benefits of a TEE, an informed consent was obtained from a family member. The transesophogeal probe was passed without difficulty through the esophogus of the patient. Sedation performed by performing physician. Patients was under conscious sedation during this procedure. Image quality was excellent. The patient's vital signs; including heart rate, blood pressure, and oxygen saturation; remained stable throughout the procedure. The patient developed no complications during the procedure. Intubated, ETT in place. IMPRESSIONS  1. Left ventricular ejection fraction, by estimation, is 60 to 65%. The left ventricle has normal function. The left ventricle has no regional wall motion abnormalities.  2. Right ventricular systolic function is normal. The right ventricular size is  normal.  3. No left atrial/left atrial appendage thrombus was detected.  4. The mitral valve is normal in structure. No evidence of mitral valve regurgitation.  5. Perforated left coronary cusp with severe aortic regurgitation. Large 36 x 92m mobile vegetation on ventricular surface of left coronary cusp. The aortic valve is abnormal. Aortic valve regurgitation is severe.  6. Intubated, ETT in place. 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 FINDINGS  Left  Ventricle: Left ventricular ejection fraction, by estimation, is 60 to 65%. The left ventricle has normal function. The left ventricle has no regional wall motion abnormalities. The left ventricular internal cavity size was normal in size. Right Ventricle: The right ventricular size is normal. No increase in right ventricular wall thickness. Right ventricular systolic function is normal. Left Atrium: Left atrial size was normal in size. No left atrial/left atrial appendage thrombus was detected. Right Atrium: Right atrial size was normal in size. Pericardium: There is no evidence of pericardial effusion. Mitral Valve: The mitral valve is normal in structure. No evidence of mitral valve regurgitation. Tricuspid Valve: The tricuspid valve is normal in structure. Tricuspid valve regurgitation is not demonstrated. Aortic Valve: Perforated left coronary cusp with severe aortic regurgitation. Large 36 x 88m mobile vegetation on ventricular surface of left coronary cusp. The aortic valve is abnormal. Aortic valve regurgitation is severe. Aortic valve mean gradient measures 2.0 mmHg. Aortic valve peak gradient measures 3.2 mmHg. Aortic valve area, by VTI measures 9.21 cm. A mobile vegetation is seen. The AoV vegetation measures 30 mm x 15 mm. Pulmonic Valve: The pulmonic valve was normal in structure. Pulmonic valve regurgitation is not visualized. Aorta: The aortic root was not well visualized. Venous: The inferior  vena cava was not well visualized. IAS/Shunts: No atrial level shunt detected by color flow Doppler.  LEFT VENTRICLE PLAX 2D LVOT diam:     2.70 cm LV SV:         148 LV SV Index:   58 LVOT Area:     5.73 cm  AORTIC VALVE AV Area (Vmax):    11.08 cm AV Area (Vmean):   8.97 cm AV Area (VTI):     9.21 cm AV Vmax:           89.40 cm/s AV Vmean:          68.300 cm/s AV VTI:            0.161 m AV Peak Grad:      3.2 mmHg AV Mean Grad:      2.0 mmHg LVOT Vmax:         173.00 cm/s LVOT Vmean:        107.000 cm/s LVOT VTI:          0.259 m LVOT/AV VTI ratio: 1.61  SHUNTS Systemic VTI:  0.26 m Systemic Diam: 2.70 cm MCandee FurbishMD Electronically signed by MCandee FurbishMD Signature Date/Time: 07/20/2020/1:43:49 PM    Final    VAS [place]  Result Date: 07/26/2020 Carotid Arterial Duplex Study Patient Name:  JCRISTEN MURCIAPRUITT  Date of Exam:   07/24/2020 Medical Rec #: [id number]         Accession #:    [id number] of Birth: [birth year]         Patient Gender: M Patient Age:   39Y [facility] [place] Referring Phys: [id number] Hawking--------------------------------------------------------------------------------  Indications:       CVA. Risk Factors:      Current smoker. Other Factors:     Endocarditis, sepsis, IV drug abuse. Limitations        Today's exam was limited due to Dialysis acces in left neck,                    ventilation. Comparison Study:  No prior study on file Performing Technologist: CSharion DoveRVS  Examination Guidelines:  A complete evaluation includes B-mode imaging, spectral Doppler, color Doppler, and power Doppler as needed of all accessible portions of each vessel. Bilateral testing is considered an integral part of a complete examination. Limited examinations for reoccurring indications may be performed as noted.  Right Carotid Findings: +----------+--------+--------+--------+------------------+--------+           PSV cm/sEDV  cm/sStenosisPlaque DescriptionComments +----------+--------+--------+--------+------------------+--------+ CCA Prox  252     2                                          +----------+--------+--------+--------+------------------+--------+ CCA Distal271     3                                          +----------+--------+--------+--------+------------------+--------+ ICA Prox  118     19                                         +----------+--------+--------+--------+------------------+--------+ ICA Distal151     25                                         +----------+--------+--------+--------+------------------+--------+ +----------+--------+-------+--------+-------------------+           PSV cm/sEDV cmsDescribeArm Pressure (mmHG) +----------+--------+-------+--------+-------------------+ Subclavian101                                        +----------+--------+-------+--------+-------------------+ +---------+--------+---+--------+--+ VertebralPSV cm/s112EDV cm/s11 +---------+--------+---+--------+--+  Left Carotid Findings: +----------+--------+--------+--------+-------------------+           PSV cm/sEDV cm/sDescribeArm Pressure (mmHG) +----------+--------+--------+--------+-------------------+ [id number]                                         +----------+--------+--------+--------+-------------------+   Summary: Right Carotid: There was no evidence of thrombus, dissection, atherosclerotic                plaque or stenosis in the cervical carotid system. Left Carotid: Unable to insonate secondary to Dialysis access in left jugular. Vertebrals:  Right vertebral artery demonstrates antegrade flow. Subclavians: Normal flow hemodynamics were seen in bilateral subclavian              arteries. *See table(s) above for measurements and observations.  Electronically signed by Antony Contras MD on 07/26/2020 at 8:29:22 AM.    Final    VAS [place] LOWER EXTREMITY VENOUS  (DVT)  Result Date: 07/25/2020  Lower Venous DVT Study Patient Name:  Alex [name]  Date of Exam:   07/25/2020 Medical Rec #: [id number]          Accession #:    [id number] Date of Birth: 26-Dec-1980          Patient Gender: M Patient Age:   39Y Exam Location:  [facility] Procedure:      VAS [place] LOWER EXTREMITY VENOUS (DVT) Referring Phys: [id number] [place] --------------------------------------------------------------------------------  Indications: Edema.  Comparison Study: no prior Performing Technologist: Abram Sander RVS  Examination Guidelines: A complete evaluation includes B-mode imaging, spectral Doppler, color Doppler, and power Doppler as needed of all accessible portions of each vessel. Bilateral testing is considered an integral part of a complete examination. Limited examinations for reoccurring indications may be performed as noted. The reflux portion of the exam is performed with the patient in reverse Trendelenburg.  +---------+---------------+---------+-----------+----------+--------------+ RIGHT    CompressibilityPhasicitySpontaneityPropertiesThrombus Aging +---------+---------------+---------+-----------+----------+--------------+ CFV      Full           Yes      Yes                                 +---------+---------------+---------+-----------+----------+--------------+ SFJ      Full                                                        +---------+---------------+---------+-----------+----------+--------------+ FV Prox  Full                                                        +---------+---------------+---------+-----------+----------+--------------+ FV Mid   Full                                                        +---------+---------------+---------+-----------+----------+--------------+ FV DistalFull                                                        +---------+---------------+---------+-----------+----------+--------------+  PFV      Full                                                        +---------+---------------+---------+-----------+----------+--------------+ POP      Full           Yes      Yes                                 +---------+---------------+---------+-----------+----------+--------------+ PTV      Full                                                        +---------+---------------+---------+-----------+----------+--------------+ PERO     Full                                                        +---------+---------------+---------+-----------+----------+--------------+   +----+---------------+---------+-----------+----------+--------------+  LEFTCompressibilityPhasicitySpontaneityPropertiesThrombus Aging +----+---------------+---------+-----------+----------+--------------+ CFV Full           Yes      Yes                                 +----+---------------+---------+-----------+----------+--------------+     Summary: RIGHT: - There is no evidence of deep vein thrombosis in the lower extremity.  - No cystic structure found in the popliteal fossa.  LEFT: - No evidence of common femoral vein obstruction.  *See table(s) above for measurements and observations. Electronically signed by Curt Jews MD on 07/25/2020 at 8:14:08 PM.    Final       The results of significant diagnostics from this hospitalization (including imaging, microbiology, ancillary and laboratory) are listed below for reference.     Microbiology: Recent Results (from the past 240 hour(s))  Aerobic Culture w Gram Stain (superficial specimen)     Status: None   Collection Time: 08/03/20 10:22 AM   Specimen: PATH Vessel; Tissue  Result Value Ref Range Status   Specimen Description TISSUE  Final   Special Requests AORTIC VALVE  Final   Gram Stain   Final    ABUNDANT WBC PRESENT,BOTH PMN AND MONONUCLEAR NO ORGANISMS SEEN    Culture   Final    NO GROWTH Performed at [facility],  [address] [place], [place] [address]    Report Status 08/06/2020 FINAL  Final     Labs: BNP (last 3 results) Recent Labs    07/25/20 0334 07/26/20 0331  BNP 153.5* 88.4   Basic Metabolic Panel: Recent Labs  Lab 08/04/20 0459 08/04/20 1600 08/05/20 0254 08/05/20 1623 08/06/20 0419 08/06/20 1605 08/07/20 0153 08/07/20 1746 08/08/20 1540  NA 131* 129* 130* 132* 131* 131* 129* 133* 133*  K 5.4* 4.4 3.9 3.4* 4.2 4.0 4.2 4.4 4.0  CL 102 99 100 100 99 100 99 100 101  CO2 20* _0 GLUCOSE 108* 102* 91 103* 80 92 74 106* 112*  BUN 43* 33* 27* 23* 31* 41* 46* 22* 35*  CREATININE 3.95* 3.22* 2.93* 2.55* 3.53* 4.56* 4.96* 3.25* 4.62*  CALCIUM 7.4* 7.4* 7.6* 7.4* 7.9* 7.6* 7.7* 7.6* 7.9*  MG 3.0* 2.8* 2.7*  --  2.6*  --  2.7*  --   --   PHOS 4.3 3.5 4.0 3.8 4.8* 5.4* 6.1* 3.2  --    Liver Function Tests: Recent Labs  Lab 08/06/20 0419 08/06/20 1605 08/07/20 0153 08/07/20 1746 08/08/20 1540  AST  --   --   --   --  24  ALT  --   --   --   --  <5  ALKPHOS  --   --   --   --  54  BILITOT  --   --   --   --  0.9  PROT  --   --   --   --  6.6  ALBUMIN 1.8* 1.8* 1.8* 1.9* 2.0*   No results for input(s): LIPASE, AMYLASE in the last 168 hours. No results for input(s): AMMONIA in the last 168 hours. CBC: Recent Labs  Lab 08/04/20 0459 08/04/20 1600 08/05/20 0254 08/07/20 0153 08/07/20 1746 08/08/20 1539  WBC 11.9* 7.6 7.5 7.3  --  10.6*  HGB 6.9* 6.1* 6.6* 6.6* 8.4* 8.9*  HCT 21.0* 18.3* 19.7* 20.7* 25.2* 28.6*  MCV 86.1 85.5 85.7 88.5  --  90.5  PLT 137* PLATELET CLUMPS NOTED ON SMEAR, UNABLE TO ESTIMATE 100* 149*  --  266   Cardiac Enzymes: No results for input(s): CKTOTAL, CKMB, CKMBINDEX, TROPONINI in the last 168 hours. BNP: Invalid input(s): POCBNP CBG: Recent Labs  Lab 08/05/20 1128 08/05/20 1550 08/05/20 2148 08/06/20 0604 08/06/20 1136  GLUCAP 109* 117* 95 74 94   D-Dimer No results for input(s): DDIMER in the last 72  hours. Hgb A1c No results for input(s): HGBA1C in the last 72 hours. Lipid Profile No results for input(s): CHOL, HDL, LDLCALC, TRIG, CHOLHDL, LDLDIRECT in the last 72 hours. Thyroid function studies No results for input(s): TSH, T4TOTAL, T3FREE, THYROIDAB in the last 72 hours.  Invalid input(s): FREET3 Anemia work up No results for input(s): VITAMINB12, FOLATE, FERRITIN, TIBC, IRON, RETICCTPCT in the last 72 hours. Urinalysis    Component Value Date/Time   COLORURINE AMBER (A) 07/18/2020 0702   APPEARANCEUR TURBID (A) 07/18/2020 0702   LABSPEC 1.027 07/18/2020 0702   PHURINE 5.0 07/18/2020 0702   GLUCOSEU NEGATIVE 07/18/2020 0702   HGBUR MODERATE (A) 07/18/2020 0702   BILIRUBINUR SMALL (A) 07/18/2020 0702   KETONESUR NEGATIVE 07/18/2020 0702   PROTEINUR 100 (A) 07/18/2020 0702   NITRITE NEGATIVE 07/18/2020 0702   LEUKOCYTESUR TRACE (A) 07/18/2020 0702   Sepsis Labs Invalid input(s): PROCALCITONIN,  WBC,  LACTICIDVEN Microbiology Recent Results (from the past 240 hour(s))  Aerobic Culture w Gram Stain (superficial specimen)     Status: None   Collection Time: 08/03/20 10:22 AM   Specimen: PATH Vessel; Tissue  Result Value Ref Range Status   Specimen Description TISSUE  Final   Special Requests AORTIC VALVE  Final   Gram Stain   Final    ABUNDANT WBC PRESENT,BOTH PMN AND MONONUCLEAR NO ORGANISMS SEEN    Culture   Final    NO GROWTH Performed at [facility], [address], [place], [place] [address]    Report Status 08/06/2020 FINAL  Final        SIGNED:   Debbe Odea, MD  [facility] 08/08/2020, 6:41 PM

## 2020-08-08 NOTE — ED Notes (Signed)
Report called to [place]. Pt going to 4E room 27.

## 2020-08-08 NOTE — ED Provider Notes (Signed)
Emergency Medicine Provider Triage Evaluation Note  AZAEL MCGAUGHY , a 40 y.o. male  was evaluated in triage.  Pt complains of back pain after a fall yesterday. He was recently admitted for endocarditis, s/p open heart surgery on 5/12. He left AMA yesterday. encouraged by CT surgeon today to come back. Besides back pain, no other new sxs. Fall was when he was walking up stairs, fell back onto grass. Did not hit head or lose consciousness.   Review of Systems  Positive: Back pain Negative: headache  Physical Exam  BP 117/72 (BP Location: Right Arm)   Pulse (!) 102   Temp 99.4 F (37.4 C)   Resp 17   Ht '6\' 4"'$  (1.93 m)   Wt 131.5 kg   SpO2 100%   BMI 35.30 kg/m  Gen:   Awake, no distress   Resp:  Normal effort  MSK:   Moves extremities without difficulty. tto pf mid upper back. No deformities.  Other:  Large incision of the chest, healing well without bleeding  Medical Decision Making  Medically screening exam initiated at 3:40 PM.  Appropriate orders placed.  DYQUAN KRIMMER was informed that the remainder of the evaluation will be completed by another provider, this initial triage assessment does not replace that evaluation, and the importance of remaining in the ED until their evaluation is complete.  Basic labs and cxr ordered     Franchot Heidelberg, PA-C 08/08/20 1542    Sherwood Gambler, MD 08/10/20 1510

## 2020-08-08 NOTE — ED Triage Notes (Signed)
Pt stated that he had a aortic bypass on 5/12 and left AMA last night. Pt reports his doctor told him he needs to come back so pt is here to get re-admitted. Pt reports pain to the incision site on the chest. No abnormalities noted to incision. Pt also reports back pain after losing balance and falling onto his back this morning.

## 2020-08-08 NOTE — Significant Event (Signed)
Pt. Notified me 08/07/2020-2130 that he will be going AMA and needed to sign the paperwork stat! I immediately notified CTS-Dr. Ricard Dillon and discussed pt. Disposition. Dr. Ricard Dillon discussed the risks as well as the poor decision it would be to go home at present time. Mrs. Furno (Pts Mother) To come and pick pt.up  in front of [place] are stable with NSR on monitor at 75bpm. He is a [employer]15 and I as well discussed the importance of Lab collections, dialysis, central monitoring with [facility] cath care. Pt. States he is going no matter what! Dr. Mansy/Dr. Ginette Pitman aware of pt. Status. AMA form signed and placed in chart. All SL access discontinued as well as Monitors.   [

## 2020-08-08 NOTE — H&P (Addendum)
History and Physical    Alex [name] [id number] DOB: 06-12-1980 DOA: 08/08/2020  PCP: Loraine [facility] Patient coming from: Home  Chief Complaint: Back pain  HPI: Alex [name] is a 40 y.o. male with a past medical history of polysubstance abuse (nicotine, heroin, methamphetamine) on Suboxone.  Recently admitted on 4/26 for aortic valve endocarditis with perivalvular abscess, Serratia bacteremia and septic emboli causing strokes.  Hospital course was complicated by cardiogenic shock, acute hypoxemic respiratory failure requiring intubation, acute renal failure requiring dialysis, and anemia requiring blood transfusions.  He was extubated on 5/5 and admitted to hospitalist service.  Status post mechanical aortic valve replacement, aortic root construction and closure of aortic sinus tract on 5/12.  He was started on cefepime and Coumadin.  Patient left AMA yesterday and is now returning to the ED to be readmitted after being called by cardiothoracic surgery.  In the ED he complained of back pain after a fall yesterday.  Not febrile.  Labs showing WBC 10.6, hemoglobin 8.9 (stable), platelet count 266K.  Sodium 133, potassium 4.0, chloride 101, bicarb 25, BUN 35, creatinine 4.6, glucose 112.  No elevation of LFTs.  COVID and influenza PCR negative.  Chest x-ray showing similar-appearing moderate cardiomegaly and trace bilateral pleural effusions.  ED physician informed cardiothoracic surgery that the patient has returned to the hospital.  Recommended continuing antibiotic and anticoagulation.  Patient states he left the hospital yesterday [place].  When he reached home as he was trying to climb the steps on his patio he fell backwards on the grass.  He did not hit his head.  Since the fall, he is having pain in his mid back.  He is not having any problems urinating or defecating.  He was started on dialysis during his recent hospitalization but still  makes urine.  Denies saddle anesthesia or lower extremity weakness/numbness.  Also reports ongoing pain in his right knee since last month but does not recall injuring it.  States his knee pain makes it difficult for him to sleep at night.  No other complaints.  Denies fevers, chills, cough, shortness of breath, or chest pain.  Review of Systems:  All systems reviewed and apart from history of presenting illness, are negative.  Past Medical History:  Diagnosis Date  . Ruptured lumbar disc     Past Surgical History:  Procedure Laterality Date  . AORTIC/RENAL BYPASS    . BENTALL PROCEDURE N/A 08/03/2020   Procedure: BENTALL PROCEDURE USING ON-X AORTIC VALVE SIZE 23 MM AND PERI-GUARD 4CM X 4CM PERICARDIUM;  Surgeon: Lajuana Matte, MD;  Location: [facility];  Service: Open Heart Surgery;  Laterality: N/A;  . IR FLUORO GUIDE CV LINE RIGHT  08/01/2020  . IR US GUIDE VASC ACCESS RIGHT  08/01/2020  . TEE WITHOUT CARDIOVERSION N/A 08/03/2020   Procedure: TRANSESOPHAGEAL ECHOCARDIOGRAM (TEE);  Surgeon: Lajuana Matte, MD;  Location: [facility];  Service: Open Heart Surgery;  Laterality: N/A;     reports that he has been smoking cigarettes. He has been smoking about 1.00 pack per day. He has never used smokeless tobacco. He reports current drug use. Drug: Marijuana. He reports that he does not drink alcohol.  No Known Allergies  History reviewed. No pertinent family history.  Prior to Admission medications   Medication Sig Start Date End Date Taking? Authorizing Provider  lisinopril-hydrochlorothiazide (ZESTORETIC) 10-12.5 MG tablet Take 1 tablet by mouth daily. 11/12/19  Yes [provider]    Physical  Exam: Vitals:   08/08/20 2045 08/08/20 2100 08/08/20 2147 08/08/20 2156  BP: 126/75 115/78  109/75  Pulse: 85 92  77  Resp: '18 13  16  '$ Temp:    98.9 F (37.2 C)  TempSrc:    Oral  SpO2: 100% 98%  99%  Weight:   125.1 kg   Height:   '6\' 4"'$  (1.93 m)     Physical  Exam Constitutional:      General: He is not in acute distress. HENT:     Head: Normocephalic and atraumatic.  Eyes:     Extraocular Movements: Extraocular movements intact.     Conjunctiva/sclera: Conjunctivae normal.  Cardiovascular:     Rate and Rhythm: Normal rate and regular rhythm.     Pulses: Normal pulses.  Pulmonary:     Effort: Pulmonary effort is normal. No respiratory distress.     Breath sounds: No wheezing or rales.  Abdominal:     General: Bowel sounds are normal. There is no distension.     Palpations: Abdomen is soft.     Tenderness: There is no abdominal tenderness.  Musculoskeletal:     Cervical back: Normal range of motion and neck supple.     Comments: +1 pedal edema bilaterally Right knee: No obvious swelling and range of motion intact. Thoracic spine and paraspinal muscles tender to palpation  Skin:    General: Skin is warm and dry.  Neurological:     General: No focal deficit present.     Mental Status: He is alert and oriented to person, place, and time.     Sensory: No sensory deficit.     Motor: No weakness.     Comments: No lower extremity weakness or sensory deficit      Labs on Admission: I have personally reviewed following labs and imaging studies  CBC: Recent Labs  Lab 08/04/20 0459 08/04/20 1600 08/05/20 0254 08/07/20 0153 08/07/20 1746 08/08/20 1539  WBC 11.9* 7.6 7.5 7.3  --  10.6*  HGB 6.9* 6.1* 6.6* 6.6* 8.4* 8.9*  HCT 21.0* 18.3* 19.7* 20.7* 25.2* 28.6*  MCV 86.1 85.5 85.7 88.5  --  90.5  PLT 137* PLATELET CLUMPS NOTED ON SMEAR, UNABLE TO ESTIMATE 100* 149*  --  [id number]   Basic Metabolic Panel: Recent Labs  Lab 08/04/20 0459 08/04/20 1600 08/05/20 0254 08/05/20 1623 08/06/20 0419 08/06/20 1605 08/07/20 0153 08/07/20 1746 08/08/20 1540  NA 131* 129* 130* 132* 131* 131* 129* 133* 133*  K 5.4* 4.4 3.9 3.4* 4.2 4.0 4.2 4.4 4.0  CL 102 99 100 100 99 100 99 100 101  CO2 20* '23 24 25 25 22 22 26 25  '$ GLUCOSE 108* 102* 91  103* 80 92 74 106* 112*  BUN 43* 33* 27* 23* 31* 41* 46* 22* 35*  CREATININE 3.95* 3.22* 2.93* 2.55* 3.53* 4.56* 4.96* 3.25* 4.62*  CALCIUM 7.4* 7.4* 7.6* 7.4* 7.9* 7.6* 7.7* 7.6* 7.9*  MG 3.0* 2.8* 2.7*  --  2.6*  --  2.7*  --   --   PHOS 4.3 3.5 4.0 3.8 4.8* 5.4* 6.1* 3.2  --    GFR: Estimated Creatinine Clearance: 31.6 mL/min (A) (by C-G formula based on SCr of 4.62 mg/dL (H)). Liver Function Tests: Recent Labs  Lab 08/06/20 0419 08/06/20 1605 08/07/20 0153 08/07/20 1746 08/08/20 1540  AST  --   --   --   --  24  ALT  --   --   --   --  <5  ALKPHOS  --   --   --   --  54  BILITOT  --   --   --   --  0.9  PROT  --   --   --   --  6.6  ALBUMIN 1.8* 1.8* 1.8* 1.9* 2.0*   No results for input(s): LIPASE, AMYLASE in the last 168 hours. No results for input(s): AMMONIA in the last 168 hours. Coagulation Profile: Recent Labs  Lab 08/03/20 1604 08/05/20 0254 08/06/20 0419 08/07/20 0153  INR 1.6* 1.6* 3.0* 3.9*   Cardiac Enzymes: No results for input(s): CKTOTAL, CKMB, CKMBINDEX, TROPONINI in the last 168 hours. BNP (last 3 results) No results for input(s): PROBNP in the last 8760 hours. HbA1C: No results for input(s): HGBA1C in the last 72 hours. CBG: Recent Labs  Lab 08/05/20 1128 08/05/20 1550 08/05/20 2148 08/06/20 0604 08/06/20 1136  GLUCAP 109* 117* 95 74 94   Lipid Profile: No results for input(s): CHOL, HDL, LDLCALC, TRIG, CHOLHDL, LDLDIRECT in the last 72 hours. Thyroid Function Tests: No results for input(s): TSH, T4TOTAL, FREET4, T3FREE, THYROIDAB in the last 72 hours. Anemia Panel: No results for input(s): VITAMINB12, FOLATE, FERRITIN, TIBC, IRON, RETICCTPCT in the last 72 hours. Urine analysis:    Component Value Date/Time   COLORURINE AMBER (A) 07/18/2020 0702   APPEARANCEUR TURBID (A) 07/18/2020 0702   LABSPEC 1.027 07/18/2020 0702   PHURINE 5.0 07/18/2020 0702   GLUCOSEU NEGATIVE 07/18/2020 0702   HGBUR MODERATE (A) 07/18/2020 0702    BILIRUBINUR SMALL (A) 07/18/2020 0702   KETONESUR NEGATIVE 07/18/2020 0702   PROTEINUR 100 (A) 07/18/2020 0702   NITRITE NEGATIVE 07/18/2020 0702   LEUKOCYTESUR TRACE (A) 07/18/2020 0702    Radiological Exams on Admission: DG Chest 2 View  Result Date: 08/08/2020 CLINICAL DATA:  40 year old male with history of recent open heart surgery. EXAM: CHEST - 2 VIEW COMPARISON:  08/06/2020 FINDINGS: The mediastinal contours are within normal limits. Unchanged moderate cardiomegaly. Status post aortic valve replacement. Right IJ tunneled hemodialysis catheter remains in place with the tip in the central aspect of the superior vena cava. Interval removal of left internal jugular central line. Trace bilateral pleural effusions. The lungs are otherwise clear bilaterally without evidence of focal consolidation or pneumothorax. Median sternotomy wires are intact. IMPRESSION: Similar appearing moderate cardiomegaly with trace bilateral pleural effusions. Electronically Signed   By: [name] MD   On: 08/08/2020 16:19    EKG: Independently reviewed.  Sinus rhythm, artifact in V1.  Assessment/Plan Principal Problem:   Endocarditis Active Problems:   Opioid use disorder   Acute renal failure with oliguria (HCC)   Cerebral embolism with cerebral infarction   Anemia   Aortic valve endocarditis with perivalvular abscess, Serratia bacteremia and septic emboli causing strokes -MRI brain done 4/30 revealed dozens of primary punctate acute infarcts scattered throughout the cerebellum and both cerebral hemispheres.  Several are associated with petechial blood products. -Status post mechanical aortic valve replacement, aortic root construction and closure of aortic sinus track on 5/12 -Infectious disease recommended 6 weeks of cefepime starting 5/13.  Continue cefepime. -Continue Coumadin per pharmacy with goal INR 2.5-3.5  Back pain Complaining of mid back pain after a fall yesterday.  No red flag symptoms.   No lower extremity weakness or sensory deficit.  Thoracic spine and paraspinal muscles tender to palpation.  Back pain likely musculoskeletal in nature. -X-ray ordered to rule out fracture/acute osseous pathology.  Pain management.  Right knee pain No obvious swelling  or deformity.  Range of motion intact. -X-ray ordered to rule out effusion/acute osseous pathology  AKI -Suspected to be secondary to ATN related to septic shock during recent hospitalization.  He required CRRT which was started on 4/28.  He pulled out his HD cath on 5/5 and 5/8.  He continues to require hemodialysis MWF. -Creatinine was 3.2 yesterday and up to 4.6 today.  Has mild peripheral edema but no pulmonary edema on chest x-ray.  Potassium and bicarb normal.  Consult nephrology in the morning for dialysis.  Anemia -Status post 6 units PRBC during recent hospitalization.  Hemoglobin is stable at 8.9 today.  Continue to monitor.  DVT prophylaxis: Coumadin Code Status: Full code Family Communication: No family at bedside. Disposition Plan: Status is: Inpatient  Remains inpatient appropriate because:Inpatient level of care appropriate due to severity of illness   Dispo: The patient is from: Home              Anticipated d/c is to: Home              Patient currently is not medically stable to d/c.   Difficult to place patient No  Level of care: Level of care: Progressive   The medical decision making on this patient was of high complexity and the patient is at high risk for clinical deterioration, therefore this is a level 3 visit.  Shela Leff MD [facility]  If 7PM-7AM, please contact night-coverage [email or web address]  08/08/2020, 10:49 PM

## 2020-08-08 NOTE — ED Provider Notes (Signed)
[facility] 4E CV SURGICAL PROGRESSIVE CARE Provider Note   CSN: [id number] Arrival date & time: 08/08/20  1510     History Chief Complaint  Patient presents with  . Post -Op Open Heart Surgery    Alex [name] is a 40 y.o. male.  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  Patient reportedly left AMA yesterday.  Patient was originally admitted to the ICU but downgraded to the medical service, receiving IV antibiotics and anticoagulation as well as dialysis.  Patient states that he is stupid and decided to leave for unknown reasons.  He is back today because he is concerned he may die.  He has discomfort at the incision site but denies any other acute complaints.  No fever.  Past Medical History:  Diagnosis Date  . Ruptured lumbar disc     Patient Active Problem List   Diagnosis Date Noted  . Endocarditis 08/08/2020  . Cerebral embolism with cerebral infarction 07/24/2020  . 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 Aortic valve endocarditis   . Pressure injury of skin 07/20/2020  . Elevated LFTs   . Sepsis ([place]) 07/18/2020  . Septic shock ([place]) 07/18/2020  . Altered mental status 07/18/2020  . Acute renal failure with oliguria ([place])   . Hypotension   . Elevated bilirubin   . Thrombocytopenia ([place])   . Plantar fasciitis, left 07/12/2020  . Essential hypertension 01/14/2020  . 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 Nicotine dependence, cigarettes, uncomplicated [id number]  . Opioid dependence on agonist therapy ([place]) 09/24/2017  . Opioid use disorder 09/24/2017    Past Surgical History:  Procedure Laterality Date  . AORTIC/RENAL BYPASS    . BENTALL PROCEDURE N/A 08/03/2020   Procedure: BENTALL PROCEDURE USING ON-X AORTIC VALVE SIZE 23 MM AND PERI-GUARD 4CM X 4CM PERICARDIUM;  Surgeon: Lajuana Matte,  MD;  Location: [facility];  Service: Open Heart Surgery;  Laterality: N/A;  . IR FLUORO GUIDE CV LINE RIGHT  08/01/2020  . IR US GUIDE VASC ACCESS RIGHT  08/01/2020  . TEE WITHOUT CARDIOVERSION N/A 08/03/2020   Procedure: TRANSESOPHAGEAL ECHOCARDIOGRAM (TEE);  Surgeon: Lajuana Matte, MD;  Location: [facility];  Service: Open Heart Surgery;  Laterality: N/A;       History reviewed. No pertinent family history.  Social History   Tobacco Use  . Smoking status: Current Every Day Smoker    Packs/day: 1.00    Types: Cigarettes  . Smokeless tobacco: Never Used  Vaping Use  . Vaping Use: Never used  Substance Use Topics  . Alcohol use: No  . Drug use: Yes    Types: Marijuana    Comment: denies use 08/24/17    Home Medications Prior to Admission medications   Medication Sig Start Date End Date Taking? Authorizing Provider  lisinopril-hydrochlorothiazide (ZESTORETIC) 10-12.5 MG tablet Take 1 tablet by mouth daily. 11/12/19  Yes [provider]    Allergies    Patient has no known allergies.  Review of Systems   Review of Systems  Constitutional: Negative for chills and fever.  HENT: Negative for congestion.   Eyes: Negative for visual disturbance.  Respiratory: Negative for shortness of breath.   Cardiovascular: Positive for chest pain.  Gastrointestinal: Negative for abdominal pain, diarrhea and vomiting.  Genitourinary: Negative for dysuria.  Skin: Negative for rash.  Neurological: Negative for headaches.  Physical Exam Updated Vital Signs BP 109/75 (BP Location: Left Arm)   Pulse 77   Temp 98.9 F (37.2 C) (Oral)   Resp 16   Ht '6\' 4"'$  (1.93 m)   Wt 125.1 kg   SpO2 99%   BMI 33.57 kg/m   Physical Exam Vitals and nursing note reviewed.  Constitutional:      Appearance: Normal appearance.  HENT:     Head: Normocephalic.     Mouth/Throat:     Mouth: Mucous membranes are moist.  Cardiovascular:     Rate and Rhythm: Normal rate.     Comments: Midline  sternotomy scar that appears appropriate, port Pulmonary:     Effort: Pulmonary effort is normal. No respiratory distress.  Abdominal:     Palpations: Abdomen is soft.     Tenderness: There is no abdominal tenderness.  Skin:    General: Skin is warm.  Neurological:     Mental Status: He is alert and oriented to person, place, and time. Mental status is at baseline.  Psychiatric:        Mood and Affect: Mood normal.     ED Results / Procedures / Treatments   Labs (all labs ordered are listed, but only abnormal results are displayed) Labs Reviewed  CBC - Abnormal; Notable for the following components:      Result Value   WBC 10.6 (*)    RBC 3.16 (*)    Hemoglobin 8.9 (*)    HCT 28.6 (*)    RDW 16.6 (*)    All other components within normal limits  COMPREHENSIVE METABOLIC PANEL - Abnormal; Notable for the following components:   Sodium 133 (*)    Glucose, Bld 112 (*)    BUN 35 (*)    Creatinine, Ser 4.62 (*)    Calcium 7.9 (*)    Albumin 2.0 (*)    GFR, Estimated 16 (*)    All other components within normal limits  RESP PANEL BY RT-PCR (FLU A&B, COVID) [id number]    EKG EKG Interpretation  Date/Time:  Tuesday Aug 08 2020 15:27:59 EDT Ventricular Rate:  100 PR Interval:  130 QRS Duration: 88 QT Interval:  364 QTC Calculation: 469 R Axis:   62 Text Interpretation: Normal sinus rhythm Cannot rule out Inferior infarct , age undetermined Abnormal ECG NSR, artifact in v1 Confirmed by Lavenia Atlas [phone]) on 08/08/2020 5:29:33 PM   Radiology DG Chest 2 View  Result Date: 08/08/2020 CLINICAL DATA:  40 year old male with history of recent open heart surgery. EXAM: CHEST - 2 VIEW COMPARISON:  08/06/2020 FINDINGS: The mediastinal contours are within normal limits. Unchanged moderate cardiomegaly. Status post aortic valve replacement. Right IJ tunneled hemodialysis catheter remains in place with the tip in the central aspect of the superior vena cava. Interval removal of left  internal jugular central line. Trace bilateral pleural effusions. The lungs are otherwise clear bilaterally without evidence of focal consolidation or pneumothorax. Median sternotomy wires are intact. IMPRESSION: Similar appearing moderate cardiomegaly with trace bilateral pleural effusions. Electronically Signed   By: [name] MD   On: 08/08/2020 16:19    Procedures Procedures   Medications Ordered in ED Medications - No data to display  ED Course  I have reviewed the triage vital signs and the nursing notes.  Pertinent labs & imaging results that were available during my care of the patient were reviewed by me and considered in my medical decision making (see chart for details).    MDM Rules/Calculators/A&P  40 year old male presents emergency department for admission.  He recently left AMA from the medical service yesterday after surgery for endocarditis and abscess.  Cardiothoracic team was following.  Spoke with on-call cardiologist who recommends medical admission and continuation of his care.  Their service is aware and will follow.  Patient will be admitted to the medical service to continue his treatment.  Vitals here are stable.  Blood work is baseline with slightly worse kidney dysfunction which is to be expected.  Patients evaluation and results requires admission for further treatment and care. Patient agrees with admission plan, offers no new complaints and is stable/unchanged at time of admit.  Final Clinical Impression(s) / ED Diagnoses Final diagnoses:  Endocarditis, unspecified chronicity, unspecified endocarditis type    Rx / DC Orders ED Discharge Orders    None       Lorelle Gibbs, DO 08/08/20 2228

## 2020-08-09 ENCOUNTER — Inpatient Hospital Stay (HOSPITAL_COMMUNITY): Payer: 59

## 2020-08-09 ENCOUNTER — Telehealth: Payer: Self-pay

## 2020-08-09 DIAGNOSIS — R609 Edema, unspecified: Secondary | ICD-10-CM

## 2020-08-09 DIAGNOSIS — I339 Acute and subacute endocarditis, unspecified: Secondary | ICD-10-CM | POA: Diagnosis not present

## 2020-08-09 LAB — BASIC METABOLIC PANEL
Anion gap: 7 (ref 5–15)
BUN: 38 mg/dL — ABNORMAL HIGH (ref 6–20)
CO2: 24 mmol/L (ref 22–32)
Calcium: 7.6 mg/dL — ABNORMAL LOW (ref 8.9–10.3)
Chloride: 101 mmol/L (ref 98–111)
Creatinine, Ser: 4.9 mg/dL — ABNORMAL HIGH (ref 0.61–1.24)
GFR, Estimated: 15 mL/min — ABNORMAL LOW (ref >60)
Glucose, Bld: 118 mg/dL — ABNORMAL HIGH (ref 70–99)
Potassium: 3.7 mmol/L (ref 3.5–5.1)
Sodium: 132 mmol/L — ABNORMAL LOW (ref 135–145)

## 2020-08-09 LAB — PROTIME-INR
INR: 2.6 — ABNORMAL HIGH (ref 0.8–1.2)
Prothrombin Time: 27.5 seconds — ABNORMAL HIGH (ref 11.4–15.2)

## 2020-08-09 LAB — CBC
HCT: 24.6 % — ABNORMAL LOW (ref 39.0–52.0)
Hemoglobin: 7.7 g/dL — ABNORMAL LOW (ref 13.0–17.0)
MCH: 27.8 pg (ref 26.0–34.0)
MCHC: 31.3 g/dL (ref 30.0–36.0)
MCV: 88.8 fL (ref 80.0–100.0)
Platelets: 228 10*3/uL (ref 150–400)
RBC: 2.77 MIL/uL — ABNORMAL LOW (ref 4.22–5.81)
RDW: 16.4 % — ABNORMAL HIGH (ref 11.5–15.5)
WBC: 8.8 10*3/uL (ref 4.0–10.5)
nRBC: 0 % (ref 0.0–0.2)

## 2020-08-09 MED ORDER — WARFARIN SODIUM 2.5 MG PO TABS
2.5000 mg | ORAL_TABLET | Freq: Every day | ORAL | Status: DC
  Administered 2020-08-09: 2.5 mg via ORAL
  Filled 2020-08-09: qty 1

## 2020-08-09 MED ORDER — BUPRENORPHINE HCL-NALOXONE HCL 8-2 MG SL SUBL
1.0000 | SUBLINGUAL_TABLET | Freq: Two times a day (BID) | SUBLINGUAL | Status: DC
  Administered 2020-08-10 – 2020-09-15 (×73): 1 via SUBLINGUAL
  Filled 2020-08-09 (×73): qty 1

## 2020-08-09 MED ORDER — METHOCARBAMOL 500 MG PO TABS
500.0000 mg | ORAL_TABLET | Freq: Three times a day (TID) | ORAL | Status: DC
  Administered 2020-08-09 – 2020-09-15 (×111): 500 mg via ORAL
  Filled 2020-08-09 (×112): qty 1

## 2020-08-09 MED ORDER — MELATONIN 3 MG PO TABS
3.0000 mg | ORAL_TABLET | Freq: Every day | ORAL | Status: DC
  Administered 2020-08-09 – 2020-09-14 (×35): 3 mg via ORAL
  Filled 2020-08-09 (×35): qty 1

## 2020-08-09 MED ORDER — ONDANSETRON HCL 4 MG/2ML IJ SOLN
4.0000 mg | Freq: Four times a day (QID) | INTRAMUSCULAR | Status: DC | PRN
  Administered 2020-09-13: 4 mg via INTRAVENOUS
  Filled 2020-08-09 (×2): qty 2

## 2020-08-09 MED ORDER — BUPRENORPHINE HCL-NALOXONE HCL 2-0.5 MG SL SUBL: 2.0000 | SUBLINGUAL_TABLET | SUBLINGUAL | Status: AC | PRN

## 2020-08-09 MED ORDER — CHLORHEXIDINE GLUCONATE CLOTH 2 % EX PADS
6.0000 | MEDICATED_PAD | Freq: Every day | CUTANEOUS | Status: DC
  Administered 2020-08-09 – 2020-09-15 (×28): 6 via TOPICAL

## 2020-08-09 MED ORDER — SENNOSIDES-DOCUSATE SODIUM 8.6-50 MG PO TABS: 1.0000 | ORAL_TABLET | Freq: Every evening | ORAL | Status: DC | PRN

## 2020-08-09 MED ORDER — WARFARIN - PHARMACIST DOSING INPATIENT: Freq: Every day | Status: DC

## 2020-08-09 MED ORDER — ONDANSETRON HCL 4 MG PO TABS
4.0000 mg | ORAL_TABLET | Freq: Four times a day (QID) | ORAL | Status: DC | PRN
  Administered 2020-09-03 – 2020-09-05 (×2): 4 mg via ORAL
  Filled 2020-08-09 (×2): qty 1

## 2020-08-09 MED ORDER — SODIUM CHLORIDE 0.9 % IV SOLN
1.0000 g | INTRAVENOUS | Status: DC
  Administered 2020-08-09 – 2020-08-12 (×4): 1 g via INTRAVENOUS
  Filled 2020-08-09 (×5): qty 1

## 2020-08-09 MED ORDER — CLONAZEPAM 0.5 MG PO TABS
0.5000 mg | ORAL_TABLET | Freq: Two times a day (BID) | ORAL | Status: DC | PRN
  Administered 2020-08-11 – 2020-09-10 (×45): 0.5 mg via ORAL
  Filled 2020-08-09 (×47): qty 1

## 2020-08-09 NOTE — Progress Notes (Signed)
     [address]       [place] [address]             [phone]       Pt well known to our service.  He left AMA following mechanical AVR, annular reconstruction for endocarditis, with root abscess.    Will need to continue coumadin for goal INR of 2.5-3.5.   Will continue to follow.  Chistina Roston Bary Leriche

## 2020-08-09 NOTE — Progress Notes (Signed)
Admit: 08/08/2020 LOS: 1  2M AV IE and perivalvular abscess, serrata sp.   AVR repair 5/12;  Anuric AKI from ATN-  Normal crt in Feb 2022 then 5 on 07/18/20 in the setting of IE  S: The patient left AMA on 5/16.  He states that he was cured.  Now returns for ongoing treatment.  States that he did not use any IV drugs while he was gone.  Did smoke some marijuana.  Has not tampered with his dialysis catheter.  Has been urinating several times  O: 05/17 0701 - 05/18 0700 In: 650 [P.O.:550; IV Piggyback:100] Out: -    Vitals:   08/09/20 0417 08/09/20 0828  BP: 119/81 (!) 102/45  Pulse: 95 93  Resp: (!) 22 14  Temp: 97.9 F (36.6 C) 98.2 F (36.8 C)  SpO2: 99% 100%     GEN: Lying in bed, no distress ENT: no nasal discharge, mmm, poor dentition EYES: no scleral icterus, eomi CV: normal rate,  PULM: no iwob, bilateral chest rise ABD: NABS, non-distended SKIN: no rashes or jaundice EXT: 2+ pitting edema in the bilateral lower extremities, warm and well perfused   Filed Weights   08/08/20 1528 08/08/20 2147  Weight: 131.5 kg 125.1 kg    Recent Labs  Lab 08/06/20 1605 08/07/20 0153 08/07/20 1746 08/08/20 1540 08/09/20 0212  NA 131* 129* 133* 133* 132*  K 4.0 4.2 4.4 4.0 3.7  CL 100 99 100 101 101  CO2 '22 22 26 25 24  '$ GLUCOSE 92 74 106* 112* 118*  BUN 41* 46* 22* 35* 38*  CREATININE 4.56* 4.96* 3.25* 4.62* 4.90*  CALCIUM 7.6* 7.7* 7.6* 7.9* 7.6*  PHOS 5.4* 6.1* 3.2  --   --    Recent Labs  Lab 08/07/20 0153 08/07/20 1746 08/08/20 1539 08/09/20 0212  WBC 7.3  --  10.6* 8.8  HGB 6.6* 8.4* 8.9* 7.7*  HCT 20.7* 25.2* 28.6* 24.6*  MCV 88.5  --  90.5 88.8  PLT 149*  --  266 228    Scheduled Meds: . Chlorhexidine Gluconate Cloth  6 each Topical Daily  . lidocaine  1 patch Transdermal Q24H  . melatonin  3 mg Oral QHS   Continuous Infusions: . ceFEPime (MAXIPIME) IV Stopped (08/09/20 0129)   PRN Meds:.acetaminophen **OR** acetaminophen, clonazePAM, ondansetron  **OR** ondansetron (ZOFRAN) IV, senna-docusate  ABG    Component Value Date/Time   PHART 7.405 08/03/2020 2033   PCO2ART 31.4 (L) 08/03/2020 2033   PO2ART 81 (L) 08/03/2020 2033   HCO3 19.6 (L) 08/03/2020 2033   TCO2 21 (L) 08/03/2020 2033   ACIDBASEDEF 4.0 (H) 08/03/2020 2033   O2SAT 96.0 08/03/2020 2033    A/P  1. Dialysis dependent anuric AKI off and on CRRT/RRT since 4/28-  crt was normal in Feb 2022.   CRRT re initiated s/p surgery on 5/12- stopped on 5/14.  Last had IHD on 5/16.  Now making some urine possibly with improvement in kidney function.  We will continue to monitor and hold dialysis for now.  Maintain dialysis catheter 2. Hyperkalemia   intermittent issue.  Stable at this point likely reflective of improving kidney function 3. 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  Encouraged him to remain inpatient 4. VDRF, resolved, normal intermittently on BiPAP--- extubated  5. Anemia, transfusion per CCM-  On aranesp qwed-  Has been a chronic issue-  Needing many transfusions.  Hemoglobin 7.7 today.  Continue  transfusions as needed 6. Hyponatremia: Sodium 132 likely related to volume excess.  Continue to monitor    [facility]

## 2020-08-09 NOTE — Progress Notes (Signed)
[facility] Progress Note  Patient: Alex [name]    [id number]  DOA: 08/08/2020     Date of Service: the patient was seen and examined on 08/09/2020  Brief hospital course: Past medical history of polysubstance abuse, aortic valve endocarditis with recent AVR, Serratia bacteremia, septic emboli causing stroke and acute kidney injury requiring HD.  Left the hospital AMA on 5/17 and came back the same night after discussion with cardiothoracic surgery.  4/26 transfer from [facility] to [facility]  CT head 4/26 >> no acute findings  CT abd/pelvis 4/26 >> cholelithiasis w/o inflammatory chagnes, mild hepatomegaly (25 cm), moderate splenomegaly (16.7 cm), 2 mm non obstructing kidney stone on Lt  4/27 CVL placed. Pressors escalating.   4/28 intubated for TEE, CVC, HD, and A-line placed. CRRT started   Remains on CRRT  MRI on 4/30-multiple infarcts  5/4: repeat TEE shows an aortic valve abscess  5/5: extubated , self-removed HD cath so CRRT holiday> developed severe hyperkalemia overnight.  5/6: new temp HD cath placed   5/8: new temp HD cath placed after prior one self-removed by patient  5/9: Orthopantogram neg for abscess  5/10: HD cath replaced by IR [facility]  5/12 to OR for AVR, aortic root reconstruction, closure of aortic sinus track. To 2H ICU on vent post op. 4 units PRBC given intraop.   5/13 extubated, on CRRT, remains on pressors  5/18, left AMA.  Currently plan is work on disposition.  Assessment and Plan: Aortic valve endocarditiswith perivalvular abscess, Serratia bacteremia and septic emboli causing strokes -MRI brain 07/22/20 revealed dozens of primary punctate acute infarcts scattered throughout the cerebellum and both cerebral hemispheres-several are associated with petechial blood products -Status postmechanicalaortic valve replacement, aortic root reconstruction and closure of aortic sinus track -Needs 6 weeks of cefepime per ID with start date being  5/13 -Continue Coumadin per pharmacy to keep INR 2.5-3.5 -He is able to ambulate without difficulty  Recent cardiogenic shock, elevated total bilirubin and acute hypoxemic respiratory failure requiring intubation -Secondary to above  AKI, electrolyte abnormalities -Suspected to be secondary to ATN related to septic shock -Required CRRT which was started on 4/28 -He pulled out his HD catheter on 5/5 and 5/8 -He continues to require hemodialysis Monday Wednesday Friday-beginning to urinate Nephrology reconsulted. Currently does not need acute HD.  Monitor.  Anemia/thrombocytopenia secondary to acute infection -Status post 6units of PRBC during his last hospital stay -Hemoglobin stable.  Transfuse as needed.  Thrombocytopenia  steadily improving Right lower extremity Doppler shows edema.  No evidence of DVT.  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 Patient is on sliding scale insulin.  Was on carb modified diet during last admission.  Hemoglobin A1c was 5.7. I do not think the patient requires carb modified diet.  We will monitor.  Obesity. placing the patient at risk for poor outcome. Body mass index is 33.57 kg/m.   Diet: Ideally patient should be on renal diet but to be on the least restrictive diet. DVT Prophylaxis:    warfarin (COUMADIN) tablet 2.5 mg    Advance goals of care discussion: Full code  Family Communication: family was present at bedside, at the time of interview.  The pt provided permission to discuss medical plan with the family. Opportunity was given to ask question and all questions were answered satisfactorily.   Disposition:  Status is: Inpatient  Remains inpatient appropriate because:Unsafe d/c plan  Dispo: The patient is  from: Home              Anticipated d/c is to: Home              Patient currently is not medically stable  to d/c.   Difficult to place patient No  Subjective: Continues to report right knee pain.  This pain started during his recent hospitalization.  Requesting to change his diet.  No nausea no vomiting.   Physical Exam:  General: Appear in mild distress, right knee macular diffuse rash; Oral Mucosa Clear, moist. no Abnormal Neck Mass Or lumps, Conjunctiva normal  Cardiovascular: S1 and S2 Present, aortic systolic Murmur, Respiratory: good respiratory effort, Bilateral Air entry present and CTA, no Crackles, no wheezes Abdomen: Bowel Sound present, Soft and no tenderness Extremities: Bilateral right more than left pedal edema No evidence of warmth or inflammation. Neurology: alert and oriented to time, place, and person affect appropriate. no new focal deficit Gait not checked due to patient safety concerns    Vitals:   08/09/20 0417 08/09/20 0828 08/09/20 1133 08/09/20 1545  BP: 119/81 (!) 102/45 122/76 114/73  Pulse: 95 93 82 94  Resp: (!) '22 14 20 20  '$ Temp: 97.9 F (36.6 C) 98.2 F (36.8 C) (!) 97.3 F (36.3 C) 98 F (36.7 C)  TempSrc: Oral Oral Oral Oral  SpO2: 99% 100%    Weight:      Height:        Intake/Output Summary (Last 24 hours) at 08/09/2020 1955 Last data filed at 08/09/2020 1549 Gross per 24 hour  Intake 1305 ml  Output 228 ml  Net 1077 ml   Filed Weights   08/08/20 1528 08/08/20 2147  Weight: 131.5 kg 125.1 kg    Data Reviewed: I have personally reviewed and interpreted daily labs, tele strips, imaging. I reviewed all nursing notes, pharmacy notes, vitals, pertinent old records I have discussed plan of care as described above with RN and patient/family.  CBC: Recent Labs  Lab 08/04/20 1600 08/05/20 0254 08/07/20 0153 08/07/20 1746 08/08/20 1539 08/09/20 0212  WBC 7.6 7.5 7.3  --  10.6* 8.8  HGB 6.1* 6.6* 6.6* 8.4* 8.9* 7.7*  HCT 18.3* 19.7* 20.7* 25.2* 28.6* 24.6*  MCV 85.5 85.7 88.5  --  90.5 88.8  PLT PLATELET CLUMPS NOTED ON SMEAR, UNABLE  TO ESTIMATE 100* 149*  --  266 [id number]   Basic Metabolic Panel: Recent Labs  Lab 08/04/20 0459 08/04/20 1600 08/05/20 0254 08/05/20 1623 08/06/20 0419 08/06/20 1605 08/07/20 0153 08/07/20 1746 08/08/20 1540 08/09/20 0212  NA 131* 129* 130* 132* 131* 131* 129* 133* 133* 132*  K 5.4* 4.4 3.9 3.4* 4.2 4.0 4.2 4.4 4.0 3.7  CL '102 99 100 100 99 100 99 100 101 101 '$  CO2 20* '23 24 25 25 22 22 26 25 24  '$ GLUCOSE 108* 102* 91 103* 80 92 74 106* 112* 118*  BUN 43* 33* 27* 23* 31* 41* 46* 22* 35* 38*  CREATININE 3.95* 3.22* 2.93* 2.55* 3.53* 4.56* 4.96* 3.25* 4.62* 4.90*  CALCIUM 7.4* 7.4* 7.6* 7.4* 7.9* 7.6* 7.7* 7.6* 7.9* 7.6*  MG 3.0* 2.8* 2.7*  --  2.6*  --  2.7*  --   --   --   PHOS 4.3 3.5 4.0 3.8 4.8* 5.4* 6.1* 3.2  --   --     Studies: DG Thoracic Spine 2 View  Result Date: 08/08/2020 CLINICAL DATA:  Back pain, fall EXAM: THORACIC SPINE 2 VIEWS COMPARISON:  None. FINDINGS:  Normal thoracic kyphosis. No acute fracture or listhesis of the thoracic spine. Vertebral body height has been preserved. Mild endplate changes are noted within the midthoracic spine in keeping with changes of mild degenerative disc disease. Intervertebral disc heights are preserved. The paraspinal soft tissues are unremarkable. Incidental note is made of a median sternotomy and right internal jugular hemodialysis catheter placement with its tip within the superior right atrium. Cardiac size is mildly enlarged. IMPRESSION: No acute fracture or listhesis of the thoracic spine. Minimal degenerative change. Electronically Signed   By: Fidela Salisbury MD   On: 08/08/2020 23:58   DG Knee 1-2 Views Right  Result Date: 08/08/2020 CLINICAL DATA:  Fall, right knee pain EXAM: RIGHT KNEE - 1-2 VIEW COMPARISON:  None. FINDINGS: No evidence of fracture, dislocation, or joint effusion. No evidence of arthropathy or other focal bone abnormality. Soft tissues are unremarkable. IMPRESSION: Negative. Electronically Signed   By: Fidela Salisbury MD   On: 08/08/2020 23:59   VAS [place] LOWER EXTREMITY VENOUS (DVT)  Result Date: 08/09/2020  Lower Venous DVT Study Patient Name:  Alex [name]  Date of Exam:   08/09/2020 Medical Rec #: [id number]          Accession #:    [id number] Date of Birth: [birth year]          Patient Gender: M Patient Age:   71Y Exam Location:  [facility] Procedure:      VAS [place] LOWER EXTREMITY VENOUS (DVT) Referring Phys: [id number] [facility] M Thelma Lorenzetti --------------------------------------------------------------------------------  Indications: Edema.  Comparison Study: 07/25/20 prior Performing Technologist: Abram Sander RVS  Examination Guidelines: A complete evaluation includes B-mode imaging, spectral Doppler, color Doppler, and power Doppler as needed of all accessible portions of each vessel. Bilateral testing is considered an integral part of a complete examination. Limited examinations for reoccurring indications may be performed as noted. The reflux portion of the exam is performed with the patient in reverse Trendelenburg.  +---------+---------------+---------+-----------+----------+--------------+ RIGHT    CompressibilityPhasicitySpontaneityPropertiesThrombus Aging +---------+---------------+---------+-----------+----------+--------------+ CFV      Full           Yes      Yes                                 +---------+---------------+---------+-----------+----------+--------------+ SFJ      Full                                                        +---------+---------------+---------+-----------+----------+--------------+ FV Prox  Full                                                        +---------+---------------+---------+-----------+----------+--------------+ FV Mid   Full                                                        +---------+---------------+---------+-----------+----------+--------------+ FV DistalFull                                                         +---------+---------------+---------+-----------+----------+--------------+  PFV      Full                                                        +---------+---------------+---------+-----------+----------+--------------+ POP      Full           Yes      Yes                                 +---------+---------------+---------+-----------+----------+--------------+ PTV      Full                                                        +---------+---------------+---------+-----------+----------+--------------+ PERO     Full                                                        +---------+---------------+---------+-----------+----------+--------------+   +----+---------------+---------+-----------+----------+--------------+ LEFTCompressibilityPhasicitySpontaneityPropertiesThrombus Aging +----+---------------+---------+-----------+----------+--------------+ CFV Full           Yes      Yes                                 +----+---------------+---------+-----------+----------+--------------+     Summary: RIGHT: - There is no evidence of deep vein thrombosis in the lower extremity.  - No cystic structure found in the popliteal fossa.  LEFT: - No evidence of common femoral vein obstruction.  *See table(s) above for measurements and observations.    Preliminary     Scheduled Meds: . [START ON 08/10/2020] buprenorphine-naloxone  1 tablet Sublingual BID  . Chlorhexidine Gluconate Cloth  6 each Topical Daily  . lidocaine  1 patch Transdermal Q24H  . melatonin  3 mg Oral QHS  . methocarbamol  500 mg Oral TID  . warfarin  2.5 mg Oral q1600  . Warfarin - Pharmacist Dosing Inpatient   Does not apply q1600   Continuous Infusions: . ceFEPime (MAXIPIME) IV     PRN Meds: acetaminophen **OR** acetaminophen, buprenorphine-naloxone, clonazePAM, ondansetron **OR** ondansetron (ZOFRAN) IV, senna-docusate  Time spent: 35 minutes  Author: Berle Mull, MD [facility] 08/09/2020  7:55 PM  To reach On-call, see care teams to locate the attending and reach out via [email or web address]. Between 7PM-7AM, please contact night-coverage If you still have difficulty reaching the attending provider, please page the [facility] (Director on Call) for [facility] on amion for assistance.

## 2020-08-09 NOTE — Telephone Encounter (Signed)
-----   Message from Carlean Purl, RN sent at 08/08/2020  8:44 AM EDT -----  ----- Message ----- From: Carlyle Basques, MD Sent: 08/08/2020   8:23 AM EDT To: [facility] Triage Nurse Pool  Can we see him 10 days. Doesn't matter who.Marland Kitchen

## 2020-08-09 NOTE — Telephone Encounter (Signed)
Left patient a voice mail to call back to schedule a 10 day hospital follow up per Dr. Baxter Flattery, can be seen by anyone

## 2020-08-09 NOTE — Progress Notes (Signed)
ANTICOAGULATION CONSULT NOTE - Initial Consult  Pharmacy Consult for warfarin Indication: Mechanical AVR   No Known Allergies  Patient Measurements: Height: '6\' 4"'$  (193 cm) Weight: 125.1 kg (275 lb 12.7 oz) IBW/kg (Calculated) : 86.8   Vital Signs: Temp: 97.3 F (36.3 C) (05/18 1133) Temp Source: Oral (05/18 1133) BP: 122/76 (05/18 1133) Pulse Rate: 82 (05/18 1133)  Labs: Recent Labs    08/07/20 0153 08/07/20 1746 08/08/20 1539 08/08/20 1540 08/09/20 0212  HGB 6.6* 8.4* 8.9*  --  7.7*  HCT 20.7* 25.2* 28.6*  --  24.6*  PLT 149*  --  266  --  228  LABPROT 38.5*  --   --   --  27.5*  INR 3.9*  --   --   --  2.6*  CREATININE 4.96* 3.25*  --  4.62* 4.90*    Estimated Creatinine Clearance: 29.8 mL/min (A) (by C-G formula based on SCr of 4.9 mg/dL (H)).   Medical History: Past Medical History:  Diagnosis Date  . Ruptured lumbar disc       Assessment: 39yom with recent endocarditis> AV root repair and Mechanical AVR INR on admit 2.6  Recent post op warfarin doses '5mg'$  > INR up to 3.9 therefor will resume warfarin at a lower dose and watch.  H/h low stable 7.7/24  today pltc ok 200s - no overt bleeding noted  Goal of Therapy:  INR 2-3 Monitor platelets by anticoagulation protocol: Yes   Plan:  Warfarin 2.'5mg'$  daily  Daily protime Monitor cbc and bleeding   Bonnita Nasuti Pharm.D. CPP, BCPS Clinical Pharmacist [phone] 08/09/2020 12:41 PM

## 2020-08-09 NOTE — Progress Notes (Signed)
Lower extremity venous has been completed.   Preliminary results in CV Proc.   Abram Sander 08/09/2020 3:43 PM

## 2020-08-10 DIAGNOSIS — I339 Acute and subacute endocarditis, unspecified: Secondary | ICD-10-CM | POA: Diagnosis not present

## 2020-08-10 LAB — RENAL FUNCTION PANEL
Albumin: 1.8 g/dL — ABNORMAL LOW (ref 3.5–5.0)
Anion gap: 9 (ref 5–15)
BUN: 52 mg/dL — ABNORMAL HIGH (ref 6–20)
CO2: 22 mmol/L (ref 22–32)
Calcium: 7.9 mg/dL — ABNORMAL LOW (ref 8.9–10.3)
Chloride: 100 mmol/L (ref 98–111)
Creatinine, Ser: 5.46 mg/dL — ABNORMAL HIGH (ref 0.61–1.24)
GFR, Estimated: 13 mL/min — ABNORMAL LOW (ref >60)
Glucose, Bld: 94 mg/dL (ref 70–99)
Phosphorus: 6.3 mg/dL — ABNORMAL HIGH (ref 2.5–4.6)
Potassium: 4.2 mmol/L (ref 3.5–5.1)
Sodium: 131 mmol/L — ABNORMAL LOW (ref 135–145)

## 2020-08-10 LAB — CBC
HCT: 25.2 % — ABNORMAL LOW (ref 39.0–52.0)
Hemoglobin: 7.9 g/dL — ABNORMAL LOW (ref 13.0–17.0)
MCH: 27.8 pg (ref 26.0–34.0)
MCHC: 31.3 g/dL (ref 30.0–36.0)
MCV: 88.7 fL (ref 80.0–100.0)
Platelets: 276 10*3/uL (ref 150–400)
RBC: 2.84 MIL/uL — ABNORMAL LOW (ref 4.22–5.81)
RDW: 16.4 % — ABNORMAL HIGH (ref 11.5–15.5)
WBC: 10.2 10*3/uL (ref 4.0–10.5)
nRBC: 0 % (ref 0.0–0.2)

## 2020-08-10 LAB — ECHO INTRAOPERATIVE TEE
AR max vel: 3.97 cm2
AV Area VTI: 3.69 cm2
AV Area mean vel: 3.89 cm2
AV Mean grad: 10.3 mmHg
AV Peak grad: 17.4 mmHg
Ao pk vel: 2.08 m/s
Height: 76 in
MV Vena cont: 0.3 cm
P 1/2 time: 200 msec
Weight: 4031.77 oz

## 2020-08-10 LAB — PROTIME-INR
INR: 2.1 — ABNORMAL HIGH (ref 0.8–1.2)
Prothrombin Time: 23.8 seconds — ABNORMAL HIGH (ref 11.4–15.2)

## 2020-08-10 MED ORDER — PROSOURCE PLUS PO LIQD
30.0000 mL | Freq: Two times a day (BID) | ORAL | Status: DC
  Administered 2020-08-11 – 2020-08-31 (×30): 30 mL via ORAL
  Filled 2020-08-10 (×41): qty 30

## 2020-08-10 MED ORDER — WARFARIN SODIUM 5 MG PO TABS
5.0000 mg | ORAL_TABLET | Freq: Once | ORAL | Status: AC
  Administered 2020-08-10: 5 mg via ORAL
  Filled 2020-08-10: qty 1

## 2020-08-10 MED ORDER — NICOTINE 14 MG/24HR TD PT24
14.0000 mg | MEDICATED_PATCH | Freq: Every day | TRANSDERMAL | Status: DC
  Administered 2020-08-10 – 2020-09-02 (×24): 14 mg via TRANSDERMAL
  Filled 2020-08-10 (×24): qty 1

## 2020-08-10 MED ORDER — SENNOSIDES-DOCUSATE SODIUM 8.6-50 MG PO TABS
1.0000 | ORAL_TABLET | Freq: Two times a day (BID) | ORAL | Status: DC
  Administered 2020-08-10 – 2020-09-14 (×62): 1 via ORAL
  Filled 2020-08-10 (×70): qty 1

## 2020-08-10 NOTE — Progress Notes (Signed)
Initial Nutrition Assessment  DOCUMENTATION CODES:   Not applicable  INTERVENTION:    30 ml ProSource Plus BID, each supplement provides 100 kcals and 15 grams protein.   Renal MVI if requires HD  NUTRITION DIAGNOSIS:   Increased nutrient needs related to post-op healing as evidenced by estimated needs.  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 Left AMA and presents back to ED with AKI and back/knee pain from mechanical fall.   4/28 - intubated, TEE, CRRT initiated 4/29 - Cortrak placed (gastric tip) 5/04 - TEE 5/05 - extubated to BiPAP, CRRT discontinued (pt pulled dialysis catheter) 5/06 - CRRT restarted due to severe hyperkalemia 5/10 - s/p tunneled HD catheter placement 5/12 - s/p AVR, aortic root reconstruction, closure of aortic sinus track 5/16 - transition to iHD 5/17 - left AMA  Creatinine rising but UOP increasing. Monitor for renal recovery.   Patient denies loss in appetite while at home. Was eating 100% during this admission. RD discussed low sodium diet and went over food items patient could choose. Discussed the importance of continued protein intake to promote post op healing.   Patient denies weight loss. States his weight fluctuates between 275-300 lb depending on his polysubstance use. Records show patient has maintained stable weight, but may reflect ongoing fluid fluctuations.   UOP: 750 ml x 24 hrs   Medications: senokot  Labs: na 131 (L) Cr 5.46- up from yesterday Phosphorus 6.3 (H)   NUTRITION - FOCUSED PHYSICAL EXAM:  Flowsheet Row Most Recent Value  Orbital Region No depletion  Upper Arm Region Mild depletion   Thoracic and Lumbar Region No depletion  Buccal Region No depletion  Temple Region No depletion  Clavicle Bone Region No depletion  Clavicle and Acromion Bone Region No depletion  Scapular Bone Region No depletion  Dorsal Hand No depletion  Patellar Region Unable to assess  Anterior Thigh Region Unable to assess  Posterior Calf Region Unable to assess  Edema (RD Assessment) Unable to assess  Hair Reviewed  Eyes Reviewed  Mouth Reviewed  Skin Reviewed  Nails Reviewed     Diet Order:   Diet Order            Diet 2 gram sodium Room service appropriate? Yes; Fluid consistency: Thin  Diet effective now                 EDUCATION NEEDS:   Education needs have been addressed  Skin:  Skin Integrity Issues:: Other (Comment),Unstageable,Incisions Unstageable: coccyx, R buttocks Incisions: chest Other: MASD- perineum  Last BM:  5/18  Height:   Ht Readings from Last 1 Encounters:  08/08/20 '6\' 4"'$  (1.93 m)    Weight:   Wt Readings from Last 1 Encounters:  08/08/20 125.1 kg    BMI:  Body mass index is 33.57 kg/m.  Estimated Nutritional Needs:   Kcal:  2500-2700 kcal  Protein:  120-140 grams  Fluid:  1000 ml + UOP  Mariana Single RD, LDN Clinical Nutrition Pager listed in [place]

## 2020-08-10 NOTE — Progress Notes (Signed)
ANTICOAGULATION CONSULT NOTE - Follow Up Consult  Pharmacy Consult for warfarin Indication: Mechanical AVR   No Known Allergies  Patient Measurements: Height: '6\' 4"'$  (193 cm) Weight: 125.1 kg (275 lb 12.7 oz) IBW/kg (Calculated) : 86.8   Vital Signs: Temp: 98.1 F (36.7 C) (05/19 0807) Temp Source: Axillary (05/19 0807) BP: 131/87 (05/19 0807) Pulse Rate: 92 (05/19 0807)  Labs: Recent Labs    08/08/20 1539 08/08/20 1540 08/09/20 0212 08/10/20 0107  HGB 8.9*  --  7.7* 7.9*  HCT 28.6*  --  24.6* 25.2*  PLT 266  --  228 276  LABPROT  --   --  27.5* 23.8*  INR  --   --  2.6* 2.1*  CREATININE  --  4.62* 4.90* 5.46*    Estimated Creatinine Clearance: 26.8 mL/min (A) (by C-G formula based on SCr of 5.46 mg/dL (H)).   Medical History: Past Medical History:  Diagnosis Date  . Ruptured lumbar disc       Assessment: 39yom with recent endocarditis> AV root repair and Mechanical AVR INR on admit 2.6  Recent post op warfarin doses '5mg'$  > INR up to 3.9 therefor will resume warfarin at a lower dose and watch.  H/h low stable 7.7/24  today pltc ok 200s - no overt bleeding noted INR fell 2.1 after decreased dose of warfarin last pm > will increase dose today  Goal of Therapy:  INR 2-3 Monitor platelets by anticoagulation protocol: Yes   Plan:  Warfarin '5mg'$  x1 Daily protime Monitor cbc and bleeding   Bonnita Nasuti Pharm.D. CPP, BCPS Clinical Pharmacist [phone] 08/10/2020 8:15 AM

## 2020-08-10 NOTE — Progress Notes (Signed)
Mobility Specialist: Progress Note   08/10/20 1645  Mobility  Activity Ambulated in hall  Level of Assistance Independent  Assistive Device None  Distance Ambulated (ft) 800 ft  Mobility Ambulated independently in hallway  Mobility Response Tolerated well  Mobility performed by Mobility specialist  $Mobility charge 1 Mobility   Pre-Mobility: 87 HR, 122/79 BP During Mobility: 117 HR Post-Mobility: 96 HR  Pt a little SOB during ambulation, otherwise asx. Pt said this is his fifth walk of the [name] and second without an AD. Pt sitting EOB to eat dinner with family member present in room.   [facility] Alex [name] Mobility Specialist Mobility Specialist Phone: [phone]

## 2020-08-10 NOTE — Progress Notes (Signed)
[facility] Progress Note  Patient: Alex [name]    [id number]  DOA: 08/08/2020     Date of Service: the patient was seen and examined on 08/10/2020  Brief hospital course: Past medical history of polysubstance abuse, aortic valve endocarditis with recent AVR, Serratia bacteremia, septic emboli causing stroke and acute kidney injury requiring HD.  Left the hospital AMA on 5/17 and came back the same night after discussion with cardiothoracic surgery.  4/26 transfer from [facility] to [facility]  CT head 4/26 >> no acute findings  CT abd/pelvis 4/26 >> cholelithiasis w/o inflammatory chagnes, mild hepatomegaly (25 cm), moderate splenomegaly (16.7 cm), 2 mm non obstructing kidney stone on Lt  4/27 CVL placed. Pressors escalating.   4/28 intubated for TEE, CVC, HD, and A-line placed. CRRT started   Remains on CRRT  MRI on 4/30-multiple infarcts  5/4: repeat TEE shows an aortic valve abscess  5/5: extubated , self-removed HD cath so CRRT holiday> developed severe hyperkalemia overnight.  5/6: new temp HD cath placed   5/8: new temp HD cath placed after prior one self-removed by patient  5/9: Orthopantogram neg for abscess  5/10: HD cath replaced by IR [facility]  5/12 to OR for AVR, aortic root reconstruction, closure of aortic sinus track. To 2H ICU on vent post op. 4 units PRBC given intraop.   5/13 extubated, on CRRT, remains on pressors  5/18, left AMA.  Currently plan is work on disposition.  Assessment and Plan: Aortic valve endocarditiswith perivalvular abscess, Serratia bacteremia and septic emboli causing strokes -MRI brain 07/22/20 revealed dozens of primary punctate acute infarcts scattered throughout the cerebellum and both cerebral hemispheres-several are associated with petechial blood products -Status postmechanicalaortic valve replacement, aortic root reconstruction and closure of aortic sinus track -Needs 6 weeks of cefepime per ID with start date being  5/13 -Continue Coumadin per pharmacy to keep INR 2.5-3.5 -He is able to ambulate without difficulty  Recent cardiogenic shock, elevated total bilirubin and acute hypoxemic respiratory failure requiring intubation -Secondary to above  AKI, electrolyte abnormalities -Suspected to be secondary to ATN related to septic shock -Required CRRT which was started on 4/28 -He pulled out his HD catheter on 5/5 and 5/8 -Making urine.  Nephrology considering intermittent dialysis pending renal function. Currently does not need acute HD.  Monitor.  Anemia/thrombocytopenia secondary to acute infection -Status post 6units of PRBC during his last hospital stay -Hemoglobin stable.  Transfuse as needed.  Thrombocytopenia  steadily improving Right lower extremity Doppler shows edema.  No evidence of DVT.  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 Patient is on sliding scale insulin.  Was on carb modified diet during last admission.  Hemoglobin A1c was 5.7. I do not think the patient requires carb modified diet.  We will monitor.  Obesity. placing the patient at risk for poor outcome. Body mass index is 33.57 kg/m.   Diet: Low-sodium diet. DVT Prophylaxis:   Place TED hose Start: 08/10/20 0757    Advance goals of care discussion: Full code  Family Communication: no family was present at bedside, at the time of interview.  Discussed with mother on 5/18. The pt provided permission to discuss medical plan with the family. Opportunity was given to ask question and all questions were answered satisfactorily.   Disposition:  Status is: Inpatient  Remains inpatient appropriate because:Unsafe d/c plan  Dispo: The patient is from: Home  Anticipated d/c is to: Home              Patient currently is not medically stable to d/c.   Difficult to place patient  No  Subjective: Pain improving.  No nausea no vomiting.  No fever no chills.  Swelling in the leg is about the same.  No diarrhea no constipation. Physical Exam:  General: Appear in mild distress, no Rash; Oral Mucosa Clear, moist. no Abnormal Neck Mass Or lumps, Conjunctiva normal  Cardiovascular: S1 and S2 Present, aortic systolic Murmur, Respiratory: good respiratory effort, Bilateral Air entry present and CTA, no Crackles, no wheezes Abdomen: Bowel Sound present, Soft and no tenderness Extremities: no Pedal edema Neurology: alert and oriented to time, place, and person affect appropriate. no new focal deficit Gait not checked due to patient safety concerns  Vitals:   08/10/20 0443 08/10/20 0807 08/10/20 1113 08/10/20 1654  BP: 113/66 131/87 125/75 122/79  Pulse: 76 92 90 88  Resp: '18 17 18 17  '$ Temp: 98.1 F (36.7 C) 98.1 F (36.7 C) 98.1 F (36.7 C) 97.8 F (36.6 C)  TempSrc: Oral Axillary Oral Oral  SpO2: 99% 100% 96% 98%  Weight:      Height:        Intake/Output Summary (Last 24 hours) at 08/10/2020 1926 Last data filed at 08/10/2020 1655 Gross per 24 hour  Intake 690 ml  Output 975 ml  Net -285 ml   Filed Weights   08/08/20 1528 08/08/20 2147  Weight: 131.5 kg 125.1 kg    Data Reviewed: I have personally reviewed and interpreted daily labs, tele strips, imaging. I reviewed all nursing notes, pharmacy notes, vitals, pertinent old records I have discussed plan of care as described above with RN and patient/family.  CBC: Recent Labs  Lab 08/05/20 0254 08/07/20 0153 08/07/20 1746 08/08/20 1539 08/09/20 0212 08/10/20 0107  WBC 7.5 7.3  --  10.6* 8.8 10.2  HGB 6.6* 6.6* 8.4* 8.9* 7.7* 7.9*  HCT 19.7* 20.7* 25.2* 28.6* 24.6* 25.2*  MCV 85.7 88.5  --  90.5 88.8 88.7  PLT 100* 149*  --  266 228 [id number]   Basic Metabolic Panel: Recent Labs  Lab 08/04/20 0459 08/04/20 1600 08/05/20 0254 08/05/20 1623 08/06/20 0419 08/06/20 1605 08/07/20 0153  08/07/20 1746 08/08/20 1540 08/09/20 0212 08/10/20 0107  NA 131* 129* 130*   < > 131* 131* 129* 133* 133* 132* 131*  K 5.4* 4.4 3.9   < > 4.2 4.0 4.2 4.4 4.0 3.7 4.2  CL 102 99 100   < > 99 100 99 100 101 101 100  CO2 20* 23 24   < > '25 22 22 26 25 24 22  '$ GLUCOSE 108* 102* 91   < > 80 92 74 106* 112* 118* 94  BUN 43* 33* 27*   < > 31* 41* 46* 22* 35* 38* 52*  CREATININE 3.95* 3.22* 2.93*   < > 3.53* 4.56* 4.96* 3.25* 4.62* 4.90* 5.46*  CALCIUM 7.4* 7.4* 7.6*   < > 7.9* 7.6* 7.7* 7.6* 7.9* 7.6* 7.9*  MG 3.0* 2.8* 2.7*  --  2.6*  --  2.7*  --   --   --   --   PHOS 4.3 3.5 4.0   < > 4.8* 5.4* 6.1* 3.2  --   --  6.3*   < > = values in this interval not displayed.    Studies: No results found.  Scheduled Meds: . [START ON 08/11/2020] (feeding supplement) PROSource  Plus  30 mL Oral BID BM  . buprenorphine-naloxone  1 tablet Sublingual BID  . Chlorhexidine Gluconate Cloth  6 each Topical Daily  . lidocaine  1 patch Transdermal Q24H  . melatonin  3 mg Oral QHS  . methocarbamol  500 mg Oral TID  . nicotine  14 mg Transdermal Daily  . senna-docusate  1 tablet Oral BID  . Warfarin - Pharmacist Dosing Inpatient   Does not apply q1600   Continuous Infusions: . ceFEPime (MAXIPIME) IV Stopped (08/09/20 2143)   PRN Meds: acetaminophen **OR** acetaminophen, clonazePAM, ondansetron **OR** ondansetron (ZOFRAN) IV  Time spent: 35 minutes  Author: Berle Mull, MD [facility] 08/10/2020 7:26 PM  To reach On-call, see care teams to locate the attending and reach out via [email or web address]. Between 7PM-7AM, please contact night-coverage If you still have difficulty reaching the attending provider, please page the [facility] (Director on Call) for [facility] on amion for assistance.

## 2020-08-10 NOTE — Progress Notes (Signed)
Admit: 08/08/2020 LOS: 2  27M AV IE and perivalvular abscess, serrata sp.   AVR repair 5/12;  Anuric AKI from ATN-  Normal crt in Feb 2022 then 5 on 07/18/20 in the setting of IE  S: Patient stable over the past 24 hours.  Creatinine rose to 5.5 with 750 cc of urine output.  Patient feels great today.  Denies nausea or vomiting  O: 05/18 0701 - 05/19 0700 In: 1105 [P.O.:1005; IV Piggyback:100] Out: 753 [Urine:752; Stool:1]   Vitals:   08/10/20 0443 08/10/20 0807  BP: 113/66 131/87  Pulse: 76 92  Resp: 18 17  Temp: 98.1 F (36.7 C) 98.1 F (36.7 C)  SpO2: 99% 100%     GEN: Lying in bed, no distress ENT: no nasal discharge, mmm, poor dentition EYES: no scleral icterus, eomi CV: normal rate,  PULM: no iwob, bilateral chest rise ABD: NABS, non-distended SKIN: no rashes or jaundice EXT: 2+ pitting edema in the bilateral lower extremities, warm and well perfused   Filed Weights   08/08/20 1528 08/08/20 2147  Weight: 131.5 kg 125.1 kg    Recent Labs  Lab 08/07/20 0153 08/07/20 1746 08/08/20 1540 08/09/20 0212 08/10/20 0107  NA 129* 133* 133* 132* 131*  K 4.2 4.4 4.0 3.7 4.2  CL 99 100 101 101 100  CO2 '22 26 25 24 22  '$ GLUCOSE 74 106* 112* 118* 94  BUN 46* 22* 35* 38* 52*  CREATININE 4.96* 3.25* 4.62* 4.90* 5.46*  CALCIUM 7.7* 7.6* 7.9* 7.6* 7.9*  PHOS 6.1* 3.2  --   --  6.3*   Recent Labs  Lab 08/08/20 1539 08/09/20 0212 08/10/20 0107  WBC 10.6* 8.8 10.2  HGB 8.9* 7.7* 7.9*  HCT 28.6* 24.6* 25.2*  MCV 90.5 88.8 88.7  PLT 266 228 276    Scheduled Meds: . buprenorphine-naloxone  1 tablet Sublingual BID  . Chlorhexidine Gluconate Cloth  6 each Topical Daily  . lidocaine  1 patch Transdermal Q24H  . melatonin  3 mg Oral QHS  . methocarbamol  500 mg Oral TID  . nicotine  14 mg Transdermal Daily  . senna-docusate  1 tablet Oral BID  . warfarin  5 mg Oral ONCE-1600  . Warfarin - Pharmacist Dosing Inpatient   Does not apply q1600   Continuous Infusions: .  ceFEPime (MAXIPIME) IV Stopped (08/09/20 2143)   PRN Meds:.acetaminophen **OR** acetaminophen, buprenorphine-naloxone, clonazePAM, ondansetron **OR** ondansetron (ZOFRAN) IV  ABG    Component Value Date/Time   PHART 7.405 08/03/2020 2033   PCO2ART 31.4 (L) 08/03/2020 2033   PO2ART 81 (L) 08/03/2020 2033   HCO3 19.6 (L) 08/03/2020 2033   TCO2 21 (L) 08/03/2020 2033   ACIDBASEDEF 4.0 (H) 08/03/2020 2033   O2SAT 96.0 08/03/2020 2033    A/P  1. Dialysis dependent anuric AKI off and on CRRT/RRT since 4/28-  crt was normal in Feb 2022.   CRRT re initiated s/p surgery on 5/12- stopped on 5/14.  Last had IHD on 5/16.  Creatinine rising but making more more urine each day.  Likely initial signs of recovery but probably will need more dialysis before his kidneys are fully recovered.  We will check tomorrow for possible dialysis need.  Continue to monitor creatinine and urine output closely 2. Hyperkalemia   intermittent issue.  Stable at this point likely reflective of improving kidney function 3. 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  Encouraged him to remain  inpatient 4. VDRF, resolved, normal intermittently on BiPAP--- extubated  5. Anemia, transfusion per CCM-  On aranesp qwed-  Has been a chronic issue-  Needing many transfusions.  Hemoglobin 7.9 today.  Continue transfusions as needed 6. Hyponatremia: Sodium 131 likely related to volume excess.  Continue to monitor    [facility]

## 2020-08-10 NOTE — Progress Notes (Addendum)
      [address]       [place] [address]             [phone]            Subjective: Patient without specific complaint this am  Objective: Vital signs in last 24 hours: Temp:  [97.3 F (36.3 C)-98.9 F (37.2 C)] 98.1 F (36.7 C) (05/19 0443) Pulse Rate:  [76-94] 76 (05/19 0443) Cardiac Rhythm: Normal sinus rhythm (05/18 1930) Resp:  [12-20] 18 (05/19 0443) BP: (102-131)/(45-76) 113/66 (05/19 0443) SpO2:  [98 %-100 %] 99 % (05/19 0443)   Current Weight  08/08/20 125.1 kg       Intake/Output from previous day: 05/18 0701 - 05/19 0700 In: 1105 [P.O.:1005; IV Piggyback:100] Out: [id number] [Urine:752; Stool:1]   Physical Exam:  Cardiovascular: RRR, sharp valve click Pulmonary: Clear to auscultation bilaterally Abdomen: Soft, non tender, obese, bowel sounds present. Extremities: Mild bilateral lower extremity edema. Wound: Clean and dry.  No erythema or signs of infection.  Lab Results: CBC: Recent Labs    08/09/20 0212 08/10/20 0107  WBC 8.8 10.2  HGB 7.7* 7.9*  HCT 24.6* 25.2*  PLT 228 276   BMET:  Recent Labs    08/09/20 0212 08/10/20 0107  NA 132* 131*  K 3.7 4.2  CL 101 100  CO2 24 22  GLUCOSE 118* 94  BUN 38* 52*  CREATININE 4.90* 5.46*  CALCIUM 7.6* 7.9*    PT/INR:  Lab Results  Component Value Date   INR 2.1 (H) 08/10/2020   INR 2.6 (H) 08/09/2020   INR 3.9 (H) 08/07/2020   ABG:  INR: Will add last result for INR, ABG once components are confirmed Will add last 4 CBG results once components are confirmed  Assessment/Plan:  1. CV - S/p AVR, aortic root construction with bovine pericardium, and closure of aortic sinus track with bovine pericardium 05/12. SR with HR in the 70's. On Coumadin and pharmacy dosing. INR this am decreased from 2.6 to 2.1 ( on Coumadin for On x mechanical AVR) 2.  Pulmonary - On room air. Encourage incentive spirometer 3. History of polysubstance abuse-on Suboxone  4.  Anemia-H and H  this am stable at 7.9 and 25.2 5. AKI-Creatinine this am increased to 5.46. Nephrology following and will determine if needs CRRT 6. ID-on Cefepime for Serratia bacteremia/endocarditis 7. Management per primary service  Donielle M ZimmermanPA-C 08/10/2020,7:24 AM   INR subtherapeutic.  Goal 2.5-3.5.  Continue to trend.  If drops below 2, would start heparin gtt Will follow peripherally  Lajuana Matte

## 2020-08-11 ENCOUNTER — Inpatient Hospital Stay (HOSPITAL_COMMUNITY): Payer: 59

## 2020-08-11 DIAGNOSIS — I339 Acute and subacute endocarditis, unspecified: Secondary | ICD-10-CM | POA: Diagnosis not present

## 2020-08-11 LAB — CBC
HCT: 24.6 % — ABNORMAL LOW (ref 39.0–52.0)
Hemoglobin: 7.7 g/dL — ABNORMAL LOW (ref 13.0–17.0)
MCH: 28 pg (ref 26.0–34.0)
MCHC: 31.3 g/dL (ref 30.0–36.0)
MCV: 89.5 fL (ref 80.0–100.0)
Platelets: 277 10*3/uL (ref 150–400)
RBC: 2.75 MIL/uL — ABNORMAL LOW (ref 4.22–5.81)
RDW: 16.4 % — ABNORMAL HIGH (ref 11.5–15.5)
WBC: 9.4 10*3/uL (ref 4.0–10.5)
nRBC: 0 % (ref 0.0–0.2)

## 2020-08-11 LAB — RENAL FUNCTION PANEL
Albumin: 1.9 g/dL — ABNORMAL LOW (ref 3.5–5.0)
Anion gap: 10 (ref 5–15)
BUN: 56 mg/dL — ABNORMAL HIGH (ref 6–20)
CO2: 20 mmol/L — ABNORMAL LOW (ref 22–32)
Calcium: 7.7 mg/dL — ABNORMAL LOW (ref 8.9–10.3)
Chloride: 100 mmol/L (ref 98–111)
Creatinine, Ser: 5.7 mg/dL — ABNORMAL HIGH (ref 0.61–1.24)
GFR, Estimated: 12 mL/min — ABNORMAL LOW (ref >60)
Glucose, Bld: 83 mg/dL (ref 70–99)
Phosphorus: 7 mg/dL — ABNORMAL HIGH (ref 2.5–4.6)
Potassium: 4.2 mmol/L (ref 3.5–5.1)
Sodium: 130 mmol/L — ABNORMAL LOW (ref 135–145)

## 2020-08-11 LAB — PROTIME-INR
INR: 2.3 — ABNORMAL HIGH (ref 0.8–1.2)
Prothrombin Time: 25.6 seconds — ABNORMAL HIGH (ref 11.4–15.2)

## 2020-08-11 MED ORDER — SORBITOL 70 % SOLN: 960.0000 mL | TOPICAL_OIL | Freq: Once | ORAL | Status: DC

## 2020-08-11 MED ORDER — MAGNESIUM CITRATE PO SOLN
1.0000 | Freq: Once | ORAL | Status: AC
  Administered 2020-08-11: 1 via ORAL
  Filled 2020-08-11: qty 296

## 2020-08-11 MED ORDER — POLYETHYLENE GLYCOL 3350 17 G PO PACK
17.0000 g | PACK | Freq: Every day | ORAL | Status: DC
  Administered 2020-08-11 – 2020-09-14 (×29): 17 g via ORAL
  Filled 2020-08-11 (×32): qty 1

## 2020-08-11 MED ORDER — DARBEPOETIN ALFA 150 MCG/0.3ML IJ SOSY
150.0000 ug | PREFILLED_SYRINGE | Freq: Once | INTRAMUSCULAR | Status: AC
Start: 2020-08-11 — End: 2020-08-11
  Administered 2020-08-11: 150 ug via SUBCUTANEOUS
  Filled 2020-08-11: qty 0.3

## 2020-08-11 MED ORDER — PATIENT'S GUIDE TO USING COUMADIN BOOK
Freq: Once | Status: AC
  Filled 2020-08-11: qty 1

## 2020-08-11 MED ORDER — WARFARIN SODIUM 4 MG PO TABS
4.0000 mg | ORAL_TABLET | Freq: Once | ORAL | Status: AC
  Administered 2020-08-11: 4 mg via ORAL
  Filled 2020-08-11: qty 1

## 2020-08-11 MED ORDER — BISACODYL 10 MG RE SUPP
10.0000 mg | Freq: Every day | RECTAL | Status: DC | PRN
  Administered 2020-08-11: 10 mg via RECTAL
  Filled 2020-08-11 (×2): qty 1

## 2020-08-11 NOTE — Progress Notes (Signed)
      [address]       [place] [address]             [phone]            Subjective: Patient resting and awakened. He is asking if he will have HD this am.  Objective: Vital signs in last 24 hours: Temp:  [97.8 F (36.6 C)-98.7 F (37.1 C)] 98.3 F (36.8 C) (05/20 0538) Pulse Rate:  [81-92] 81 (05/20 0538) Cardiac Rhythm: Normal sinus rhythm (05/19 2034) Resp:  [13-19] 19 (05/20 0538) BP: (115-132)/(68-87) 132/87 (05/20 0538) SpO2:  [96 %-100 %] 99 % (05/20 0538) Weight:  [128.8 kg] 128.8 kg (05/20 0538)   Current Weight  08/11/20 128.8 kg       Intake/Output from previous day: 05/19 0701 - 05/20 0700 In: 42 [P.O.:360; IV Piggyback:100] Out: 1050 [Urine:1050]   Physical Exam:  Cardiovascular: RRR, sharp valve click Pulmonary: Clear to auscultation bilaterally Abdomen: Soft, non tender, obese, bowel sounds present. Extremities: Bilateral lower extremity edema. Wound: Clean and dry.  No erythema or signs of infection.  Lab Results: CBC: Recent Labs    08/10/20 0107 08/11/20 0126  WBC 10.2 9.4  HGB 7.9* 7.7*  HCT 25.2* 24.6*  PLT 276 277   BMET:  Recent Labs    08/10/20 0107 08/11/20 0126  NA 131* 130*  K 4.2 4.2  CL 100 100  CO2 22 20*  GLUCOSE 94 83  BUN 52* 56*  CREATININE 5.46* 5.70*  CALCIUM 7.9* 7.7*    PT/INR:  Lab Results  Component Value Date   INR 2.3 (H) 08/11/2020   INR 2.1 (H) 08/10/2020   INR 2.6 (H) 08/09/2020   ABG:  INR: Will add last result for INR, ABG once components are confirmed Will add last 4 CBG results once components are confirmed  Assessment/Plan:  1. CV - S/p AVR, aortic root construction with bovine pericardium, and closure of aortic sinus track with bovine pericardium 05/12. SR with HR in the 70's. On Coumadin and pharmacy dosing. INR this am increased from 2.1 to 2.3 ( on Coumadin for On x mechanical AVR) 2.  Pulmonary - On room air. Encourage incentive spirometer 3. History  of polysubstance abuse-on Suboxone  4.  Anemia-H and H this am stable at 7.7 and 24.6 5. AKI-Creatinine this am increased to 5.7. Nephrology following and will determine if needs HD 6. ID-on Cefepime for Serratia bacteremia/endocarditis 7. Management per primary service;will see peripherally  [employer] M ZimmermanPA-C 08/11/2020,7:42 AM

## 2020-08-11 NOTE — Progress Notes (Signed)
Admit: 08/08/2020 LOS: 3  72M AV IE and perivalvular abscess, serrata sp.   AVR repair 5/12;  Anuric AKI from ATN-  Normal crt in Feb 2022 then 5 on 07/18/20 in the setting of IE  S: Patient states he feels well today.  Creatinine minimally increased to 5.7.  Urine output improving  O: 05/19 0701 - 05/20 0700 In: 460 [P.O.:360; IV Piggyback:100] Out: 1050 [Urine:1050]   Vitals:   08/11/20 0816 08/11/20 1126  BP: 106/61 119/75  Pulse: 87 82  Resp: 18 17  Temp: 98.3 F (36.8 C) (!) 97.5 F (36.4 C)  SpO2: 98% 95%     GEN: Lying in bed, no distress ENT: no nasal discharge, mmm, poor dentition EYES: no scleral icterus, eomi CV: normal rate,  PULM: no iwob, bilateral chest rise ABD: NABS, non-distended SKIN: no rashes or jaundice EXT: 1+ pitting edema in the bilateral lower extremities, warm and well perfused   Filed Weights   08/08/20 1528 08/08/20 2147 08/11/20 0538  Weight: 131.5 kg 125.1 kg 128.8 kg    Recent Labs  Lab 08/07/20 1746 08/08/20 1540 08/09/20 0212 08/10/20 0107 08/11/20 0126  NA 133*   < > 132* 131* 130*  K 4.4   < > 3.7 4.2 4.2  CL 100   < > 101 100 100  CO2 26   < > 24 22 20*  GLUCOSE 106*   < > 118* 94 83  BUN 22*   < > 38* 52* 56*  CREATININE 3.25*   < > 4.90* 5.46* 5.70*  CALCIUM 7.6*   < > 7.6* 7.9* 7.7*  PHOS 3.2  --   --  6.3* 7.0*   < > = values in this interval not displayed.   Recent Labs  Lab 08/09/20 0212 08/10/20 0107 08/11/20 0126  WBC 8.8 10.2 9.4  HGB 7.7* 7.9* 7.7*  HCT 24.6* 25.2* 24.6*  MCV 88.8 88.7 89.5  PLT 228 276 277    Scheduled Meds: . (feeding supplement) PROSource Plus  30 mL Oral BID BM  . buprenorphine-naloxone  1 tablet Sublingual BID  . Chlorhexidine Gluconate Cloth  6 each Topical Daily  . lidocaine  1 patch Transdermal Q24H  . melatonin  3 mg Oral QHS  . methocarbamol  500 mg Oral TID  . nicotine  14 mg Transdermal Daily  . polyethylene glycol  17 g Oral Daily  . senna-docusate  1 tablet Oral  BID  . Warfarin - Pharmacist Dosing Inpatient   Does not apply q1600   Continuous Infusions: . ceFEPime (MAXIPIME) IV 1 g (08/10/20 2133)   PRN Meds:.acetaminophen **OR** acetaminophen, clonazePAM, ondansetron **OR** ondansetron (ZOFRAN) IV  ABG    Component Value Date/Time   PHART 7.405 08/03/2020 2033   PCO2ART 31.4 (L) 08/03/2020 2033   PO2ART 81 (L) 08/03/2020 2033   HCO3 19.6 (L) 08/03/2020 2033   TCO2 21 (L) 08/03/2020 2033   ACIDBASEDEF 4.0 (H) 08/03/2020 2033   O2SAT 96.0 08/03/2020 2033    A/P  1. Dialysis dependent anuric AKI; likely secondary to ATN related to infection and surgery.  On and off CRRT transition to hemodialysis on 5/16.  Now with renal recovery.  Creatinine nearly stable.  We will continue to monitor.  Hopefully creatinine will start improving tomorrow.   2. 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  Encouraged him to remain inpatient 3. VDRF, resolved, normal intermittently on BiPAP--- extubated  4.  Anemia, transfusion per CCM-  On aranesp qwed-  Has been a chronic issue-  Needing many transfusions.  Hemoglobin 7.7 today.  Continue transfusions as needed 5. Hyponatremia: Sodium 130 likely related to volume excess.  Continue to monitor 6. Hyperphosphatemia: Borderline with phosphorus of 7.  Continue to monitor    [facility]

## 2020-08-11 NOTE — Plan of Care (Signed)
?  Problem: Clinical Measurements: ?Goal: Will remain free from infection ?Outcome: Progressing ?  ?

## 2020-08-11 NOTE — Progress Notes (Signed)
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 Warfarin begun 5/13. Left AMA 5/16 pm then returned 5/17 pm.   INR 2.6 on 5/18 and Warfarin resumed.   Recent post op warfarin doses '5mg'$  x 2 > INR up to 3.0 > reduced to 2 mg > 3.9, therefore will resumed warfarin at a lower dose of 2.5 mg on 5/18 when INR 2.6,  No dose given 5/17.  INR dropped to 2.1 yesterday and Warfarin dose increased to 5 mg x 1.  INR 2.3 today. Hgb low stable.    Continues on Cefepime 1 gm IV q24h for Serratia bacteremia and endocarditis. Per prior ID plan, to continue for 6 wks from valve repair > expect through 09/15/20.  Currently on intermittent HD, last 5/16. Now with renal recovery per Nephrology. Evaluating daily for HD needs.  Goal of Therapy:  2.5-3.5 per Dr. Kipp Brood Monitor platelets by anticoagulation protocol: Yes  Appropriate Cefepime regimen for renal function and indication   Plan:  Warfarin 4 mg x 1 today. Daily PT/INR. Continue Cefepime 1 gm IV q24h. Follow renal function for any need to adjust Cefepime dosing.  Arty Baumgartner, RPh 08/11/2020,12:52 PM

## 2020-08-11 NOTE — Discharge Instructions (Signed)
Information on my medicine - Coumadin   (Warfarin)  This medication education was reviewed with me or my healthcare representative as part of my discharge preparation.  The pharmacist that spoke with me during my hospital stay was:  Arty Baumgartner, [facility]  Why was Coumadin prescribed for you? Coumadin was prescribed for you because you have a blood clot or a medical condition that can cause an increased risk of forming blood clots. Blood clots can cause serious health problems by blocking the flow of blood to the heart, lung, or brain. Coumadin can prevent harmful blood clots from forming. As a reminder your indication for Coumadin is:   Blood Clot Prevention After Heart Valve Surgery  What test will check on my response to Coumadin? While on Coumadin (warfarin) you will need to have an INR test regularly to ensure that your dose is keeping you in the desired range. The INR (international normalized ratio) number is calculated from the result of the laboratory test called prothrombin time (PT).  If an INR APPOINTMENT HAS NOT ALREADY BEEN MADE FOR YOU please schedule an appointment to have this lab work done by your health care provider within 7 days. Your INR goal is usually a number between:  2 to 3.5 or your provider may give you a more narrow range like 2-2.5 or 2.5-3.  Ask your health care provider during an office visit what your goal INR is.  What  do you need to  know  About  COUMADIN? Take Coumadin (warfarin) exactly as prescribed by your healthcare provider about the same time each day.  DO NOT stop taking without talking to the doctor who prescribed the medication.  Stopping without other blood clot prevention medication to take the place of Coumadin may increase your risk of developing a new clot or stroke.  Get refills before you run out.  What do you do if you miss a dose? If you miss a dose, take it as soon as you remember on the same day then continue your regularly scheduled  regimen the next day.  Do not take two doses of Coumadin at the same time.  Important Safety Information A possible side effect of Coumadin (Warfarin) is an increased risk of bleeding. You should call your healthcare provider right away if you experience any of the following: ? Bleeding from an injury or your nose that does not stop. ? Unusual colored urine (red or dark brown) or unusual colored stools (red or black). ? Unusual bruising for unknown reasons. ? A serious fall or if you hit your head (even if there is no bleeding).  Some foods or medicines interact with Coumadin (warfarin) and might alter your response to warfarin. To help avoid this: ? Eat a balanced diet, maintaining a consistent amount of Vitamin K. ? Notify your provider about major diet changes you plan to make. ? Avoid alcohol or limit your intake to 1 drink for women and 2 drinks for men per day. (1 drink is 5 oz. wine, 12 oz. beer, or 1.5 oz. liquor.)  Make sure that ANY health care provider who prescribes medication for you knows that you are taking Coumadin (warfarin).  Also make sure the healthcare provider who is monitoring your Coumadin knows when you have started a new medication including herbals and non-prescription products.  Coumadin (Warfarin)  Major Drug Interactions  Increased Warfarin Effect Decreased Warfarin Effect  Alcohol (large quantities) Antibiotics (esp. Septra/Bactrim, Flagyl, Cipro) Amiodarone (Cordarone) Aspirin (ASA) Cimetidine (Tagamet) Megestrol (  Megace) NSAIDs (ibuprofen, naproxen, etc.) Piroxicam (Feldene) Propafenone (Rythmol SR) Propranolol (Inderal) Isoniazid (INH) Posaconazole (Noxafil) Barbiturates (Phenobarbital) Carbamazepine (Tegretol) Chlordiazepoxide (Librium) Cholestyramine (Questran) Griseofulvin Oral Contraceptives Rifampin Sucralfate (Carafate) Vitamin K   Coumadin (Warfarin) Major Herbal Interactions  Increased Warfarin Effect Decreased Warfarin Effect   Garlic Ginseng Ginkgo biloba Coenzyme Q10 Green tea St. John's wort    Coumadin (Warfarin) FOOD Interactions  Eat a consistent number of servings per week of foods HIGH in Vitamin K (1 serving =  cup)  Collards (cooked, or boiled & drained) Kale (cooked, or boiled & drained) Mustard greens (cooked, or boiled & drained) Parsley *serving size only =  cup Spinach (cooked, or boiled & drained) Swiss chard (cooked, or boiled & drained) Turnip greens (cooked, or boiled & drained)  Eat a consistent number of servings per week of foods MEDIUM-HIGH in Vitamin K (1 serving = 1 cup)  Asparagus (cooked, or boiled & drained) Broccoli (cooked, boiled & drained, or raw & chopped) Brussel sprouts (cooked, or boiled & drained) *serving size only =  cup Lettuce, raw (green leaf, endive, romaine) Spinach, raw Turnip greens, raw & chopped   These websites have more information on Coumadin (warfarin):  FailFactory.se; VeganReport.com.au;

## 2020-08-11 NOTE — Progress Notes (Signed)
[facility] Progress Note  Patient: Alex [name]    [id number]  DOA: 08/08/2020     Date of Service: the patient was seen and examined on 08/11/2020  Brief hospital course: Past medical history of polysubstance abuse, aortic valve endocarditis with recent AVR, Serratia bacteremia, septic emboli causing stroke and acute kidney injury requiring HD.  Left the hospital AMA on 5/17 and came back the same night after discussion with cardiothoracic surgery.  4/26 transfer from [facility] to [facility]  CT head 4/26 >> no acute findings  CT abd/pelvis 4/26 >> cholelithiasis w/o inflammatory chagnes, mild hepatomegaly (25 cm), moderate splenomegaly (16.7 cm), 2 mm non obstructing kidney stone on Lt  4/27 CVL placed. Pressors escalating.   4/28 intubated for TEE, CVC, HD, and A-line placed. CRRT started   Remains on CRRT  MRI on 4/30-multiple infarcts  5/4: repeat TEE shows an aortic valve abscess  5/5: extubated , self-removed HD cath so CRRT holiday> developed severe hyperkalemia overnight.  5/6: new temp HD cath placed   5/8: new temp HD cath placed after prior one self-removed by patient  5/9: Orthopantogram neg for abscess  5/10: HD cath replaced by IR [facility]  5/12 to OR for AVR, aortic root reconstruction, closure of aortic sinus track. To 2H ICU on vent post op. 4 units PRBC given intraop.   5/13 extubated, on CRRT, remains on pressors  5/18, left AMA.  Currently plan is work on disposition.  Assessment and Plan: Aortic valve endocarditiswith perivalvular abscess, Serratia bacteremia and septic emboli causing strokes -MRI brain 07/22/20 revealed dozens of primary punctate acute infarcts scattered throughout the cerebellum and both cerebral hemispheres-several are associated with petechial blood products -Status postmechanicalaortic valve replacement, aortic root reconstruction and closure of aortic sinus track -Needs 6 weeks of cefepime per ID with start date being  5/13 -Continue Coumadin per pharmacy to keep INR 2.5-3.5 -He is able to ambulate without difficulty  Recent cardiogenic shock, elevated total bilirubin and acute hypoxemic respiratory failure requiring intubation -Secondary to above  AKI, electrolyte abnormalities -Suspected to be secondary to ATN related to septic shock -Required CRRT which was started on 4/28 -He pulled out his HD catheter on 5/5 and 5/8 -Making urine.  Nephrology considering intermittent dialysis pending renal function. Currently does not need acute HD.  Monitor.  Anemia/thrombocytopenia secondary to acute infection -Status post 6units of PRBC during his last hospital stay -Hemoglobin stable.  Transfuse as needed.  Thrombocytopenia  steadily improving Right lower extremity Doppler shows edema.  No evidence of DVT.  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 Patient is on sliding scale insulin.  Was on carb modified diet during last admission.  Hemoglobin A1c was 5.7. I do not think the patient requires carb modified diet.  We will monitor.  Obesity. placing the patient at risk for poor outcome. Body mass index is 34.56 kg/m.   Diet: Low-sodium diet. DVT Prophylaxis:   Place TED hose Start: 08/10/20 0757    Advance goals of care discussion: Full code  Family Communication: no family was present at bedside, at the time of interview.  Discussed with mother on 5/18, 5/20. The pt provided permission to discuss medical plan with the family. Opportunity was given to ask question and all questions were answered satisfactorily.   Disposition:  Status is: Inpatient  Remains inpatient appropriate because:Unsafe d/c plan  Dispo: The patient is from: Home  Anticipated d/c is to: Home              Patient currently is not medically stable to d/c.   Difficult to place patient  No  Subjective: No nausea no vomiting.  Continues to have leg swelling.  Reports constipation.  Physical Exam:  General: Appear in mild distress, no Rash; Oral Mucosa Clear, moist. no Abnormal Neck Mass Or lumps, Conjunctiva normal  Cardiovascular: S1 and S2 Present, no Murmur, Respiratory: good respiratory effort, Bilateral Air entry present and CTA, no Crackles, no wheezes Abdomen: Bowel Sound present, Soft and no tenderness Extremities: Bilateral pedal edema Neurology: alert and oriented to time, place, and person affect appropriate. no new focal deficit Gait not checked due to patient safety concerns  Vitals:   08/11/20 0538 08/11/20 0816 08/11/20 1126 08/11/20 1640  BP: 132/87 106/61 119/75 (!) 134/91  Pulse: 81 87 82 81  Resp: '19 18 17 15  '$ Temp: 98.3 F (36.8 C) 98.3 F (36.8 C) (!) 97.5 F (36.4 C) 97.7 F (36.5 C)  TempSrc: Oral Oral Oral Oral  SpO2: 99% 98% 95% 98%  Weight: 128.8 kg     Height:        Intake/Output Summary (Last 24 hours) at 08/11/2020 2035 Last data filed at 08/11/2020 1800 Gross per 24 hour  Intake 1010 ml  Output 1100 ml  Net -90 ml   Filed Weights   08/08/20 1528 08/08/20 2147 08/11/20 0538  Weight: 131.5 kg 125.1 kg 128.8 kg    Data Reviewed: I have personally reviewed and interpreted daily labs, tele strips, imaging. I reviewed all nursing notes, pharmacy notes, vitals, pertinent old records I have discussed plan of care as described above with RN and patient/family.  CBC: Recent Labs  Lab 08/07/20 0153 08/07/20 1746 08/08/20 1539 08/09/20 0212 08/10/20 0107 08/11/20 0126  WBC 7.3  --  10.6* 8.8 10.2 9.4  HGB 6.6* 8.4* 8.9* 7.7* 7.9* 7.7*  HCT 20.7* 25.2* 28.6* 24.6* 25.2* 24.6*  MCV 88.5  --  90.5 88.8 88.7 89.5  PLT 149*  --  266 228 276 [id number]   Basic Metabolic Panel: Recent Labs  Lab 08/05/20 0254 08/05/20 1623 08/06/20 0419 08/06/20 1605 08/07/20 0153 08/07/20 1746 08/08/20 1540 08/09/20 0212 08/10/20 0107  08/11/20 0126  NA 130*   < > 131* 131* 129* 133* 133* 132* 131* 130*  K 3.9   < > 4.2 4.0 4.2 4.4 4.0 3.7 4.2 4.2  CL 100   < > 99 100 99 100 101 101 100 100  CO2 24   < > '25 22 22 26 25 24 22 '$ 20*  GLUCOSE 91   < > 80 92 74 106* 112* 118* 94 83  BUN 27*   < > 31* 41* 46* 22* 35* 38* 52* 56*  CREATININE 2.93*   < > 3.53* 4.56* 4.96* 3.25* 4.62* 4.90* 5.46* 5.70*  CALCIUM 7.6*   < > 7.9* 7.6* 7.7* 7.6* 7.9* 7.6* 7.9* 7.7*  MG 2.7*  --  2.6*  --  2.7*  --   --   --   --   --   PHOS 4.0   < > 4.8* 5.4* 6.1* 3.2  --   --  6.3* 7.0*   < > = values in this interval not displayed.    Studies: DG Abd Portable 1V  Result Date: 08/11/2020 CLINICAL DATA:  Constipation. EXAM: PORTABLE ABDOMEN - 1 VIEW COMPARISON:  Jul 25, 2020. FINDINGS: The bowel gas pattern is  normal. Mild amount of stool may be present in the colon. No radio-opaque calculi or other significant radiographic abnormality are seen. IMPRESSION: Possible mild stool burden.  No abnormal bowel dilatation. Electronically Signed   By: Marijo Conception M.D.   On: 08/11/2020 20:18    Scheduled Meds: . (feeding supplement) PROSource Plus  30 mL Oral BID BM  . buprenorphine-naloxone  1 tablet Sublingual BID  . Chlorhexidine Gluconate Cloth  6 each Topical Daily  . lidocaine  1 patch Transdermal Q24H  . melatonin  3 mg Oral QHS  . methocarbamol  500 mg Oral TID  . nicotine  14 mg Transdermal Daily  . polyethylene glycol  17 g Oral Daily  . senna-docusate  1 tablet Oral BID  . Warfarin - Pharmacist Dosing Inpatient   Does not apply q1600   Continuous Infusions: . ceFEPime (MAXIPIME) IV 1 g (08/10/20 2133)   PRN Meds: acetaminophen **OR** acetaminophen, bisacodyl, clonazePAM, ondansetron **OR** ondansetron (ZOFRAN) IV  Time spent: 35 minutes  Author: Berle Mull, MD [facility] 08/11/2020 8:35 PM  To reach On-call, see care teams to locate the attending and reach out via [email or web address]. Between 7PM-7AM, please contact  night-coverage If you still have difficulty reaching the attending provider, please page the [facility] (Director on Call) for [facility] on amion for assistance.

## 2020-08-12 DIAGNOSIS — I339 Acute and subacute endocarditis, unspecified: Secondary | ICD-10-CM | POA: Diagnosis not present

## 2020-08-12 LAB — RENAL FUNCTION PANEL
Albumin: 1.8 g/dL — ABNORMAL LOW (ref 3.5–5.0)
Anion gap: 10 (ref 5–15)
BUN: 61 mg/dL — ABNORMAL HIGH (ref 6–20)
CO2: 20 mmol/L — ABNORMAL LOW (ref 22–32)
Calcium: 7.9 mg/dL — ABNORMAL LOW (ref 8.9–10.3)
Chloride: 99 mmol/L (ref 98–111)
Creatinine, Ser: 5.93 mg/dL — ABNORMAL HIGH (ref 0.61–1.24)
GFR, Estimated: 12 mL/min — ABNORMAL LOW (ref >60)
Glucose, Bld: 83 mg/dL (ref 70–99)
Phosphorus: 8 mg/dL — ABNORMAL HIGH (ref 2.5–4.6)
Potassium: 4 mmol/L (ref 3.5–5.1)
Sodium: 129 mmol/L — ABNORMAL LOW (ref 135–145)

## 2020-08-12 LAB — CBC
HCT: 26.1 % — ABNORMAL LOW (ref 39.0–52.0)
Hemoglobin: 8.4 g/dL — ABNORMAL LOW (ref 13.0–17.0)
MCH: 28.5 pg (ref 26.0–34.0)
MCHC: 32.2 g/dL (ref 30.0–36.0)
MCV: 88.5 fL (ref 80.0–100.0)
Platelets: 261 10*3/uL (ref 150–400)
RBC: 2.95 MIL/uL — ABNORMAL LOW (ref 4.22–5.81)
RDW: 16.1 % — ABNORMAL HIGH (ref 11.5–15.5)
WBC: 9.5 10*3/uL (ref 4.0–10.5)
nRBC: 0 % (ref 0.0–0.2)

## 2020-08-12 LAB — PROTIME-INR
INR: 2.8 — ABNORMAL HIGH (ref 0.8–1.2)
Prothrombin Time: 29.9 seconds — ABNORMAL HIGH (ref 11.4–15.2)

## 2020-08-12 MED ORDER — LACTULOSE 10 GM/15ML PO SOLN
30.0000 g | Freq: Two times a day (BID) | ORAL | Status: DC
  Administered 2020-08-12 – 2020-08-17 (×4): 30 g via ORAL
  Filled 2020-08-12 (×10): qty 45

## 2020-08-12 MED ORDER — WARFARIN SODIUM 4 MG PO TABS
4.0000 mg | ORAL_TABLET | Freq: Once | ORAL | Status: AC
  Administered 2020-08-12: 4 mg via ORAL
  Filled 2020-08-12: qty 1

## 2020-08-12 NOTE — Progress Notes (Signed)
Admit: 08/08/2020 LOS: 4  55M AV IE and perivalvular abscess, serrata sp.   AVR repair 5/12;  Anuric AKI from ATN-  Normal crt in Feb 2022 then 5 on 07/18/20 in the setting of IE  S: Patient feels well with no complaints.  Stable urine output of 1 L.  Creatinine minimally increased to 5.9.  O: 05/20 0701 - 05/21 0700 In: 1340 [P.O.:1240; IV Piggyback:100] Out: 1075 [Urine:1075]   Vitals:   08/12/20 0340 08/12/20 0830  BP: 128/84 133/82  Pulse: 81 87  Resp: 12 16  Temp: 98.2 F (36.8 C) 97.9 F (36.6 C)  SpO2: 97% 93%     GEN: Lying in bed, no distress ENT: no nasal discharge, mmm, poor dentition EYES: no scleral icterus, eomi CV: normal rate,  PULM: no iwob, bilateral chest rise ABD: NABS, non-distended SKIN: no rashes or jaundice EXT: 1+ pitting edema in the bilateral lower extremities, warm and well perfused   Filed Weights   08/08/20 2147 08/11/20 0538 08/12/20 0340  Weight: 125.1 kg 128.8 kg 128.8 kg    Recent Labs  Lab 08/10/20 0107 08/11/20 0126 08/12/20 0054  NA 131* 130* 129*  K 4.2 4.2 4.0  CL 100 100 99  CO2 22 20* 20*  GLUCOSE 94 83 83  BUN 52* 56* 61*  CREATININE 5.46* 5.70* 5.93*  CALCIUM 7.9* 7.7* 7.9*  PHOS 6.3* 7.0* 8.0*   Recent Labs  Lab 08/10/20 0107 08/11/20 0126 08/12/20 0054  WBC 10.2 9.4 9.5  HGB 7.9* 7.7* 8.4*  HCT 25.2* 24.6* 26.1*  MCV 88.7 89.5 88.5  PLT 276 277 261    Scheduled Meds: . (feeding supplement) PROSource Plus  30 mL Oral BID BM  . buprenorphine-naloxone  1 tablet Sublingual BID  . Chlorhexidine Gluconate Cloth  6 each Topical Daily  . lidocaine  1 patch Transdermal Q24H  . melatonin  3 mg Oral QHS  . methocarbamol  500 mg Oral TID  . nicotine  14 mg Transdermal Daily  . polyethylene glycol  17 g Oral Daily  . senna-docusate  1 tablet Oral BID  . Warfarin - Pharmacist Dosing Inpatient   Does not apply q1600   Continuous Infusions: . ceFEPime (MAXIPIME) IV Stopped (08/11/20 2238)   PRN  Meds:.acetaminophen **OR** acetaminophen, bisacodyl, clonazePAM, ondansetron **OR** ondansetron (ZOFRAN) IV  ABG    Component Value Date/Time   PHART 7.405 08/03/2020 2033   PCO2ART 31.4 (L) 08/03/2020 2033   PO2ART 81 (L) 08/03/2020 2033   HCO3 19.6 (L) 08/03/2020 2033   TCO2 21 (L) 08/03/2020 2033   ACIDBASEDEF 4.0 (H) 08/03/2020 2033   O2SAT 96.0 08/03/2020 2033    A/P  1. Severe AKI AKI; likely secondary to ATN related to infection and surgery.  On and off CRRT transition to hemodialysis on 5/16.  Now with renal recovery.  Creatinine nearly stable.  Continue to monitor for improving creatinine.  We will remove dialysis catheter once creatinine trending down. 2. 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  Management per primary team 3. VDRF, resolved, normal intermittently on BiPAP--- extubated  4. Anemia, transfusion per CCM-  On aranesp qwed-  Has been a chronic issue-  Needing many transfusions.  Hemoglobin now improving. 5. Hyponatremia: Likely related to volume excess.  Continue to monitor 6. Hyperphosphatemia: Phosphorus increased to 8 today.  Continue to monitor.  Hopefully will recover with improving kidney function.    Reesa Chew  [facility] 

## 2020-08-12 NOTE — Progress Notes (Signed)
ANTICOAGULATION CONSULT NOTE - Follow Up Consult  Pharmacy Consult for Warfarin  Indication: mechanical aortic valve  No Known Allergies  Patient Measurements: Height: '6\' 4"'$  (193 cm) Weight: 128.8 kg (284 lb 0.6 oz) IBW/kg (Calculated) : 86.8  Vital Signs: Temp: 97.9 F (36.6 C) (05/21 0830) Temp Source: Oral (05/21 0830) BP: 133/82 (05/21 0830) Pulse Rate: 87 (05/21 0830)  Labs: Recent Labs    08/10/20 0107 08/11/20 0126 08/12/20 0054  HGB 7.9* 7.7* 8.4*  HCT 25.2* 24.6* 26.1*  PLT 276 277 261  LABPROT 23.8* 25.6* 29.9*  INR 2.1* 2.3* 2.8*  CREATININE 5.46* 5.70* 5.93*   - dialysis-dependent  Assessment: 39yom with recent endocarditis> AV root repair and Mechanical AVR on 08/03/20. Warfarin started 5/13. Left AMA 5/16 pm then returned 5/17 pm. INR 2.6 on 5/18 and Warfarin resumed.   INR today therapeutic at 2.8 up from 2.3 yesterday. CBC stable, no s/sx bleeding.  Goal of Therapy:  INR goal 2.5-3.5 per Dr. Kipp Brood Monitor platelets by anticoagulation protocol: Yes    Plan:  Repeat Warfarin 4 mg x 1 today. Monitor daily PT/INR.  Mercy Riding, PharmD PGY1 Acute Care Pharmacy Resident Please refer to [facility] for unit-specific pharmacist

## 2020-08-12 NOTE — Progress Notes (Addendum)
[facility] Progress Note  Patient: Alex [name]    [id number]  DOA: 08/08/2020     Date of Service: the patient was seen and examined on 08/12/2020  Brief hospital course: Past medical history of polysubstance abuse, aortic valve endocarditis with recent AVR, Serratia bacteremia, septic emboli causing stroke and acute kidney injury requiring HD.  Left the hospital AMA on 5/17 and came back the same night after discussion with cardiothoracic surgery.  4/26 transfer from [facility] to [facility]  CT head 4/26 >> no acute findings  CT abd/pelvis 4/26 >> cholelithiasis w/o inflammatory chagnes, mild hepatomegaly (25 cm), moderate splenomegaly (16.7 cm), 2 mm non obstructing kidney stone on Lt  4/27 CVL placed. Pressors escalating.   4/28 intubated for TEE, CVC, HD, and A-line placed. CRRT started   Remains on CRRT  MRI on 4/30-multiple infarcts  5/4: repeat TEE shows an aortic valve abscess  5/5: extubated , self-removed HD cath so CRRT holiday> developed severe hyperkalemia overnight.  5/6: new temp HD cath placed   5/8: new temp HD cath placed after prior one self-removed by patient  5/9: Orthopantogram neg for abscess  5/10: HD cath replaced by IR [facility]  5/12 to OR for AVR, aortic root reconstruction, closure of aortic sinus track. To 2H ICU on vent post op. 4 units PRBC given intraop.   5/13 extubated, on CRRT, remains on pressors  5/18, left AMA.  Currently plan is work on disposition.  Assessment and Plan: Aortic valve endocarditiswith perivalvular abscess, Serratia bacteremia and septic emboli causing strokes -MRI brain 07/22/20 revealed dozens of primary punctate acute infarcts scattered throughout the cerebellum and both cerebral hemispheres-several are associated with petechial blood products -Status postmechanicalaortic valve replacement, aortic root reconstruction and closure of aortic sinus track -Needs 6 weeks of cefepime per ID with start date being  5/13 -Continue Coumadin per pharmacy to keep INR 2.5-3.5 -He is able to ambulate without difficulty  Recent cardiogenic shock, elevated total bilirubin and acute hypoxemic respiratory failure requiring intubation -Secondary to above  AKI, hyponatremia -Suspected to be secondary to ATN related to septic shock -Required CRRT which was started on 4/28 -He pulled out his HD catheter on 5/5 and 5/8 -Making urine.  Nephrology considering intermittent dialysis pending renal function.  Mild hyponatremia likely in the setting of volume overload. Currently does not need acute HD.  Monitor.  Anemia/thrombocytopenia secondary to acute infection -Status post 6units of PRBC during his last hospital stay -Hemoglobin stable.  Transfuse as needed.  Thrombocytopenia  steadily improving Right lower extremity Doppler shows edema.  No evidence of DVT.  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 Patient is on sliding scale insulin.  Was on carb modified diet during last admission.  Hemoglobin A1c was 5.7. I do not think the patient requires carb modified diet.  We will monitor.  Obesity. placing the patient at risk for poor outcome. Body mass index is 34.57 kg/m.   Bilateral lower extremity edema. In the setting of recently as well as heart disease. Will try Unna boots.  Monitor.  Constipation. Medication adjusted.  Diet: Low-sodium diet. DVT Prophylaxis:   Place TED hose Start: 08/10/20 0757    Advance goals of care discussion: Full code  Family Communication: no family was present at bedside, at the time of interview.  Discussed with mother on 5/18, 5/20.  5/21 The pt provided permission to discuss medical plan with the family. Opportunity was given  to ask question and all questions were answered satisfactorily.   Disposition:  Status is: Inpatient  Remains inpatient  appropriate because:Unsafe d/c plan  Dispo: The patient is from: Home              Anticipated d/c is to: Home              Patient currently is not medically stable to d/c.   Difficult to place patient No  Subjective: Constipation gradually improving.  No nausea or vomiting.  Chest pain when he is lying down flat.  Physical Exam:  General: Appear in mild distress, no Rash; Oral Mucosa Clear, moist. no Abnormal Neck Mass Or lumps, Conjunctiva normal  Cardiovascular: S1 and S2 Present, no Murmur, Respiratory: good respiratory effort, Bilateral Air entry present and CTA, no Crackles, no wheezes Abdomen: Bowel Sound present, Soft and no tenderness Extremities: Bilateral pedal edema Neurology: alert and oriented to time, place, and person affect appropriate. no new focal deficit Gait not checked due to patient safety concerns  Vitals:   08/11/20 2312 08/12/20 0340 08/12/20 0830 08/12/20 1831  BP: 133/85 128/84 133/82 138/88  Pulse: 88 81 87 85  Resp: '20 12 16 16  '$ Temp: 98.2 F (36.8 C) 98.2 F (36.8 C) 97.9 F (36.6 C) 98.4 F (36.9 C)  TempSrc: Oral Oral Oral Oral  SpO2: 99% 97% 93% 98%  Weight:  128.8 kg    Height:        Intake/Output Summary (Last 24 hours) at 08/12/2020 1901 Last data filed at 08/12/2020 1821 Gross per 24 hour  Intake 550 ml  Output 1075 ml  Net -525 ml   Filed Weights   08/08/20 2147 08/11/20 0538 08/12/20 0340  Weight: 125.1 kg 128.8 kg 128.8 kg    Data Reviewed: I have personally reviewed and interpreted daily labs, tele strips, imaging. I reviewed all nursing notes, pharmacy notes, vitals, pertinent old records I have discussed plan of care as described above with RN and patient/family.  CBC: Recent Labs  Lab 08/08/20 1539 08/09/20 0212 08/10/20 0107 08/11/20 0126 08/12/20 0054  WBC 10.6* 8.8 10.2 9.4 9.5  HGB 8.9* 7.7* 7.9* 7.7* 8.4*  HCT 28.6* 24.6* 25.2* 24.6* 26.1*  MCV 90.5 88.8 88.7 89.5 88.5  PLT 266 228 276 277 0000000   Basic  Metabolic Panel: Recent Labs  Lab 08/06/20 0419 08/06/20 1605 08/07/20 0153 08/07/20 1746 08/08/20 1540 08/09/20 0212 08/10/20 0107 08/11/20 0126 08/12/20 0054  NA 131*   < > 129* 133* 133* 132* 131* 130* 129*  K 4.2   < > 4.2 4.4 4.0 3.7 4.2 4.2 4.0  CL 99   < > 99 100 101 101 100 100 99  CO2 25   < > '22 26 25 24 22 '$ 20* 20*  GLUCOSE 80   < > 74 106* 112* 118* 94 83 83  BUN 31*   < > 46* 22* 35* 38* 52* 56* 61*  CREATININE 3.53*   < > 4.96* 3.25* 4.62* 4.90* 5.46* 5.70* 5.93*  CALCIUM 7.9*   < > 7.7* 7.6* 7.9* 7.6* 7.9* 7.7* 7.9*  MG 2.6*  --  2.7*  --   --   --   --   --   --   PHOS 4.8*   < > 6.1* 3.2  --   --  6.3* 7.0* 8.0*   < > = values in this interval not displayed.    Studies: DG Abd Portable 1V  Result Date: 08/11/2020 CLINICAL  DATA:  Constipation. EXAM: PORTABLE ABDOMEN - 1 VIEW COMPARISON:  Jul 25, 2020. FINDINGS: The bowel gas pattern is normal. Mild amount of stool may be present in the colon. No radio-opaque calculi or other significant radiographic abnormality are seen. IMPRESSION: Possible mild stool burden.  No abnormal bowel dilatation. Electronically Signed   By: Marijo Conception M.D.   On: 08/11/2020 20:18    Scheduled Meds: . (feeding supplement) PROSource Plus  30 mL Oral BID BM  . buprenorphine-naloxone  1 tablet Sublingual BID  . Chlorhexidine Gluconate Cloth  6 each Topical Daily  . lactulose  30 g Oral BID  . lidocaine  1 patch Transdermal Q24H  . melatonin  3 mg Oral QHS  . methocarbamol  500 mg Oral TID  . nicotine  14 mg Transdermal Daily  . polyethylene glycol  17 g Oral Daily  . senna-docusate  1 tablet Oral BID  . Warfarin - Pharmacist Dosing Inpatient   Does not apply q1600   Continuous Infusions: . ceFEPime (MAXIPIME) IV Stopped (08/11/20 2238)   PRN Meds: acetaminophen **OR** acetaminophen, bisacodyl, clonazePAM, ondansetron **OR** ondansetron (ZOFRAN) IV  Time spent: 35 minutes  Author: Berle Mull, MD [facility] 08/12/2020 7:01 PM  To reach On-call, see care teams to locate the attending and reach out via [email or web address]. Between 7PM-7AM, please contact night-coverage If you still have difficulty reaching the attending provider, please page the [facility] (Director on Call) for [facility] on amion for assistance.

## 2020-08-13 DIAGNOSIS — I339 Acute and subacute endocarditis, unspecified: Secondary | ICD-10-CM | POA: Diagnosis not present

## 2020-08-13 LAB — CBC
HCT: 26 % — ABNORMAL LOW (ref 39.0–52.0)
Hemoglobin: 8.6 g/dL — ABNORMAL LOW (ref 13.0–17.0)
MCH: 28.6 pg (ref 26.0–34.0)
MCHC: 33.1 g/dL (ref 30.0–36.0)
MCV: 86.4 fL (ref 80.0–100.0)
Platelets: 293 10*3/uL (ref 150–400)
RBC: 3.01 MIL/uL — ABNORMAL LOW (ref 4.22–5.81)
RDW: 16 % — ABNORMAL HIGH (ref 11.5–15.5)
WBC: 10.2 10*3/uL (ref 4.0–10.5)
nRBC: 0 % (ref 0.0–0.2)

## 2020-08-13 LAB — RENAL FUNCTION PANEL
Albumin: 1.9 g/dL — ABNORMAL LOW (ref 3.5–5.0)
Anion gap: 10 (ref 5–15)
BUN: 63 mg/dL — ABNORMAL HIGH (ref 6–20)
CO2: 20 mmol/L — ABNORMAL LOW (ref 22–32)
Calcium: 8.1 mg/dL — ABNORMAL LOW (ref 8.9–10.3)
Chloride: 98 mmol/L (ref 98–111)
Creatinine, Ser: 5.78 mg/dL — ABNORMAL HIGH (ref 0.61–1.24)
GFR, Estimated: 12 mL/min — ABNORMAL LOW (ref >60)
Glucose, Bld: 94 mg/dL (ref 70–99)
Phosphorus: 8.1 mg/dL — ABNORMAL HIGH (ref 2.5–4.6)
Potassium: 4.5 mmol/L (ref 3.5–5.1)
Sodium: 128 mmol/L — ABNORMAL LOW (ref 135–145)

## 2020-08-13 LAB — PROTIME-INR
INR: 3.6 — ABNORMAL HIGH (ref 0.8–1.2)
Prothrombin Time: 35.6 seconds — ABNORMAL HIGH (ref 11.4–15.2)

## 2020-08-13 MED ORDER — LACTULOSE ENEMA
300.0000 mL | Freq: Once | ORAL | Status: DC
  Filled 2020-08-13: qty 300

## 2020-08-13 MED ORDER — SODIUM CHLORIDE 0.9 % IV SOLN
2.0000 g | INTRAVENOUS | Status: DC
  Administered 2020-08-13 – 2020-08-19 (×6): 2 g via INTRAVENOUS
  Filled 2020-08-13 (×7): qty 2

## 2020-08-13 MED ORDER — WARFARIN SODIUM 1 MG PO TABS
1.0000 mg | ORAL_TABLET | Freq: Once | ORAL | Status: AC
  Administered 2020-08-13: 1 mg via ORAL
  Filled 2020-08-13: qty 1

## 2020-08-13 NOTE — Progress Notes (Signed)
ANTICOAGULATION CONSULT NOTE - Follow Up Consult  Pharmacy Consult for Warfarin  Indication: mechanical aortic valve  No Known Allergies  Patient Measurements: Height: '6\' 4"'$  (193 cm) Weight: 129.9 kg (286 lb 6.4 oz) IBW/kg (Calculated) : 86.8  Vital Signs: Temp: 97.6 F (36.4 C) (05/22 0721) Temp Source: Oral (05/22 0721) BP: 137/88 (05/22 0721) Pulse Rate: 87 (05/22 0721)  Labs: Recent Labs    08/11/20 0126 08/12/20 0054 08/13/20 0230  HGB 7.7* 8.4* 8.6*  HCT 24.6* 26.1* 26.0*  PLT 277 261 293  LABPROT 25.6* 29.9* 35.6*  INR 2.3* 2.8* 3.6*  CREATININE 5.70* 5.93* 5.78*   - dialysis-dependent  Assessment: 39yom with recent endocarditis> AV root repair and Mechanical AVR on 08/03/20. Warfarin started 5/13. Left AMA 5/16 pm then returned 5/17 pm. INR 2.6 on 5/18 and Warfarin resumed.   INR increased significantly from 2.8 to 3.6 today, now slightly supratherapeutic. CBC stable, no s/sx bleeding per discussion with RN. Good PO intake charted.   Will give a small dose of warfarin to prevent INR from dropping since pt has had labile INRs this admission.  Goal of Therapy:  INR goal 2.5-3.5 per Dr. Kipp Brood Monitor platelets by anticoagulation protocol: Yes    Plan:  Give warfarin '1mg'$  x 1 today Monitor daily INR, s/sx bleeding  Mercy Riding, PharmD PGY1 Acute Care Pharmacy Resident Please refer to [facility] for unit-specific pharmacist

## 2020-08-13 NOTE — Progress Notes (Signed)
Orthopedic Tech Progress Note Patient Details:  Alex [name] 14-Aug-1980 [id number]  Ortho Devices Type of Ortho Device: Louretta Parma boot Ortho Device/Splint Location: Bilateral Lower Extremity Ortho Device/Splint Interventions: Ordered,Application   Post Interventions Patient Tolerated: Well Instructions Provided: Care of device,Poper ambulation with device   Alex [name] 08/13/2020, 1:29 PM

## 2020-08-13 NOTE — Progress Notes (Signed)
Admit: 08/08/2020 LOS: 5  77M AV IE and perivalvular abscess, serrata sp.   AVR repair 5/12;  Anuric AKI from ATN-  Normal crt in Feb 2022 then 5 on 07/18/20 in the setting of IE  S: Patient feels fine today.  Continues to have constipation.  Urine output about the same.  Creatinine minimally improved today.  O: 05/21 0701 - 05/22 0700 In: 100 [IV Piggyback:100] Out: 700 [Urine:700]   Vitals:   08/13/20 0445 08/13/20 0721  BP: 130/83 137/88  Pulse: 86 87  Resp: 16 (!) 21  Temp: 97.9 F (36.6 C) 97.6 F (36.4 C)  SpO2: 95% 96%     GEN: Sitting in bed, no distress ENT: no nasal discharge, mmm, poor dentition EYES: no scleral icterus, eomi CV: normal rate,  PULM: no iwob, bilateral chest rise ABD: NABS, non-distended SKIN: no rashes or jaundice EXT: 1+ pitting edema in the bilateral lower extremities, warm and well perfused   Filed Weights   08/11/20 0538 08/12/20 0340 08/13/20 0445  Weight: 128.8 kg 128.8 kg 129.9 kg    Recent Labs  Lab 08/11/20 0126 08/12/20 0054 08/13/20 0230  NA 130* 129* 128*  K 4.2 4.0 4.5  CL 100 99 98  CO2 20* 20* 20*  GLUCOSE 83 83 94  BUN 56* 61* 63*  CREATININE 5.70* 5.93* 5.78*  CALCIUM 7.7* 7.9* 8.1*  PHOS 7.0* 8.0* 8.1*   Recent Labs  Lab 08/11/20 0126 08/12/20 0054 08/13/20 0230  WBC 9.4 9.5 10.2  HGB 7.7* 8.4* 8.6*  HCT 24.6* 26.1* 26.0*  MCV 89.5 88.5 86.4  PLT 277 261 293    Scheduled Meds: . (feeding supplement) PROSource Plus  30 mL Oral BID BM  . buprenorphine-naloxone  1 tablet Sublingual BID  . Chlorhexidine Gluconate Cloth  6 each Topical Daily  . lactulose  30 g Oral BID  . lactulose  300 mL Rectal Once  . lidocaine  1 patch Transdermal Q24H  . melatonin  3 mg Oral QHS  . methocarbamol  500 mg Oral TID  . nicotine  14 mg Transdermal Daily  . polyethylene glycol  17 g Oral Daily  . senna-docusate  1 tablet Oral BID  . Warfarin - Pharmacist Dosing Inpatient   Does not apply q1600   Continuous  Infusions: . ceFEPime (MAXIPIME) IV 1 g (08/12/20 2218)   PRN Meds:.acetaminophen **OR** acetaminophen, bisacodyl, clonazePAM, ondansetron **OR** ondansetron (ZOFRAN) IV  ABG    Component Value Date/Time   PHART 7.405 08/03/2020 2033   PCO2ART 31.4 (L) 08/03/2020 2033   PO2ART 81 (L) 08/03/2020 2033   HCO3 19.6 (L) 08/03/2020 2033   TCO2 21 (L) 08/03/2020 2033   ACIDBASEDEF 4.0 (H) 08/03/2020 2033   O2SAT 96.0 08/03/2020 2033    A/P  1. Severe AKI AKI; likely secondary to ATN related to infection and surgery.  On and off CRRT transition to hemodialysis on 5/16.  Now with renal recovery.  Creatinine slightly improved.  Continue to monitor for improving creatinine.  Hopefully can remove dialysis catheter early next week if creatinine continues to improve 2. 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  Management per primary team 3. VDRF, resolved, normal intermittently on BiPAP--- extubated  4. Anemia, transfusion per CCM-  On aranesp qwed-  Has been a chronic issue-  Needing many transfusions.  Hemoglobin now improving. 5. Hyponatremia: Likely related to volume excess.  Continue to monitor 6. Hyperphosphatemia: Phosphorus increased  to 8.1 today.  Continue to monitor.  Hopefully will recover with improving kidney function. 7. Constipation: Enema ordered today    [facility]

## 2020-08-13 NOTE — Progress Notes (Signed)
Foam dressings applied to pts sacral wounds. Wound care consult pending.

## 2020-08-13 NOTE — Progress Notes (Signed)
[facility] Progress Note  Patient: Alex [name]    [id number]  DOA: 08/08/2020     Date of Service: the patient was seen and examined on 08/13/2020  Brief hospital course: Past medical history of polysubstance abuse, aortic valve endocarditis with recent AVR, Serratia bacteremia, septic emboli causing stroke and acute kidney injury requiring HD.  Left the hospital AMA on 5/17 and came back the same night after discussion with cardiothoracic surgery.  4/26 transfer from [facility] to [facility]  CT head 4/26 >> no acute findings  CT abd/pelvis 4/26 >> cholelithiasis w/o inflammatory chagnes, mild hepatomegaly (25 cm), moderate splenomegaly (16.7 cm), 2 mm non obstructing kidney stone on Lt  4/27 CVL placed. Pressors escalating.   4/28 intubated for TEE, CVC, HD, and A-line placed. CRRT started   Remains on CRRT  MRI on 4/30-multiple infarcts  5/4: repeat TEE shows an aortic valve abscess  5/5: extubated , self-removed HD cath so CRRT holiday> developed severe hyperkalemia overnight.  5/6: new temp HD cath placed   5/8: new temp HD cath placed after prior one self-removed by patient  5/9: Orthopantogram neg for abscess  5/10: HD cath replaced by IR [facility]  5/12 to OR for AVR, aortic root reconstruction, closure of aortic sinus track. To 2H ICU on vent post op. 4 units PRBC given intraop.   5/13 extubated, on CRRT, remains on pressors  5/18, left AMA.  Currently plan is work on disposition.  Assessment and Plan: Aortic valve endocarditiswith perivalvular abscess, Serratia bacteremia and septic emboli causing strokes -MRI brain 07/22/20 revealed dozens of primary punctate acute infarcts scattered throughout the cerebellum and both cerebral hemispheres-several are associated with petechial blood products -Status postmechanicalaortic valve replacement, aortic root reconstruction and closure of aortic sinus track -Needs 6 weeks of cefepime per ID with start date being  5/13 -Continue Coumadin per pharmacy to keep INR 2.5-3.5 -He is able to ambulate without difficulty  Recent cardiogenic shock, elevated total bilirubin and acute hypoxemic respiratory failure requiring intubation -Secondary to above  AKI, hyponatremia -Suspected to be secondary to ATN related to septic shock -Required CRRT which was started on 4/28 -He pulled out his HD catheter on 5/5 and 5/8 -Making urine.  Nephrology considering intermittent dialysis pending renal function.  Mild hyponatremia likely in the setting of volume overload. Currently does not need acute HD.  Monitor. Anticipating the patient will recover to normal renal function.  Anemia/thrombocytopenia secondary to acute infection -Status post 6units of PRBC during his last hospital stay -Hemoglobin stable.  Transfuse as needed.  Thrombocytopenia  steadily improving Right lower extremity Doppler shows edema.  No evidence of DVT.  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 Patient is on sliding scale insulin.  Was on carb modified diet during last admission.  Hemoglobin A1c was 5.7. I do not think the patient requires carb modified diet.  We will monitor.  Obesity. placing the patient at risk for poor outcome. Body mass index is 34.86 kg/m.   Bilateral lower extremity edema. In the setting of recently as well as heart disease. Will try Unna boots.  Monitor.  Constipation. Medication adjusted.  Resolved.  Diet: Low-sodium diet. DVT Prophylaxis:   Place TED hose Start: 08/10/20 0757    Advance goals of care discussion: Full code  Family Communication: no family was present at bedside, at the time of interview.  Discussed with mother on 5/18, 5/20.  5/21, 5/22 The pt  provided permission to discuss medical plan with the family. Opportunity was given to ask question and all questions were answered  satisfactorily.   Disposition:  Status is: Inpatient  Remains inpatient appropriate because:Unsafe d/c plan  Dispo: The patient is from: Home              Anticipated d/c is to: Home              Patient currently is not medically stable to d/c.   Difficult to place patient No  Subjective: No nausea no vomiting but no fever no chills.  No dizziness or lightheadedness.  Physical Exam:  General: Appear in mild distress, no Rash; Oral Mucosa Clear, moist. no Abnormal Neck Mass Or lumps, Conjunctiva normal  Cardiovascular: S1 and S2 Present, no Murmur, Respiratory: good respiratory effort, Bilateral Air entry present and CTA, no Crackles, no wheezes Abdomen: Bowel Sound present, Soft and no tenderness Extremities: bilateral Pedal edema Neurology: alert and oriented to time, place, and person affect appropriate. no new focal deficit Gait not checked due to patient safety concerns  Vitals:   08/12/20 2348 08/13/20 0445 08/13/20 0721 08/13/20 1625  BP: 116/68 130/83 137/88 (!) 137/92  Pulse: 85 86 87 86  Resp: 16 16 (!) 21 15  Temp: 97.9 F (36.6 C) 97.9 F (36.6 C) 97.6 F (36.4 C) 97.6 F (36.4 C)  TempSrc: Oral Oral Oral Oral  SpO2: 94% 95% 96% 99%  Weight:  129.9 kg    Height:        Intake/Output Summary (Last 24 hours) at 08/13/2020 1950 Last data filed at 08/13/2020 1639 Gross per 24 hour  Intake 1300 ml  Output 1001 ml  Net 299 ml   Filed Weights   08/11/20 0538 08/12/20 0340 08/13/20 0445  Weight: 128.8 kg 128.8 kg 129.9 kg    Data Reviewed: I have personally reviewed and interpreted daily labs, tele strips, imaging. I reviewed all nursing notes, pharmacy notes, vitals, pertinent old records I have discussed plan of care as described above with RN and patient/family.  CBC: Recent Labs  Lab 08/09/20 0212 08/10/20 0107 08/11/20 0126 08/12/20 0054 08/13/20 0230  WBC 8.8 10.2 9.4 9.5 10.2  HGB 7.7* 7.9* 7.7* 8.4* 8.6*  HCT 24.6* 25.2* 24.6* 26.1* 26.0*   MCV 88.8 88.7 89.5 88.5 86.4  PLT 228 276 277 261 0000000   Basic Metabolic Panel: Recent Labs  Lab 08/07/20 0153 08/07/20 1746 08/08/20 1540 08/09/20 0212 08/10/20 0107 08/11/20 0126 08/12/20 0054 08/13/20 0230  NA 129* 133*   < > 132* 131* 130* 129* 128*  K 4.2 4.4   < > 3.7 4.2 4.2 4.0 4.5  CL 99 100   < > 101 100 100 99 98  CO2 22 26   < > 24 22 20* 20* 20*  GLUCOSE 74 106*   < > 118* 94 83 83 94  BUN 46* 22*   < > 38* 52* 56* 61* 63*  CREATININE 4.96* 3.25*   < > 4.90* 5.46* 5.70* 5.93* 5.78*  CALCIUM 7.7* 7.6*   < > 7.6* 7.9* 7.7* 7.9* 8.1*  MG 2.7*  --   --   --   --   --   --   --   PHOS 6.1* 3.2  --   --  6.3* 7.0* 8.0* 8.1*   < > = values in this interval not displayed.    Studies: No results found.  Scheduled Meds: . (feeding supplement) PROSource Plus  30  mL Oral BID BM  . buprenorphine-naloxone  1 tablet Sublingual BID  . Chlorhexidine Gluconate Cloth  6 each Topical Daily  . lactulose  30 g Oral BID  . lidocaine  1 patch Transdermal Q24H  . melatonin  3 mg Oral QHS  . methocarbamol  500 mg Oral TID  . nicotine  14 mg Transdermal Daily  . polyethylene glycol  17 g Oral Daily  . senna-docusate  1 tablet Oral BID  . Warfarin - Pharmacist Dosing Inpatient   Does not apply q1600   Continuous Infusions: . ceFEPime (MAXIPIME) IV     PRN Meds: acetaminophen **OR** acetaminophen, bisacodyl, clonazePAM, ondansetron **OR** ondansetron (ZOFRAN) IV  Time spent: 35 minutes  Author: Berle Mull, MD [facility] 08/13/2020 7:50 PM  To reach On-call, see care teams to locate the attending and reach out via [email or web address]. Between 7PM-7AM, please contact night-coverage If you still have difficulty reaching the attending provider, please page the [facility] (Director on Call) for [facility] on amion for assistance.

## 2020-08-13 NOTE — Progress Notes (Signed)
PHARMACY NOTE:  ANTIMICROBIAL RENAL DOSAGE ADJUSTMENT  Current antimicrobial regimen includes a mismatch between antimicrobial dosage and estimated renal function.  As per policy approved by the Pharmacy & Therapeutics and Medical Executive Committees, the antimicrobial dosage will be adjusted accordingly.  Current antimicrobial dosage: Cefepime 1g IV every 24 hours  Indication: Serratia bacteremia + endocarditis and septic emboli  Renal Function: SCr remains elevated at 5.78 but showing signs of recovery and pt is no longer on a HD schedule. Some UOP charted which has remained consistent. HD cath to be removed possibly this week if SCr continues to trend down.    Estimated Creatinine Clearance: 25.7 mL/min (A) (by C-G formula based on SCr of 5.78 mg/dL (H)). '[]'$      On intermittent HD, scheduled: no longer on HD schedule  '[]'$      On CRRT    Antimicrobial dosage has been changed to:  Cefepime 2g IV every 24 hours  Additional comments: Will continue to monitor renal plans daily and adjust back to cefepime 1g IV every 24 hours if pt resumes HD.   Thank you for allowing pharmacy to be a part of this patient's care.  Mercy Riding, PharmD PGY1 Acute Care Pharmacy Resident Please refer to [facility] for unit-specific pharmacist

## 2020-08-14 DIAGNOSIS — I339 Acute and subacute endocarditis, unspecified: Secondary | ICD-10-CM | POA: Diagnosis not present

## 2020-08-14 LAB — PROTIME-INR
INR: 3.6 — ABNORMAL HIGH (ref 0.8–1.2)
Prothrombin Time: 35.7 seconds — ABNORMAL HIGH (ref 11.4–15.2)

## 2020-08-14 LAB — CBC
HCT: 27.1 % — ABNORMAL LOW (ref 39.0–52.0)
Hemoglobin: 8.8 g/dL — ABNORMAL LOW (ref 13.0–17.0)
MCH: 28 pg (ref 26.0–34.0)
MCHC: 32.5 g/dL (ref 30.0–36.0)
MCV: 86.3 fL (ref 80.0–100.0)
Platelets: 319 10*3/uL (ref 150–400)
RBC: 3.14 MIL/uL — ABNORMAL LOW (ref 4.22–5.81)
RDW: 15.7 % — ABNORMAL HIGH (ref 11.5–15.5)
WBC: 10 10*3/uL (ref 4.0–10.5)
nRBC: 0 % (ref 0.0–0.2)

## 2020-08-14 LAB — RENAL FUNCTION PANEL
Albumin: 1.9 g/dL — ABNORMAL LOW (ref 3.5–5.0)
Anion gap: 9 (ref 5–15)
BUN: 67 mg/dL — ABNORMAL HIGH (ref 6–20)
CO2: 20 mmol/L — ABNORMAL LOW (ref 22–32)
Calcium: 8 mg/dL — ABNORMAL LOW (ref 8.9–10.3)
Chloride: 98 mmol/L (ref 98–111)
Creatinine, Ser: 6.01 mg/dL — ABNORMAL HIGH (ref 0.61–1.24)
GFR, Estimated: 12 mL/min — ABNORMAL LOW (ref >60)
Glucose, Bld: 92 mg/dL (ref 70–99)
Phosphorus: 8.2 mg/dL — ABNORMAL HIGH (ref 2.5–4.6)
Potassium: 4.7 mmol/L (ref 3.5–5.1)
Sodium: 127 mmol/L — ABNORMAL LOW (ref 135–145)

## 2020-08-14 MED ORDER — WARFARIN SODIUM 2 MG PO TABS
2.0000 mg | ORAL_TABLET | Freq: Once | ORAL | Status: AC
  Administered 2020-08-14: 2 mg via ORAL
  Filled 2020-08-14: qty 1

## 2020-08-14 MED ORDER — CHLORHEXIDINE GLUCONATE CLOTH 2 % EX PADS: 6.0000 | MEDICATED_PAD | Freq: Every day | CUTANEOUS | Status: DC

## 2020-08-14 MED ORDER — SEVELAMER CARBONATE 800 MG PO TABS
800.0000 mg | ORAL_TABLET | Freq: Three times a day (TID) | ORAL | Status: DC
  Administered 2020-08-14 – 2020-08-18 (×12): 800 mg via ORAL
  Filled 2020-08-14 (×12): qty 1

## 2020-08-14 MED ORDER — COLLAGENASE 250 UNIT/GM EX OINT
TOPICAL_OINTMENT | Freq: Every day | CUTANEOUS | Status: DC
  Filled 2020-08-14 (×2): qty 30

## 2020-08-14 MED ORDER — DARBEPOETIN ALFA 150 MCG/0.3ML IJ SOSY
150.0000 ug | PREFILLED_SYRINGE | INTRAMUSCULAR | Status: DC
  Filled 2020-08-14: qty 0.3

## 2020-08-14 NOTE — Consult Note (Addendum)
[facility] Nurse Consult Note:  Patient receiving care in [place] Patient could stand for assessment Reason for Consult: Multiple wounds Wound type: Left buttock: yellow/red/friable full thickness wound, measures 2 cm x 1.5 cm. Intergluteal cleft fissure unstageable PI: 100% yellow, measures 6 cm x 0.5 cm x 0.2 cm  Pressure Injury POA: Yes Measurement: Wound bed: Drainage (amount, consistency, odor) brown/red drainage on foam dressing Periwound: Intact Dressing procedure/placement/frequency: Apply Santyl to intergluteal cleft fissure wound and left buttock wound in a nickel thick layer. Cover with a saline moistened gauze, then dry gauze and foam dressing.  Change daily. Patient has bilateral Unna boots that were previously ordered by Lavina Hamman, MD to be replaced on Mon and Thurs by ortho. Bedside nurse to remove, clean legs and call ortho to come and re-apply una boots.   Monitor the wound area(s) for worsening of condition such as: Signs/symptoms of infection, increase in size, development of or worsening of odor, development of pain, or increased pain at the affected locations.   Notify the medical team if any of these develop.  Thank you for the consult. [place] nurse will not follow at this time.   Please re-consult the [facility] team if needed.  Cathlean Marseilles Tamala Julian, MSN, RN, [place], Lysle Pearl, [facility] Wound Treatment Associate Pager [phone]

## 2020-08-14 NOTE — Progress Notes (Signed)
Nephrology progress note:   51M AV IE and perivalvular abscess, serrata sp.   AVR repair 5/12;  Anuric AKI from ATN-  Normal crt in Feb 2022 then 5 on 07/18/20 in the setting of IE  S:  had 1 liter UOP over 5/22.  One stool.  Last HD on 5/16 with 1 kg UF.   Discouraged about kidneys not getting better  Review of systems:  Still feels constipated Denies shortness of breath or chest pain Denies n/v  O: 05/22 0701 - 05/23 0700 In: 1300 [P.O.:1200; IV Piggyback:100] Out: 1001 [Urine:1000; Stool:1]   Vitals:   08/14/20 0400 08/14/20 0743  BP: 125/79 139/85  Pulse: 85 80  Resp: 16 20  Temp: 98 F (36.7 C) 98 F (36.7 C)  SpO2: 96% 96%   General adult male in bed in no acute distress HEENT normocephalic atraumatic extraocular movements intact sclera anicteric Neck supple trachea midline Lungs clear to auscultation bilaterally normal work of breathing at rest  Heart mechanical click; normal rate; no rub Abdomen soft nontender nondistended Extremities no pitting edema; legs wrapped  Psych normal mood and affect Neuro alert and oriented and conversant Access: RIJ tunneled catheter   Filed Weights   08/12/20 0340 08/13/20 0445 08/14/20 0400  Weight: 128.8 kg 129.9 kg 129.5 kg    Recent Labs  Lab 08/12/20 0054 08/13/20 0230 08/14/20 0227  NA 129* 128* 127*  K 4.0 4.5 4.7  CL 99 98 98  CO2 20* 20* 20*  GLUCOSE 83 94 92  BUN 61* 63* 67*  CREATININE 5.93* 5.78* 6.01*  CALCIUM 7.9* 8.1* 8.0*  PHOS 8.0* 8.1* 8.2*   Recent Labs  Lab 08/12/20 0054 08/13/20 0230 08/14/20 0227  WBC 9.5 10.2 10.0  HGB 8.4* 8.6* 8.8*  HCT 26.1* 26.0* 27.1*  MCV 88.5 86.4 86.3  PLT 261 293 319    Scheduled Meds: . (feeding supplement) PROSource Plus  30 mL Oral BID BM  . buprenorphine-naloxone  1 tablet Sublingual BID  . Chlorhexidine Gluconate Cloth  6 each Topical Daily  . collagenase   Topical Daily  . lactulose  30 g Oral BID  . lidocaine  1 patch Transdermal Q24H  . melatonin   3 mg Oral QHS  . methocarbamol  500 mg Oral TID  . nicotine  14 mg Transdermal Daily  . polyethylene glycol  17 g Oral Daily  . senna-docusate  1 tablet Oral BID  . warfarin  2 mg Oral ONCE-1600  . Warfarin - Pharmacist Dosing Inpatient   Does not apply q1600   Continuous Infusions: . ceFEPime (MAXIPIME) IV 2 g (08/13/20 2225)   PRN Meds:.acetaminophen **OR** acetaminophen, bisacodyl, clonazePAM, ondansetron **OR** ondansetron (ZOFRAN) IV  ABG    Component Value Date/Time   PHART 7.405 08/03/2020 2033   PCO2ART 31.4 (L) 08/03/2020 2033   PO2ART 81 (L) 08/03/2020 2033   HCO3 19.6 (L) 08/03/2020 2033   TCO2 21 (L) 08/03/2020 2033   ACIDBASEDEF 4.0 (H) 08/03/2020 2033   O2SAT 96.0 08/03/2020 2033    A/P  1. Severe AKI - likely secondary to ATN related to infection and surgery.  S/p CRRT then transitioned to hemodialysis on 5/16.   1. HD today.  Hopeful for overall improvement  2. Transition to renal diet 2. 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  Management per primary team 3. VDRF, resolved  4. Anemia normocytic - improving.  Got aranesp 150 mcg on 5/20.  Ordered for this week and every Friday for now  5. Hyponatremia: Likely related to volume excess.  For HD.  6. Hyperphosphatemia: for HD.  Start renvela. Transition to renal diet 7. Constipation: improved; please avoid mag citrate and fleet's enemas   Claudia Desanctis, MD 08/14/2020 11:02 AM

## 2020-08-14 NOTE — Progress Notes (Signed)
ANTICOAGULATION CONSULT NOTE - Follow Up Consult  Pharmacy Consult for Warfarin  Indication: mechanical aortic valve  No Known Allergies  Patient Measurements: Height: '6\' 4"'$  (193 cm) Weight: 129.5 kg (285 lb 9.6 oz) IBW/kg (Calculated) : 86.8  Vital Signs: Temp: 98 F (36.7 C) (05/23 0743) Temp Source: Oral (05/23 0743) BP: 139/85 (05/23 0743) Pulse Rate: 80 (05/23 0743)  Labs: Recent Labs    08/12/20 0054 08/13/20 0230 08/14/20 0227  HGB 8.4* 8.6* 8.8*  HCT 26.1* 26.0* 27.1*  PLT 261 293 319  LABPROT 29.9* 35.6* 35.7*  INR 2.8* 3.6* 3.6*  CREATININE 5.93* 5.78* 6.01*   - dialysis-dependent  Assessment: 39yom with recent endocarditis> AV root repair and Mechanical AVR on 08/03/20. Warfarin started 5/13. Left AMA 5/16 pm then returned 5/17 pm. INR 2.6 on 5/18 and Warfarin resumed.   INR today 3.6  Goal of Therapy:  INR goal 2.5-3.5 per Dr. Kipp Brood Monitor platelets by anticoagulation protocol: Yes    Plan:  Warfarin 2 mg po x 1 to prevent drop to less than goal Monitor daily INR, s/sx bleeding  Thank you Anette Guarneri, PharmD  Please refer to [facility] for unit-specific pharmacist

## 2020-08-14 NOTE — Progress Notes (Signed)
[facility] Progress Note  Patient: Alex [name]    [id number]  DOA: 08/08/2020     Date of Service: the patient was seen and examined on 08/14/2020  Brief hospital course: Past medical history of polysubstance abuse, aortic valve endocarditis with recent AVR, Serratia bacteremia, septic emboli causing stroke and acute kidney injury requiring HD.  Left the hospital AMA on 5/17 and came back the same night after discussion with cardiothoracic surgery.  4/26 transfer from [facility] to [facility]  CT head 4/26 >> no acute findings  CT abd/pelvis 4/26 >> cholelithiasis w/o inflammatory chagnes, mild hepatomegaly (25 cm), moderate splenomegaly (16.7 cm), 2 mm non obstructing kidney stone on Lt  4/27 CVL placed. Pressors escalating.   4/28 intubated for TEE, CVC, HD, and A-line placed. CRRT started   Remains on CRRT  MRI on 4/30-multiple infarcts  5/4: repeat TEE shows an aortic valve abscess  5/5: extubated , self-removed HD cath so CRRT holiday> developed severe hyperkalemia overnight.  5/6: new temp HD cath placed   5/8: new temp HD cath placed after prior one self-removed by patient  5/9: Orthopantogram neg for abscess  5/10: HD cath replaced by IR [facility]  5/12 to OR for AVR, aortic root reconstruction, closure of aortic sinus track. To 2H ICU on vent post op. 4 units PRBC given intraop.   5/13 extubated, on CRRT, remains on pressors  5/18, left AMA.  Currently plan is work on disposition.  Assessment and Plan: Aortic valve endocarditiswith perivalvular abscess, Serratia bacteremia Septic emboli causing strokes Presented with headache and fever.  Had hypotension, AKI thrombocytopenia on admission 4/26. Transferred to [facility] ICU.  Intubated. MRI brain 07/22/20 revealed multiple acute stroke Blood culture positive for Serratia bacteremia. Echocardiogram shows severe AI and AR with AV vegetation confirmed by TEE. -Status postmechanicalaortic valve replacement, aortic  root reconstruction and closure of aortic sinus track -Needs 6 weeks of cefepime per ID with start date being 5/13 -Continue Coumadin per pharmacy to keep INR 2.5-3.5 -Left AMA on 5/16.  Came back to the hospital after recommendation by cardiothoracic surgery on 5/17.  Acute kidney injury likely ATN from infection hyponatremia, hyperphosphatemia. Nephrology consult appreciated. Patient was on CRRT while he was in the ICU on 4/28. Following which patient was placed on HD with attempt [facility] catheter placement. With patient making urine currently nephrology is holding HD and monitoring. Patient would not like to continue renal diet.  Per my discussion with nephrology anticipating patient to have normal renal function and complete recovery therefore PICC line placement will be acceptable once HD catheter is not required.  Anemia/thrombocytopenia secondary to acute infection -Status post 6units of PRBC during his last hospital stay -Hemoglobin stable.  Transfuse as needed. -Platelets stable as well.  Amphetamine abuse (HCC) Nicotine dependence, cigarettes, uncomplicated Opioid dependence on agonist therapy  On chronic Suboxone. Continue in the hospital but Nicotine patch as well. Not a candidate for locked PICC given his recent substance use.  Hyperglycemia. Patient was on sliding scale insulin. Was on carb modified diet during last admission.   Hemoglobin A1c was 5.7. I do not think the patient requires carb modified diet.  Obesity. placing the patient at risk for poor outcome. Body mass index is 34.76 kg/m.   Bilateral lower extremity edema. Volume overload most likely in the setting of renal dysfunction rather than acute on chronic diastolic CHF in the setting of aortic regurgitation Will try Unna boots.  Monitor.  Constipation. Medication adjusted.  Resolved. Avoid magnesium  based medication as well as fleets enema.  Body mass index is 34.76 kg/m.   Malnutrition  Type: Nutrition Problem: Increased nutrient needs Etiology: post-op healing Nutrition Interventions: Interventions: Refer to RD note for recommendations  Pressure Injury 07/18/20 Coccyx Posterior;Medial Unstageable - Full thickness tissue loss in which the base of the injury is covered by slough (yellow, tan, gray, green or brown) and/or eschar (tan, brown or black) in the wound bed. (Active)  07/18/20 2200  Location: Coccyx  Location Orientation: Posterior;Medial  Staging: Unstageable - Full thickness tissue loss in which the base of the injury is covered by slough (yellow, tan, gray, green or brown) and/or eschar (tan, brown or black) in the wound bed.  Wound Description (Comments):   Present on Admission: Yes     Pressure Injury 07/18/20 Buttocks Right Unstageable - Full thickness tissue loss in which the base of the injury is covered by slough (yellow, tan, gray, green or brown) and/or eschar (tan, brown or black) in the wound bed. (Active)  07/18/20   Location: Buttocks  Location Orientation: Right  Staging: Unstageable - Full thickness tissue loss in which the base of the injury is covered by slough (yellow, tan, gray, green or brown) and/or eschar (tan, brown or black) in the wound bed.  Wound Description (Comments):   Present on Admission: Yes    Diet: Low-sodium diet. DVT Prophylaxis:   Place TED hose Start: 08/10/20 0757    Advance goals of care discussion: Full code  Family Communication: Mother at bedside.  Updated daily. The pt provided permission to discuss medical plan with the family. Opportunity was given to ask question and all questions were answered satisfactorily.   Disposition:  Status is: Inpatient  Remains inpatient appropriate because:Unsafe d/c plan  Dispo: The patient is from: Home              Anticipated d/c is to: Home              Patient currently is not medically stable to d/c.   Difficult to place patient No  Subjective: No nausea no  vomiting.  No fever no chills.  Fatigue and tiredness.  Physical Exam:  General: Appear in mild distress, no Rash; Oral Mucosa Clear, moist. no Abnormal Neck Mass Or lumps, Conjunctiva normal  Cardiovascular: S1 and S2 Present, no Murmur, Respiratory: Normal respiratory effort, Bilateral Air entry present and no crackles, no wheezes Abdomen: Bowel Sound present, Soft and no tenderness Extremities: trace Pedal edema Neurology: alert and oriented to time, place, and person affect appropriate. no new focal deficit Gait not checked due to patient safety concerns    Vitals:   08/14/20 0743 08/14/20 1244 08/14/20 1818 08/14/20 1949  BP: 139/85 118/73 130/84 134/90  Pulse: 80 92 91 90  Resp: '20 20 19 19  '$ Temp: 98 F (36.7 C) (!) 97.5 F (36.4 C) 97.7 F (36.5 C) 98.8 F (37.1 C)  TempSrc: Oral Oral Oral Oral  SpO2: 96% 97% 99% 100%  Weight:      Height:        Intake/Output Summary (Last 24 hours) at 08/14/2020 1955 Last data filed at 08/14/2020 1827 Gross per 24 hour  Intake 820 ml  Output 1250 ml  Net -430 ml   Filed Weights   08/12/20 0340 08/13/20 0445 08/14/20 0400  Weight: 128.8 kg 129.9 kg 129.5 kg    Data Reviewed: I have personally reviewed and interpreted daily labs, tele strips, imaging. I reviewed all nursing notes, pharmacy notes,  vitals, pertinent old records I have discussed plan of care as described above with RN and patient/family.  CBC: Recent Labs  Lab 08/10/20 0107 08/11/20 0126 08/12/20 0054 08/13/20 0230 08/14/20 0227  WBC 10.2 9.4 9.5 10.2 10.0  HGB 7.9* 7.7* 8.4* 8.6* 8.8*  HCT 25.2* 24.6* 26.1* 26.0* 27.1*  MCV 88.7 89.5 88.5 86.4 86.3  PLT 276 277 261 293 [id number]   Basic Metabolic Panel: Recent Labs  Lab 08/10/20 0107 08/11/20 0126 08/12/20 0054 08/13/20 0230 08/14/20 0227  NA 131* 130* 129* 128* 127*  K 4.2 4.2 4.0 4.5 4.7  CL 100 100 99 98 98  CO2 22 20* 20* 20* 20*  GLUCOSE 94 83 83 94 92  BUN 52* 56* 61* 63* 67*  CREATININE  5.46* 5.70* 5.93* 5.78* 6.01*  CALCIUM 7.9* 7.7* 7.9* 8.1* 8.0*  PHOS 6.3* 7.0* 8.0* 8.1* 8.2*    Studies: No results found.  Scheduled Meds: . (feeding supplement) PROSource Plus  30 mL Oral BID BM  . buprenorphine-naloxone  1 tablet Sublingual BID  . Chlorhexidine Gluconate Cloth  6 each Topical Daily  . Chlorhexidine Gluconate Cloth  6 each Topical Q0600  . collagenase   Topical Daily  . [START ON 08/18/2020] darbepoetin (ARANESP) injection - NON-DIALYSIS  150 mcg Subcutaneous Q Fri-1800  . lactulose  30 g Oral BID  . lidocaine  1 patch Transdermal Q24H  . melatonin  3 mg Oral QHS  . methocarbamol  500 mg Oral TID  . nicotine  14 mg Transdermal Daily  . polyethylene glycol  17 g Oral Daily  . senna-docusate  1 tablet Oral BID  . sevelamer carbonate  800 mg Oral TID WC  . Warfarin - Pharmacist Dosing Inpatient   Does not apply q1600   Continuous Infusions: . ceFEPime (MAXIPIME) IV 2 g (08/13/20 2225)   PRN Meds: acetaminophen **OR** acetaminophen, bisacodyl, clonazePAM, ondansetron **OR** ondansetron (ZOFRAN) IV  Time spent: 35 minutes  Author: Berle Mull, MD [facility] 08/14/2020 7:55 PM  To reach On-call, see care teams to locate the attending and reach out via [email or web address]. Between 7PM-7AM, please contact night-coverage If you still have difficulty reaching the attending provider, please page the [facility] (Director on Call) for [facility] on amion for assistance.

## 2020-08-14 NOTE — Progress Notes (Addendum)
Median sternotomy wound is clean, dry, well healed without sign of infection. INR 3.6. Patient with mechanical On X mechanical valve so INR should be up to 3.5 for first three months followed by 1.5 thereafter. Patient inquiring if needs HD;will defer to nephology et al.

## 2020-08-15 DIAGNOSIS — I339 Acute and subacute endocarditis, unspecified: Secondary | ICD-10-CM | POA: Diagnosis not present

## 2020-08-15 LAB — RENAL FUNCTION PANEL
Albumin: 1.9 g/dL — ABNORMAL LOW (ref 3.5–5.0)
Anion gap: 10 (ref 5–15)
BUN: 68 mg/dL — ABNORMAL HIGH (ref 6–20)
CO2: 21 mmol/L — ABNORMAL LOW (ref 22–32)
Calcium: 8.1 mg/dL — ABNORMAL LOW (ref 8.9–10.3)
Chloride: 100 mmol/L (ref 98–111)
Creatinine, Ser: 6.11 mg/dL — ABNORMAL HIGH (ref 0.61–1.24)
GFR, Estimated: 11 mL/min — ABNORMAL LOW (ref >60)
Glucose, Bld: 91 mg/dL (ref 70–99)
Phosphorus: 8.2 mg/dL — ABNORMAL HIGH (ref 2.5–4.6)
Potassium: 4.3 mmol/L (ref 3.5–5.1)
Sodium: 131 mmol/L — ABNORMAL LOW (ref 135–145)

## 2020-08-15 LAB — CBC
HCT: 26.1 % — ABNORMAL LOW (ref 39.0–52.0)
Hemoglobin: 8.4 g/dL — ABNORMAL LOW (ref 13.0–17.0)
MCH: 28.1 pg (ref 26.0–34.0)
MCHC: 32.2 g/dL (ref 30.0–36.0)
MCV: 87.3 fL (ref 80.0–100.0)
Platelets: 341 10*3/uL (ref 150–400)
RBC: 2.99 MIL/uL — ABNORMAL LOW (ref 4.22–5.81)
RDW: 15.5 % (ref 11.5–15.5)
WBC: 9.3 10*3/uL (ref 4.0–10.5)
nRBC: 0 % (ref 0.0–0.2)

## 2020-08-15 LAB — PROTIME-INR
INR: 2.7 — ABNORMAL HIGH (ref 0.8–1.2)
Prothrombin Time: 28.8 seconds — ABNORMAL HIGH (ref 11.4–15.2)

## 2020-08-15 MED ORDER — HEPARIN SODIUM (PORCINE) 1000 UNIT/ML IJ SOLN
INTRAMUSCULAR | Status: AC
  Filled 2020-08-15: qty 4

## 2020-08-15 MED ORDER — WARFARIN SODIUM 5 MG PO TABS
5.0000 mg | ORAL_TABLET | Freq: Once | ORAL | Status: AC
  Administered 2020-08-15: 5 mg via ORAL
  Filled 2020-08-15: qty 1

## 2020-08-15 NOTE — Progress Notes (Signed)
Nephrology progress note:   51M AV IE and perivalvular abscess, serrata sp.   AVR repair 5/12;  Anuric AKI from ATN-  Normal crt in Feb 2022 then 5 on 07/18/20 in the setting of IE  S:  Seen and examined on dialysis.  Blood pressure 128/80 and HR 79.  Tolerating goal.  RIJ tunn catheter in use.  Procedure supervised.  Had 2.7 liters UOP over 5/23.  tx delayed on 5/23 2/2 patient volumes and staffing.     Review of systems: Denies shortness of breath or chest pain Denies n/v  O: 05/23 0701 - 05/24 0700 In: 1200 [P.O.:1200] Out: 2650 [Urine:2650]   Vitals:   08/15/20 0745 08/15/20 0800  BP: 134/79 137/74  Pulse: 81 77  Resp:  13  Temp:    SpO2:  96%   General adult male in bed in no acute distress  HEENT normocephalic atraumatic extraocular movements intact sclera anicteric Neck supple trachea midline Lungs clear to auscultation bilaterally normal work of breathing at rest  Heart mechanical click; normal rate; no rub Abdomen soft nontender nondistended Extremities no pitting edema; legs wrapped  Psych normal mood and affect Neuro alert and oriented and conversant Access: RIJ tunneled catheter   Filed Weights   08/13/20 0445 08/14/20 0400 08/15/20 0720  Weight: 129.9 kg 129.5 kg 130.2 kg    Recent Labs  Lab 08/13/20 0230 08/14/20 0227 08/15/20 0414  NA 128* 127* 131*  K 4.5 4.7 4.3  CL 98 98 100  CO2 20* 20* 21*  GLUCOSE 94 92 91  BUN 63* 67* 68*  CREATININE 5.78* 6.01* 6.11*  CALCIUM 8.1* 8.0* 8.1*  PHOS 8.1* 8.2* 8.2*   Recent Labs  Lab 08/13/20 0230 08/14/20 0227 08/15/20 0414  WBC 10.2 10.0 9.3  HGB 8.6* 8.8* 8.4*  HCT 26.0* 27.1* 26.1*  MCV 86.4 86.3 87.3  PLT 293 319 341    Scheduled Meds: . (feeding supplement) PROSource Plus  30 mL Oral BID BM  . buprenorphine-naloxone  1 tablet Sublingual BID  . Chlorhexidine Gluconate Cloth  6 each Topical Daily  . Chlorhexidine Gluconate Cloth  6 each Topical Q0600  . collagenase   Topical Daily  .  [START ON 08/18/2020] darbepoetin (ARANESP) injection - NON-DIALYSIS  150 mcg Subcutaneous Q Fri-1800  . lactulose  30 g Oral BID  . lidocaine  1 patch Transdermal Q24H  . melatonin  3 mg Oral QHS  . methocarbamol  500 mg Oral TID  . nicotine  14 mg Transdermal Daily  . polyethylene glycol  17 g Oral Daily  . senna-docusate  1 tablet Oral BID  . sevelamer carbonate  800 mg Oral TID WC  . Warfarin - Pharmacist Dosing Inpatient   Does not apply q1600   Continuous Infusions: . ceFEPime (MAXIPIME) IV 2 g (08/14/20 2246)   PRN Meds:.acetaminophen **OR** acetaminophen, bisacodyl, clonazePAM, ondansetron **OR** ondansetron (ZOFRAN) IV  ABG    Component Value Date/Time   PHART 7.405 08/03/2020 2033   PCO2ART 31.4 (L) 08/03/2020 2033   PO2ART 81 (L) 08/03/2020 2033   HCO3 19.6 (L) 08/03/2020 2033   TCO2 21 (L) 08/03/2020 2033   ACIDBASEDEF 4.0 (H) 08/03/2020 2033   O2SAT 96.0 08/03/2020 2033    A/P  1. Severe AKI - likely secondary to ATN related to infection and surgery.  S/p CRRT then transitioned to hemodialysis on 5/16.   1. HD today, 5/24.  Hopeful for overall improvement and I have shared this with him 2. Renal diet  2. 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  Management per primary team.  Pharmacy following for cefepime dosing 3. VDRF, resolved  4. Anemia normocytic - improving.  Got aranesp 150 mcg on 5/20.  Ordered for this week and every Friday for now  5. Hyponatremia: Likely related to volume excess.  For HD.  6. Hyperphosphatemia: for HD.  Started renvela 5/24. renal diet 7. Constipation: improved; please avoid mag citrate and fleet's enemas   Claudia Desanctis, MD 08/15/2020 8:50 AM

## 2020-08-15 NOTE — TOC Progression Note (Signed)
Transition of Care [facility]) - Progression Note    Patient Details  Name: Alex [name] MRN: [id number] Date of Birth: February 21, 1981  Transition of Care [facility] CM/SW [place], [place] Phone Number: 08/15/2020, 2:54 PM  Clinical Narrative:     CSW met with pt for substance use consult. Pt's mother is bedside but pt consents to her remaining in room for consult. A CSW provided pt with resources on 08/04/20 to pt but a new consult was put in on 5/18 that specified resources for inpatient tx and syringe exchange. CSW met with pt to make sure pt had all resources he needs. Pt states he has been clean since 2016 or 2017. He has been on suboxone since then and sees a provider in [place]. Pt states that this incident was the result of a relapse he had and that he feels secure with his current treatment with suboxone. Pt is open to additional resources which CSW provides (syringe exchange, inpatient, and outpatient resources). Pt expresses no other needs.        Expected Discharge Plan and Services                                                 Social Determinants of Health (SDOH) Interventions    Readmission Risk Interventions No flowsheet data found.

## 2020-08-15 NOTE — Progress Notes (Signed)
ANTICOAGULATION CONSULT NOTE - Follow Up Consult  Pharmacy Consult for Warfarin  Indication: mechanical aortic valve  No Known Allergies  Patient Measurements: Height: '6\' 4"'$  (193 cm) Weight: 130.2 kg (287 lb 0.6 oz) IBW/kg (Calculated) : 86.8  Vital Signs: Temp: 98.4 F (36.9 C) (05/24 1206) Temp Source: Oral (05/24 1206) BP: 135/84 (05/24 1206) Pulse Rate: 85 (05/24 1206)  Labs: Recent Labs    08/13/20 0230 08/14/20 0227 08/15/20 0414  HGB 8.6* 8.8* 8.4*  HCT 26.0* 27.1* 26.1*  PLT 293 319 341  LABPROT 35.6* 35.7* 28.8*  INR 3.6* 3.6* 2.7*  CREATININE 5.78* 6.01* 6.11*   - dialysis-dependent  Assessment: 39yom with recent endocarditis> AV root repair and Mechanical AVR on 08/03/20. Warfarin started 5/13. Left AMA 5/16 pm then returned 5/17 pm. INR 2.6 on 5/18 and Warfarin resumed.   INR today 2.7   Goal of Therapy:  INR goal 2.5-3.5 per Dr. Kipp Brood Monitor platelets by anticoagulation protocol: Yes    Plan:  Warfarin 5 mg po x 1  Monitor daily INR, s/sx bleeding  Thank you Anette Guarneri, PharmD  Please refer to [facility] for unit-specific pharmacist

## 2020-08-15 NOTE — Progress Notes (Signed)
[facility] Progress Note  Patient: Alex [name]    [id number]  DOA: 08/08/2020     Date of Service: the patient was seen and examined on 08/15/2020  Brief hospital course: Past medical history of polysubstance abuse, aortic valve endocarditis with recent AVR, Serratia bacteremia, septic emboli causing stroke and acute kidney injury requiring HD.  Left the hospital AMA on 5/17 and came back the same night after discussion with cardiothoracic surgery.  4/26 transfer from [facility] to [facility]  CT head 4/26 >> no acute findings  CT abd/pelvis 4/26 >> cholelithiasis w/o inflammatory chagnes, mild hepatomegaly (25 cm), moderate splenomegaly (16.7 cm), 2 mm non obstructing kidney stone on Lt  4/27 CVL placed. Pressors escalating.   4/28 intubated for TEE, CVC, HD, and A-line placed. CRRT started   Remains on CRRT  MRI on 4/30-multiple infarcts  5/4: repeat TEE shows an aortic valve abscess  5/5: extubated , self-removed HD cath so CRRT holiday> developed severe hyperkalemia overnight.  5/6: new temp HD cath placed   5/8: new temp HD cath placed after prior one self-removed by patient  5/9: Orthopantogram neg for abscess  5/10: HD cath replaced by IR [facility]  5/12 to OR for AVR, aortic root reconstruction, closure of aortic sinus track. To 2H ICU on vent post op. 4 units PRBC given intraop.   5/13 extubated, on CRRT, remains on pressors  5/18, left AMA.  Currently plan is monitor for improvement in renal function and work on disposition.  Assessment and Plan: Aortic valve endocarditiswith perivalvular abscess, Serratia bacteremia Septic emboli causing strokes Presented with headache and fever.  Had hypotension, AKI thrombocytopenia on admission 4/26. Transferred to [facility] ICU.  Intubated. MRI brain 07/22/20 revealed multiple acute stroke Blood culture positive for Serratia bacteremia. Echocardiogram shows severe AI and AR with AV vegetation confirmed by TEE. -Status  postmechanicalaortic valve replacement, aortic root reconstruction and closure of aortic sinus track -Needs 6 weeks of cefepime per ID with start date being 5/13 -Continue Coumadin per pharmacy to keep INR 2.5-3.5 -Left AMA on 5/16.  Came back to the hospital after recommendation by cardiothoracic surgery on 5/17.  Acute kidney injury likely ATN from infection hyponatremia, hyperphosphatemia. Nephrology consult appreciated. Patient was on CRRT while he was in the ICU on 4/28. Following which patient was placed on HD with attempt [facility] catheter placement. With patient making urine currently nephrology is holding HD and monitoring. Patient would not like to continue renal diet.  Per my discussion with nephrology anticipating patient to have normal renal function and complete recovery therefore PICC line placement will be acceptable once HD catheter is not required. Patient unable to sleep on his stomach with [facility] catheter placed.  Anemia/thrombocytopenia secondary to acute infection Status post 6units of PRBC during his last hospital stay Hemoglobin stable.  Transfuse as needed. Platelets stable as well.  Amphetamine abuse (HCC) Nicotine dependence, cigarettes, uncomplicated Opioid dependence on agonist therapy  On chronic Suboxone. Continue in the hospital but Nicotine patch as well. Not a candidate for locked PICC given his recent substance use.  Hyperglycemia. Patient was on sliding scale insulin. Was on carb modified diet during last admission.   Hemoglobin A1c was 5.7. I do not think the patient requires carb modified diet.  Obesity. placing the patient at risk for poor outcome. Body mass index is 34.94 kg/m.   Bilateral lower extremity edema. Volume overload most likely in the setting of renal dysfunction rather than acute on chronic diastolic CHF in the  setting of aortic regurgitation Will try Unna boots.  Monitor.  Constipation. Medication adjusted.   Resolved. Avoid magnesium based medication as well as fleets enema.  Body mass index is 34.94 kg/m.   Malnutrition Type: Nutrition Problem: Increased nutrient needs Etiology: post-op healing Nutrition Interventions: Interventions: Refer to RD note for recommendations  Pressure Injury 07/18/20 Coccyx Posterior;Medial Unstageable - Full thickness tissue loss in which the base of the injury is covered by slough (yellow, tan, gray, green or brown) and/or eschar (tan, brown or black) in the wound bed. (Active)  07/18/20 2200  Location: Coccyx  Location Orientation: Posterior;Medial  Staging: Unstageable - Full thickness tissue loss in which the base of the injury is covered by slough (yellow, tan, gray, green or brown) and/or eschar (tan, brown or black) in the wound bed.  Wound Description (Comments):   Present on Admission: Yes     Pressure Injury 07/18/20 Buttocks Right Unstageable - Full thickness tissue loss in which the base of the injury is covered by slough (yellow, tan, gray, green or brown) and/or eschar (tan, brown or black) in the wound bed. (Active)  07/18/20   Location: Buttocks  Location Orientation: Right  Staging: Unstageable - Full thickness tissue loss in which the base of the injury is covered by slough (yellow, tan, gray, green or brown) and/or eschar (tan, brown or black) in the wound bed.  Wound Description (Comments):   Present on Admission: Yes   Diet: Low-sodium diet. DVT Prophylaxis:   Warfarin for therapeutic anticoagulation. Place TED hose Start: 08/10/20 0757  Advance goals of care discussion: Full code  Family Communication: Seen at HD.  No family at bedside  Disposition:  Status is: Inpatient  Remains inpatient appropriate because:Unsafe d/c plan  Dispo: The patient is from: Home              Anticipated d/c is to: Home              Patient currently is not medically stable to d/c.   Difficult to place patient No  Subjective: Seen at HD.  No  nausea no vomiting no fever no chills.  No acute complaint. Physical Exam:  General: Appear in mild distress, no Rash; Oral Mucosa Clear, moist. no Abnormal Neck Mass Or lumps, Conjunctiva normal  Cardiovascular: S1 and S2 Present, no Murmur, Respiratory: Normal respiratory effort, Bilateral Air entry present and no crackles, no wheezes Abdomen: Bowel Sound present, Soft and no tenderness Extremities: trace Pedal edema Neurology: alert and oriented to time, place, and person affect appropriate. no new focal deficit Gait not checked due to patient safety concerns    Vitals:   08/15/20 1100 08/15/20 1125 08/15/20 1206 08/15/20 1507  BP: 139/77 131/74 135/84 134/84  Pulse: 70 80 85 96  Resp: '12 20 19 14  '$ Temp:  98.6 F (37 C) 98.4 F (36.9 C) 98 F (36.7 C)  TempSrc:  Oral Oral Oral  SpO2: 97% 98% 99% 100%  Weight:  130.2 kg    Height:        Intake/Output Summary (Last 24 hours) at 08/15/2020 1949 Last data filed at 08/15/2020 1125 Gross per 24 hour  Intake 480 ml  Output 2275 ml  Net -1795 ml   Filed Weights   08/14/20 0400 08/15/20 0720 08/15/20 1125  Weight: 129.5 kg 130.2 kg 130.2 kg    Data Reviewed: I have personally reviewed and interpreted daily labs, tele strips, imaging. I reviewed all nursing notes, pharmacy notes, vitals, pertinent old records  I have discussed plan of care as described above with RN and patient/family.  CBC: Recent Labs  Lab 08/11/20 0126 08/12/20 0054 08/13/20 0230 08/14/20 0227 08/15/20 0414  WBC 9.4 9.5 10.2 10.0 9.3  HGB 7.7* 8.4* 8.6* 8.8* 8.4*  HCT 24.6* 26.1* 26.0* 27.1* 26.1*  MCV 89.5 88.5 86.4 86.3 87.3  PLT 277 261 293 319 [id number]   Basic Metabolic Panel: Recent Labs  Lab 08/11/20 0126 08/12/20 0054 08/13/20 0230 08/14/20 0227 08/15/20 0414  NA 130* 129* 128* 127* 131*  K 4.2 4.0 4.5 4.7 4.3  CL 100 99 98 98 100  CO2 20* 20* 20* 20* 21*  GLUCOSE 83 83 94 92 91  BUN 56* 61* 63* 67* 68*  CREATININE 5.70* 5.93* 5.78*  6.01* 6.11*  CALCIUM 7.7* 7.9* 8.1* 8.0* 8.1*  PHOS 7.0* 8.0* 8.1* 8.2* 8.2*    Studies: No results found.  Scheduled Meds: . (feeding supplement) PROSource Plus  30 mL Oral BID BM  . buprenorphine-naloxone  1 tablet Sublingual BID  . Chlorhexidine Gluconate Cloth  6 each Topical Daily  . Chlorhexidine Gluconate Cloth  6 each Topical Q0600  . collagenase   Topical Daily  . [START ON 08/18/2020] darbepoetin (ARANESP) injection - NON-DIALYSIS  150 mcg Subcutaneous Q Fri-1800  . heparin sodium (porcine)      . lactulose  30 g Oral BID  . lidocaine  1 patch Transdermal Q24H  . melatonin  3 mg Oral QHS  . methocarbamol  500 mg Oral TID  . nicotine  14 mg Transdermal Daily  . polyethylene glycol  17 g Oral Daily  . senna-docusate  1 tablet Oral BID  . sevelamer carbonate  800 mg Oral TID WC  . Warfarin - Pharmacist Dosing Inpatient   Does not apply q1600   Continuous Infusions: . ceFEPime (MAXIPIME) IV 2 g (08/14/20 2246)   PRN Meds: acetaminophen **OR** acetaminophen, bisacodyl, clonazePAM, ondansetron **OR** ondansetron (ZOFRAN) IV  Time spent: 35 minutes  Author: Berle Mull, MD [facility] 08/15/2020 7:49 PM  To reach On-call, see care teams to locate the attending and reach out via [email or web address]. Between 7PM-7AM, please contact night-coverage If you still have difficulty reaching the attending provider, please page the [facility] (Director on Call) for [facility] on amion for assistance.

## 2020-08-16 DIAGNOSIS — R34 Anuria and oliguria: Secondary | ICD-10-CM

## 2020-08-16 DIAGNOSIS — I38 Endocarditis, valve unspecified: Secondary | ICD-10-CM | POA: Diagnosis not present

## 2020-08-16 DIAGNOSIS — G8929 Other chronic pain: Secondary | ICD-10-CM

## 2020-08-16 DIAGNOSIS — N179 Acute kidney failure, unspecified: Secondary | ICD-10-CM

## 2020-08-16 DIAGNOSIS — M549 Dorsalgia, unspecified: Secondary | ICD-10-CM | POA: Diagnosis not present

## 2020-08-16 DIAGNOSIS — I330 Acute and subacute infective endocarditis: Principal | ICD-10-CM

## 2020-08-16 DIAGNOSIS — W19XXXA Unspecified fall, initial encounter: Secondary | ICD-10-CM

## 2020-08-16 DIAGNOSIS — F1190 Opioid use, unspecified, uncomplicated: Secondary | ICD-10-CM

## 2020-08-16 LAB — RENAL FUNCTION PANEL
Albumin: 1.9 g/dL — ABNORMAL LOW (ref 3.5–5.0)
Anion gap: 6 (ref 5–15)
BUN: 33 mg/dL — ABNORMAL HIGH (ref 6–20)
CO2: 26 mmol/L (ref 22–32)
Calcium: 7.8 mg/dL — ABNORMAL LOW (ref 8.9–10.3)
Chloride: 100 mmol/L (ref 98–111)
Creatinine, Ser: 4.1 mg/dL — ABNORMAL HIGH (ref 0.61–1.24)
GFR, Estimated: 18 mL/min — ABNORMAL LOW (ref >60)
Glucose, Bld: 99 mg/dL (ref 70–99)
Phosphorus: 5.3 mg/dL — ABNORMAL HIGH (ref 2.5–4.6)
Potassium: 3.9 mmol/L (ref 3.5–5.1)
Sodium: 132 mmol/L — ABNORMAL LOW (ref 135–145)

## 2020-08-16 LAB — IRON AND TIBC
Iron: 16 ug/dL — ABNORMAL LOW (ref 45–182)
Saturation Ratios: 7 % — ABNORMAL LOW (ref 17.9–39.5)
TIBC: 237 ug/dL — ABNORMAL LOW (ref 250–450)
UIBC: 221 ug/dL

## 2020-08-16 LAB — CBC
HCT: 25.7 % — ABNORMAL LOW (ref 39.0–52.0)
Hemoglobin: 8.3 g/dL — ABNORMAL LOW (ref 13.0–17.0)
MCH: 28.2 pg (ref 26.0–34.0)
MCHC: 32.3 g/dL (ref 30.0–36.0)
MCV: 87.4 fL (ref 80.0–100.0)
Platelets: 333 10*3/uL (ref 150–400)
RBC: 2.94 MIL/uL — ABNORMAL LOW (ref 4.22–5.81)
RDW: 15.5 % (ref 11.5–15.5)
WBC: 9.4 10*3/uL (ref 4.0–10.5)
nRBC: 0 % (ref 0.0–0.2)

## 2020-08-16 LAB — PROTIME-INR
INR: 2.5 — ABNORMAL HIGH (ref 0.8–1.2)
Prothrombin Time: 26.7 seconds — ABNORMAL HIGH (ref 11.4–15.2)

## 2020-08-16 LAB — CBC WITH DIFFERENTIAL/PLATELET
Abs Immature Granulocytes: 0.03 10*3/uL (ref 0.00–0.07)
Basophils Absolute: 0 10*3/uL (ref 0.0–0.1)
Basophils Relative: 0 %
Eosinophils Absolute: 0.1 10*3/uL (ref 0.0–0.5)
Eosinophils Relative: 1 %
HCT: 26.1 % — ABNORMAL LOW (ref 39.0–52.0)
Hemoglobin: 8.1 g/dL — ABNORMAL LOW (ref 13.0–17.0)
Immature Granulocytes: 0 %
Lymphocytes Relative: 16 %
Lymphs Abs: 1.3 10*3/uL (ref 0.7–4.0)
MCH: 27.6 pg (ref 26.0–34.0)
MCHC: 31 g/dL (ref 30.0–36.0)
MCV: 88.8 fL (ref 80.0–100.0)
Monocytes Absolute: 1 10*3/uL (ref 0.1–1.0)
Monocytes Relative: 12 %
Neutro Abs: 5.9 10*3/uL (ref 1.7–7.7)
Neutrophils Relative %: 71 %
Platelets: 324 10*3/uL (ref 150–400)
RBC: 2.94 MIL/uL — ABNORMAL LOW (ref 4.22–5.81)
RDW: 15.7 % — ABNORMAL HIGH (ref 11.5–15.5)
WBC: 8.3 10*3/uL (ref 4.0–10.5)
nRBC: 0 % (ref 0.0–0.2)

## 2020-08-16 LAB — VITAMIN B12: Vitamin B-12: 334 pg/mL (ref 180–914)

## 2020-08-16 LAB — RETICULOCYTES
Immature Retic Fract: 22.2 % — ABNORMAL HIGH (ref 2.3–15.9)
RBC.: 3.05 MIL/uL — ABNORMAL LOW (ref 4.22–5.81)
Retic Count, Absolute: 67.1 10*3/uL (ref 19.0–186.0)
Retic Ct Pct: 2.2 % (ref 0.4–3.1)

## 2020-08-16 LAB — FOLATE: Folate: 6.9 ng/mL (ref >5.9)

## 2020-08-16 LAB — FERRITIN: Ferritin: 305 ng/mL (ref 24–336)

## 2020-08-16 MED ORDER — WARFARIN SODIUM 2.5 MG PO TABS
2.5000 mg | ORAL_TABLET | Freq: Once | ORAL | Status: AC
  Administered 2020-08-16: 2.5 mg via ORAL
  Filled 2020-08-16: qty 1

## 2020-08-16 MED ORDER — COVID-19 MRNA VAC-TRIS(PFIZER) 30 MCG/0.3ML IM SUSP
0.3000 mL | Freq: Once | INTRAMUSCULAR | Status: AC
  Administered 2020-08-16: 0.3 mL via INTRAMUSCULAR
  Filled 2020-08-16: qty 0.3

## 2020-08-16 NOTE — Progress Notes (Signed)
Initial Nutrition Assessment  DOCUMENTATION CODES:   Not applicable  INTERVENTION:    Continue 30 ml ProSource Plus BID, each supplement provides 100 kcals and 15 grams protein.   Continue Renal MVI   NUTRITION DIAGNOSIS:   Increased nutrient needs related to post-op healing as evidenced by estimated needs.  Ongoing  GOAL:   Patient will meet greater than or equal to 90% of their needs   Meeting   MONITOR:   PO intake,Supplement acceptance,Weight trends,Labs,I & O's  REASON FOR ASSESSMENT:   Rounds    ASSESSMENT:   Patient with PMH significant for polysubstance use, ruptured lumbar disc, and recent admission for aortic valve endocarditis with perivalvular abscess (see time line below). Left AMA and presents back to ED with AKI and back/knee pain from mechanical fall.   4/28 - intubated, TEE, CRRT initiated 4/29 - Cortrak placed (gastric tip) 5/04 - TEE 5/05 - extubated to BiPAP, CRRT discontinued (pt pulled dialysis catheter) 5/06 - CRRT restarted due to severe hyperkalemia 5/10 - s/p tunneled HD catheter placement 5/12 - s/p AVR, aortic root reconstruction, closure of aortic sinus track 5/16 - transition to iHD 5/17 - left AMA  Had iHD yesterday. No plans for HD today. Hopeful for renal recovery. Diet transitioned to renal diet. Intake remains adequate with last four meal completions documented as 100%. Taking ProSource BID. Patient had many questions regarding food items he could have with the diet change. Discussed foods that are high and low in sodium, phosphorus, and potassium. Provided meal ideas and encouraged protein intake.    Admission weight: 131.5 kg  Current weight: 128.6 kg   Medications: aranesp, lactulose, miralax, senokot, renvela Labs: Na 132 (L) Cr 4.10 BUN 33- both trending down Phosphorus 5.3 (H)   Diet Order:   Diet Order            Diet renal with fluid restriction Room service appropriate? Yes; Fluid consistency: Thin  Diet effective  now                 EDUCATION NEEDS:   Education needs have been addressed  Skin:  Skin Integrity Issues:: Other (Comment),Unstageable,Incisions Unstageable: coccyx, R buttocks Incisions: chest Other: MASD- perineum  Last BM:  5/23  Height:   Ht Readings from Last 1 Encounters:  08/08/20 '6\' 4"'$  (1.93 m)    Weight:   Wt Readings from Last 1 Encounters:  08/16/20 128.6 kg    BMI:  Body mass index is 34.51 kg/m.  Estimated Nutritional Needs:   Kcal:  2500-2700 kcal  Protein:  120-140 grams  Fluid:  1000 ml + UOP  Mariana Single RD, LDN Clinical Nutrition Pager listed in [place]

## 2020-08-16 NOTE — Progress Notes (Signed)
Nephrology progress note:   41M AV IE and perivalvular abscess, serrata sp.   AVR repair 5/12;  Anuric AKI from ATN-  Normal crt in Feb 2022 then 5 on 07/18/20 in the setting of IE  S:  Last HD on 5/24 with 0.5 kg UF.  Had 1.1 liters UOP over 5/24.  He hopes renal function improves - wants to know what he can do to help kidneys and I let him know may just take time.  States HD went ok.  Updated his mother at bedside.      Review of systems:  Denies shortness of breath or chest pain Denies n/v  O: 05/24 0701 - 05/25 0700 In: 680 [P.O.:480; IV Piggyback:200] Out: 1605 [Urine:1105]   Vitals:   08/16/20 0436 08/16/20 0752  BP: 124/77 130/86  Pulse: 81 85  Resp: 16 10  Temp: 98.9 F (37.2 C) 98.1 F (36.7 C)  SpO2: 96% 96%   General adult male in bed in no acute distress   HEENT normocephalic atraumatic extraocular movements intact sclera anicteric Neck supple trachea midline Lungs clear to auscultation bilaterally normal work of breathing at rest  Heart mechanical click; normal rate; no rub Abdomen soft nontender nondistended Extremities trace pitting edema; legs wrapped  Psych normal mood and affect Neuro alert and oriented and conversant Access: RIJ tunneled catheter   Filed Weights   08/15/20 0720 08/15/20 1125 08/16/20 0436  Weight: 130.2 kg 130.2 kg 128.6 kg    Recent Labs  Lab 08/14/20 0227 08/15/20 0414 08/16/20 0142  NA 127* 131* 132*  K 4.7 4.3 3.9  CL 98 100 100  CO2 20* 21* 26  GLUCOSE 92 91 99  BUN 67* 68* 33*  CREATININE 6.01* 6.11* 4.10*  CALCIUM 8.0* 8.1* 7.8*  PHOS 8.2* 8.2* 5.3*   Recent Labs  Lab 08/15/20 0414 08/16/20 0142 08/16/20 0941  WBC 9.3 9.4 8.3  NEUTROABS  --   --  5.9  HGB 8.4* 8.3* 8.1*  HCT 26.1* 25.7* 26.1*  MCV 87.3 87.4 88.8  PLT 341 333 324    Scheduled Meds: . (feeding supplement) PROSource Plus  30 mL Oral BID BM  . buprenorphine-naloxone  1 tablet Sublingual BID  . Chlorhexidine Gluconate Cloth  6 each Topical  Daily  . Chlorhexidine Gluconate Cloth  6 each Topical Q0600  . collagenase   Topical Daily  . [START ON 08/18/2020] darbepoetin (ARANESP) injection - NON-DIALYSIS  150 mcg Subcutaneous Q Fri-1800  . lactulose  30 g Oral BID  . lidocaine  1 patch Transdermal Q24H  . melatonin  3 mg Oral QHS  . methocarbamol  500 mg Oral TID  . nicotine  14 mg Transdermal Daily  . polyethylene glycol  17 g Oral Daily  . senna-docusate  1 tablet Oral BID  . sevelamer carbonate  800 mg Oral TID WC  . warfarin  2.5 mg Oral ONCE-1600  . Warfarin - Pharmacist Dosing Inpatient   Does not apply q1600   Continuous Infusions: . ceFEPime (MAXIPIME) IV Stopped (08/15/20 2205)   PRN Meds:.acetaminophen **OR** acetaminophen, bisacodyl, clonazePAM, ondansetron **OR** ondansetron (ZOFRAN) IV  ABG    Component Value Date/Time   PHART 7.405 08/03/2020 2033   PCO2ART 31.4 (L) 08/03/2020 2033   PO2ART 81 (L) 08/03/2020 2033   HCO3 19.6 (L) 08/03/2020 2033   TCO2 21 (L) 08/03/2020 2033   ACIDBASEDEF 4.0 (H) 08/03/2020 2033   O2SAT 96.0 08/03/2020 2033    A/P  1. Severe AKI - likely  secondary to ATN related to infection and surgery.  S/p CRRT then transitioned to hemodialysis on 5/16.   1. No acute need for HD today. Labs in am are in place.  Hopeful for overall improvement and I have shared this with him 2. 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  Management per primary team.  Pharmacy following for cefepime dosing 3. VDRF, resolved  4. Anemia normocytic - Got aranesp 150 mcg on 5/20.  Ordered for every Friday for now. Defer IV iron with bactermia  5. Hyponatremia: Likely related to volume excess.  Improved with HD.  6. Hyperphosphatemia: improved with HD.  Started renvela 5/24. renal diet 7. Constipation: improved; please avoid mag citrate and fleet's enemas   Claudia Desanctis, MD 08/16/2020 12:00 PM

## 2020-08-16 NOTE — Progress Notes (Signed)
PROGRESS NOTE    Alex [name]  [id number] DOB: 1980/12/27 DOA: 08/08/2020 PCP: [facility]     Brief Narrative:  40 year old WM PMHx  polysubstance abuse, aortic valve endocarditis with recent AVR, Serratia bacteremia, septic emboli causing stroke and acute kidney injury requiring HD.  Left the hospital AMA on 5/17 and came back the same night after discussion with cardiothoracic surgery.  4/26 transfer from [facility] to [facility]  CT head 4/26 >> no acute findings  CT abd/pelvis 4/26 >> cholelithiasis w/o inflammatory chagnes, mild hepatomegaly (25 cm), moderate splenomegaly (16.7 cm), 2 mm non obstructing kidney stone on Lt  4/27 CVL placed. Pressors escalating.   4/28 intubated for TEE, CVC, HD, and A-line placed. CRRT started   Remains on CRRT  MRI on 4/30-multiple infarcts  5/4: repeat TEE shows an aortic valve abscess  5/5: extubated , self-removed HD cath so CRRT holiday> developed severe hyperkalemia overnight.  5/6: new temp HD cath placed   5/8: new temp HD cath placed after prior one self-removed by patient  5/9: Orthopantogram neg for abscess  5/10: HD cath replaced by IR [facility]  5/12 to OR for AVR, aortic root reconstruction, closure of aortic sinus track. To 2H ICU on vent post op. 4 units PRBC given intraop.   5/13 extubated, on CRRT, remains on pressors  5/18, left AMA.  Currently plan is monitor for improvement in renal function and work on disposition.   Subjective: Afebrile overnight, A/O x4 sitting on the edge of the bed eating lunch speaking with his mother.  Only complaint is muscle spasms in the middle of his back which are not fully controlled with Lidoderm patch.   Assessment & Plan: Covid vaccination; negative vaccination.  Request to be vaccinated.  Vaccination order placed   Principal Problem:   Endocarditis Active Problems:   Opioid use disorder   Acute renal failure with oliguria (HCC)   Cerebral  embolism with cerebral infarction   Anemia  Aortic valve endocarditiswith perivalvular abscess, Serratia bacteremia Septic emboli causing strokes Presented with headache and fever.  Had hypotension, AKI thrombocytopenia on admission 4/26. Transferred to [facility] ICU.  Intubated. MRI brain 07/22/20 revealed multiple acute stroke Blood culture positive for Serratia bacteremia. Echocardiogram shows severe AI and AR with AV vegetation confirmed by TEE. -Status postmechanicalaortic valve replacement, aortic root reconstruction and closure of aortic sinus track -Needs 6 weeks of cefepime per ID with start date being 5/13 -Continue Coumadin per pharmacy to keep INR 2.5-3.5 -Left AMA on 5/16.  Came back to the hospital after recommendation by cardiothoracic surgery on 5/17.  Acute kidney injury likely ATN from infection hyponatremia, hyperphosphatemia. Nephrology consult appreciated. Patient was on CRRT while he was in the ICU on 4/28. Following which patient was placed on HD with attempt [facility] catheter placement. With patient making urine currently nephrology is holding HD and monitoring. Patient would not like to continue renal diet.  Per my discussion with nephrology anticipating patient to have normal renal function and complete recovery therefore PICC line placement will be acceptable once HD catheter is not required. Patient unable to sleep on his stomach with [facility] catheter placed.  Anemia/thrombocytopenia secondary to acute infection Status post 6units of PRBC during his last hospital stay Hemoglobin stable.  Transfuse as needed. Platelets stable as well. - Anemia panel pending  Amphetamine abuse (HCC) Nicotine dependence, cigarettes, uncomplicated Opioid dependence on agonist therapy  On chronic Suboxone. Continue in the hospital but Nicotine patch as well. Not  a candidate for locked PICC given his recent substance use.  Hyperglycemia. Patient was on sliding scale  insulin. Was on carb modified diet during last admission.   Hemoglobin A1c was 5.7. I do not think the patient requires carb modified diet.  Obesity. placing the patient at risk for poor outcome. Body mass index is 34.94 kg/m.   Bilateral lower extremity edema. Volume overload most likely in the setting of renal dysfunction rather than acute on chronic diastolic CHF in the setting of aortic regurgitation Will try Unna boots.  Monitor.  Constipation. Medication adjusted.  Resolved. Avoid magnesium based medication as well as fleets enema.  Body mass index is 34.94 kg/m.   Malnutrition Type: Nutrition Problem: Increased nutrient needs Etiology: post-op healing Nutrition Interventions: Interventions: Refer to RD note for recommendations  Decubitus ulcer Pressure Injury 07/18/20 Coccyx Posterior;Medial Unstageable - Full thickness tissue loss in which the base of the injury is covered by slough (yellow, tan, gray, green or brown) and/or eschar (tan, brown or black) in the wound bed. (Active)  07/18/20 2200  Location: Coccyx  Location Orientation: Posterior;Medial  Staging: Unstageable - Full thickness tissue loss in which the base of the injury is covered by slough (yellow, tan, gray, green or brown) and/or eschar (tan, brown or black) in the wound bed.  Wound Description (Comments):   Present on Admission: Yes     Pressure Injury 07/18/20 Buttocks Right Unstageable - Full thickness tissue loss in which the base of the injury is covered by slough (yellow, tan, gray, green or brown) and/or eschar (tan, brown or black) in the wound bed. (Active)  07/18/20   Location: Buttocks  Location Orientation: Right  Staging: Unstageable - Full thickness tissue loss in which the base of the injury is covered by slough (yellow, tan, gray, green or brown) and/or eschar (tan, brown or black) in the wound bed.  Wound Description (Comments):   Present on Admission: Yes    Muscle spasm  middle back - 5/25 consult to physical therapy; range of motion/stretching exercises to alleviate muscle spasm in the lower left trapezius area -Continue Lidoderm patch q 12 hrs -Continue Robaxin 500 mg TID       Body mass index is 34.51 kg/m.     DVT prophylaxis: Warfarin Code Status: Full Family Communication: Mother at bedside for discussion of plan of care answered all questions Status is: Inpatient    Dispo: The patient is from: Home              Anticipated d/c is to: Will remain in hospital to complete 6-week course of IV antibiotics              Anticipated d/c date is:               Patient currently unstable      Consultants:  ID  Procedures/Significant Events:    I have personally reviewed and interpreted all radiology studies and my findings are as above.  VENTILATOR SETTINGS:    Cultures   Antimicrobials:    Devices    LINES / TUBES:      Continuous Infusions: . ceFEPime (MAXIPIME) IV Stopped (08/15/20 2205)     Objective: Vitals:   08/16/20 0036 08/16/20 0236 08/16/20 0436 08/16/20 0752  BP:   124/77 130/86  Pulse:   81 85  Resp: '12 12 16 10  '$ Temp:   98.9 F (37.2 C) 98.1 F (36.7 C)  TempSrc:   Oral Oral  SpO2:  96% 96%  Weight:   128.6 kg   Height:        Intake/Output Summary (Last 24 hours) at 08/16/2020 0841 Last data filed at 08/16/2020 0518 Gross per 24 hour  Intake 680 ml  Output 1230 ml  Net -550 ml   Filed Weights   08/15/20 0720 08/15/20 1125 08/16/20 0436  Weight: 130.2 kg 130.2 kg 128.6 kg    Examination:  General: A/O x4, No acute respiratory distress Eyes: negative scleral hemorrhage, negative anisocoria, negative icterus ENT: Negative Runny nose, negative gingival bleeding, Neck:  Negative scars, masses, torticollis, lymphadenopathy, JVD Lungs: Clear to auscultation bilaterally without wheezes or crackles Cardiovascular: Regular rate and rhythm without murmur gallop or rub normal S1 and  S2 Abdomen: negative abdominal pain, nondistended, positive soft, bowel sounds, no rebound, no ascites, no appreciable mass Extremities: No significant cyanosis, clubbing, or edema bilateral lower extremities Skin: Negative rashes, lesions, ulcers Psychiatric:  Negative depression, negative anxiety, negative fatigue, negative mania  Central nervous system:  Cranial nerves II through XII intact, tongue/uvula midline, all extremities muscle strength 5/5, sensation intact throughout,  negative dysarthria, negative expressive aphasia, negative receptive aphasia.  .     Data Reviewed: Care during the described time interval was provided by me .  I have reviewed this patient's available data, including medical history, events of note, physical examination, and all test results as part of my evaluation.  CBC: Recent Labs  Lab 08/12/20 0054 08/13/20 0230 08/14/20 0227 08/15/20 0414 08/16/20 0142  WBC 9.5 10.2 10.0 9.3 9.4  HGB 8.4* 8.6* 8.8* 8.4* 8.3*  HCT 26.1* 26.0* 27.1* 26.1* 25.7*  MCV 88.5 86.4 86.3 87.3 87.4  PLT 261 293 319 341 0000000   Basic Metabolic Panel: Recent Labs  Lab 08/12/20 0054 08/13/20 0230 08/14/20 0227 08/15/20 0414 08/16/20 0142  NA 129* 128* 127* 131* 132*  K 4.0 4.5 4.7 4.3 3.9  CL 99 98 98 100 100  CO2 20* 20* 20* 21* 26  GLUCOSE 83 94 92 91 99  BUN 61* 63* 67* 68* 33*  CREATININE 5.93* 5.78* 6.01* 6.11* 4.10*  CALCIUM 7.9* 8.1* 8.0* 8.1* 7.8*  PHOS 8.0* 8.1* 8.2* 8.2* 5.3*   GFR: Estimated Creatinine Clearance: 36.1 mL/min (A) (by C-G formula based on SCr of 4.1 mg/dL (H)). Liver Function Tests: Recent Labs  Lab 08/12/20 0054 08/13/20 0230 08/14/20 0227 08/15/20 0414 08/16/20 0142  ALBUMIN 1.8* 1.9* 1.9* 1.9* 1.9*   No results for input(s): LIPASE, AMYLASE in the last 168 hours. No results for input(s): AMMONIA in the last 168 hours. Coagulation Profile: Recent Labs  Lab 08/12/20 0054 08/13/20 0230 08/14/20 0227 08/15/20 0414  08/16/20 0142  INR 2.8* 3.6* 3.6* 2.7* 2.5*   Cardiac Enzymes: No results for input(s): CKTOTAL, CKMB, CKMBINDEX, TROPONINI in the last 168 hours. BNP (last 3 results) No results for input(s): PROBNP in the last 8760 hours. HbA1C: No results for input(s): HGBA1C in the last 72 hours. CBG: No results for input(s): GLUCAP in the last 168 hours. Lipid Profile: No results for input(s): CHOL, HDL, LDLCALC, TRIG, CHOLHDL, LDLDIRECT in the last 72 hours. Thyroid Function Tests: No results for input(s): TSH, T4TOTAL, FREET4, T3FREE, THYROIDAB in the last 72 hours. Anemia Panel: No results for input(s): VITAMINB12, FOLATE, FERRITIN, TIBC, IRON, RETICCTPCT in the last 72 hours. Sepsis Labs: No results for input(s): PROCALCITON, LATICACIDVEN in the last 168 hours.  Recent Results (from the past 240 hour(s))  Resp Panel by RT-PCR (Flu A&B, Covid) Nasopharyngeal Swab  Status: None   Collection Time: 08/08/20  9:10 PM   Specimen: Nasopharyngeal Swab; Nasopharyngeal(NP) swabs in vial transport medium  Result Value Ref Range Status   SARS Coronavirus 2 by RT PCR NEGATIVE NEGATIVE Final    Comment: (NOTE) SARS-CoV-2 target nucleic acids are NOT DETECTED.  The SARS-CoV-2 RNA is generally detectable in upper respiratory specimens during the acute phase of infection. The lowest concentration of SARS-CoV-2 viral copies this assay can detect is 138 copies/mL. A negative result does not preclude SARS-Cov-2 infection and should not be used as the sole basis for treatment or other patient management decisions. A negative result may occur with  improper specimen collection/handling, submission of specimen other than nasopharyngeal swab, presence of viral mutation(s) within the areas targeted by this assay, and inadequate number of viral copies(<138 copies/mL). A negative result must be combined with clinical observations, patient history, and epidemiological information. The expected result is  Negative.  Fact Sheet for Patients:  EntrepreneurPulse.com.au  Fact Sheet for Healthcare Providers:  IncredibleEmployment.be  This test is no t yet approved or cleared by the [place] FDA and  has been authorized for detection and/or diagnosis of SARS-CoV-2 by FDA under an Emergency Use Authorization (EUA). This EUA will remain  in effect (meaning this test can be used) for the duration of the COVID-19 declaration under Section 564(b)(1) of the Act, 21 U.S.C.section 360bbb-3(b)(1), unless the authorization is terminated  or revoked sooner.       Influenza A by PCR NEGATIVE NEGATIVE Final   Influenza B by PCR NEGATIVE NEGATIVE Final    Comment: (NOTE) The Xpert Xpress SARS-CoV-2/FLU/RSV plus assay is intended as an aid in the diagnosis of influenza from Nasopharyngeal swab specimens and should not be used as a sole basis for treatment. Nasal washings and aspirates are unacceptable for Xpert Xpress SARS-CoV-2/FLU/RSV testing.  Fact Sheet for Patients: EntrepreneurPulse.com.au  Fact Sheet for Healthcare Providers: IncredibleEmployment.be  This test is not yet approved or cleared by the [place] FDA and has been authorized for detection and/or diagnosis of SARS-CoV-2 by FDA under an Emergency Use Authorization (EUA). This EUA will remain in effect (meaning this test can be used) for the duration of the COVID-19 declaration under Section 564(b)(1) of the Act, 21 U.S.C. section 360bbb-3(b)(1), unless the authorization is terminated or revoked.  Performed at [facility], [place] [address] [place], [place] [address]          Radiology Studies: No results found.      Scheduled Meds: . (feeding supplement) PROSource Plus  30 mL Oral BID BM  . buprenorphine-naloxone  1 tablet Sublingual BID  . Chlorhexidine Gluconate Cloth  6 each Topical Daily  . Chlorhexidine Gluconate Cloth  6  each Topical Q0600  . collagenase   Topical Daily  . [START ON 08/18/2020] darbepoetin (ARANESP) injection - NON-DIALYSIS  150 mcg Subcutaneous Q Fri-1800  . lactulose  30 g Oral BID  . lidocaine  1 patch Transdermal Q24H  . melatonin  3 mg Oral QHS  . methocarbamol  500 mg Oral TID  . nicotine  14 mg Transdermal Daily  . polyethylene glycol  17 g Oral Daily  . senna-docusate  1 tablet Oral BID  . sevelamer carbonate  800 mg Oral TID WC  . warfarin  2.5 mg Oral ONCE-1600  . Warfarin - Pharmacist Dosing Inpatient   Does not apply q1600   Continuous Infusions: . ceFEPime (MAXIPIME) IV Stopped (08/15/20 2205)     LOS: 8  days    Time spent:40 min    Antione Obar, Geraldo Docker, MD [facility]   If 7PM-7AM, please contact night-coverage 08/16/2020, 8:41 AM

## 2020-08-16 NOTE — Progress Notes (Signed)
ANTICOAGULATION CONSULT NOTE - Follow Up Consult  Pharmacy Consult for Warfarin Indication: mechanical aortic valve  No Known Allergies  Patient Measurements: Height: '6\' 4"'$  (193 cm) Weight: 128.6 kg (283 lb 8 oz) IBW/kg (Calculated) : 86.8   Vital Signs: Temp: 98.1 F (36.7 C) (05/25 0752) Temp Source: Oral (05/25 0752) BP: 130/86 (05/25 0752) Pulse Rate: 85 (05/25 0752)  Labs: Recent Labs    08/14/20 0227 08/15/20 0414 08/16/20 0142  HGB 8.8* 8.4* 8.3*  HCT 27.1* 26.1* 25.7*  PLT 319 341 333  LABPROT 35.7* 28.8* 26.7*  INR 3.6* 2.7* 2.5*  CREATININE 6.01* 6.11* 4.10*    Estimated Creatinine Clearance: 36.1 mL/min (A) (by C-G formula based on SCr of 4.1 mg/dL (H)).  Assessment: 39yom with recent endocarditis> AV root repair and Mechanical AVR on 08/03/20. Warfarin started 5/13. Left AMA 5/16 pm then returned 5/17 pm. INR 2.6 on 5/18 and Warfarin resumed.   Warfarin '5mg'$  PO given yesterday - INR today 2.5 INR trending down however imagine the '5mg'$  will show its impact on INR tomorrow/day after that.   Goal of Therapy:  INR 2.5-3.5 per Dr. Kipp Brood Monitor platelets by anticoagulation protocol: Yes   Plan:  Will give 2.'5mg'$  today to prevent any large uptrend in INR.  Monitor INR closely, s/sx of bleeding  Joetta Manners, PharmD, [facility] Clinical Pharmacist  Please check AMION for all [place] phone numbers After 10:00 PM, call [place] [phone]

## 2020-08-17 DIAGNOSIS — N179 Acute kidney failure, unspecified: Secondary | ICD-10-CM | POA: Diagnosis not present

## 2020-08-17 DIAGNOSIS — I38 Endocarditis, valve unspecified: Secondary | ICD-10-CM | POA: Diagnosis not present

## 2020-08-17 DIAGNOSIS — R34 Anuria and oliguria: Secondary | ICD-10-CM | POA: Diagnosis not present

## 2020-08-17 DIAGNOSIS — I6340 Cerebral infarction due to embolism of unspecified cerebral artery: Secondary | ICD-10-CM

## 2020-08-17 LAB — PROTIME-INR
INR: 2.1 — ABNORMAL HIGH (ref 0.8–1.2)
Prothrombin Time: 23.4 seconds — ABNORMAL HIGH (ref 11.4–15.2)

## 2020-08-17 LAB — COMPREHENSIVE METABOLIC PANEL
ALT: 7 U/L (ref 0–44)
AST: 13 U/L — ABNORMAL LOW (ref 15–41)
Albumin: 1.9 g/dL — ABNORMAL LOW (ref 3.5–5.0)
Alkaline Phosphatase: 52 U/L (ref 38–126)
Anion gap: 9 (ref 5–15)
BUN: 40 mg/dL — ABNORMAL HIGH (ref 6–20)
CO2: 24 mmol/L (ref 22–32)
Calcium: 7.9 mg/dL — ABNORMAL LOW (ref 8.9–10.3)
Chloride: 99 mmol/L (ref 98–111)
Creatinine, Ser: 4.96 mg/dL — ABNORMAL HIGH (ref 0.61–1.24)
GFR, Estimated: 15 mL/min — ABNORMAL LOW (ref >60)
Glucose, Bld: 92 mg/dL (ref 70–99)
Potassium: 3.8 mmol/L (ref 3.5–5.1)
Sodium: 132 mmol/L — ABNORMAL LOW (ref 135–145)
Total Bilirubin: 1 mg/dL (ref 0.3–1.2)
Total Protein: 5.8 g/dL — ABNORMAL LOW (ref 6.5–8.1)

## 2020-08-17 LAB — CBC
HCT: 24.2 % — ABNORMAL LOW (ref 39.0–52.0)
Hemoglobin: 7.5 g/dL — ABNORMAL LOW (ref 13.0–17.0)
MCH: 27.7 pg (ref 26.0–34.0)
MCHC: 31 g/dL (ref 30.0–36.0)
MCV: 89.3 fL (ref 80.0–100.0)
Platelets: 314 10*3/uL (ref 150–400)
RBC: 2.71 MIL/uL — ABNORMAL LOW (ref 4.22–5.81)
RDW: 15.7 % — ABNORMAL HIGH (ref 11.5–15.5)
WBC: 8.3 10*3/uL (ref 4.0–10.5)
nRBC: 0 % (ref 0.0–0.2)

## 2020-08-17 LAB — PHOSPHORUS: Phosphorus: 6.4 mg/dL — ABNORMAL HIGH (ref 2.5–4.6)

## 2020-08-17 LAB — MAGNESIUM: Magnesium: 2.2 mg/dL (ref 1.7–2.4)

## 2020-08-17 MED ORDER — WARFARIN SODIUM 5 MG PO TABS
5.0000 mg | ORAL_TABLET | Freq: Once | ORAL | Status: AC
  Administered 2020-08-17: 5 mg via ORAL
  Filled 2020-08-17: qty 1

## 2020-08-17 MED ORDER — SODIUM CHLORIDE 0.9 % IV SOLN: INTRAVENOUS | Status: DC

## 2020-08-17 NOTE — Progress Notes (Signed)
ANTICOAGULATION CONSULT NOTE - Follow Up Consult  Pharmacy Consult for Warfarin Indication: mechanical aortic valve  No Known Allergies  Patient Measurements: Height: '6\' 4"'$  (193 cm) Weight: 128.6 kg (283 lb 8 oz) IBW/kg (Calculated) : 86.8   Vital Signs: Temp: 98.9 F (37.2 C) (05/26 0734) Temp Source: Oral (05/26 0734) BP: 108/85 (05/26 0735) Pulse Rate: 84 (05/26 0053)  Labs: Recent Labs    08/15/20 0414 08/16/20 0142 08/16/20 0941 08/17/20 0234  HGB 8.4* 8.3* 8.1* 7.5*  HCT 26.1* 25.7* 26.1* 24.2*  PLT 341 333 324 314  LABPROT 28.8* 26.7*  --  23.4*  INR 2.7* 2.5*  --  2.1*  CREATININE 6.11* 4.10*  --  4.96*    Estimated Creatinine Clearance: 29.9 mL/min (A) (by C-G formula based on SCr of 4.96 mg/dL (H)).  Assessment: 39yom with recent endocarditis> AV root repair and Mechanical AVR on 08/03/20. Warfarin started 5/13. Left AMA 5/16 pm then returned 5/17 pm. INR 2.6 on 5/18 and Warfarin resumed.    INR today 2.1 (drop from 2.5)  Goal of Therapy:  INR 2.5-3.5 per Dr. Kipp Brood Monitor platelets by anticoagulation protocol: Yes   Plan:  Will give 5 mg po x 1 today Monitor INR closely, s/sx of bleeding  Thank you Anette Guarneri, PharmD  Please check AMION for all [place] phone numbers After 10:00 PM, call [place] [phone]

## 2020-08-17 NOTE — Progress Notes (Signed)
[facility]  PROGRESS NOTE  Alex [name] [id number] DOB: Mar 09, 1981 DOA: 08/08/2020 PCP: [facility]   Brief HPI:   40 year old male with history of polysubstance abuse, aortic valve endocarditis, Serratia bacteremia, septic emboli causing stroke and acute kidney injury requiring hemodialysis.  Patient left hospital AMA on 08/08/2020.  He came back same night after discussion with CT surgery.   4/26 transfer from [facility] to [facility]  CT head 4/26 >> no acute findings  CT abd/pelvis 4/26 >> cholelithiasis w/o inflammatory chagnes, mild hepatomegaly (25 cm), moderate splenomegaly (16.7 cm), 2 mm non obstructing kidney stone on Lt  4/27 CVL placed. Pressors escalating.   4/28 intubated for TEE, CVC, HD, and A-line placed. CRRT started   Remains on CRRT  MRI on 4/30-multiple infarcts  5/4: repeat TEE shows an aortic valve abscess  5/5: extubated , self-removed HD cath so CRRT holiday> developed severe hyperkalemia overnight.  5/6: new temp HD cath placed   5/8: new temp HD cath placed after prior one self-removed by patient  5/9: Orthopantogram neg for abscess  5/10: HD cath replaced by IR [facility]  5/12 to OR for AVR, aortic root reconstruction, closure of aortic sinus track. To 2H ICU on vent post op. 4 units PRBC given intraop.   5/13 extubated, on CRRT, remains on pressors  5/18, left AMA.   Subjective   Patient seen and examined, denies any complaints.   Assessment/Plan:     1. Aortic valve endocarditis with perivalvular abscess-Serratia bacteremia, septic emboli causing strokes.  Patient presented with headaches and fever, developed hypotension, AKI, thrombocytopenia on admission on 4/26.  He was transferred to ICU, was intubated.  MRI brain on 07/22/2020 revealed multiple acute infarcts.  Blood cultures positive for Serratia bacteremia.  Echocardiogram showed severe AI and AR with AV vegetation confirmed by TEE.  He is s/p  mechanical aortic valve replacement, aortic root construction and closure of aortic sinus tract.  Patient needs 6 weeks of cefepime per ID with start date from 08/04/2020.  Continue Coumadin per pharmacy.  Keep INR between 2.5-3.5.  Patient left AMA on 08/07/2020 however came back to hospital after recommendation by CT surgery on 08/08/2020. 2. Acute kidney injury-likely from ATN from infection, hyponatremia, hypophosphatemia.  Nephrology was consulted.  Patient was initially placed on CRRT while he was in the ICU on 4/28.  Following which patient was placed on HD with attempted [facility] catheter placement.  Patient received dialysis day before yesterday.  Today creatinine is stable at 4.95.  Nephrology following. 3. Anemia/thrombocytopenia-patient is s/p 6 units PRBC during last hospital stay.  Hemoglobin is stable.  Transfuse PRBC as needed for hemoglobin less than 7.  Hemoglobin is stable at 7.5. 4. Polysubstance abuse-patient has amphetamine abuse, nicotine dependence, opiate dependence on agonist therapy.  He is on chronic Suboxone. 5. Bilateral lower extremity edema-likely from renal dysfunction as as well as chronic diastolic CHF.  Started on [employer]. 6. BMI-34.14kg/m   Scheduled medications:   . (feeding supplement) PROSource Plus  30 mL Oral BID BM  . buprenorphine-naloxone  1 tablet Sublingual BID  . Chlorhexidine Gluconate Cloth  6 each Topical Daily  . Chlorhexidine Gluconate Cloth  6 each Topical Q0600  . collagenase   Topical Daily  . [START ON 08/18/2020] darbepoetin (ARANESP) injection - NON-DIALYSIS  150 mcg Subcutaneous Q Fri-1800  . lactulose  30 g Oral BID  . lidocaine  1 patch Transdermal Q24H  . melatonin  3 mg Oral QHS  .  methocarbamol  500 mg Oral TID  . nicotine  14 mg Transdermal Daily  . polyethylene glycol  17 g Oral Daily  . senna-docusate  1 tablet Oral BID  . sevelamer carbonate  800 mg Oral TID WC  . warfarin  5 mg Oral ONCE-1600  . Warfarin - Pharmacist Dosing  Inpatient   Does not apply q1600         Data Reviewed:   CBG:  No results for input(s): GLUCAP in the last 168 hours.  SpO2: 98 %    Vitals:   08/17/20 0735 08/17/20 0736 08/17/20 1210 08/17/20 1727  BP: 108/85  136/88 136/82  Pulse:   93 84  Resp: '15  18 20  '$ Temp:   (!) 97.1 F (36.2 C) 98.5 F (36.9 C)  TempSrc:   Oral Oral  SpO2: 95% 95% 98% 98%  Weight:      Height:         Intake/Output Summary (Last 24 hours) at 08/17/2020 1808 Last data filed at 08/17/2020 1725 Gross per 24 hour  Intake --  Output 3100 ml  Net -3100 ml    05/24 1901 - 05/26 0700 In: 1880 [P.O.:1680] Out: 2530 [Urine:2530]  Filed Weights   08/15/20 0720 08/15/20 1125 08/16/20 0436  Weight: 130.2 kg 130.2 kg 128.6 kg    CBC:  Recent Labs  Lab 08/14/20 0227 08/15/20 0414 08/16/20 0142 08/16/20 0941 08/17/20 0234  WBC 10.0 9.3 9.4 8.3 8.3  HGB 8.8* 8.4* 8.3* 8.1* 7.5*  HCT 27.1* 26.1* 25.7* 26.1* 24.2*  PLT 319 341 333 324 314  MCV 86.3 87.3 87.4 88.8 89.3  MCH 28.0 28.1 28.2 27.6 27.7  MCHC 32.5 32.2 32.3 31.0 31.0  RDW 15.7* 15.5 15.5 15.7* 15.7*  LYMPHSABS  --   --   --  1.3  --   MONOABS  --   --   --  1.0  --   EOSABS  --   --   --  0.1  --   BASOSABS  --   --   --  0.0  --     Complete metabolic panel:  Recent Labs  Lab 08/13/20 0230 08/14/20 0227 08/15/20 0414 08/16/20 0142 08/17/20 0234  NA 128* 127* 131* 132* 132*  K 4.5 4.7 4.3 3.9 3.8  CL 98 98 100 100 99  CO2 20* 20* 21* 26 24  GLUCOSE 94 92 91 99 92  BUN 63* 67* 68* 33* 40*  CREATININE 5.78* 6.01* 6.11* 4.10* 4.96*  CALCIUM 8.1* 8.0* 8.1* 7.8* 7.9*  AST  --   --   --   --  13*  ALT  --   --   --   --  7  ALKPHOS  --   --   --   --  52  BILITOT  --   --   --   --  1.0  ALBUMIN 1.9* 1.9* 1.9* 1.9* 1.9*  MG  --   --   --   --  2.2  INR 3.6* 3.6* 2.7* 2.5* 2.1*    No results for input(s): LIPASE, AMYLASE in the last 168 hours.  No results for input(s): CRP, DDIMER, BNP, PROCALCITON,  SARSCOV2NAA in the last 168 hours.  Invalid input(s): LACTICACID  ------------------------------------------------------------------------------------------------------------------ No results for input(s): CHOL, HDL, LDLCALC, TRIG, CHOLHDL, LDLDIRECT in the last 72 hours.  Lab Results  Component Value Date   HGBA1C 5.7 (H) 07/25/2020   ------------------------------------------------------------------------------------------------------------------ No results for input(s): TSH, T4TOTAL,  T3FREE, THYROIDAB in the last 72 hours.  Invalid input(s): FREET3 ------------------------------------------------------------------------------------------------------------------ Recent Labs    08/16/20 0941  VITAMINB12 334  FOLATE 6.9  FERRITIN 305  TIBC 237*  IRON 16*  RETICCTPCT 2.2    Coagulation profile Recent Labs  Lab 08/13/20 0230 08/14/20 0227 08/15/20 0414 08/16/20 0142 08/17/20 0234  INR 3.6* 3.6* 2.7* 2.5* 2.1*   No results for input(s): DDIMER in the last 72 hours.  Cardiac Enzymes No results for input(s): CKTOTAL, CKMB, CKMBINDEX, TROPONINI in the last 168 hours.  ------------------------------------------------------------------------------------------------------------------    Component Value Date/Time   BNP 76.0 07/26/2020 0331     Antibiotics: Anti-infectives (From admission, onward)   Start     Dose/Rate Route Frequency Ordered Stop   08/13/20 2200  ceFEPIme (MAXIPIME) 2 g in sodium chloride 0.9 % 100 mL IVPB        2 g 200 mL/hr over 30 Minutes Intravenous Every 24 hours 08/13/20 1154     08/09/20 2200  ceFEPIme (MAXIPIME) 1 g in sodium chloride 0.9 % 100 mL IVPB  Status:  Discontinued        1 g 200 mL/hr over 30 Minutes Intravenous Every 24 hours 08/09/20 1238 08/13/20 1154   08/09/20 0000  ceFEPIme (MAXIPIME) 1 g in sodium chloride 0.9 % 100 mL IVPB  Status:  Discontinued        1 g 200 mL/hr over 30 Minutes Intravenous Every 24 hours 08/08/20 2305  08/09/20 1238       Radiology Reports  No results found.    DVT prophylaxis: Warfarin  Code Status: Full code  Family Communication: Discussed with patient's mother at bedside   Consultants:  Infectious disease  Procedures:      Objective    Physical Examination:    General-appears in no acute distress  Heart-S1-S2, regular, no murmur auscultated  Lungs-clear to auscultation bilaterally, no wheezing or crackles auscultated  Abdomen-soft, nontender, no organomegaly  Extremities-no edema in the lower extremities  Neuro-alert, oriented x3, no focal deficit noted   Status is: Inpatient  Dispo: The patient is from: Home              Anticipated d/c is to: Remain in hospital to complete 6 weeks of IV antibiotics              Anticipated d/c date is: To be decided              Patient currently not stable for discharge  Barrier to discharge-getting IV antibiotics in the hospital  COVID-19 Labs  Recent Labs    08/16/20 0941  FERRITIN 305    Lab Results  Component Value Date   [place] 08/08/2020   [place] NEGATIVE 07/18/2020    Microbiology  Recent Results (from the past 240 hour(s))  Resp Panel by RT-PCR (Flu A&B, Covid) Nasopharyngeal Swab     Status: None   Collection Time: 08/08/20  9:10 PM   Specimen: Nasopharyngeal Swab; Nasopharyngeal(NP) swabs in vial transport medium  Result Value Ref Range Status   SARS Coronavirus 2 by RT PCR NEGATIVE NEGATIVE Final    Comment: (NOTE) SARS-CoV-2 target nucleic acids are NOT DETECTED.  The SARS-CoV-2 RNA is generally detectable in upper respiratory specimens during the acute phase of infection. The lowest concentration of SARS-CoV-2 viral copies this assay can detect is 138 copies/mL. A negative result does not preclude SARS-Cov-2 infection and should not be used as the sole basis for treatment or other patient management decisions. A negative  result may occur with  improper  specimen collection/handling, submission of specimen other than nasopharyngeal swab, presence of viral mutation(s) within the areas targeted by this assay, and inadequate number of viral copies(<138 copies/mL). A negative result must be combined with clinical observations, patient history, and epidemiological information. The expected result is Negative.  Fact Sheet for Patients:  EntrepreneurPulse.com.au  Fact Sheet for Healthcare Providers:  IncredibleEmployment.be  This test is no t yet approved or cleared by the [place] FDA and  has been authorized for detection and/or diagnosis of SARS-CoV-2 by FDA under an Emergency Use Authorization (EUA). This EUA will remain  in effect (meaning this test can be used) for the duration of the COVID-19 declaration under Section 564(b)(1) of the Act, 21 U.S.C.section 360bbb-3(b)(1), unless the authorization is terminated  or revoked sooner.       Influenza A by PCR NEGATIVE NEGATIVE Final   Influenza B by PCR NEGATIVE NEGATIVE Final    Comment: (NOTE) The Xpert Xpress SARS-CoV-2/FLU/RSV plus assay is intended as an aid in the diagnosis of influenza from Nasopharyngeal swab specimens and should not be used as a sole basis for treatment. Nasal washings and aspirates are unacceptable for Xpert Xpress SARS-CoV-2/FLU/RSV testing.  Fact Sheet for Patients: EntrepreneurPulse.com.au  Fact Sheet for Healthcare Providers: IncredibleEmployment.be  This test is not yet approved or cleared by the [place] FDA and has been authorized for detection and/or diagnosis of SARS-CoV-2 by FDA under an Emergency Use Authorization (EUA). This EUA will remain in effect (meaning this test can be used) for the duration of the COVID-19 declaration under Section 564(b)(1) of the Act, 21 U.S.C. section 360bbb-3(b)(1), unless the authorization is terminated or revoked.  Performed at  [facility], [place] [address], [place], [place] [address]     Pressure Injury 07/18/20 Coccyx Posterior;Medial Unstageable - Full thickness tissue loss in which the base of the injury is covered by slough (yellow, tan, gray, green or brown) and/or eschar (tan, brown or black) in the wound bed. (Active)  07/18/20 2200  Location: Coccyx  Location Orientation: Posterior;Medial  Staging: Unstageable - Full thickness tissue loss in which the base of the injury is covered by slough (yellow, tan, gray, green or brown) and/or eschar (tan, brown or black) in the wound bed.  Wound Description (Comments):   Present on Admission: Yes     Pressure Injury 07/18/20 Buttocks Right Unstageable - Full thickness tissue loss in which the base of the injury is covered by slough (yellow, tan, gray, green or brown) and/or eschar (tan, brown or black) in the wound bed. (Active)  07/18/20   Location: Buttocks  Location Orientation: Right  Staging: Unstageable - Full thickness tissue loss in which the base of the injury is covered by slough (yellow, tan, gray, green or brown) and/or eschar (tan, brown or black) in the wound bed.  Wound Description (Comments):   Present on Admission: Yes          [place]   [facility] If 7PM-7AM, please contact night-coverage at [email or web address], Office  [phone]   08/17/2020, 6:09 PM  LOS: 9 days

## 2020-08-17 NOTE — Progress Notes (Signed)
Nephrology progress note:   65M AV IE and perivalvular abscess, serrata sp.   AVR repair 5/12;  Anuric AKI from ATN-  Normal crt in Feb 2022 then 5 on 07/18/20 in the setting of IE  S:  Last HD on 5/24 with 0.5 kg UF.  Had 1.8 liters UOP over 5/25 and has had 2 liters over 5/26 charted thus far as well.  Feels good today.  Reading and doing sudoku in his room.  States he was told he'd be here several weeks for IV abx.     Review of systems:   Denies shortness of breath or chest pain Denies n/v  O: 05/25 0701 - 05/26 0700 In: 1200 [P.O.:1200] Out: 1800 [Urine:1800]   Vitals:   08/17/20 0735 08/17/20 0736  BP: 108/85   Pulse:    Resp: 15   Temp:    SpO2: 95% 95%   General adult male in bed in no acute distress    HEENT normocephalic atraumatic extraocular movements intact sclera anicteric Neck supple trachea midline Lungs clear to auscultation bilaterally normal work of breathing at rest  Heart mechanical click; normal rate; no rub Abdomen soft nontender nondistended Extremities trace edema; legs wrapped  Psych normal mood and affect Neuro alert and oriented and conversant Access: RIJ tunneled catheter   Filed Weights   08/15/20 0720 08/15/20 1125 08/16/20 0436  Weight: 130.2 kg 130.2 kg 128.6 kg    Recent Labs  Lab 08/15/20 0414 08/16/20 0142 08/17/20 0234  NA 131* 132* 132*  K 4.3 3.9 3.8  CL 100 100 99  CO2 21* 26 24  GLUCOSE 91 99 92  BUN 68* 33* 40*  CREATININE 6.11* 4.10* 4.96*  CALCIUM 8.1* 7.8* 7.9*  PHOS 8.2* 5.3* 6.4*   Recent Labs  Lab 08/16/20 0142 08/16/20 0941 08/17/20 0234  WBC 9.4 8.3 8.3  NEUTROABS  --  5.9  --   HGB 8.3* 8.1* 7.5*  HCT 25.7* 26.1* 24.2*  MCV 87.4 88.8 89.3  PLT 333 324 314    Scheduled Meds: . (feeding supplement) PROSource Plus  30 mL Oral BID BM  . buprenorphine-naloxone  1 tablet Sublingual BID  . Chlorhexidine Gluconate Cloth  6 each Topical Daily  . Chlorhexidine Gluconate Cloth  6 each Topical Q0600  .  collagenase   Topical Daily  . [START ON 08/18/2020] darbepoetin (ARANESP) injection - NON-DIALYSIS  150 mcg Subcutaneous Q Fri-1800  . lactulose  30 g Oral BID  . lidocaine  1 patch Transdermal Q24H  . melatonin  3 mg Oral QHS  . methocarbamol  500 mg Oral TID  . nicotine  14 mg Transdermal Daily  . polyethylene glycol  17 g Oral Daily  . senna-docusate  1 tablet Oral BID  . sevelamer carbonate  800 mg Oral TID WC  . warfarin  5 mg Oral ONCE-1600  . Warfarin - Pharmacist Dosing Inpatient   Does not apply q1600   Continuous Infusions: . ceFEPime (MAXIPIME) IV 2 g (08/16/20 2214)   PRN Meds:.acetaminophen **OR** acetaminophen, bisacodyl, clonazePAM, ondansetron **OR** ondansetron (ZOFRAN) IV  ABG    Component Value Date/Time   PHART 7.405 08/03/2020 2033   PCO2ART 31.4 (L) 08/03/2020 2033   PO2ART 81 (L) 08/03/2020 2033   HCO3 19.6 (L) 08/03/2020 2033   TCO2 21 (L) 08/03/2020 2033   ACIDBASEDEF 4.0 (H) 08/03/2020 2033   O2SAT 96.0 08/03/2020 2033    A/P  1. Severe AKI - likely secondary to ATN related to infection and  surgery.  S/p CRRT then transitioned to hemodialysis on 5/16.   1. No acute need for HD today.  Continue to monitor.   2. NS at 75/hr x 24 hours 3. Depending on labs anticipate HD on 5/27 or 5/28 then monitor for recovery 2. 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  Management per primary team.  Pharmacy following for cefepime dosing 3. VDRF, resolved  4. Anemia normocytic - Aranesp 150 mcg every Friday for now. Defer IV iron with bactermia  5. Hyponatremia: Likely related to volume excess.  Improved with HD.  6. Hyperphosphatemia: improved with HD.  Started renvela 5/24. renal diet 7. Constipation: improved; please avoid mag citrate and fleet's enemas   Claudia Desanctis, MD 08/17/2020 9:28 AM

## 2020-08-17 NOTE — Evaluation (Addendum)
Physical Therapy Evaluation Patient Details Name: Alex [name] MRN: [id number] DOB: [birth year] Today's Date: 08/17/2020   History of Present Illness  40 yo male smoker brought to [facility] ER 07/18/20 with altered mental status. Found to have hypotension, AKI, thrombocytopenia, splenomegaly, lactic acidosis, elevated bilirubin, hypoglycemia, polysubstance abuse. Treated for sepsis Intubated 4/28-5/5. 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 5/10 to IR for tunneled HD cath.  5/12 S/P aortic valve repair.  Extubated on CRRT 5/13. Pt to remain in hospital for remaining IV abx for a total of 6 weeks.  Clinical Impression  Pt admitted with/for AMS, see course before and after leaving AMA on 5//17.  Pt has made great improvements, pt reports some improving back pain that is now no longer disrupting his progress..  Pt currently limited functionally due to the problems listed below.  (see problems list.)  Pt will benefit from PT to maximize function and safety to be able to get home safely with available assist.     Follow Up Recommendations No PT follow up    Equipment Recommendations  None recommended by PT    Recommendations for Other Services       Precautions / Restrictions Precautions Precautions: Fall      Mobility  Bed Mobility Overal bed mobility: Modified Independent                  Transfers Overall transfer level: Modified independent                  Ambulation/Gait Ambulation/Gait assistance: Modified independent (Device/Increase time);Supervision Gait Distance (Feet): 800 Feet Assistive device: None Gait Pattern/deviations: Step-through pattern   Gait velocity interpretation: >2.62 ft/sec, indicative of community  ambulatory General Gait Details: stable gait with ability to attain age appropriate gait speed for a short period, limited by dyspnea/fatigue.  Pt able to maintain balance without overt deviation to moderate challenge including abrupt directional and speed changes, scanning, stepping over obstacles and negotiation of stairs.  Stairs Stairs: Yes Stairs assistance: Min guard Stair Management: One rail Right;Step to pattern;Forwards Number of Stairs: 3 (x2) General stair comments: noticeable weakness ascending stairs, R LE weaker than L LE.  Safe with a rail and step to pattern.  Wheelchair Mobility    Modified Rankin (Stroke Patients Only) Modified Rankin (Stroke Patients Only) Pre-Morbid Rankin Score: No symptoms Modified Rankin: No significant disability     Balance Overall balance assessment: Needs assistance Sitting-balance support: Feet supported Sitting balance-Leahy Scale: Good Sitting balance - Comments: donning socks, moving outside his BOS   Standing balance support: No upper extremity supported Standing balance-Leahy Scale: Good                               Pertinent Vitals/Pain Pain Assessment: Faces Faces Pain Scale: No hurt Pain Intervention(s): Monitored during session    Home Living Family/patient expects to be discharged to:: Private residence Living Arrangements: Parent;Other relatives Available Help at Discharge: Family;Available 24 hours/day Type of Home: Mobile home Home Access: Stairs to enter   Entrance Stairs-Number of Steps: 1 Home Layout: One level Home Equipment: None      Prior Function Level of Independence: Independent               Hand Dominance   Dominant Hand: Right  Extremity/Trunk Assessment   Upper Extremity Assessment Upper Extremity Assessment: Overall WFL for tasks assessed    Lower Extremity Assessment Lower Extremity Assessment: Overall WFL for tasks assessed (bil proximal/large muscle group  weakness.)    Cervical / Trunk Assessment Cervical / Trunk Assessment: Normal  Communication   Communication: No difficulties  Cognition Arousal/Alertness: Awake/alert Behavior During Therapy: WFL for tasks assessed/performed Overall Cognitive Status: Within Functional Limits for tasks assessed                                        General Comments      Exercises     Assessment/Plan    PT Assessment Patient needs continued PT services  PT Problem List Decreased activity tolerance;Decreased mobility;Decreased coordination       PT Treatment Interventions Gait training;Stair training;Functional mobility training;Therapeutic activities;Therapeutic exercise;Balance training;Patient/family education    PT Goals (Current goals can be found in the Care Plan section)  Acute Rehab PT Goals Patient Stated Goal: To get stronger. PT Goal Formulation: With patient Time For Goal Achievement: 08/10/20 Potential to Achieve Goals: Good Additional Goals Additional Goal #1: DGI 22/24 to correlate to low risk of falling    Frequency Min 1X/week   Barriers to discharge   IV abx for 6 weeks in hospital    Co-evaluation               AM-PAC PT "6 Clicks" Mobility  Outcome Measure Help needed turning from your back to your side while in a flat bed without using bedrails?: None Help needed moving from lying on your back to sitting on the side of a flat bed without using bedrails?: None Help needed moving to and from a bed to a chair (including a wheelchair)?: None Help needed standing up from a chair using your arms (e.g., wheelchair or bedside chair)?: None Help needed to walk in hospital room?: None Help needed climbing 3-5 steps with a railing? : A Little 6 Click Score: 23    End of Session   Activity Tolerance: Patient tolerated treatment well Patient left: in bed;with call bell/phone within reach Nurse Communication: Mobility status PT Visit Diagnosis:  Other abnormalities of gait and mobility (R26.89)    Time: 1424-1450 PT Time Calculation (min) (ACUTE ONLY): 26 min   Charges:   PT Evaluation $PT Eval Low Complexity: 1 Low PT Treatments $Gait Training: 8-22 mins        08/17/2020  Ginger Carne., PT Acute Rehabilitation Services [phone]  (pager) [phone]  (office)  Tessie Fass Ranjit Ashurst 08/17/2020, 3:30 PM

## 2020-08-18 DIAGNOSIS — I330 Acute and subacute infective endocarditis: Secondary | ICD-10-CM | POA: Diagnosis not present

## 2020-08-18 DIAGNOSIS — I63119 Cerebral infarction due to embolism of unspecified vertebral artery: Secondary | ICD-10-CM | POA: Diagnosis not present

## 2020-08-18 DIAGNOSIS — I38 Endocarditis, valve unspecified: Secondary | ICD-10-CM | POA: Diagnosis not present

## 2020-08-18 LAB — COMPREHENSIVE METABOLIC PANEL
ALT: 6 U/L (ref 0–44)
AST: 14 U/L — ABNORMAL LOW (ref 15–41)
Albumin: 1.9 g/dL — ABNORMAL LOW (ref 3.5–5.0)
Alkaline Phosphatase: 51 U/L (ref 38–126)
Anion gap: 10 (ref 5–15)
BUN: 47 mg/dL — ABNORMAL HIGH (ref 6–20)
CO2: 24 mmol/L (ref 22–32)
Calcium: 8 mg/dL — ABNORMAL LOW (ref 8.9–10.3)
Chloride: 102 mmol/L (ref 98–111)
Creatinine, Ser: 5.43 mg/dL — ABNORMAL HIGH (ref 0.61–1.24)
GFR, Estimated: 13 mL/min — ABNORMAL LOW (ref >60)
Glucose, Bld: 87 mg/dL (ref 70–99)
Potassium: 3.8 mmol/L (ref 3.5–5.1)
Sodium: 136 mmol/L (ref 135–145)
Total Bilirubin: 0.9 mg/dL (ref 0.3–1.2)
Total Protein: 6 g/dL — ABNORMAL LOW (ref 6.5–8.1)

## 2020-08-18 LAB — PROTIME-INR
INR: 1.8 — ABNORMAL HIGH (ref 0.8–1.2)
Prothrombin Time: 20.9 seconds — ABNORMAL HIGH (ref 11.4–15.2)

## 2020-08-18 LAB — CBC
HCT: 24.4 % — ABNORMAL LOW (ref 39.0–52.0)
Hemoglobin: 7.4 g/dL — ABNORMAL LOW (ref 13.0–17.0)
MCH: 27.2 pg (ref 26.0–34.0)
MCHC: 30.3 g/dL (ref 30.0–36.0)
MCV: 89.7 fL (ref 80.0–100.0)
Platelets: 286 10*3/uL (ref 150–400)
RBC: 2.72 MIL/uL — ABNORMAL LOW (ref 4.22–5.81)
RDW: 15.4 % (ref 11.5–15.5)
WBC: 8.4 10*3/uL (ref 4.0–10.5)
nRBC: 0 % (ref 0.0–0.2)

## 2020-08-18 LAB — MAGNESIUM: Magnesium: 2.3 mg/dL (ref 1.7–2.4)

## 2020-08-18 LAB — HEPARIN LEVEL (UNFRACTIONATED): Heparin Unfractionated: <0.1 IU/mL — ABNORMAL LOW (ref 0.30–0.70)

## 2020-08-18 LAB — PHOSPHORUS: Phosphorus: 6.4 mg/dL — ABNORMAL HIGH (ref 2.5–4.6)

## 2020-08-18 MED ORDER — CHLORHEXIDINE GLUCONATE CLOTH 2 % EX PADS
6.0000 | MEDICATED_PAD | Freq: Every day | CUTANEOUS | Status: DC
  Administered 2020-08-18 – 2020-08-20 (×3): 6 via TOPICAL

## 2020-08-18 MED ORDER — POLYSACCHARIDE IRON COMPLEX 150 MG PO CAPS
150.0000 mg | ORAL_CAPSULE | Freq: Every day | ORAL | Status: DC
  Administered 2020-08-18 – 2020-09-15 (×29): 150 mg via ORAL
  Filled 2020-08-18 (×30): qty 1

## 2020-08-18 MED ORDER — HEPARIN (PORCINE) 25000 UT/250ML-% IV SOLN
3250.0000 [IU]/h | INTRAVENOUS | Status: DC
  Administered 2020-08-18: 1300 [IU]/h via INTRAVENOUS
  Administered 2020-08-19: 2100 [IU]/h via INTRAVENOUS
  Administered 2020-08-20: 2600 [IU]/h via INTRAVENOUS
  Administered 2020-08-21 (×2): 2900 [IU]/h via INTRAVENOUS
  Administered 2020-08-22: 3150 [IU]/h via INTRAVENOUS
  Administered 2020-08-23: 3250 [IU]/h via INTRAVENOUS
  Filled 2020-08-18 (×13): qty 250

## 2020-08-18 MED ORDER — WARFARIN SODIUM 5 MG PO TABS
5.0000 mg | ORAL_TABLET | Freq: Once | ORAL | Status: AC
  Administered 2020-08-18: 5 mg via ORAL
  Filled 2020-08-18: qty 1

## 2020-08-18 NOTE — Progress Notes (Signed)
[facility] PROGRESS NOTE  CRISTIANO AKKERMAN [id number] DOB: 05-Feb-1981 DOA: 08/08/2020 PCP: [facility]  Status:  Remains inpatient appropriate because:Unsafe d/c plan, IV treatments appropriate due to intensity of illness or inability to take PO and Inpatient level of care appropriate due to severity of illness   Dispo: The patient is from: Home              Anticipated d/c is to: Home              Patient currently is not medically stable to d/c.   Difficult to place patient Yes   Level of care: Progressive  Code Status: Full Family Communication:  DVT prophylaxis: Heparin infusion COVID vaccination status: Received first dose of Pfizer on 08/16/2020  HPI: 40 year old male with history of polysubstance abuse, aortic valve endocarditis, Serratia bacteremia, septic emboli causing stroke and acute kidney injury requiring hemodialysis.  Patient left hospital AMA on 08/08/2020.  He came back same night after discussion with CT surgery.  Significant events:  4/26 transfer from [facility] to [facility]  CT head 4/26 >> no acute findings  CT abd/pelvis 4/26 >> cholelithiasis w/o inflammatory chagnes, mild hepatomegaly (25 cm), moderate splenomegaly (16.7 cm), 2 mm non obstructing kidney stone on Lt  4/27 CVL placed. Pressors escalating.   4/28 intubated for TEE, CVC, HD, and A-line placed. CRRT started   Remains on CRRT  MRI on 4/30-multiple infarcts  5/4: repeat TEE shows an aortic valve abscess  5/5: extubated , self-removed HD cath so CRRT holiday> developed severe hyperkalemia overnight.  5/6: new temp HD cath placed   5/8: new temp HD cath placed after prior one self-removed by patient  5/9: Orthopantogram neg for abscess  5/10: HD cath replaced by IR [employer]  5/12 to OR for AVR, aortic root reconstruction, closure of aortic sinus track. To 2H ICU on vent post op. 4 units PRBC given intraop.   5/13 extubated, on CRRT, remains on  pressors  5/18, left AMA.  Subjective: Alert.  No specific complaints.  Objective: Vitals:   08/18/20 0422 08/18/20 0742  BP: 137/83 (!) 145/85  Pulse: 85 83  Resp: 20 18  Temp: 97.8 F (36.6 C) 97.8 F (36.6 C)  SpO2: 98% 100%    Intake/Output Summary (Last 24 hours) at 08/18/2020 0831 Last data filed at 08/18/2020 0426 Gross per 24 hour  Intake --  Output 2350 ml  Net -2350 ml   Filed Weights   08/15/20 1125 08/16/20 0436 08/18/20 0422  Weight: 130.2 kg 128.6 kg 129.8 kg    Exam:  Constitutional: NAD, calm, comfortable Respiratory: clear to auscultation bilaterally, no wheezing, no crackles. Normal respiratory effort. No accessory muscle use.  Cardiovascular: Regular rate and rhythm, no murmurs / rubs / gallops. No extremity edema. 2+ pedal pulses. No carotid bruits.  Abdomen: no tenderness, no masses palpated. No hepatosplenomegaly. Bowel sounds positive.  Genitourinary: Making urine Neurologic: CN 2-12 grossly intact. Sensation intact, DTR normal. Strength 5/5 x all 4 extremities.  Psychiatric: Normal judgment and insight. Alert and oriented x 3. Normal mood.    Assessment/Plan: Acute problems: Aortic valve endocarditis with perivalvular abscess-Serratia bacteremia, septic emboli causing strokes.   -Presented with abscess physiology on 4/26 eventually was transferred to ICU, was intubated.   -MRI brain on 07/22/2020 revealed multiple acute infarcts.   -Blood cultures positive for Serratia and Echocardiogram showed severe AI and AR with AV vegetation confirmed by TEE.   -Now s/p mechanical aortic valve  replacement, aortic root construction and closure of aortic sinus tract.   -Needs 6 weeks of cefepime per ID with start date from 08/04/2020. -Continue Coumadin per pharmacy.    -Patient left AMA on 08/07/2020 however came back to hospital after speaking with CT surgery on 08/08/2020.  Acute kidney injury -likely from ATN from infection -Nephrology following   -Initially required CRRT and currently is on intermittent hemodialysis at discretion of nephrology team. -Continues to make urine  Anemia/thrombocytopenia -patient is s/p 6 units PRBC during last hospital stay.   -Transfuse PRBC as needed for hemoglobin less than 7. -Hemoglobin is greater than 7.0  Polysubstance abuse -patient has amphetamine abuse, nicotine dependence, opiate dependence on agonist therapy.  He is on chronic Suboxone.  Bilateral lower extremity edema -likely from renal dysfunction as as well as chronic diastolic CHF.  Started on [employer].  Obesity BMI-34.14kg/m    Data Reviewed: Basic Metabolic Panel: Recent Labs  Lab 08/14/20 0227 08/15/20 0414 08/16/20 0142 08/17/20 0234 08/18/20 0100  NA 127* 131* 132* 132* 136  K 4.7 4.3 3.9 3.8 3.8  CL 98 100 100 99 102  CO2 20* 21* '26 24 24  '$ GLUCOSE 92 91 99 92 87  BUN 67* 68* 33* 40* 47*  CREATININE 6.01* 6.11* 4.10* 4.96* 5.43*  CALCIUM 8.0* 8.1* 7.8* 7.9* 8.0*  MG  --   --   --  2.2 2.3  PHOS 8.2* 8.2* 5.3* 6.4* 6.4*   Liver Function Tests: Recent Labs  Lab 08/14/20 0227 08/15/20 0414 08/16/20 0142 08/17/20 0234 08/18/20 0100  AST  --   --   --  13* 14*  ALT  --   --   --  7 6  ALKPHOS  --   --   --  52 51  BILITOT  --   --   --  1.0 0.9  PROT  --   --   --  5.8* 6.0*  ALBUMIN 1.9* 1.9* 1.9* 1.9* 1.9*     CBC: Recent Labs  Lab 08/15/20 0414 08/16/20 0142 08/16/20 0941 08/17/20 0234 08/18/20 0100  WBC 9.3 9.4 8.3 8.3 8.4  NEUTROABS  --   --  5.9  --   --   HGB 8.4* 8.3* 8.1* 7.5* 7.4*  HCT 26.1* 25.7* 26.1* 24.2* 24.4*  MCV 87.3 87.4 88.8 89.3 89.7  PLT 341 333 324 314 286    BNP (last 3 results) Recent Labs    07/25/20 0334 07/26/20 0331  BNP 153.5* 76.0     Continuous Infusions: . ceFEPime (MAXIPIME) IV 2 g (08/17/20 2154)  . heparin      Principal Problem:   Endocarditis Active Problems:   Opioid use disorder   Acute renal failure with oliguria (HCC)   Cerebral  embolism with cerebral infarction   Anemia   Consultants:  Infectious disease  PCCM  Neurology  CT surgery  Procedures:  Echocardiogram  TEE  CRRT  Cortrack  Antibiotics: Anti-infectives (From admission, onward)   Start     Dose/Rate Route Frequency Ordered Stop   08/13/20 2200  ceFEPIme (MAXIPIME) 2 g in sodium chloride 0.9 % 100 mL IVPB        2 g 200 mL/hr over 30 Minutes Intravenous Every 24 hours 08/13/20 1154     08/09/20 2200  ceFEPIme (MAXIPIME) 1 g in sodium chloride 0.9 % 100 mL IVPB  Status:  Discontinued        1 g 200 mL/hr over 30 Minutes Intravenous Every 24  hours 08/09/20 1238 08/13/20 1154   08/09/20 0000  ceFEPIme (MAXIPIME) 1 g in sodium chloride 0.9 % 100 mL IVPB  Status:  Discontinued        1 g 200 mL/hr over 30 Minutes Intravenous Every 24 hours 08/08/20 2305 08/09/20 1238        Time spent: 35 minutes    Erin Hearing ANP  [facility] 7 am - 330 pm/M-F for direct patient care and secure chat Please refer to Amion for contact info 10  days

## 2020-08-18 NOTE — Progress Notes (Signed)
ANTICOAGULATION CONSULT NOTE - Follow Up Consult  Pharmacy Consult for Warfarin / Heparin Indication: mechanical aortic valve  No Known Allergies  Patient Measurements: Height: '6\' 4"'$  (193 cm) Weight: 129.8 kg (286 lb 1.6 oz) IBW/kg (Calculated) : 86.8  Heparin dosing weight: 117 kg  Vital Signs: Temp: 97.5 F (36.4 C) (05/27 1647) Temp Source: Oral (05/27 1647) BP: 147/85 (05/27 1647) Pulse Rate: 83 (05/27 0742)  Labs: Recent Labs    08/16/20 0142 08/16/20 0941 08/17/20 0234 08/18/20 0100 08/18/20 1625  HGB 8.3* 8.1* 7.5* 7.4*  --   HCT 25.7* 26.1* 24.2* 24.4*  --   PLT 333 324 314 286  --   LABPROT 26.7*  --  23.4* 20.9*  --   INR 2.5*  --  2.1* 1.8*  --   HEPARINUNFRC  --   --   --   --  <0.10*  CREATININE 4.10*  --  4.96* 5.43*  --     Estimated Creatinine Clearance: 27.4 mL/min (A) (by C-G formula based on SCr of 5.43 mg/dL (H)).  Assessment: 39yom with recent endocarditis> AV root repair and Mechanical AVR on 08/03/20. Warfarin started 5/13. Left AMA 5/16 pm then returned 5/17 pm. INR 2.6 on 5/18 and Warfarin resumed.    INR down to 1.8 - to begin heparin until > 2 again. Initial heparin level came back undetectable on heparin 1300 units/hr. No s/sx of bleeding or infusion issues per nursing.   Goal of Therapy:  Heparin level 0.3-0.7 INR 2.5-3.5 per Dr. Kipp Brood Monitor platelets by anticoagulation protocol: Yes   Plan:  Increase heparin drip to 1600 units / hr Heparin level in 8 hours Daily INR, heparin level, CBC  Antonietta Jewel, PharmD, [place] Pharmacist  Phone: [phone] 08/18/2020 5:04 PM  Please check AMION for all [place] phone numbers After 10:00 PM, call [place] [phone]

## 2020-08-18 NOTE — Progress Notes (Signed)
ANTICOAGULATION CONSULT NOTE - Follow Up Consult  Pharmacy Consult for Warfarin / Heparin Indication: mechanical aortic valve  No Known Allergies  Patient Measurements: Height: '6\' 4"'$  (193 cm) Weight: 129.8 kg (286 lb 1.6 oz) IBW/kg (Calculated) : 86.8  Heparin dosing weight: 117 kg  Vital Signs: Temp: 97.8 F (36.6 C) (05/27 0742) Temp Source: Oral (05/27 0742) BP: 145/85 (05/27 0742) Pulse Rate: 83 (05/27 0742)  Labs: Recent Labs    08/16/20 0142 08/16/20 0941 08/17/20 0234 08/18/20 0100  HGB 8.3* 8.1* 7.5* 7.4*  HCT 25.7* 26.1* 24.2* 24.4*  PLT 333 324 314 286  LABPROT 26.7*  --  23.4* 20.9*  INR 2.5*  --  2.1* 1.8*  CREATININE 4.10*  --  4.96* 5.43*    Estimated Creatinine Clearance: 27.4 mL/min (A) (by C-G formula based on SCr of 5.43 mg/dL (H)).  Assessment: 39yom with recent endocarditis> AV root repair and Mechanical AVR on 08/03/20. Warfarin started 5/13. Left AMA 5/16 pm then returned 5/17 pm. INR 2.6 on 5/18 and Warfarin resumed.    INR down to 1.8 - to begin heparin until > 2 again  Goal of Therapy:  Heparin level 0.3-0.7 INR 2.5-3.5 per Dr. Kipp Brood Monitor platelets by anticoagulation protocol: Yes   Plan:  Heparin drip at 1300 units / hr Heparin level in 8 hours Repeat 5 mg po x 1 today - may require higher dose 5/28 Daily INR, heparin level, CBC  Thank you Anette Guarneri, PharmD  Please check AMION for all [place] phone numbers After 10:00 PM, call [place]
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
## 2020-08-18 NOTE — Progress Notes (Signed)
Orthopedic Tech Progress Note Patient Details:  Alex [name] [birth year] [id number]   Ortho Devices Type of Ortho Device: Louretta Parma boot Ortho Device/Splint Location: Bilateral Ortho Device/Splint Interventions: Application   Post Interventions Patient Tolerated: Well Instructions Provided: Care of device,Adjustment of device   Sangita Zani Jeri Modena 08/18/2020, 4:21 PM

## 2020-08-19 DIAGNOSIS — R34 Anuria and oliguria: Secondary | ICD-10-CM | POA: Diagnosis not present

## 2020-08-19 DIAGNOSIS — I38 Endocarditis, valve unspecified: Secondary | ICD-10-CM | POA: Diagnosis not present

## 2020-08-19 DIAGNOSIS — N179 Acute kidney failure, unspecified: Secondary | ICD-10-CM | POA: Diagnosis not present

## 2020-08-19 LAB — COMPREHENSIVE METABOLIC PANEL
ALT: 8 U/L (ref 0–44)
AST: 17 U/L (ref 15–41)
Albumin: 2.2 g/dL — ABNORMAL LOW (ref 3.5–5.0)
Alkaline Phosphatase: 62 U/L (ref 38–126)
Anion gap: 7 (ref 5–15)
BUN: 17 mg/dL (ref 6–20)
CO2: 29 mmol/L (ref 22–32)
Calcium: 8.4 mg/dL — ABNORMAL LOW (ref 8.9–10.3)
Chloride: 101 mmol/L (ref 98–111)
Creatinine, Ser: 2.9 mg/dL — ABNORMAL HIGH (ref 0.61–1.24)
GFR, Estimated: 28 mL/min — ABNORMAL LOW (ref >60)
Glucose, Bld: 81 mg/dL (ref 70–99)
Potassium: 3.9 mmol/L (ref 3.5–5.1)
Sodium: 137 mmol/L (ref 135–145)
Total Bilirubin: 0.8 mg/dL (ref 0.3–1.2)
Total Protein: 6.7 g/dL (ref 6.5–8.1)

## 2020-08-19 LAB — CBC
HCT: 28.2 % — ABNORMAL LOW (ref 39.0–52.0)
Hemoglobin: 8.6 g/dL — ABNORMAL LOW (ref 13.0–17.0)
MCH: 27.3 pg (ref 26.0–34.0)
MCHC: 30.5 g/dL (ref 30.0–36.0)
MCV: 89.5 fL (ref 80.0–100.0)
Platelets: 298 10*3/uL (ref 150–400)
RBC: 3.15 MIL/uL — ABNORMAL LOW (ref 4.22–5.81)
RDW: 15.1 % (ref 11.5–15.5)
WBC: 8 10*3/uL (ref 4.0–10.5)
nRBC: 0 % (ref 0.0–0.2)

## 2020-08-19 LAB — PROTIME-INR
INR: 1.7 — ABNORMAL HIGH (ref 0.8–1.2)
Prothrombin Time: 20.2 seconds — ABNORMAL HIGH (ref 11.4–15.2)

## 2020-08-19 LAB — HEPARIN LEVEL (UNFRACTIONATED)
Heparin Unfractionated: 0.11 IU/mL — ABNORMAL LOW (ref 0.30–0.70)
Heparin Unfractionated: <0.1 IU/mL — ABNORMAL LOW (ref 0.30–0.70)
Heparin Unfractionated: 0.14 IU/mL — ABNORMAL LOW (ref 0.30–0.70)

## 2020-08-19 LAB — PHOSPHORUS: Phosphorus: 2.9 mg/dL (ref 2.5–4.6)

## 2020-08-19 LAB — MAGNESIUM: Magnesium: 2.1 mg/dL (ref 1.7–2.4)

## 2020-08-19 MED ORDER — DARBEPOETIN ALFA 150 MCG/0.3ML IJ SOSY: 150.0000 ug | PREFILLED_SYRINGE | Freq: Once | INTRAMUSCULAR | Status: DC

## 2020-08-19 MED ORDER — HEPARIN BOLUS VIA INFUSION
3000.0000 [IU] | Freq: Once | INTRAVENOUS | Status: AC
  Administered 2020-08-19: 3000 [IU] via INTRAVENOUS
  Filled 2020-08-19: qty 3000

## 2020-08-19 MED ORDER — SODIUM CHLORIDE 0.9 % IV SOLN: INTRAVENOUS | Status: AC

## 2020-08-19 MED ORDER — DARBEPOETIN ALFA 150 MCG/0.3ML IJ SOSY
150.0000 ug | PREFILLED_SYRINGE | INTRAMUSCULAR | Status: DC
  Administered 2020-08-19 – 2020-09-09 (×4): 150 ug via SUBCUTANEOUS
  Filled 2020-08-19 (×4): qty 0.3

## 2020-08-19 MED ORDER — WARFARIN SODIUM 5 MG PO TABS
5.0000 mg | ORAL_TABLET | Freq: Once | ORAL | Status: AC
  Administered 2020-08-19: 5 mg via ORAL
  Filled 2020-08-19: qty 1

## 2020-08-19 MED ORDER — HEPARIN SODIUM (PORCINE) 1000 UNIT/ML IJ SOLN
INTRAMUSCULAR | Status: AC
  Filled 2020-08-19: qty 4

## 2020-08-19 MED ORDER — HEPARIN BOLUS VIA INFUSION
3500.0000 [IU] | Freq: Once | INTRAVENOUS | Status: AC
  Administered 2020-08-19: 3500 [IU] via INTRAVENOUS
  Filled 2020-08-19: qty 3500

## 2020-08-19 NOTE — Progress Notes (Signed)
ANTICOAGULATION CONSULT NOTE - Follow Up Consult  Pharmacy Consult for Warfarin / Heparin Indication: mechanical aortic valve  Patient Measurements: Height: '6\' 4"'$  (193 cm) Weight: 129.8 kg (286 lb 1.6 oz) IBW/kg (Calculated) : 86.8  Heparin dosing weight: 117 kg  Labs: Recent Labs    08/17/20 0234 08/18/20 0100 08/18/20 1625 08/19/20 0309 08/19/20 1148 08/19/20 2028  HGB 7.5* 7.4*  --  8.6*  --   --   HCT 24.2* 24.4*  --  28.2*  --   --   PLT 314 286  --  298  --   --   LABPROT 23.4* 20.9*  --  20.2*  --   --   INR 2.1* 1.8*  --  1.7*  --   --   HEPARINUNFRC  --   --    < > 0.11* <0.10* 0.14*  CREATININE 4.96* 5.43*  --  2.90*  --   --    < > = values in this interval not displayed.   Estimated Creatinine Clearance: 51.4 mL/min (A) (by C-G formula based on SCr of 2.9 mg/dL (H)).  Assessment: 39yom with recent endocarditis> AV root repair and Mechanical AVR on 08/03/20. Warfarin started 5/13. Left AMA 5/16 pm then returned 5/17 pm. INR 2.6 on 5/18 and Warfarin resumed. Pharmacy was consulted to bridge warfarin with heparin until INR >2 again.   INR subtherapeutic (1.7) 5/28am - trending down. Renal function significantly improved. LFTs WNL. May be reflecting previous dose reductions. Received '5mg'$  warfarin dose tonight (5/28)  Heparin level is subtherapeutic at 0.14 on 2100 units/hr. Hgb low/stable, PLTs WNL. Per discussion with RN, no issues with infusion.   Goal of Therapy:  Heparin level 0.3-0.7 INR 2.5-3.5 per Dr. Kipp Brood Monitor platelets by anticoagulation protocol: Yes   Plan:  Bolus heparin 3500 units, then increase rate to 2500 units / hr Heparin level in 6 hours Daily INR, heparin level, CBC   Lorelei Pont, PharmD, BCPS Emergency Medicine Clinical Pharmacist 08/19/2020 9:17 PM

## 2020-08-19 NOTE — Progress Notes (Signed)
Nephrology progress note:   45M AV IE and perivalvular abscess, serrata sp.   AVR repair 5/12;  Anuric AKI from ATN-  Normal crt in Feb 2022 then 5 on 07/18/20 in the setting of IE  S:  Last HD on 5/27 late night; kept even.  Had 2.1 liters UOP over 5/27.  Feels good today.  States leg swelling has gone down considerably.  Legs still wrapped     Review of systems:  Denies shortness of breath or chest pain Denies n/v  O: 05/27 0701 - 05/28 0700 In: 734.4 [P.O.:440; I.V.:294.4] Out: 2050 [Urine:2050]   Vitals:   08/19/20 0223 08/19/20 0252  BP: 131/78 (!) 145/87  Pulse: 94 79  Resp: 14 15  Temp: 98.4 F (36.9 C) 97.8 F (36.6 C)  SpO2: 99% 100%   General adult male in bed in no acute distress  HEENT normocephalic atraumatic extraocular movements intact sclera anicteric Neck supple trachea midline Lungs clear to auscultation bilaterally unlabored room air  Heart mechanical click; normal rate; no rub Abdomen soft nontender nondistended Extremities trace to 1+ edema; legs wrapped  Psych normal mood and affect Neuro alert and oriented and conversant Access: RIJ tunneled catheter   Filed Weights   08/16/20 0436 08/18/20 0422 08/19/20 0251  Weight: 128.6 kg 129.8 kg 130.3 kg    Recent Labs  Lab 08/17/20 0234 08/18/20 0100 08/19/20 0309  NA 132* 136 137  K 3.8 3.8 3.9  CL 99 102 101  CO2 '24 24 29  '$ GLUCOSE 92 87 81  BUN 40* 47* 17  CREATININE 4.96* 5.43* 2.90*  CALCIUM 7.9* 8.0* 8.4*  PHOS 6.4* 6.4* 2.9   Recent Labs  Lab 08/16/20 0941 08/17/20 0234 08/18/20 0100 08/19/20 0309  WBC 8.3 8.3 8.4 8.0  NEUTROABS 5.9  --   --   --   HGB 8.1* 7.5* 7.4* 8.6*  HCT 26.1* 24.2* 24.4* 28.2*  MCV 88.8 89.3 89.7 89.5  PLT 324 314 286 298    Scheduled Meds: . (feeding supplement) PROSource Plus  30 mL Oral BID BM  . buprenorphine-naloxone  1 tablet Sublingual BID  . Chlorhexidine Gluconate Cloth  6 each Topical Daily  . Chlorhexidine Gluconate Cloth  6 each Topical  Q0600  . Chlorhexidine Gluconate Cloth  6 each Topical Q0600  . collagenase   Topical Daily  . darbepoetin (ARANESP) injection - NON-DIALYSIS  150 mcg Subcutaneous Q Fri-1800  . heparin sodium (porcine)      . iron polysaccharides  150 mg Oral Daily  . lactulose  30 g Oral BID  . lidocaine  1 patch Transdermal Q24H  . melatonin  3 mg Oral QHS  . methocarbamol  500 mg Oral TID  . nicotine  14 mg Transdermal Daily  . polyethylene glycol  17 g Oral Daily  . senna-docusate  1 tablet Oral BID  . sevelamer carbonate  800 mg Oral TID WC  . Warfarin - Pharmacist Dosing Inpatient   Does not apply q1600   Continuous Infusions: . ceFEPime (MAXIPIME) IV 2 g (08/19/20 0316)  . heparin 1,800 Units/hr (08/19/20 0354)   PRN Meds:.acetaminophen **OR** acetaminophen, bisacodyl, clonazePAM, ondansetron **OR** ondansetron (ZOFRAN) IV  ABG    Component Value Date/Time   PHART 7.405 08/03/2020 2033   PCO2ART 31.4 (L) 08/03/2020 2033   PO2ART 81 (L) 08/03/2020 2033   HCO3 19.6 (L) 08/03/2020 2033   TCO2 21 (L) 08/03/2020 2033   ACIDBASEDEF 4.0 (H) 08/03/2020 2033   O2SAT 96.0 08/03/2020 2033  A/P  1. Severe AKI - likely secondary to ATN related to infection and surgery.  S/p CRRT then transitioned to hemodialysis on 5/16.  Has required HD for clearance but is nonoliguric 1. Hold HD today and continue to monitor inpatient. Anticipate next HD on 5/30 if needed. 2. NS at 75/hr x 12 hours.  Albumin is 2.2.  Continue leg wraps  2. 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  Management per primary team.  Pharmacy following for cefepime dosing 3. VDRF, resolved  4. Anemia normocytic - Aranesp 150 mcg weekly - he missed the 5/27 dose and I have retimed for weekly on Sat. Defer IV iron with bacteremia.  On iron polysaccharide 150 mg daily 5. Hyponatremia: Likely related to volume excess.  Resolved with HD.  6. Hyperphosphatemia:  improved with HD.  Started renvela 5/24 - will stop renvela as phos 2.9. renal diet  7. Constipation: improved; please avoid mag citrate and fleet's enemas  Disposition - no outpatient HD unit and still requiring iHD.  States he was told he would be here several weeks for IV abx.   Claudia Desanctis, MD 08/19/2020 7:34 AM

## 2020-08-19 NOTE — Progress Notes (Signed)
ANTICOAGULATION CONSULT NOTE - Follow Up Consult  Pharmacy Consult for Warfarin / Heparin Indication: mechanical aortic valve  Patient Measurements: Height: '6\' 4"'$  (193 cm) Weight: 129.8 kg (286 lb 1.6 oz) IBW/kg (Calculated) : 86.8  Heparin dosing weight: 117 kg  Labs: Recent Labs    08/17/20 0234 08/18/20 0100 08/18/20 1625 08/19/20 0309 08/19/20 1148  HGB 7.5* 7.4*  --  8.6*  --   HCT 24.2* 24.4*  --  28.2*  --   PLT 314 286  --  298  --   LABPROT 23.4* 20.9*  --  20.2*  --   INR 2.1* 1.8*  --  1.7*  --   HEPARINUNFRC  --   --  <0.10* 0.11* <0.10*  CREATININE 4.96* 5.43*  --  2.90*  --     Estimated Creatinine Clearance: 51.4 mL/min (A) (by C-G formula based on SCr of 2.9 mg/dL (H)).  Assessment: 39yom with recent endocarditis> AV root repair and Mechanical AVR on 08/03/20. Warfarin started 5/13. Left AMA 5/16 pm then returned 5/17 pm. INR 2.6 on 5/18 and Warfarin resumed. Pharmacy was consulted to bridge warfarin with heparin until INR >2 again.   INR is subtherapeutic at 1.7 and continues to trend down. Patient eating 100% of meals. Renal function significantly improved. LFTs WNL. May be reflecting previous dose reductions.  Heparin level is subtherapeutic at <0.10 on 1800 units/hr. Hgb low/stable, PLTs WNL. Per discussion with RN, no issues with infusion.   Goal of Therapy:  Heparin level 0.3-0.7 INR 2.5-3.5 per Dr. Kipp Brood Monitor platelets by anticoagulation protocol: Yes   Plan:  Bolus heparin 3000 units, then increase rate to 2100 units / hr Warfarin 5 mg x1 Heparin level in 6-8 hours Daily INR, heparin level, CBC   Romilda Garret, PharmD PGY1 Acute Care Pharmacy Resident 08/19/2020 12:58 PM  Please check [email or web address] for unit specific pharmacy phone numbers.

## 2020-08-19 NOTE — Progress Notes (Signed)
ANTICOAGULATION CONSULT NOTE - Follow Up Consult  Pharmacy Consult for Warfarin / Heparin Indication: mechanical aortic valve  Labs: Recent Labs    08/17/20 0234 08/18/20 0100 08/18/20 1625 08/19/20 0309  HGB 7.5* 7.4*  --  8.6*  HCT 24.2* 24.4*  --  28.2*  PLT 314 286  --  298  LABPROT 23.4* 20.9*  --  20.2*  INR 2.1* 1.8*  --  1.7*  HEPARINUNFRC  --   --  <0.10* 0.11*  CREATININE 4.96* 5.43*  --   --     Estimated Creatinine Clearance: 27.5 mL/min (A) (by C-G formula based on SCr of 5.43 mg/dL (H)).  Assessment: 39yom with recent endocarditis> AV root repair and Mechanical AVR on 08/03/20. Warfarin started 5/13. Left AMA 5/16 pm then returned 5/17 pm. INR 2.6 on 5/18 and Warfarin resumed.    INR down to 1.8 - to begin heparin until > 2 again.  Heparin level this morning 0.11 units/ml  Goal of Therapy:  Heparin level 0.3-0.7 INR 2.5-3.5 per Dr. Kipp Brood Monitor platelets by anticoagulation protocol: Yes   Plan:  Increase heparin drip to 1800 units / hr Heparin level in 8 hours Daily INR, heparin level, CBC  Thanks for allowing pharmacy to be a part of this patient's care.  Excell Seltzer, PharmD Clinical Pharmacist 08/19/2020 3:50 AM

## 2020-08-19 NOTE — Progress Notes (Signed)
[facility]  PROGRESS NOTE  Alex [name] [id number] DOB: [birth year] DOA: 08/08/2020 PCP: [facility]   Brief HPI:   40 year old male with history of polysubstance abuse, aortic valve endocarditis, Serratia bacteremia, septic emboli causing stroke and acute kidney injury requiring hemodialysis.  Patient left hospital AMA on 08/08/2020.  He came back same night after discussion with CT surgery.   4/26 transfer from [facility] to [facility]  CT head 4/26 >> no acute findings  CT abd/pelvis 4/26 >> cholelithiasis w/o inflammatory chagnes, mild hepatomegaly (25 cm), moderate splenomegaly (16.7 cm), 2 mm non obstructing kidney stone on Lt  4/27 CVL placed. Pressors escalating.   4/28 intubated for TEE, CVC, HD, and A-line placed. CRRT started   Remains on CRRT  MRI on 4/30-multiple infarcts  5/4: repeat TEE shows an aortic valve abscess  5/5: extubated , self-removed HD cath so CRRT holiday> developed severe hyperkalemia overnight.  5/6: new temp HD cath placed   5/8: new temp HD cath placed after prior one self-removed by patient  5/9: Orthopantogram neg for abscess  5/10: HD cath replaced by IR [facility]  5/12 to OR for AVR, aortic root reconstruction, closure of aortic sinus track. To 2H ICU on vent post op. 4 units PRBC given intraop.   5/13 extubated, on CRRT, remains on pressors  5/18, left AMA.   Subjective   Patient seen and examined, denies any complaints.   Assessment/Plan:     1. Aortic valve endocarditis with perivalvular abscess-Serratia bacteremia, septic emboli causing strokes.  Patient presented with headaches and fever, developed hypotension, AKI, thrombocytopenia on admission on 4/26.  He was transferred to ICU, was intubated.  MRI brain on 07/22/2020 revealed multiple acute infarcts.  Blood cultures positive for Serratia bacteremia.  Echocardiogram showed severe AI and AR with AV vegetation confirmed by TEE.  He is s/p  mechanical aortic valve replacement, aortic root construction and closure of aortic sinus tract.  Patient needs 6 weeks of cefepime per ID with start date from 08/04/2020.  Continue Coumadin per pharmacy.  Keep INR between 2.5-3.5.  Patient left AMA on 08/07/2020 however came back to hospital after recommendation by CT surgery on 08/08/2020. 2. Acute kidney injury-likely from ATN from infection, hyponatremia, hypophosphatemia.  Nephrology was consulted.  Patient was initially placed on CRRT while he was in the ICU on 4/28.  Following which patient was placed on HD with attempted [facility] catheter placement.  Patient received dialysis day before yesterday.  Today creatinine is stable at 4.95.  Nephrology following. 3. Anemia/thrombocytopenia-patient is s/p 6 units PRBC during last hospital stay.  Hemoglobin is stable.  Transfuse PRBC as needed for hemoglobin less than 7.  Hemoglobin is stable at  4. Polysubstance abuse-patient has amphetamine abuse, nicotine dependence, opiate dependence on agonist therapy.  He is on chronic Suboxone. 5. Bilateral lower extremity edema-likely from renal dysfunction as as well as chronic diastolic CHF.  Started on [employer]. 6. BMI-34.14kg/m   Scheduled medications:   . (feeding supplement) PROSource Plus  30 mL Oral BID BM  . buprenorphine-naloxone  1 tablet Sublingual BID  . Chlorhexidine Gluconate Cloth  6 each Topical Daily  . Chlorhexidine Gluconate Cloth  6 each Topical Q0600  . Chlorhexidine Gluconate Cloth  6 each Topical Q0600  . collagenase   Topical Daily  . darbepoetin (ARANESP) injection - NON-DIALYSIS  150 mcg Subcutaneous Q Sat-1800  . heparin sodium (porcine)      . iron polysaccharides  150 mg Oral  Daily  . lactulose  30 g Oral BID  . lidocaine  1 patch Transdermal Q24H  . melatonin  3 mg Oral QHS  . methocarbamol  500 mg Oral TID  . nicotine  14 mg Transdermal Daily  . polyethylene glycol  17 g Oral Daily  . senna-docusate  1 tablet Oral BID  .  warfarin  5 mg Oral ONCE-1600  . Warfarin - Pharmacist Dosing Inpatient   Does not apply q1600         Data Reviewed:   CBG:  No results for input(s): GLUCAP in the last 168 hours.  SpO2: 97 %    Vitals:   08/19/20 0251 08/19/20 0252 08/19/20 0751 08/19/20 1209  BP:  (!) 145/87 (!) 137/92 127/83  Pulse:  79 85 92  Resp:  '15 18 18  '$ Temp:  97.8 F (36.6 C) 98 F (36.7 C) 98 F (36.7 C)  TempSrc:  Oral Oral Oral  SpO2:  100% 95% 97%  Weight: 130.3 kg     Height:         Intake/Output Summary (Last 24 hours) at 08/19/2020 1420 Last data filed at 08/19/2020 1349 Gross per 24 hour  Intake 2035.12 ml  Output 2050 ml  Net -14.88 ml    05/26 1901 - 05/28 0700 In: 734.4 [P.O.:440; I.V.:294.4] Out: 3300 [Urine:3300]  Filed Weights   08/16/20 0436 08/18/20 0422 08/19/20 0251  Weight: 128.6 kg 129.8 kg 130.3 kg    CBC:  Recent Labs  Lab 08/16/20 0142 08/16/20 0941 08/17/20 0234 08/18/20 0100 08/19/20 0309  WBC 9.4 8.3 8.3 8.4 8.0  HGB 8.3* 8.1* 7.5* 7.4* 8.6*  HCT 25.7* 26.1* 24.2* 24.4* 28.2*  PLT 333 324 314 286 298  MCV 87.4 88.8 89.3 89.7 89.5  MCH 28.2 27.6 27.7 27.2 27.3  MCHC 32.3 31.0 31.0 30.3 30.5  RDW 15.5 15.7* 15.7* 15.4 15.1  LYMPHSABS  --  1.3  --   --   --   MONOABS  --  1.0  --   --   --   EOSABS  --  0.1  --   --   --   BASOSABS  --  0.0  --   --   --     Complete metabolic panel:  Recent Labs  Lab 08/15/20 0414 08/16/20 0142 08/17/20 0234 08/18/20 0100 08/19/20 0309  NA 131* 132* 132* 136 137  K 4.3 3.9 3.8 3.8 3.9  CL 100 100 99 102 101  CO2 21* '26 24 24 29  '$ GLUCOSE 91 99 92 87 81  BUN 68* 33* 40* 47* 17  CREATININE 6.11* 4.10* 4.96* 5.43* 2.90*  CALCIUM 8.1* 7.8* 7.9* 8.0* 8.4*  AST  --   --  13* 14* 17  ALT  --   --  '7 6 8  '$ ALKPHOS  --   --  52 51 62  BILITOT  --   --  1.0 0.9 0.8  ALBUMIN 1.9* 1.9* 1.9* 1.9* 2.2*  MG  --   --  2.2 2.3 2.1  INR 2.7* 2.5* 2.1* 1.8* 1.7*    No results for input(s): LIPASE, AMYLASE  in the last 168 hours.  No results for input(s): CRP, DDIMER, BNP, PROCALCITON, SARSCOV2NAA in the last 168 hours.  Invalid input(s): LACTICACID  ------------------------------------------------------------------------------------------------------------------ No results for input(s): CHOL, HDL, LDLCALC, TRIG, CHOLHDL, LDLDIRECT in the last 72 hours.  Lab Results  Component Value Date   HGBA1C 5.7 (H) 07/25/2020   ------------------------------------------------------------------------------------------------------------------ No results  for input(s): TSH, T4TOTAL, T3FREE, THYROIDAB in the last 72 hours.  Invalid input(s): FREET3 ------------------------------------------------------------------------------------------------------------------ No results for input(s): VITAMINB12, FOLATE, FERRITIN, TIBC, IRON, RETICCTPCT in the last 72 hours.  Coagulation profile Recent Labs  Lab 08/15/20 0414 08/16/20 0142 08/17/20 0234 08/18/20 0100 08/19/20 0309  INR 2.7* 2.5* 2.1* 1.8* 1.7*   No results for input(s): DDIMER in the last 72 hours.  Cardiac Enzymes No results for input(s): CKTOTAL, CKMB, CKMBINDEX, TROPONINI in the last 168 hours.  ------------------------------------------------------------------------------------------------------------------    Component Value Date/Time   BNP 76.0 07/26/2020 0331     Antibiotics: Anti-infectives (From admission, onward)   Start     Dose/Rate Route Frequency Ordered Stop   08/13/20 2200  ceFEPIme (MAXIPIME) 2 g in sodium chloride 0.9 % 100 mL IVPB        2 g 200 mL/hr over 30 Minutes Intravenous Every 24 hours 08/13/20 1154     08/09/20 2200  ceFEPIme (MAXIPIME) 1 g in sodium chloride 0.9 % 100 mL IVPB  Status:  Discontinued        1 g 200 mL/hr over 30 Minutes Intravenous Every 24 hours 08/09/20 1238 08/13/20 1154   08/09/20 0000  ceFEPIme (MAXIPIME) 1 g in sodium chloride 0.9 % 100 mL IVPB  Status:  Discontinued        1 g 200  mL/hr over 30 Minutes Intravenous Every 24 hours 08/08/20 2305 08/09/20 1238       Radiology Reports  No results found.    DVT prophylaxis: Warfarin  Code Status: Full code  Family Communication: Discussed with patient's mother at bedside   Consultants:  Infectious disease  Procedures:      Objective    Physical Examination:    General-appears in no acute distress  Heart-S1-S2, regular, no murmur auscultated  Lungs-clear to auscultation bilaterally, no wheezing or crackles auscultated  Abdomen-soft, nontender, no organomegaly  Extremities-no edema in the lower extremities  Neuro-alert, oriented x3, no focal deficit noted   Status is: Inpatient  Dispo: The patient is from: Home              Anticipated d/c is to: Remain in hospital to complete 6 weeks of IV antibiotics              Anticipated d/c date is: To be decided              Patient currently not stable for discharge  Barrier to discharge-getting IV antibiotics in the hospital  COVID-19 Labs  No results for input(s): DDIMER, FERRITIN, LDH, CRP in the last 72 hours.  Lab Results  Component Value Date   [place] NEGATIVE 08/08/2020   [place] NEGATIVE 07/18/2020    Microbiology  No results found for this or any previous visit (from the past 240 hour(s)).  Pressure Injury 07/18/20 Coccyx Posterior;Medial Unstageable - Full thickness tissue loss in which the base of the injury is covered by slough (yellow, tan, gray, green or brown) and/or eschar (tan, brown or black) in the wound bed. (Active)  07/18/20 2200  Location: Coccyx  Location Orientation: Posterior;Medial  Staging: Unstageable - Full thickness tissue loss in which the base of the injury is covered by slough (yellow, tan, gray, green or brown) and/or eschar (tan, brown or black) in the wound bed.  Wound Description (Comments):   Present on Admission: Yes     Pressure Injury 07/18/20 Buttocks Right Unstageable - Full  thickness tissue loss in which the base of the injury is covered by  slough (yellow, tan, gray, green or brown) and/or eschar (tan, brown or black) in the wound bed. (Active)  07/18/20   Location: Buttocks  Location Orientation: Right  Staging: Unstageable - Full thickness tissue loss in which the base of the injury is covered by slough (yellow, tan, gray, green or brown) and/or eschar (tan, brown or black) in the wound bed.  Wound Description (Comments):   Present on Admission: Yes          [place]   [facility] If 7PM-7AM, please contact night-coverage at [email or web address], Office  [phone]   08/19/2020, 2:20 PM  LOS: 11 days

## 2020-08-20 DIAGNOSIS — R34 Anuria and oliguria: Secondary | ICD-10-CM | POA: Diagnosis not present

## 2020-08-20 DIAGNOSIS — N179 Acute kidney failure, unspecified: Secondary | ICD-10-CM | POA: Diagnosis not present

## 2020-08-20 DIAGNOSIS — I38 Endocarditis, valve unspecified: Secondary | ICD-10-CM | POA: Diagnosis not present

## 2020-08-20 LAB — COMPREHENSIVE METABOLIC PANEL
ALT: 7 U/L (ref 0–44)
AST: 16 U/L (ref 15–41)
Albumin: 2 g/dL — ABNORMAL LOW (ref 3.5–5.0)
Alkaline Phosphatase: 50 U/L (ref 38–126)
Anion gap: 7 (ref 5–15)
BUN: 29 mg/dL — ABNORMAL HIGH (ref 6–20)
CO2: 22 mmol/L (ref 22–32)
Calcium: 7.9 mg/dL — ABNORMAL LOW (ref 8.9–10.3)
Chloride: 106 mmol/L (ref 98–111)
Creatinine, Ser: 3.95 mg/dL — ABNORMAL HIGH (ref 0.61–1.24)
GFR, Estimated: 19 mL/min — ABNORMAL LOW (ref >60)
Glucose, Bld: 107 mg/dL — ABNORMAL HIGH (ref 70–99)
Potassium: 3.5 mmol/L (ref 3.5–5.1)
Sodium: 135 mmol/L (ref 135–145)
Total Bilirubin: 0.7 mg/dL (ref 0.3–1.2)
Total Protein: 6 g/dL — ABNORMAL LOW (ref 6.5–8.1)

## 2020-08-20 LAB — CBC
HCT: 24.2 % — ABNORMAL LOW (ref 39.0–52.0)
Hemoglobin: 7.3 g/dL — ABNORMAL LOW (ref 13.0–17.0)
MCH: 27.5 pg (ref 26.0–34.0)
MCHC: 30.2 g/dL (ref 30.0–36.0)
MCV: 91.3 fL (ref 80.0–100.0)
Platelets: 226 10*3/uL (ref 150–400)
RBC: 2.65 MIL/uL — ABNORMAL LOW (ref 4.22–5.81)
RDW: 15.3 % (ref 11.5–15.5)
WBC: 6.8 10*3/uL (ref 4.0–10.5)
nRBC: 0 % (ref 0.0–0.2)

## 2020-08-20 LAB — PHOSPHORUS: Phosphorus: 4.2 mg/dL (ref 2.5–4.6)

## 2020-08-20 LAB — MAGNESIUM: Magnesium: 1.9 mg/dL (ref 1.7–2.4)

## 2020-08-20 LAB — PROTIME-INR
INR: 2 — ABNORMAL HIGH (ref 0.8–1.2)
Prothrombin Time: 23 seconds — ABNORMAL HIGH (ref 11.4–15.2)

## 2020-08-20 LAB — HEPARIN LEVEL (UNFRACTIONATED)
Heparin Unfractionated: 0.13 IU/mL — ABNORMAL LOW (ref 0.30–0.70)
Heparin Unfractionated: 0.2 IU/mL — ABNORMAL LOW (ref 0.30–0.70)

## 2020-08-20 MED ORDER — SODIUM CHLORIDE 0.9 % IV SOLN: INTRAVENOUS | Status: AC

## 2020-08-20 MED ORDER — LACTULOSE 10 GM/15ML PO SOLN: 30.0000 g | Freq: Two times a day (BID) | ORAL | Status: DC | PRN

## 2020-08-20 MED ORDER — SODIUM CHLORIDE 0.9 % IV SOLN
1.0000 g | INTRAVENOUS | Status: DC
  Administered 2020-08-20 – 2020-08-24 (×5): 1 g via INTRAVENOUS
  Filled 2020-08-20 (×6): qty 1

## 2020-08-20 MED ORDER — WARFARIN SODIUM 5 MG PO TABS
5.0000 mg | ORAL_TABLET | Freq: Once | ORAL | Status: AC
  Administered 2020-08-20: 5 mg via ORAL
  Filled 2020-08-20: qty 1

## 2020-08-20 MED ORDER — CHLORHEXIDINE GLUCONATE CLOTH 2 % EX PADS: 6.0000 | MEDICATED_PAD | Freq: Every day | CUTANEOUS | Status: DC

## 2020-08-20 NOTE — Progress Notes (Addendum)
ANTICOAGULATION CONSULT NOTE - Follow Up Consult  Pharmacy Consult for Warfarin / Heparin Indication: mechanical aortic valve  Labs: Recent Labs    08/18/20 0100 08/18/20 1625 08/19/20 0309 08/19/20 1148 08/19/20 2028 08/20/20 0525  HGB 7.4*  --  8.6*  --   --  7.3*  HCT 24.4*  --  28.2*  --   --  24.2*  PLT 286  --  298  --   --  226  LABPROT 20.9*  --  20.2*  --   --  23.0*  INR 1.8*  --  1.7*  --   --  2.0*  HEPARINUNFRC  --    < > 0.11* <0.10* 0.14* 0.13*  CREATININE 5.43*  --  2.90*  --   --  3.95*   < > = values in this interval not displayed.   Assessment: 39yom with recent endocarditis> AV root repair and Mechanical AVR on 08/03/20. Warfarin started 5/13. Left AMA 5/16 pm then returned 5/17 pm. INR 2.6 on 5/18 and Warfarin resumed. Pharmacy was consulted to bridge warfarin with heparin until INR >2 again.   Heparin level continues to be subtherapeutic at 0.20 on 2600 units/hr but is trending up. INR still subtherapeutic at 2.0 but trending up. Hgb 7.3, PLTs stable. Discussed with RN, patient may have been silencing pump when there are infusion issues. Will be less aggressive with increase.   Goal of Therapy:  Heparin level 0.3-0.7 INR 2.5-3.5 per Dr. Kipp Brood Monitor platelets by anticoagulation protocol: Yes   Plan:  Increase heparin to 2800 units/hr Warfarin 5 mg x1 Heparin level in 6 hours Daily INR, heparin level, CBC   Romilda Garret, PharmD PGY1 Acute Care Pharmacy Resident 08/20/2020 9:09 AM  Please check [email or web address] for unit specific pharmacy phone numbers.

## 2020-08-20 NOTE — Progress Notes (Signed)
ANTICOAGULATION CONSULT NOTE - Follow Up Consult  Pharmacy Consult for Warfarin / Heparin Indication: mechanical aortic valve  Labs: Recent Labs    08/18/20 0100 08/18/20 1625 08/19/20 0309 08/19/20 1148 08/19/20 2028 08/20/20 0525  HGB 7.4*  --  8.6*  --   --  7.3*  HCT 24.4*  --  28.2*  --   --  24.2*  PLT 286  --  298  --   --  226  LABPROT 20.9*  --  20.2*  --   --   --   INR 1.8*  --  1.7*  --   --   --   HEPARINUNFRC  --    < > 0.11* <0.10* 0.14* 0.13*  CREATININE 5.43*  --  2.90*  --   --  3.95*   < > = values in this interval not displayed.   Assessment: 39yom with recent endocarditis> AV root repair and Mechanical AVR on 08/03/20. Warfarin started 5/13. Left AMA 5/16 pm then returned 5/17 pm. INR 2.6 on 5/18 and Warfarin resumed. Pharmacy was consulted to bridge warfarin with heparin until INR >2 again.   Heparin level remains subtherapeutic this am.  INR pending.  Hg down to 7.3, PTLC 226.  No issues noted with infusion  Goal of Therapy:  Heparin level 0.3-0.7 INR 2.5-3.5 per Dr. Kipp Brood Monitor platelets by anticoagulation protocol: Yes   Plan:  Increase heparin to 2600 units/hr Heparin level in 6 hours Daily INR, heparin level, CBC   Thanks for allowing pharmacy to be a part of this patient's care.  Excell Seltzer, PharmD Clinical Pharmacist 08/20/2020 6:47 AM

## 2020-08-20 NOTE — Progress Notes (Signed)
[facility]  PROGRESS NOTE  Alex [name] [id number] DOB: April 16, 1980 DOA: 08/08/2020 [facility]CP: [facility]   Brief HPI:   40 year old male with history of polysubstance abuse, aortic valve endocarditis, Serratia bacteremia, septic emboli causing stroke and acute kidney injury requiring hemodialysis.  Patient left hospital AMA on 08/08/2020.  He came back same night after discussion with CT surgery.   4/26 transfer from [facility] to [facility]  CT head 4/26 >> no acute findings  CT abd/pelvis 4/26 >> cholelithiasis w/o inflammatory chagnes, mild hepatomegaly (25 cm), moderate splenomegaly (16.7 cm), 2 mm non obstructing kidney stone on Lt  4/27 CVL placed. Pressors escalating.   4/28 intubated for TEE, CVC, HD, and A-line placed. CRRT started   Remains on CRRT  MRI on 4/30-multiple infarcts  5/4: repeat TEE shows an aortic valve abscess  5/5: extubated , self-removed HD cath so CRRT holiday> developed severe hyperkalemia overnight.  5/6: new temp HD cath placed   5/8: new temp HD cath placed after prior one self-removed by patient  5/9: Orthopantogram neg for abscess  5/10: HD cath replaced by IR [facility]  5/12 to OR for AVR, aortic root reconstruction, closure of aortic sinus track. To 2H ICU on vent post op. 4 units PRBC given intraop.   5/13 extubated, on CRRT, remains on pressors  5/18, left AMA.   Subjective   Patient seen and examined, denies any complaints.   Assessment/Plan:    1. Aortic valve endocarditis with perivalvular abscess-Serratia bacteremia, septic emboli causing strokes.  Patient presented with headaches and fever, developed hypotension, AKI, thrombocytopenia on admission on 4/26.  He was transferred to ICU, was intubated.  MRI brain on 07/22/2020 revealed multiple acute infarcts.  Blood cultures positive for Serratia bacteremia.  Echocardiogram showed severe AI and AR with AV vegetation confirmed by TEE.  He is s/p  mechanical aortic valve replacement, aortic root construction and closure of aortic sinus tract.  Patient needs 6 weeks of cefepime per ID with start date from 08/04/2020.  Continue Coumadin per pharmacy.  Keep INR between 2.5-3.5.  Patient left AMA on 08/07/2020 however came back to hospital after recommendation by CT surgery on 08/08/2020. 2. Acute kidney injury-likely from ATN from infection, hyponatremia, hypophosphatemia.  Nephrology was consulted.  Patient was initially placed on CRRT while he was in the ICU on 4/28.  Following which patient was placed on HD with attempted [place] catheter placement.  Patient received dialysis day before yesterday.  Today creatinine is stable at 4.95.  Nephrology following. 3. Anemia/thrombocytopenia-patient is s/p 6 units PRBC during last hospital stay.  Hemoglobin is stable.  Transfuse PRBC as needed for hemoglobin less than 7.  Hemoglobin is stable at  4. Polysubstance abuse-patient has amphetamine abuse, nicotine dependence, opiate dependence on agonist therapy.  He is on chronic Suboxone. 5. Bilateral lower extremity edema-likely from renal dysfunction as as well as chronic diastolic CHF.  Started on [employer]. 6. BMI-34.14kg/m   Scheduled medications:   . (feeding supplement) PROSource Plus  30 mL Oral BID BM  . buprenorphine-naloxone  1 tablet Sublingual BID  . Chlorhexidine Gluconate Cloth  6 each Topical Daily  . Chlorhexidine Gluconate Cloth  6 each Topical Q0600  . Chlorhexidine Gluconate Cloth  6 each Topical Q0600  . collagenase   Topical Daily  . darbepoetin (ARANESP) injection - NON-DIALYSIS  150 mcg Subcutaneous Q Sat-1800  . iron polysaccharides  150 mg Oral Daily  . lactulose  30 g Oral BID  .  lidocaine  1 patch Transdermal Q24H  . melatonin  3 mg Oral QHS  . methocarbamol  500 mg Oral TID  . nicotine  14 mg Transdermal Daily  . polyethylene glycol  17 g Oral Daily  . senna-docusate  1 tablet Oral BID  . warfarin  5 mg Oral ONCE-1600  .  Warfarin - Pharmacist Dosing Inpatient   Does not apply q1600         Data Reviewed:   CBG:  No results for input(s): GLUCAP in the last 168 hours.  SpO2: 98 %    Vitals:   08/19/20 1651 08/19/20 1945 08/20/20 0428 08/20/20 0826  BP: 137/85 130/87 124/80 136/89  Pulse: 98 95 84 86  Resp: '18 20 18 18  '$ Temp: 98.6 F (37 C) 98.5 F (36.9 C) 98.2 F (36.8 C) 98.4 F (36.9 C)  TempSrc: Oral Oral Oral Oral  SpO2: 98% 97% 99% 98%  Weight:   133.3 kg   Height:         Intake/Output Summary (Last 24 hours) at 08/20/2020 1403 Last data filed at 08/20/2020 1239 Gross per 24 hour  Intake 2592.34 ml  Output 2400 ml  Net 192.34 ml    05/27 1901 - 05/29 0700 In: 2996.5 [P.O.:1200; I.V.:1496.5] Out: 2900 [Urine:2900]  Filed Weights   08/18/20 0422 08/19/20 0251 08/20/20 0428  Weight: 129.8 kg 130.3 kg 133.3 kg    CBC:  Recent Labs  Lab 08/16/20 0941 08/17/20 0234 08/18/20 0100 08/19/20 0309 08/20/20 0525  WBC 8.3 8.3 8.4 8.0 6.8  HGB 8.1* 7.5* 7.4* 8.6* 7.3*  HCT 26.1* 24.2* 24.4* 28.2* 24.2*  PLT 324 314 286 298 226  MCV 88.8 89.3 89.7 89.5 91.3  MCH 27.6 27.7 27.2 27.3 27.5  MCHC 31.0 31.0 30.3 30.5 30.2  RDW 15.7* 15.7* 15.4 15.1 15.3  LYMPHSABS 1.3  --   --   --   --   MONOABS 1.0  --   --   --   --   EOSABS 0.1  --   --   --   --   BASOSABS 0.0  --   --   --   --     Complete metabolic panel:  Recent Labs  Lab 08/16/20 0142 08/17/20 0234 08/18/20 0100 08/19/20 0309 08/20/20 0525  NA 132* 132* 136 137 135  K 3.9 3.8 3.8 3.9 3.5  CL 100 99 102 101 106  CO2 '26 24 24 29 22  '$ GLUCOSE 99 92 87 81 107*  BUN 33* 40* 47* 17 29*  CREATININE 4.10* 4.96* 5.43* 2.90* 3.95*  CALCIUM 7.8* 7.9* 8.0* 8.4* 7.9*  AST  --  13* 14* 17 16  ALT  --  '7 6 8 7  '$ ALKPHOS  --  52 51 62 50  BILITOT  --  1.0 0.9 0.8 0.7  ALBUMIN 1.9* 1.9* 1.9* 2.2* 2.0*  MG  --  2.2 2.3 2.1 1.9  INR 2.5* 2.1* 1.8* 1.7* 2.0*    No results for input(s): LIPASE, AMYLASE in the last  168 hours.  No results for input(s): CRP, DDIMER, BNP, PROCALCITON, SARSCOV2NAA in the last 168 hours.  Invalid input(s): LACTICACID  ------------------------------------------------------------------------------------------------------------------ No results for input(s): CHOL, HDL, LDLCALC, TRIG, CHOLHDL, LDLDIRECT in the last 72 hours.  Lab Results  Component Value Date   HGBA1C 5.7 (H) 07/25/2020   ------------------------------------------------------------------------------------------------------------------ No results for input(s): TSH, T4TOTAL, T3FREE, THYROIDAB in the last 72 hours.  Invalid input(s): FREET3 ------------------------------------------------------------------------------------------------------------------ No results for input(s):  VITAMINB12, FOLATE, FERRITIN, TIBC, IRON, RETICCTPCT in the last 72 hours.  Coagulation profile Recent Labs  Lab 08/16/20 0142 08/17/20 0234 08/18/20 0100 08/19/20 0309 08/20/20 0525  INR 2.5* 2.1* 1.8* 1.7* 2.0*   No results for input(s): DDIMER in the last 72 hours.  Cardiac Enzymes No results for input(s): CKTOTAL, CKMB, CKMBINDEX, TROPONINI in the last 168 hours.  ------------------------------------------------------------------------------------------------------------------    Component Value Date/Time   BNP 76.0 07/26/2020 0331     Antibiotics: Anti-infectives (From admission, onward)   Start     Dose/Rate Route Frequency Ordered Stop   08/13/20 2200  ceFEPIme (MAXIPIME) 2 g in sodium chloride 0.9 % 100 mL IVPB        2 g 200 mL/hr over 30 Minutes Intravenous Every 24 hours 08/13/20 1154     08/09/20 2200  ceFEPIme (MAXIPIME) 1 g in sodium chloride 0.9 % 100 mL IVPB  Status:  Discontinued        1 g 200 mL/hr over 30 Minutes Intravenous Every 24 hours 08/09/20 1238 08/13/20 1154   08/09/20 0000  ceFEPIme (MAXIPIME) 1 g in sodium chloride 0.9 % 100 mL IVPB  Status:  Discontinued        1 g 200 mL/hr over 30  Minutes Intravenous Every 24 hours 08/08/20 2305 08/09/20 1238       Radiology Reports  No results found.    DVT prophylaxis: Warfarin  Code Status: Full code  Family Communication: Discussed with patient's mother at bedside   Consultants:  Infectious disease  Procedures:      Objective    Physical Examination:    General-appears in no acute distress  Heart-S1-S2, regular, no murmur auscultated  Lungs-clear to auscultation bilaterally, no wheezing or crackles auscultated  Abdomen-soft, nontender, no organomegaly  Extremities-no edema in the lower extremities  Neuro-alert, oriented x3, no focal deficit noted   Status is: Inpatient  Dispo: The patient is from: Home              Anticipated d/c is to: Remain in hospital to complete 6 weeks of IV antibiotics              Anticipated d/c date is: To be decided              Patient currently not stable for discharge  Barrier to discharge-getting IV antibiotics in the hospital  COVID-19 Labs  No results for input(s): DDIMER, FERRITIN, LDH, CRP in the last 72 hours.  Lab Results  Component Value Date   [place] NEGATIVE 08/08/2020   [place] NEGATIVE 07/18/2020    Microbiology  No results found for this or any previous visit (from the past 240 hour(s)).  Pressure Injury 07/18/20 Coccyx Posterior;Medial Unstageable - Full thickness tissue loss in which the base of the injury is covered by slough (yellow, tan, gray, green or brown) and/or eschar (tan, brown or black) in the wound bed. (Active)  07/18/20 2200  Location: Coccyx  Location Orientation: Posterior;Medial  Staging: Unstageable - Full thickness tissue loss in which the base of the injury is covered by slough (yellow, tan, gray, green or brown) and/or eschar (tan, brown or black) in the wound bed.  Wound Description (Comments):   Present on Admission: Yes     Pressure Injury 07/18/20 Buttocks Right Unstageable - Full thickness tissue  loss in which the base of the injury is covered by slough (yellow, tan, gray, green or brown) and/or eschar (tan, brown or black) in the wound bed. (Active)  07/18/20  Location: Buttocks  Location Orientation: Right  Staging: Unstageable - Full thickness tissue loss in which the base of the injury is covered by slough (yellow, tan, gray, green or brown) and/or eschar (tan, brown or black) in the wound bed.  Wound Description (Comments):   Present on Admission: Yes          [place]   [facility] If 7PM-7AM, please contact night-coverage at [email or web address], Office  [phone]   08/20/2020, 2:03 PM  LOS: 12 days

## 2020-08-20 NOTE — Progress Notes (Signed)
PHARMACY NOTE:  ANTIMICROBIAL RENAL DOSAGE ADJUSTMENT  Current antimicrobial regimen includes a mismatch between antimicrobial dosage and estimated renal function.  As per policy approved by the Pharmacy & Therapeutics and Medical Executive Committees, the antimicrobial dosage will be adjusted accordingly.  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 Nephrology is hopeful for renal recovery. Some UOP charted which has remained consistent.   Estimated Creatinine Clearance: 38.2 mL/min (A) (by C-G formula based on SCr of 3.95 mg/dL (H)). '[x]'$      On intermittent HD, scheduled: iHD, next most likely 5/30 '[]'$      On CRRT    Antimicrobial dosage has been changed to:  Cefepime 1g IV every 24 hours  Additional comments: Will continue to monitor renal plans daily and adjust dose accordingly.   Romilda Garret, PharmD PGY1 Acute Care Pharmacy Resident 08/20/2020 3:21 PM  Please check [email or web address] for unit specific pharmacy phone numbers.

## 2020-08-20 NOTE — Progress Notes (Signed)
Nephrology progress note:   75M AV IE and perivalvular abscess, serrata sp.   AVR repair 5/12;  Anuric AKI from ATN-  Normal crt in Feb 2022 then 5 on 07/18/20 in the setting of IE  S:  Last HD on 5/27 late night; kept even.  He had 2.5 liters UOP over 5/28 with HD.  Feels good today.  We discussed risks/benefits/indications for transfusion and he does consent to blood and understands this is recommended if Hb drops further     Review of systems:  Denies shortness of breath or chest pain Denies n/v Urinating without difficulty  O: 05/28 0701 - 05/29 0700 In: 2693 [P.O.:1080; I.V.:1313; IV Piggyback:300] Out: 2500 [Urine:2500]   Vitals:   08/20/20 0428 08/20/20 0826  BP: 124/80 136/89  Pulse: 84 86  Resp: 18 18  Temp: 98.2 F (36.8 C) 98.4 F (36.9 C)  SpO2: 99% 98%   General adult male in bed in no acute distress   HEENT normocephalic atraumatic extraocular movements intact sclera anicteric Neck supple trachea midline Lungs clear to auscultation bilaterally unlabored room air  Heart mechanical click; normal rate; no rub Abdomen soft nontender nondistended Extremities trace to 1+ edema right leg; left leg with pedal edema; legs wrapped  Psych normal mood and affect Neuro alert and oriented and conversant Access: RIJ tunneled catheter   Filed Weights   08/18/20 0422 08/19/20 0251 08/20/20 0428  Weight: 129.8 kg 130.3 kg 133.3 kg    Recent Labs  Lab 08/18/20 0100 08/19/20 0309 08/20/20 0525  NA 136 137 135  K 3.8 3.9 3.5  CL 102 101 106  CO2 '24 29 22  '$ GLUCOSE 87 81 107*  BUN 47* 17 29*  CREATININE 5.43* 2.90* 3.95*  CALCIUM 8.0* 8.4* 7.9*  PHOS 6.4* 2.9 4.2   Recent Labs  Lab 08/16/20 0941 08/17/20 0234 08/18/20 0100 08/19/20 0309 08/20/20 0525  WBC 8.3   < > 8.4 8.0 6.8  NEUTROABS 5.9  --   --   --   --   HGB 8.1*   < > 7.4* 8.6* 7.3*  HCT 26.1*   < > 24.4* 28.2* 24.2*  MCV 88.8   < > 89.7 89.5 91.3  PLT 324   < > 286 298 226   < > = values in this  interval not displayed.    Scheduled Meds: . (feeding supplement) PROSource Plus  30 mL Oral BID BM  . buprenorphine-naloxone  1 tablet Sublingual BID  . Chlorhexidine Gluconate Cloth  6 each Topical Daily  . Chlorhexidine Gluconate Cloth  6 each Topical Q0600  . Chlorhexidine Gluconate Cloth  6 each Topical Q0600  . collagenase   Topical Daily  . darbepoetin (ARANESP) injection - NON-DIALYSIS  150 mcg Subcutaneous Q Sat-1800  . iron polysaccharides  150 mg Oral Daily  . lactulose  30 g Oral BID  . lidocaine  1 patch Transdermal Q24H  . melatonin  3 mg Oral QHS  . methocarbamol  500 mg Oral TID  . nicotine  14 mg Transdermal Daily  . polyethylene glycol  17 g Oral Daily  . senna-docusate  1 tablet Oral BID  . Warfarin - Pharmacist Dosing Inpatient   Does not apply q1600   Continuous Infusions: . ceFEPime (MAXIPIME) IV 200 mL/hr at 08/19/20 2349  . heparin 2,600 Units/hr (08/20/20 0654)   PRN Meds:.acetaminophen **OR** acetaminophen, bisacodyl, clonazePAM, ondansetron **OR** ondansetron (ZOFRAN) IV  ABG    Component Value Date/Time   PHART 7.405 08/03/2020  2033   PCO2ART 31.4 (L) 08/03/2020 2033   PO2ART 81 (L) 08/03/2020 2033   HCO3 19.6 (L) 08/03/2020 2033   TCO2 21 (L) 08/03/2020 2033   ACIDBASEDEF 4.0 (H) 08/03/2020 2033   O2SAT 96.0 08/03/2020 2033    A/P  1. Severe AKI - likely secondary to ATN related to infection and surgery.  S/p CRRT then transitioned to hemodialysis on 5/16.  Has required HD for clearance but is nonoliguric 1. Hold HD today and continue to monitor inpatient. We have been assessing needs daily.  Plan for next HD on 5/30 and then monitor for additional dialysis needs.     2. NS at 75/hr x 12 hours.  Albumin is 2.0.  Continue leg wraps 2. 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  Management per primary team.  Pharmacy following for cefepime dosing 3. VDRF, resolved   4. Anemia normocytic - Aranesp 150 mcg weekly on Sat. Defer IV iron with bacteremia.  On iron polysaccharide 150 mg daily.  Would transfuse if Hb drops further to optimize renal recovery   5. Hyponatremia: Likely related to volume excess.  Resolved with HD.  6. Hyperphosphatemia: improved with HD.  Started renvela 5/24 and later discontinued as phos 2.9. renal diet  7. Constipation: improved; please avoid mag citrate and fleet's enemas  Disposition - no outpatient HD unit and still requiring iHD.  States he was told he would be here several weeks for IV abx.   Claudia Desanctis, MD 08/20/2020 9:04 AM

## 2020-08-21 DIAGNOSIS — I330 Acute and subacute infective endocarditis: Secondary | ICD-10-CM | POA: Diagnosis not present

## 2020-08-21 DIAGNOSIS — F1190 Opioid use, unspecified, uncomplicated: Secondary | ICD-10-CM | POA: Diagnosis not present

## 2020-08-21 DIAGNOSIS — N170 Acute kidney failure with tubular necrosis: Secondary | ICD-10-CM

## 2020-08-21 DIAGNOSIS — I63119 Cerebral infarction due to embolism of unspecified vertebral artery: Secondary | ICD-10-CM | POA: Diagnosis not present

## 2020-08-21 LAB — CBC
HCT: 24.7 % — ABNORMAL LOW (ref 39.0–52.0)
Hemoglobin: 7.4 g/dL — ABNORMAL LOW (ref 13.0–17.0)
MCH: 27.2 pg (ref 26.0–34.0)
MCHC: 30 g/dL (ref 30.0–36.0)
MCV: 90.8 fL (ref 80.0–100.0)
Platelets: 226 10*3/uL (ref 150–400)
RBC: 2.72 MIL/uL — ABNORMAL LOW (ref 4.22–5.81)
RDW: 15.2 % (ref 11.5–15.5)
WBC: 7.7 10*3/uL (ref 4.0–10.5)
nRBC: 0 % (ref 0.0–0.2)

## 2020-08-21 LAB — COMPREHENSIVE METABOLIC PANEL
ALT: 9 U/L (ref 0–44)
AST: 16 U/L (ref 15–41)
Albumin: 2 g/dL — ABNORMAL LOW (ref 3.5–5.0)
Alkaline Phosphatase: 53 U/L (ref 38–126)
Anion gap: 5 (ref 5–15)
BUN: 33 mg/dL — ABNORMAL HIGH (ref 6–20)
CO2: 25 mmol/L (ref 22–32)
Calcium: 8 mg/dL — ABNORMAL LOW (ref 8.9–10.3)
Chloride: 107 mmol/L (ref 98–111)
Creatinine, Ser: 4.44 mg/dL — ABNORMAL HIGH (ref 0.61–1.24)
GFR, Estimated: 17 mL/min — ABNORMAL LOW (ref >60)
Glucose, Bld: 100 mg/dL — ABNORMAL HIGH (ref 70–99)
Potassium: 3.6 mmol/L (ref 3.5–5.1)
Sodium: 137 mmol/L (ref 135–145)
Total Bilirubin: 0.5 mg/dL (ref 0.3–1.2)
Total Protein: 5.9 g/dL — ABNORMAL LOW (ref 6.5–8.1)

## 2020-08-21 LAB — HEPARIN LEVEL (UNFRACTIONATED)
Heparin Unfractionated: 0.21 IU/mL — ABNORMAL LOW (ref 0.30–0.70)
Heparin Unfractionated: 0.2 IU/mL — ABNORMAL LOW (ref 0.30–0.70)
Heparin Unfractionated: 0.34 IU/mL (ref 0.30–0.70)

## 2020-08-21 LAB — PROTIME-INR
INR: 2 — ABNORMAL HIGH (ref 0.8–1.2)
Prothrombin Time: 22.8 seconds — ABNORMAL HIGH (ref 11.4–15.2)

## 2020-08-21 LAB — PHOSPHORUS: Phosphorus: 4.3 mg/dL (ref 2.5–4.6)

## 2020-08-21 LAB — MAGNESIUM: Magnesium: 1.9 mg/dL (ref 1.7–2.4)

## 2020-08-21 MED ORDER — WARFARIN SODIUM 7.5 MG PO TABS
7.5000 mg | ORAL_TABLET | Freq: Once | ORAL | Status: AC
  Administered 2020-08-21: 7.5 mg via ORAL
  Filled 2020-08-21: qty 1

## 2020-08-21 NOTE — Progress Notes (Signed)
[facility] PROGRESS NOTE  Alex [name] [id number] DOB: 07/06/1980 DOA: 08/08/2020 PCP: [facility]  Status:  Remains inpatient appropriate because:Unsafe d/c plan, IV treatments appropriate due to intensity of illness or inability to take PO and Inpatient level of care appropriate due to severity of illness   Dispo: The patient is from: Home              Anticipated d/c is to: Home              Patient currently is not medically stable to d/c.   Difficult to place patient Yes   Level of care: Med-Surg  Code Status: Full Family Communication:  DVT prophylaxis: Heparin infusion COVID vaccination status: Received first dose of Pfizer on 08/16/2020  HPI: 40 year old male with history of polysubstance abuse, aortic valve endocarditis, Serratia bacteremia, septic emboli causing stroke and acute kidney injury requiring hemodialysis.  Patient left hospital AMA on 08/08/2020.  He came back same night after discussion with CT surgery.  Significant events:  4/26 transfer from [facility] to [facility]  CT head 4/26 >> no acute findings  CT abd/pelvis 4/26 >> cholelithiasis w/o inflammatory chagnes, mild hepatomegaly (25 cm), moderate splenomegaly (16.7 cm), 2 mm non obstructing kidney stone on Lt  4/27 CVL placed. Pressors escalating.   4/28 intubated for TEE, CVC, HD, and A-line placed. CRRT started   Remains on CRRT  MRI on 4/30-multiple infarcts  5/4: repeat TEE shows an aortic valve abscess  5/5: extubated , self-removed HD cath so CRRT holiday> developed severe hyperkalemia overnight.  5/6: new temp HD cath placed   5/8: new temp HD cath placed after prior one self-removed by patient  5/9: Orthopantogram neg for abscess  5/10: HD cath replaced by IR [facility]  5/12 to OR for AVR, aortic root reconstruction, closure of aortic sinus track. To 2H ICU on vent post op. 4 units PRBC given intraop.   5/13 extubated, on CRRT, remains on  pressors  5/18, left AMA.  5/30 continues on intermittent hemodialysis as directed by nephrology team.  Continues with excellent urinary output.  Creatinine between 2.9 and 4.5  Subjective: Awake and from sleep.  Its had dialysis 1 time over the weekend.  Encouraged by excellent urinary output.  Objective: Vitals:   08/21/20 0410 08/21/20 0743  BP: (!) 141/91 (!) 136/92  Pulse: 85 86  Resp: 15 15  Temp: 98.5 F (36.9 C) 98 F (36.7 C)  SpO2: 99% 99%    Intake/Output Summary (Last 24 hours) at 08/21/2020 0809 Last data filed at 08/21/2020 0700 Gross per 24 hour  Intake 2297.62 ml  Output 1800 ml  Net 497.62 ml   Filed Weights   08/19/20 0251 08/20/20 0428 08/21/20 0410  Weight: 130.3 kg 133.3 kg 133 kg    Exam:  Constitutional: Awake, calm, no acute distress Respiratory: Lungs are clear, stable on room air Cardiovascular: Normal heart sounds, significant improvement in peripheral edema, Ace wraps remain in place.  Pulses are palpable Abdomen: Obese, soft, nontender nondistended with normoactive bowel sounds. LBM 5/29 Genitourinary: Voids easily Neurologic: CN 2-12 grossly intact. Sensation intact, DTR normal. Strength 5/5 x all 4 extremities.  Psychiatric: Alert and oriented x3.  Pleasant affect.   Assessment/Plan: Acute problems: Aortic valve endocarditis with perivalvular abscess-Serratia bacteremia, septic emboli causing strokes.   -Presented with abscess physiology on 4/26 eventually was transferred to ICU, was intubated.   -MRI brain on 07/22/2020 revealed multiple acute infarcts.   -Blood cultures  positive for Serratia and Echocardiogram showed severe AI and AR with AV vegetation confirmed by TEE.   -Now s/p mechanical aortic valve replacement, aortic root construction and closure of aortic sinus tract.   -Needs 6 weeks of cefepime per ID with start date from 08/04/2020. -Continue Coumadin per pharmacy.    -Patient left AMA on 08/07/2020 however came back to  hospital after speaking with CT surgery on 08/08/2020.  Acute kidney injury -likely from ATN from infection -Nephrology following  -Initially required CRRT and currently is on intermittent hemodialysis with plans to dialyze today on 5/30 then monitor off dialysis for 1 week -UOP averaging about 2 L per 24-hour  Anemia/thrombocytopenia -patient is s/p 6 units PRBC during last hospital stay.   -Transfuse PRBC as needed for hemoglobin less than 7. -Hemoglobin is greater than 7.0  Polysubstance abuse -patient has amphetamine abuse, nicotine dependence, opiate dependence on agonist therapy.  He is on chronic Suboxone.  Bilateral lower extremity edema -Markedly improved  -Continue on Unna boots.  Obesity BMI-34.14kg/m    Data Reviewed: Basic Metabolic Panel: Recent Labs  Lab 08/17/20 0234 08/18/20 0100 08/19/20 0309 08/20/20 0525 08/20/20 2327  NA 132* 136 137 135 137  K 3.8 3.8 3.9 3.5 3.6  CL 99 102 101 106 107  CO2 '24 24 29 22 25  '$ GLUCOSE 92 87 81 107* 100*  BUN 40* 47* 17 29* 33*  CREATININE 4.96* 5.43* 2.90* 3.95* 4.44*  CALCIUM 7.9* 8.0* 8.4* 7.9* 8.0*  MG 2.2 2.3 2.1 1.9 1.9  PHOS 6.4* 6.4* 2.9 4.2 4.3   Liver Function Tests: Recent Labs  Lab 08/17/20 0234 08/18/20 0100 08/19/20 0309 08/20/20 0525 08/20/20 2327  AST 13* 14* '17 16 16  '$ ALT '7 6 8 7 9  '$ ALKPHOS 52 51 62 50 53  BILITOT 1.0 0.9 0.8 0.7 0.5  PROT 5.8* 6.0* 6.7 6.0* 5.9*  ALBUMIN 1.9* 1.9* 2.2* 2.0* 2.0*     CBC: Recent Labs  Lab 08/16/20 0941 08/17/20 0234 08/18/20 0100 08/19/20 0309 08/20/20 0525 08/20/20 2327  WBC 8.3 8.3 8.4 8.0 6.8 7.7  NEUTROABS 5.9  --   --   --   --   --   HGB 8.1* 7.5* 7.4* 8.6* 7.3* 7.4*  HCT 26.1* 24.2* 24.4* 28.2* 24.2* 24.7*  MCV 88.8 89.3 89.7 89.5 91.3 90.8  PLT 324 314 286 298 226 226    BNP (last 3 results) Recent Labs    07/25/20 0334 07/26/20 0331  BNP 153.5* 76.0     Continuous Infusions: . ceFEPime (MAXIPIME) IV 1 g (08/20/20 2143)   . heparin 2,900 Units/hr (08/21/20 0029)    Principal Problem:   Endocarditis Active Problems:   Opioid use disorder   Acute renal failure with oliguria (HCC)   Cerebral embolism with cerebral infarction   Anemia   Consultants:  Infectious disease  PCCM  Neurology  CT surgery  Procedures:  Echocardiogram  TEE  CRRT  Cortrack  Antibiotics: Anti-infectives (From admission, onward)   Start     Dose/Rate Route Frequency Ordered Stop   08/20/20 2200  ceFEPIme (MAXIPIME) 1 g in sodium chloride 0.9 % 100 mL IVPB        1 g 200 mL/hr over 30 Minutes Intravenous Every 24 hours 08/20/20 1523     08/13/20 2200  ceFEPIme (MAXIPIME) 2 g in sodium chloride 0.9 % 100 mL IVPB  Status:  Discontinued        2 g 200 mL/hr over 30 Minutes Intravenous  Every 24 hours 08/13/20 1154 08/20/20 1523   08/09/20 2200  ceFEPIme (MAXIPIME) 1 g in sodium chloride 0.9 % 100 mL IVPB  Status:  Discontinued        1 g 200 mL/hr over 30 Minutes Intravenous Every 24 hours 08/09/20 1238 08/13/20 1154   08/09/20 0000  ceFEPIme (MAXIPIME) 1 g in sodium chloride 0.9 % 100 mL IVPB  Status:  Discontinued        1 g 200 mL/hr over 30 Minutes Intravenous Every 24 hours 08/08/20 2305 08/09/20 1238       Time spent: 25 minutes    Erin Hearing ANP  [facility] 7 am - 330 pm/M-F for direct patient care and secure chat Please refer to Amion for contact info 13  days

## 2020-08-21 NOTE — Progress Notes (Signed)
ANTICOAGULATION CONSULT NOTE - Follow Up Consult  Pharmacy Consult for Warfarin / Heparin Indication: mechanical aortic valve  Labs: Recent Labs    08/19/20 0309 08/19/20 1148 08/20/20 0525 08/20/20 1415 08/20/20 2327 08/21/20 0704 08/21/20 1655  HGB 8.6*  --  7.3*  --  7.4*  --   --   HCT 28.2*  --  24.2*  --  24.7*  --   --   PLT 298  --  226  --  226  --   --   LABPROT 20.2*  --  23.0*  --  22.8*  --   --   INR 1.7*  --  2.0*  --  2.0*  --   --   HEPARINUNFRC 0.11*   < > 0.13*   < > 0.21* 0.20* 0.34  CREATININE 2.90*  --  3.95*  --  4.44*  --   --    < > = values in this interval not displayed.   Assessment: 39yom with recent endocarditis> AV root repair and Mechanical AVR on 08/03/20. Warfarin started 5/13. Left AMA 5/16 pm then returned 5/17 pm. INR 2.6 on 5/18 and Warfarin resumed. Pharmacy was consulted to bridge warfarin with heparin until INR >2.5 again.   Heparin level is now therapeutic at 0.34 on 2,900 units/hr. Of note, patient might be silencing pump when there are infusion issues. Per RN, patient likely unhooked his own IV for an unknown length of time earlier today. Will follow up with confirmatory heparin level since this is the first HL in goal range.   Goal of Therapy:  Heparin level 0.3-0.7 INR 2.5-3.5 per Dr. Kipp Brood Monitor platelets by anticoagulation protocol: Yes   Plan:  Continue IV heparin at 2,900 units/hr Follow up with confirmatory HL in 6 hours then daily Follow daily INR, CBC, s/sx bleeding  Mercy Riding, PharmD PGY1 Acute Care Pharmacy Resident Please refer to [facility] for unit-specific pharmacist

## 2020-08-21 NOTE — Progress Notes (Signed)
ANTICOAGULATION CONSULT NOTE - Follow Up Consult  Pharmacy Consult for Warfarin / Heparin Indication: mechanical aortic valve  Labs: Recent Labs    08/19/20 0309 08/19/20 1148 08/20/20 0525 08/20/20 1415 08/20/20 2327  HGB 8.6*  --  7.3*  --  7.4*  HCT 28.2*  --  24.2*  --  24.7*  PLT 298  --  226  --  226  LABPROT 20.2*  --  23.0*  --  22.8*  INR 1.7*  --  2.0*  --  2.0*  HEPARINUNFRC 0.11*   < > 0.13* 0.20* 0.21*  CREATININE 2.90*  --  3.95*  --  4.44*   < > = values in this interval not displayed.   Assessment: 39yom with recent endocarditis> AV root repair and Mechanical AVR on 08/03/20. Warfarin started 5/13. Left AMA 5/16 pm then returned 5/17 pm. INR 2.6 on 5/18 and Warfarin resumed. Pharmacy was consulted to bridge warfarin with heparin until INR >2 again.   Heparin level continues to be subtherapeutic at 0.21 units/ml  INR this am 2.0.  No bleeding reported  Goal of Therapy:  Heparin level 0.3-0.7 INR 2.5-3.5 per Dr. Kipp Brood Monitor platelets by anticoagulation protocol: Yes   Plan:  Increase heparin to 2900 units/hr Daily INR, heparin level, CBC   Thanks for allowing pharmacy to be a part of this patient's care.  Excell Seltzer, PharmD Clinical Pharmacist 08/21/2020 12:29 AM  Please check [email or web address] for unit specific pharmacy phone numbers.

## 2020-08-21 NOTE — Progress Notes (Signed)
Patient ID: Alex [name], male   DOB: March 10, 1981, 40 y.o.   MRN: [id number] [facility] Progress Note   Assessment/ Plan:   1. Acute kidney Injury: With dense ATN related to SBE/postoperative course.  Earlier on CRRT and then transition to hemodialysis starting 5/16.  He is nonoliguric with 2 L UOP charted from overnight however, lagging renal clearance/recovery.  On schedule for hemodialysis today and then my plan is to watch him for the rest of the week for acute indications for dialysis.  He does not need any intravenous fluids at this time. 2.  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 3.  Anemia: Likely secondary to acute/critical illness and perioperative losses.  On Aranesp/iron supplementation with plans for PRBC transfusion today. 4.  Hypertension: Blood pressure under decent control, continue to monitor with UF/HD.  Subjective:   Reports to be feeling fair and disappointed that creatinine rose overnight.  Informs me that he has been urinating frequently.   Objective:   BP (!) 136/92 (BP Location: Left Arm)   Pulse 86   Temp 98 F (36.7 C) (Oral)   Resp 15   Ht '6\' 4"'$  (1.93 m)   Wt 133 kg   SpO2 99%   BMI 35.69 kg/m   Intake/Output Summary (Last 24 hours) at 08/21/2020 1040 Last data filed at 08/21/2020 0700 Gross per 24 hour  Intake 2297.62 ml  Output 1400 ml  Net 897.62 ml   Weight change: -0.267 kg  Physical Exam: Gen: Comfortably sitting up on the edge of his bed, mother at bedside. CVS: Pulse regular rhythm, normal rate, mechanical click audible over the precordium. Resp: Clear to auscultation bilaterally, no rales/rhonchi.  Right IJ TDC in place Abd: Soft, obese, nontender, bowel sounds normal Ext: Trace to 1+ edema right leg with trace left ankle edema.  Imaging: No results found.  Labs: BMET Recent Labs  Lab 08/15/20 0414  08/16/20 0142 08/17/20 0234 08/18/20 0100 08/19/20 0309 08/20/20 0525 08/20/20 2327  NA 131* 132* 132* 136 137 135 137  K 4.3 3.9 3.8 3.8 3.9 3.5 3.6  CL 100 100 99 102 101 106 107  CO2 21* '26 24 24 29 22 25  '$ GLUCOSE 91 99 92 87 81 107* 100*  BUN 68* 33* 40* 47* 17 29* 33*  CREATININE 6.11* 4.10* 4.96* 5.43* 2.90* 3.95* 4.44*  CALCIUM 8.1* 7.8* 7.9* 8.0* 8.4* 7.9* 8.0*  PHOS 8.2* 5.3* 6.4* 6.4* 2.9 4.2 4.3   CBC Recent Labs  Lab 08/16/20 0941 08/17/20 0234 08/18/20 0100 08/19/20 0309 08/20/20 0525 08/20/20 2327  WBC 8.3   < > 8.4 8.0 6.8 7.7  NEUTROABS 5.9  --   --   --   --   --   HGB 8.1*   < > 7.4* 8.6* 7.3* 7.4*  HCT 26.1*   < > 24.4* 28.2* 24.2* 24.7*  MCV 88.8   < > 89.7 89.5 91.3 90.8  PLT 324   < > 286 298 226 226   < > = values in this interval not displayed.    Medications:    . (feeding supplement) PROSource Plus  30 mL Oral BID BM  . buprenorphine-naloxone  1 tablet Sublingual BID  . Chlorhexidine Gluconate Cloth  6 each Topical Daily  . Chlorhexidine Gluconate Cloth  6 each Topical Q0600  . Chlorhexidine Gluconate Cloth  6 each Topical Q0600  . Chlorhexidine Gluconate Cloth  6 each Topical Q0600  . collagenase   Topical Daily  . darbepoetin (ARANESP) injection - NON-DIALYSIS  150 mcg Subcutaneous Q Sat-1800  . iron polysaccharides  150 mg Oral Daily  . lidocaine  1 patch Transdermal Q24H  . melatonin  3 mg Oral QHS  . methocarbamol  500 mg Oral TID  . nicotine  14 mg Transdermal Daily  . polyethylene glycol  17 g Oral Daily  . senna-docusate  1 tablet Oral BID  . warfarin  7.5 mg Oral ONCE-1600  . Warfarin - Pharmacist Dosing Inpatient   Does not apply q1600   Elmarie Shiley, MD 08/21/2020, 10:40 AM

## 2020-08-21 NOTE — Progress Notes (Addendum)
ANTICOAGULATION CONSULT NOTE - Follow Up Consult  Pharmacy Consult for Warfarin / Heparin Indication: mechanical aortic valve  Labs: Recent Labs    08/19/20 0309 08/19/20 1148 08/20/20 0525 08/20/20 1415 08/20/20 2327 08/21/20 0704  HGB 8.6*  --  7.3*  --  7.4*  --   HCT 28.2*  --  24.2*  --  24.7*  --   PLT 298  --  226  --  226  --   LABPROT 20.2*  --  23.0*  --  22.8*  --   INR 1.7*  --  2.0*  --  2.0*  --   HEPARINUNFRC 0.11*   < > 0.13* 0.20* 0.21* 0.20*  CREATININE 2.90*  --  3.95*  --  4.44*  --    < > = values in this interval not displayed.   Assessment: 39yom with recent endocarditis> AV root repair and Mechanical AVR on 08/03/20. Warfarin started 5/13. Left AMA 5/16 pm then returned 5/17 pm. INR 2.6 on 5/18 and Warfarin resumed. Pharmacy was consulted to bridge warfarin with heparin until INR >2.5 again.   Heparin level continues to be subtherapeutic at 0.20 on 2900 units/hr. INR still subtherapeutic at 2.0 after 4 days of warfarin '5mg'$ . Hgb 7.4, PLTs stable. Discussed with RN, patient may have been silencing pump when there are infusion issues. Per RN, patient likely unhooked his own IV for an unknown length of time. Will recheck heparin level before increasing.   Goal of Therapy:  Heparin level 0.3-0.7 INR 2.5-3.5 per Dr. Kipp Brood Monitor platelets by anticoagulation protocol: Yes   Plan:  Continue heparin to 2900 units/hr Warfarin 7.5 mg x1 Heparin level in 6 hours Daily INR, heparin level, CBC   Norina Buzzard, PharmD PGY1 Pharmacy Resident 08/21/2020 10:29 AM  Please check [email or web address] for unit specific pharmacy phone numbers.

## 2020-08-21 NOTE — Progress Notes (Addendum)
Removed bilateral unna boots for patient to wash up and care for itching legs.  Ortho aware and will come and replace. Removed dressing from left buttock. Area bled briefly, santyl, gauze and foam dressing applied. Pt resting with call bell within reach.  Will continue to monitor.  Payton Emerald, RN

## 2020-08-21 NOTE — Progress Notes (Signed)
Orthopedic Tech Progress Note Patient Details:  Alex [name] 1981/03/09 [id number]  Ortho Devices Type of Ortho Device: Louretta Parma boot Ortho Device/Splint Location: BLE Ortho Device/Splint Interventions: Ordered,Application,Adjustment   Post Interventions Patient Tolerated: Well Instructions Provided: [facility] [place] 08/21/2020, 11:03 AM

## 2020-08-22 DIAGNOSIS — I6340 Cerebral infarction due to embolism of unspecified cerebral artery: Secondary | ICD-10-CM | POA: Diagnosis not present

## 2020-08-22 DIAGNOSIS — N179 Acute kidney failure, unspecified: Secondary | ICD-10-CM | POA: Diagnosis not present

## 2020-08-22 DIAGNOSIS — I483 Typical atrial flutter: Secondary | ICD-10-CM

## 2020-08-22 DIAGNOSIS — I38 Endocarditis, valve unspecified: Secondary | ICD-10-CM | POA: Diagnosis not present

## 2020-08-22 LAB — CBC
HCT: 26.5 % — ABNORMAL LOW (ref 39.0–52.0)
Hemoglobin: 8 g/dL — ABNORMAL LOW (ref 13.0–17.0)
MCH: 27 pg (ref 26.0–34.0)
MCHC: 30.2 g/dL (ref 30.0–36.0)
MCV: 89.5 fL (ref 80.0–100.0)
Platelets: 212 10*3/uL (ref 150–400)
RBC: 2.96 MIL/uL — ABNORMAL LOW (ref 4.22–5.81)
RDW: 14.8 % (ref 11.5–15.5)
WBC: 7.8 10*3/uL (ref 4.0–10.5)
nRBC: 0 % (ref 0.0–0.2)

## 2020-08-22 LAB — MAGNESIUM: Magnesium: 1.8 mg/dL (ref 1.7–2.4)

## 2020-08-22 LAB — PROTIME-INR
INR: 2.3 — ABNORMAL HIGH (ref 0.8–1.2)
Prothrombin Time: 24.9 seconds — ABNORMAL HIGH (ref 11.4–15.2)

## 2020-08-22 LAB — BASIC METABOLIC PANEL
Anion gap: 6 (ref 5–15)
BUN: 16 mg/dL (ref 6–20)
CO2: 27 mmol/L (ref 22–32)
Calcium: 8.5 mg/dL — ABNORMAL LOW (ref 8.9–10.3)
Chloride: 103 mmol/L (ref 98–111)
Creatinine, Ser: 2.81 mg/dL — ABNORMAL HIGH (ref 0.61–1.24)
GFR, Estimated: 29 mL/min — ABNORMAL LOW (ref >60)
Glucose, Bld: 116 mg/dL — ABNORMAL HIGH (ref 70–99)
Potassium: 3.4 mmol/L — ABNORMAL LOW (ref 3.5–5.1)
Sodium: 136 mmol/L (ref 135–145)

## 2020-08-22 LAB — PHOSPHORUS: Phosphorus: 2.4 mg/dL — ABNORMAL LOW (ref 2.5–4.6)

## 2020-08-22 LAB — HEPARIN LEVEL (UNFRACTIONATED)
Heparin Unfractionated: 0.19 IU/mL — ABNORMAL LOW (ref 0.30–0.70)
Heparin Unfractionated: 0.24 IU/mL — ABNORMAL LOW (ref 0.30–0.70)

## 2020-08-22 MED ORDER — POTASSIUM CHLORIDE CRYS ER 20 MEQ PO TBCR
20.0000 meq | EXTENDED_RELEASE_TABLET | Freq: Once | ORAL | Status: AC
  Administered 2020-08-22: 20 meq via ORAL
  Filled 2020-08-22: qty 1

## 2020-08-22 MED ORDER — WARFARIN SODIUM 7.5 MG PO TABS
7.5000 mg | ORAL_TABLET | Freq: Once | ORAL | Status: AC
  Administered 2020-08-22: 7.5 mg via ORAL
  Filled 2020-08-22: qty 1

## 2020-08-22 MED ORDER — DILTIAZEM HCL-DEXTROSE 125-5 MG/125ML-% IV SOLN (PREMIX)
5.0000 mg/h | INTRAVENOUS | Status: DC
  Administered 2020-08-22: 10 mg/h via INTRAVENOUS
  Administered 2020-08-22: 5 mg/h via INTRAVENOUS
  Administered 2020-08-22 (×2): 15 mg/h via INTRAVENOUS
  Administered 2020-08-23: 10 mg/h via INTRAVENOUS
  Administered 2020-08-23: 15 mg/h via INTRAVENOUS
  Filled 2020-08-22 (×7): qty 125

## 2020-08-22 NOTE — Progress Notes (Signed)
ANTICOAGULATION CONSULT NOTE - Follow Up Consult  Pharmacy Consult for Warfarin / Heparin Indication: mechanical aortic valve  Labs: Recent Labs    08/20/20 0525 08/20/20 1415 08/20/20 2327 08/21/20 0704 08/21/20 1655 08/22/20 0203 08/22/20 1028 08/22/20 2110  HGB 7.3*  --  7.4*  --   --  8.0*  --   --   HCT 24.2*  --  24.7*  --   --  26.5*  --   --   PLT 226  --  226  --   --  212  --   --   LABPROT 23.0*  --  22.8*  --   --  24.9*  --   --   INR 2.0*  --  2.0*  --   --  2.3*  --   --   HEPARINUNFRC 0.13*   < > 0.21*   < > 0.34  --  0.19* 0.24*  CREATININE 3.95*  --  4.44*  --   --   --  2.81*  --    < > = values in this interval not displayed.   Assessment: 39yom with recent endocarditis> AV root repair and Mechanical AVR on 08/03/20. Warfarin started 5/13. Left AMA 5/16 pm then returned 5/17 pm. INR 2.6 on 5/18 and Warfarin resumed. Pharmacy was consulted to bridge warfarin with heparin until INR >2.5 again.  -INR= 2.3, hg= 8 -heparin level= 0.19  HEPARIN DW (KG): 113.5   PM f/u > heparin level 0.24.  No known issues with IV infusion, no overt bleeding or complications noted.   Goal of Therapy:  Heparin level 0.3-0.7 INR 2.5-3.5 per Dr. Kipp Brood Monitor platelets by anticoagulation protocol: Yes   Plan:  Increase heparin to 3250/hr Warfarin 7.'5mg'$  x1 (dose increases 5/30) Daily heparin level, CBC and INR  [place] Crane, Roylene Reason, [facility] Clinical Pharmacist  08/22/2020 10:35 PM   [facility] pharmacy phone numbers are listed on [email or web address]

## 2020-08-22 NOTE — Progress Notes (Signed)
Patient ID: Alex [name], male   DOB: 19-Oct-1980, 40 y.o.   MRN: [id number] [facility] Progress Note   Assessment/ Plan:   1. Acute kidney Injury: With dense ATN related to SBE/postoperative course.  Earlier on CRRT and then transition to hemodialysis starting 5/16.  Underwent hemodialysis yesterday due to a lag in renal recovery/clearance in spite of robust urine output.  Overnight with 3 L urine output and I will continue to follow daily labs to make determinations regarding dialysis/removal of TDC. 2.  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 3.  Anemia: Likely secondary to acute/critical illness and perioperative losses.  On Aranesp/iron supplementation with plans for PRBC transfusion today. 4.  Hypertension: Blood pressure under decent control, continue to monitor with UF/HD. 5.  Atrial flutter with RVR: Anticipate need to start beta-blocker versus calcium channel blocker for rate control.  Subjective:   Had some dizziness this morning and noted to be tachycardic with a heart rate in the 130s (atrial flutter).   Objective:   BP (!) 141/107 (BP Location: Left Arm)   Pulse (!) 134   Temp 97.6 F (36.4 C) (Oral)   Resp 18   Ht '6\' 4"'$  (1.93 m)   Wt 135.3 kg   SpO2 97%   BMI 36.31 kg/m   Intake/Output Summary (Last 24 hours) at 08/22/2020 0835 Last data filed at 08/22/2020 0804 Gross per 24 hour  Intake 1556.86 ml  Output 3550 ml  Net -1993.14 ml   Weight change: 2.3 kg  Physical Exam: Gen: Appears comfortable resting flat in bed CVS: Pulse irregular tachycardia, mechanical click audible over the precordium. Resp: Clear to auscultation bilaterally, no rales/rhonchi.  Right IJ TDC in place Abd: Soft, obese, nontender, bowel sounds normal Ext: Trace to 1+ edema right leg with trace left ankle edema.  Imaging: No results found.  Labs: BMET Recent Labs   Lab 08/16/20 0142 08/17/20 0234 08/18/20 0100 08/19/20 0309 08/20/20 0525 08/20/20 2327 08/22/20 0203  NA 132* 132* 136 137 135 137  --   K 3.9 3.8 3.8 3.9 3.5 3.6  --   CL 100 99 102 101 106 107  --   CO2 '26 24 24 29 22 25  '$ --   GLUCOSE 99 92 87 81 107* 100*  --   BUN 33* 40* 47* 17 29* 33*  --   CREATININE 4.10* 4.96* 5.43* 2.90* 3.95* 4.44*  --   CALCIUM 7.8* 7.9* 8.0* 8.4* 7.9* 8.0*  --   PHOS 5.3* 6.4* 6.4* 2.9 4.2 4.3 2.4*   CBC Recent Labs  Lab 08/16/20 0941 08/17/20 0234 08/19/20 0309 08/20/20 0525 08/20/20 2327 08/22/20 0203  WBC 8.3   < > 8.0 6.8 7.7 7.8  NEUTROABS 5.9  --   --   --   --   --   HGB 8.1*   < > 8.6* 7.3* 7.4* 8.0*  HCT 26.1*   < > 28.2* 24.2* 24.7* 26.5*  MCV 88.8   < > 89.5 91.3 90.8 89.5  PLT 324   < > 298 226 226 212   < > = values in this interval not displayed.    Medications:    . (feeding supplement) PROSource Plus  30 mL Oral BID BM  . buprenorphine-naloxone  1 tablet Sublingual BID  . Chlorhexidine Gluconate Cloth  6 each Topical Daily  . Chlorhexidine Gluconate Cloth  6 each Topical Q0600  .  Chlorhexidine Gluconate Cloth  6 each Topical Q0600  . Chlorhexidine Gluconate Cloth  6 each Topical Q0600  . collagenase   Topical Daily  . darbepoetin (ARANESP) injection - NON-DIALYSIS  150 mcg Subcutaneous Q Sat-1800  . iron polysaccharides  150 mg Oral Daily  . lidocaine  1 patch Transdermal Q24H  . melatonin  3 mg Oral QHS  . methocarbamol  500 mg Oral TID  . nicotine  14 mg Transdermal Daily  . polyethylene glycol  17 g Oral Daily  . senna-docusate  1 tablet Oral BID  . Warfarin - Pharmacist Dosing Inpatient   Does not apply q1600   Elmarie Shiley, MD 08/22/2020, 8:35 AM

## 2020-08-22 NOTE — Progress Notes (Addendum)
ANTICOAGULATION CONSULT NOTE - Follow Up Consult  Pharmacy Consult for Warfarin / Heparin Indication: mechanical aortic valve  Labs: Recent Labs    08/20/20 0525 08/20/20 1415 08/20/20 2327 08/21/20 0704 08/21/20 1655 08/22/20 0203  HGB 7.3*  --  7.4*  --   --  8.0*  HCT 24.2*  --  24.7*  --   --  26.5*  PLT 226  --  226  --   --  212  LABPROT 23.0*  --  22.8*  --   --  24.9*  INR 2.0*  --  2.0*  --   --  2.3*  HEPARINUNFRC 0.13*   < > 0.21* 0.20* 0.34  --   CREATININE 3.95*  --  4.44*  --   --   --    < > = values in this interval not displayed.   Assessment: 39yom with recent endocarditis> AV root repair and Mechanical AVR on 08/03/20. Warfarin started 5/13. Left AMA 5/16 pm then returned 5/17 pm. INR 2.6 on 5/18 and Warfarin resumed. Pharmacy was consulted to bridge warfarin with heparin until INR >2.5 again.  -INR= 2.3, hg= 8 -heparin level= 0.19  HEPARIN DW (KG): 113.5   Goal of Therapy:  Heparin level 0.3-0.7 INR 2.5-3.5 per Dr. Kipp Brood Monitor platelets by anticoagulation protocol: Yes   Plan:  Increase heparin to 3150/hr Heparin level in 8 hours  Warfarin 7.'5mg'$  x1 (dose increases 5/30) Daily heparin level, CBC and INR  Hildred Laser, PharmD Clinical Pharmacist **Pharmacist phone directory can now be found on [email or web address] ([email or web address]).  Listed under [place].

## 2020-08-22 NOTE — Progress Notes (Signed)
Pt complained of dizziness when getting standing weight this morning. Pulse 136. EKG obtained and showed a.flutter. MD paged and at bedside to assess. Telemetry applied.  Clyde Canterbury, RN

## 2020-08-22 NOTE — Progress Notes (Signed)
[facility]  PROGRESS NOTE  Alex [name] [id number] DOB: 15-Mar-1981 DOA: 08/08/2020 PCP: [facility]   Brief HPI:   40 year old male with history of polysubstance abuse, aortic valve endocarditis, Serratia bacteremia, septic emboli causing stroke and acute kidney injury requiring hemodialysis.  Patient left hospital AMA on 08/08/2020.  He came back same night after discussion with CT surgery.   4/26 transfer from [facility] to [facility]  CT head 4/26 >> no acute findings  CT abd/pelvis 4/26 >> cholelithiasis w/o inflammatory chagnes, mild hepatomegaly (25 cm), moderate splenomegaly (16.7 cm), 2 mm non obstructing kidney stone on Lt  4/27 CVL placed. Pressors escalating.   4/28 intubated for TEE, CVC, HD, and A-line placed. CRRT started   Remains on CRRT  MRI on 4/30-multiple infarcts  5/4: repeat TEE shows an aortic valve abscess  5/5: extubated , self-removed HD cath so CRRT holiday> developed severe hyperkalemia overnight.  5/6: new temp HD cath placed   5/8: new temp HD cath placed after prior one self-removed by patient  5/9: Orthopantogram neg for abscess  5/10: HD cath replaced by IR [facility]  5/12 to OR for AVR, aortic root reconstruction, closure of aortic sinus track. To 2H ICU on vent post op. 4 units PRBC given intraop.   5/13 extubated, on CRRT, remains on pressors  5/18, left AMA.   Subjective   Patient seen and examined, went into atrial flutter this morning.   Assessment/Plan:    1. Aortic valve endocarditis with perivalvular abscess-Serratia bacteremia, septic emboli causing strokes.  Patient presented with headaches and fever, developed hypotension, AKI, thrombocytopenia on admission on 4/26.  He was transferred to ICU, was intubated.  MRI brain on 07/22/2020 revealed multiple acute infarcts.  Blood cultures positive for Serratia bacteremia.  Echocardiogram showed severe AI and AR with AV vegetation confirmed by TEE.   He is s/p mechanical aortic valve replacement, aortic root construction and closure of aortic sinus tract.  Patient needs 6 weeks of cefepime per ID with start date from 08/04/2020.  Continue Coumadin per pharmacy.  Keep INR between 2.5-3.5.  Patient left AMA on 08/07/2020 however came back to hospital after recommendation by CT surgery on 08/08/2020. 2. New onset atrial flutter-heart rate in 140s.  Patient is already on anticoagulation with heparin.  Start Cardizem IV infusion.  Will consult cardiology for possible electrical or chemical cardioversion. 3. Hypokalemia-potassium is 3.4.  Will replace potassium and follow BMP in am. 4. Acute kidney injury-likely from ATN from infection, hyponatremia, hypophosphatemia.  Nephrology was consulted.  Patient was initially placed on CRRT while he was in the ICU on 4/28.  Following which patient was placed on HD with attempted [facility] catheter placement.  Patient received dialysis day before yesterday.  Today creatinine is stable at 4.95.  Nephrology following. 5. Anemia/thrombocytopenia-patient is s/p 6 units PRBC during last hospital stay.  Hemoglobin is stable.  Transfuse PRBC as needed for hemoglobin less than 7.  Hemoglobin is stable at  6. Polysubstance abuse-patient has amphetamine abuse, nicotine dependence, opiate dependence on agonist therapy.  He is on chronic Suboxone. 7. Bilateral lower extremity edema-likely from renal dysfunction as as well as chronic diastolic CHF.  Started on [employer]. 8. BMI-34.14kg/m   Scheduled medications:   . (feeding supplement) PROSource Plus  30 mL Oral BID BM  . buprenorphine-naloxone  1 tablet Sublingual BID  . Chlorhexidine Gluconate Cloth  6 each Topical Daily  . Chlorhexidine Gluconate Cloth  6 each Topical Q0600  .  Chlorhexidine Gluconate Cloth  6 each Topical Q0600  . Chlorhexidine Gluconate Cloth  6 each Topical Q0600  . collagenase   Topical Daily  . darbepoetin (ARANESP) injection - NON-DIALYSIS  150 mcg  Subcutaneous Q Sat-1800  . iron polysaccharides  150 mg Oral Daily  . lidocaine  1 patch Transdermal Q24H  . melatonin  3 mg Oral QHS  . methocarbamol  500 mg Oral TID  . nicotine  14 mg Transdermal Daily  . polyethylene glycol  17 g Oral Daily  . potassium chloride  20 mEq Oral Once  . senna-docusate  1 tablet Oral BID  . warfarin  7.5 mg Oral ONCE-1600  . Warfarin - Pharmacist Dosing Inpatient   Does not apply q1600         Data Reviewed:   CBG:  No results for input(s): GLUCAP in the last 168 hours.  SpO2: 98 %    Vitals:   08/22/20 0855 08/22/20 0937 08/22/20 1028 08/22/20 1144  BP: (!) 139/102 (!) 138/95 135/90 (!) 144/99  Pulse:  (!) 142 (!) 138 (!) 134  Resp: '20 16 20 18  '$ Temp:  98.7 F (37.1 C)  98.2 F (36.8 C)  TempSrc:  Oral  Oral  SpO2:  98% 100% 98%  Weight:      Height:         Intake/Output Summary (Last 24 hours) at 08/22/2020 1205 Last data filed at 08/22/2020 0900 Gross per 24 hour  Intake 1196.86 ml  Output 3825 ml  Net -2628.14 ml    05/29 1901 - 05/31 0700 In: 3254.5 [P.O.:1080; I.V.:2174.5] Out: 4700 [Urine:4700]  Filed Weights   08/20/20 0428 08/21/20 0410 08/22/20 0639  Weight: 133.3 kg 133 kg 135.3 kg    CBC:  Recent Labs  Lab 08/16/20 0941 08/17/20 0234 08/18/20 0100 08/19/20 0309 08/20/20 0525 08/20/20 2327 08/22/20 0203  WBC 8.3   < > 8.4 8.0 6.8 7.7 7.8  HGB 8.1*   < > 7.4* 8.6* 7.3* 7.4* 8.0*  HCT 26.1*   < > 24.4* 28.2* 24.2* 24.7* 26.5*  PLT 324   < > 286 298 226 226 212  MCV 88.8   < > 89.7 89.5 91.3 90.8 89.5  MCH 27.6   < > 27.2 27.3 27.5 27.2 27.0  MCHC 31.0   < > 30.3 30.5 30.2 30.0 30.2  RDW 15.7*   < > 15.4 15.1 15.3 15.2 14.8  LYMPHSABS 1.3  --   --   --   --   --   --   MONOABS 1.0  --   --   --   --   --   --   EOSABS 0.1  --   --   --   --   --   --   BASOSABS 0.0  --   --   --   --   --   --    < > = values in this interval not displayed.    Complete metabolic panel:  Recent Labs  Lab  08/17/20 0234 08/18/20 0100 08/19/20 0309 08/20/20 0525 08/20/20 2327 08/22/20 0203 08/22/20 1028  NA 132* 136 137 135 137  --  136  K 3.8 3.8 3.9 3.5 3.6  --  3.4*  CL 99 102 101 106 107  --  103  CO2 '24 24 29 22 25  '$ --  27  GLUCOSE 92 87 81 107* 100*  --  116*  BUN 40* 47* 17 29* 33*  --  16  CREATININE 4.96* 5.43* 2.90* 3.95* 4.44*  --  2.81*  CALCIUM 7.9* 8.0* 8.4* 7.9* 8.0*  --  8.5*  AST 13* 14* '17 16 16  '$ --   --   ALT '7 6 8 7 9  '$ --   --   ALKPHOS 52 51 62 50 53  --   --   BILITOT 1.0 0.9 0.8 0.7 0.5  --   --   ALBUMIN 1.9* 1.9* 2.2* 2.0* 2.0*  --   --   MG 2.2 2.3 2.1 1.9 1.9 1.8  --   INR 2.1* 1.8* 1.7* 2.0* 2.0* 2.3*  --     No results for input(s): LIPASE, AMYLASE in the last 168 hours.  No results for input(s): CRP, DDIMER, BNP, PROCALCITON, SARSCOV2NAA in the last 168 hours.  Invalid input(s): LACTICACID  ------------------------------------------------------------------------------------------------------------------ No results for input(s): CHOL, HDL, LDLCALC, TRIG, CHOLHDL, LDLDIRECT in the last 72 hours.  Lab Results  Component Value Date   HGBA1C 5.7 (H) 07/25/2020   ------------------------------------------------------------------------------------------------------------------ No results for input(s): TSH, T4TOTAL, T3FREE, THYROIDAB in the last 72 hours.  Invalid input(s): FREET3 ------------------------------------------------------------------------------------------------------------------ No results for input(s): VITAMINB12, FOLATE, FERRITIN, TIBC, IRON, RETICCTPCT in the last 72 hours.  Coagulation profile Recent Labs  Lab 08/18/20 0100 08/19/20 0309 08/20/20 0525 08/20/20 2327 08/22/20 0203  INR 1.8* 1.7* 2.0* 2.0* 2.3*   No results for input(s): DDIMER in the last 72 hours.  Cardiac Enzymes No results for input(s): CKTOTAL, CKMB, CKMBINDEX, TROPONINI in the last 168  hours.  ------------------------------------------------------------------------------------------------------------------    Component Value Date/Time   BNP 76.0 07/26/2020 0331     Antibiotics: Anti-infectives (From admission, onward)   Start     Dose/Rate Route Frequency Ordered Stop   08/20/20 2200  ceFEPIme (MAXIPIME) 1 g in sodium chloride 0.9 % 100 mL IVPB        1 g 200 mL/hr over 30 Minutes Intravenous Every 24 hours 08/20/20 1523     08/13/20 2200  ceFEPIme (MAXIPIME) 2 g in sodium chloride 0.9 % 100 mL IVPB  Status:  Discontinued        2 g 200 mL/hr over 30 Minutes Intravenous Every 24 hours 08/13/20 1154 08/20/20 1523   08/09/20 2200  ceFEPIme (MAXIPIME) 1 g in sodium chloride 0.9 % 100 mL IVPB  Status:  Discontinued        1 g 200 mL/hr over 30 Minutes Intravenous Every 24 hours 08/09/20 1238 08/13/20 1154   08/09/20 0000  ceFEPIme (MAXIPIME) 1 g in sodium chloride 0.9 % 100 mL IVPB  Status:  Discontinued        1 g 200 mL/hr over 30 Minutes Intravenous Every 24 hours 08/08/20 2305 08/09/20 1238       Radiology Reports  No results found.    DVT prophylaxis: Warfarin  Code Status: Full code  Family Communication: Discussed with patient's mother at bedside   Consultants:  Infectious disease  Procedures:      Objective    Physical Examination:  General-appears in no acute distress Heart-S1-S2, irregular, no murmur auscultated Lungs-clear to auscultation bilaterally, no wheezing or crackles auscultated Abdomen-soft, nontender, no organomegaly Extremities-no edema in the lower extremities Neuro-alert, oriented x3, no focal deficit noted  Status is: Inpatient  Dispo: The patient is from: Home              Anticipated d/c is to: Remain in hospital to complete 6 weeks of IV antibiotics  Anticipated d/c date is: To be decided              Patient currently not stable for discharge  Barrier to discharge-getting IV antibiotics in  the hospital  COVID-19 Labs  No results for input(s): DDIMER, FERRITIN, LDH, CRP in the last 72 hours.  Lab Results  Component Value Date   [place] NEGATIVE 08/08/2020   [place] NEGATIVE 07/18/2020    Microbiology  No results found for this or any previous visit (from the past 240 hour(s)).  Pressure Injury 07/18/20 Coccyx Posterior;Medial Unstageable - Full thickness tissue loss in which the base of the injury is covered by slough (yellow, tan, gray, green or brown) and/or eschar (tan, brown or black) in the wound bed. (Active)  07/18/20 2200  Location: Coccyx  Location Orientation: Posterior;Medial  Staging: Unstageable - Full thickness tissue loss in which the base of the injury is covered by slough (yellow, tan, gray, green or brown) and/or eschar (tan, brown or black) in the wound bed.  Wound Description (Comments):   Present on Admission: Yes     Pressure Injury 07/18/20 Buttocks Right Unstageable - Full thickness tissue loss in which the base of the injury is covered by slough (yellow, tan, gray, green or brown) and/or eschar (tan, brown or black) in the wound bed. (Active)  07/18/20   Location: Buttocks  Location Orientation: Right  Staging: Unstageable - Full thickness tissue loss in which the base of the injury is covered by slough (yellow, tan, gray, green or brown) and/or eschar (tan, brown or black) in the wound bed.  Wound Description (Comments):   Present on Admission: Yes          [place]   [facility] If 7PM-7AM, please contact night-coverage at [email or web address], Office  [phone]   08/22/2020, 12:05 PM  LOS: 14 days

## 2020-08-22 NOTE — Consult Note (Addendum)
Cardiology Consultation:   Patient ID: Alex [name] MRN: [id number]; DOB: Aug 10, 1980  Admit date: 08/08/2020 Date of Consult: 08/22/2020  PCP:  [place], [place] Prov[facility] Cardiologist:  Minus Breeding, MD   Patient Profile:   Alex [name] is a 40 y.o. male with a hx of HTN, polysubstance abuse (nicotine, heroin, methamphetamine), and Opioid use disorder on suboxone who is being seen 08/22/2020 for the evaluation of atrial flutter at the request of Dr. Darrick Meigs.  History of Present Illness:   Alex [name] is maintained on lisinopril-HCTZ for BP control. 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 MRI brain 07/22/20 with dozens of primary punctate infarcts throughout cerebellum and both cerebral hemispheres. 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 Hospital course complicated by cardiogenic shock, acute hypoxemic respiratory failure requiring intubation, AKI requiring HD, and anemia requiring transfusions. He was started on cefepime and coumadin. Pt left AMA on 08/07/20 and returned on 08/08/20 after being called by CT surgery. He had a fall after leaving AMA and now complains of back pain.   INR was supratherapeutic at 3.9. Coumadin was held.   5/15-5/26 INR was > 2.0 5/27 INR was 1.8 and started on heparin  5/29 to present: INR > 2.0  He reports standing up this morning to urinate and feeling heart palpitations that made him feel faint, dizzy, and shaky. He finished voiding and rested in bed. He also reported throat tightness. He states he felt throat tightness when he swallowed some ice chips. He had a similar sensation last evening at HD. He was aware of his rate and rhythm. Telemery reviewed with alternating atrial fibrillation and flutter with RVR. Cardizem gtt started and titrated to 15 mg/hr at 1025.  He  converted to sinus rhythm at noon today.  He is feeling much better, although mildly tachycardic. He denies pain, except back pain. No dyspnea. Legs wrapped bilaterally.    Past Medical History:  Diagnosis Date  . Ruptured lumbar disc     Past Surgical History:  Procedure Laterality Date  . AORTIC/RENAL BYPASS    . BENTALL PROCEDURE N/A 08/03/2020   Procedure: BENTALL PROCEDURE USING ON-X AORTIC VALVE SIZE 23 MM AND PERI-GUARD 4CM X 4CM PERICARDIUM;  Surgeon: Lajuana Matte, MD;  Location: [place];  Service: Open Heart Surgery;  Laterality: N/A;  . IR FLUORO GUIDE CV LINE RIGHT  08/01/2020  . IR US GUIDE VASC ACCESS RIGHT  08/01/2020  . TEE WITHOUT CARDIOVERSION N/A 08/03/2020   Procedure: TRANSESOPHAGEAL ECHOCARDIOGRAM (TEE);  Surgeon: Lajuana Matte, MD;  Location: [facility];  Service: Open Heart Surgery;  Laterality: N/A;     Home Medications:  Prior to Admission medications   Medication Sig Start Date End Date Taking? Authorizing Provider  buprenorphine-naloxone (SUBOXONE) 8-2 mg SUBL SL tablet Place 1 tablet under the tongue See admin instructions. Place one tablet under the tongue twice daily. May place additional one-half tablet under tongue as needed for symptoms of withdrawal.   Yes [provider]  lisinopril-hydrochlorothiazide (ZESTORETIC) 10-12.5 MG tablet Take 1 tablet by mouth daily. 11/12/19  Yes [provider]    Inpatient Medications: Scheduled Meds: . (feeding supplement) PROSource Plus  30 mL Oral BID BM  . buprenorphine-naloxone  1 tablet Sublingual BID  . Chlorhexidine Gluconate Cloth  6 each Topical Daily  . Chlorhexidine Gluconate Cloth  6 each Topical  [id number]  . Chlorhexidine Gluconate Cloth  6 each Topical Q0600  . Chlorhexidine Gluconate Cloth  6 each Topical Q0600  . collagenase   Topical Daily  . darbepoetin (ARANESP) injection - NON-DIALYSIS  150 mcg Subcutaneous Q Sat-1800  . iron polysaccharides  150 mg Oral Daily  . lidocaine  1  patch Transdermal Q24H  . melatonin  3 mg Oral QHS  . methocarbamol  500 mg Oral TID  . nicotine  14 mg Transdermal Daily  . polyethylene glycol  17 g Oral Daily  . senna-docusate  1 tablet Oral BID  . warfarin  7.5 mg Oral ONCE-1600  . Warfarin - Pharmacist Dosing Inpatient   Does not apply q1600   Continuous Infusions: . ceFEPime (MAXIPIME) IV 1 g (08/22/20 0151)  . diltiazem (CARDIZEM) infusion 15 mg/hr (08/22/20 1025)  . heparin 3,150 Units/hr (08/22/20 1335)   PRN Meds: acetaminophen **OR** acetaminophen, bisacodyl, clonazePAM, lactulose, ondansetron **OR** ondansetron (ZOFRAN) IV  Allergies:   No Known Allergies  Social History:   Social History   Socioeconomic History  . Marital status: Single    Spouse name: Not on file  . Number of children: Not on file  . Years of education: Not on file  . Highest education level: Not on file  Occupational History  . Not on file  Tobacco Use  . Smoking status: Current Every Day Smoker    Packs/day: 1.00    Types: Cigarettes  . Smokeless tobacco: Never Used  Vaping Use  . Vaping Use: Never used  Substance and Sexual Activity  . Alcohol use: No  . Drug use: Yes    Types: Marijuana    Comment: denies use 08/24/17  . Sexual activity: Not on file  Other Topics Concern  . Not on file  Social History Narrative  . Not on file   Social Determinants of Health   Financial Resource Strain: Not on file  Food Insecurity: Not on file  Transportation Needs: Not on file  Physical Activity: Not on file  Stress: Not on file  Social Connections: Not on file  Intimate Partner Violence: Not on file    Family History:   History reviewed. No pertinent family history.   ROS:  Please see the history of present illness.   All other ROS reviewed and negative.     Physical Exam/Data:   Vitals:   08/22/20 0855 08/22/20 0937 08/22/20 1028 08/22/20 1144  BP: (!) 139/102 (!) 138/95 135/90 (!) 144/99  Pulse:  (!) 142 (!) 138 (!) 134   Resp: '20 16 20 18  '$ Temp:  98.7 F (37.1 C)  98.2 F (36.8 C)  TempSrc:  Oral  Oral  SpO2:  98% 100% 98%  Weight:      Height:        Intake/Output Summary (Last 24 hours) at 08/22/2020 1428 Last data filed at 08/22/2020 0900 Gross per 24 hour  Intake 956.86 ml  Output 3425 ml  Net -2468.14 ml   Last 3 Weights 08/22/2020 08/21/2020 08/20/2020  Weight (lbs) 298 lb 4.5 oz 293 lb 3.4 oz 293 lb 12.8 oz  Weight (kg) 135.3 kg 133 kg 133.267 kg     Body mass index is 36.31 kg/m.  General:  Obese male in NAD HEENT: normal Neck: no JVD Vascular: No carotid bruits Cardiac:  normal S1, S2; RRR; valve click Lungs:  clear to auscultation bilaterally, no wheezing, rhonchi or rales  Abd: soft, nontender, no hepatomegaly  Ext: legs wrapped bilaterally Musculoskeletal:  No deformities, BUE and BLE strength normal and equal Skin: warm and dry  Neuro:  CNs 2-12 intact, no focal abnormalities noted Psych:  Normal affect   EKG:  The EKG was personally reviewed and demonstrates:  Atrial flutter with ventricular rate 136 Telemetry:  Telemetry was personally reviewed and demonstrates:  Was not on telemetry prior to Afib/flutter --> Afib and atrial flutter with rates 130-180s --> sinus rhythm - sinus tachycardia ~noon 08/22/20  Relevant CV Studies:  TEE 07/26/20: 1. 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  2. 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 . The aortic valve is abnormal. Aortic valve  regurgitation is severe.  3. Left ventricular ejection fraction, by estimation, is 60 to 65%. The  left ventricle has normal function. The left ventricle has no regional  wall motion abnormalities.  4. Right ventricular systolic function is normal. The right ventricular  size is normal.  5. No  left atrial/left atrial appendage thrombus was detected.  6. The mitral valve is normal in structure. No evidence of mitral valve  regurgitation.   Laboratory Data:  High Sensitivity Troponin:  No results for input(s): TROPONINIHS in the last 720 hours.   Chemistry Recent Labs  Lab 08/20/20 0525 08/20/20 2327 08/22/20 1028  NA 135 137 136  K 3.5 3.6 3.4*  CL 106 107 103  CO2 '22 25 27  '$ GLUCOSE 107* 100* 116*  BUN 29* 33* 16  CREATININE 3.95* 4.44* 2.81*  CALCIUM 7.9* 8.0* 8.5*  GFRNONAA 19* 17* 29*  ANIONGAP '7 5 6    '$ Recent Labs  Lab 08/19/20 0309 08/20/20 0525 08/20/20 2327  PROT 6.7 6.0* 5.9*  ALBUMIN 2.2* 2.0* 2.0*  AST '17 16 16  '$ ALT '8 7 9  '$ ALKPHOS 62 50 53  BILITOT 0.8 0.7 0.5   Hematology Recent Labs  Lab 08/20/20 0525 08/20/20 2327 08/22/20 0203  WBC 6.8 7.7 7.8  RBC 2.65* 2.72* 2.96*  HGB 7.3* 7.4* 8.0*  HCT 24.2* 24.7* 26.5*  MCV 91.3 90.8 89.5  MCH 27.5 27.2 27.0  MCHC 30.2 30.0 30.2  RDW 15.3 15.2 14.8  PLT 226 226 212   BNPNo results for input(s): BNP, PROBNP in the last 168 hours.  DDimer No results for input(s): DDIMER in the last 168 hours.   Radiology/Studies:  No results found.   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 Hx of stroke from septic emboli S/P mechanica aortic valve - coumadin with INR goal 2.5-3.5 x 3 months, then 1.5 thereafter - 6 weeks of cefepime starting 08/04/20   AKI - suspect related to ATN from  infection and surgery - still on HD - sCr 2.81 today after HD yesterday, making urine - per nephrology   Throat tightness - unclear if this is an  anginal equivalent - reported after HD yesterday and during rapid heart rates this morning - did not have CT coronary or heart cath prior to valve replacement, but low suspicion for ACS - continue to follow   Hypertension - likely will not need ACEI-HCTZ at discharge - PO cardizem as above    Risk Assessment/Risk Scores:     CHA2DS2-VASc Score = 3 This indicates a 3.2% annual risk of stroke. The patient's score is based upon: CHF History: No HTN History: Yes Diabetes History: No Stroke History: Yes Vascular Disease History: No Age Score: 0 Gender Score: 0    For questions or updates, please contact [place] Please consult [email or web address] for contact info under    Signed, Ledora Bottcher, PA  08/22/2020 2:28 PM   History and all data above reviewed.  Patient examined.  I agree with the findings as above.   The patient reports that he was standing in his room when he felt light headed.  He has been having some throat pain and thought he was getting a cold.  When he got light headed his throat was more uncomfortable.  He felt light headed.  He sat on the bed and the nurse noted his pulse to be elevated.  Review of an EKG done at that time demonstrated that he was in narrow complex tachycardia with with what appeared to be typical flutter with 2:1 conduction.   He was placed on IV Cardizem.  He did go into NSR and felt this as well.  He is now in sinus tach.  He feels fine now.  He has never had this sensation before.  He was in an active job and never had any cardiac issues before his admission with endocarditis earlier this year. He had not had chest pain, SOB or palpitations prior to this.   The patient exam reveals [id number] S1, regular rate and rhythm  ,  Lungs: Clear  ,  Abd: Bowel, Ext No Edema  .  All available labs,  radiology testing, previous records reviewed. Agree with documented assessment and plan.  Atrial flutter:  Now in NSR on IV Cardizem.  Plan to continue tonight and then switch to PO Cardizem.  If he has recurrent flutter we could consider ablation.  Continue current plans for anticoagulation.    Alex [name]  3:30 PM  08/22/2020

## 2020-08-23 DIAGNOSIS — I483 Typical atrial flutter: Secondary | ICD-10-CM | POA: Diagnosis not present

## 2020-08-23 DIAGNOSIS — I4891 Unspecified atrial fibrillation: Secondary | ICD-10-CM | POA: Diagnosis not present

## 2020-08-23 DIAGNOSIS — I330 Acute and subacute infective endocarditis: Secondary | ICD-10-CM | POA: Diagnosis not present

## 2020-08-23 DIAGNOSIS — N170 Acute kidney failure with tubular necrosis: Secondary | ICD-10-CM | POA: Diagnosis not present

## 2020-08-23 LAB — RENAL FUNCTION PANEL
Albumin: 1.9 g/dL — ABNORMAL LOW (ref 3.5–5.0)
Anion gap: 10 (ref 5–15)
BUN: 21 mg/dL — ABNORMAL HIGH (ref 6–20)
CO2: 24 mmol/L (ref 22–32)
Calcium: 8.2 mg/dL — ABNORMAL LOW (ref 8.9–10.3)
Chloride: 100 mmol/L (ref 98–111)
Creatinine, Ser: 3.25 mg/dL — ABNORMAL HIGH (ref 0.61–1.24)
GFR, Estimated: 24 mL/min — ABNORMAL LOW (ref >60)
Glucose, Bld: 96 mg/dL (ref 70–99)
Phosphorus: 3.1 mg/dL (ref 2.5–4.6)
Potassium: 3.9 mmol/L (ref 3.5–5.1)
Sodium: 134 mmol/L — ABNORMAL LOW (ref 135–145)

## 2020-08-23 LAB — CBC
HCT: 23.4 % — ABNORMAL LOW (ref 39.0–52.0)
Hemoglobin: 7.1 g/dL — ABNORMAL LOW (ref 13.0–17.0)
MCH: 27 pg (ref 26.0–34.0)
MCHC: 30.3 g/dL (ref 30.0–36.0)
MCV: 89 fL (ref 80.0–100.0)
Platelets: 197 10*3/uL (ref 150–400)
RBC: 2.63 MIL/uL — ABNORMAL LOW (ref 4.22–5.81)
RDW: 15.2 % (ref 11.5–15.5)
WBC: 9.5 10*3/uL (ref 4.0–10.5)
nRBC: 0 % (ref 0.0–0.2)

## 2020-08-23 LAB — PROTIME-INR
INR: 3.1 — ABNORMAL HIGH (ref 0.8–1.2)
Prothrombin Time: 31.6 seconds — ABNORMAL HIGH (ref 11.4–15.2)

## 2020-08-23 LAB — TSH: TSH: 2.14 u[IU]/mL (ref 0.350–4.500)

## 2020-08-23 LAB — HEPARIN LEVEL (UNFRACTIONATED): Heparin Unfractionated: 0.14 IU/mL — ABNORMAL LOW (ref 0.30–0.70)

## 2020-08-23 LAB — MAGNESIUM: Magnesium: 1.8 mg/dL (ref 1.7–2.4)

## 2020-08-23 MED ORDER — DILTIAZEM HCL ER COATED BEADS 180 MG PO CP24
180.0000 mg | ORAL_CAPSULE | Freq: Every day | ORAL | Status: DC
  Administered 2020-08-23 – 2020-09-15 (×24): 180 mg via ORAL
  Filled 2020-08-23 (×24): qty 1

## 2020-08-23 MED ORDER — WARFARIN SODIUM 5 MG PO TABS
5.0000 mg | ORAL_TABLET | Freq: Once | ORAL | Status: AC
  Administered 2020-08-23: 5 mg via ORAL
  Filled 2020-08-23: qty 1

## 2020-08-23 NOTE — TOC Progression Note (Signed)
Transition of Care [facility]) - Progression Note    Patient Details  Name: BURNARD BRUNKHORST MRN: [id number] Date of Birth: 1981-03-13  Transition of Care [facility]) CM/SW [place], RN Phone Number: 08/23/2020, 1:21 PM  Clinical Narrative:    CM spoke with Raquel Sarna, RNCM with [facility] and she is planning on visiting with the patient this afternoon for assessment and possible admission to the facility if accepted.  CM and MSW will continue to follow for possible LTAC admission.   Expected Discharge Plan: Long Term Acute Care (LTAC) Barriers to Discharge: Continued Medical Work up  Expected Discharge Plan and Services Expected Discharge Plan: Long Term Acute Care (LTAC)   Discharge Planning Services: CM Consult Post Acute Care Choice: Long Term Acute Care (LTAC) Living arrangements for the past 2 months: Single Family Home                                       Social Determinants of Health (SDOH) Interventions    Readmission Risk Interventions No flowsheet data found.

## 2020-08-23 NOTE — Progress Notes (Signed)
Patient ambulated in hallway independently. Will monitor. Zoella Roberti, Bettina Gavia RN

## 2020-08-23 NOTE — Progress Notes (Addendum)
Patient with left buttock area wound all dressing changed as ordered. Measured 1.5cm x1.3cm Upper center  buttock area 1cm x1cm Medial buttock area 4cmx .4 cm  right buttock area healing slightly pink in appearance.  Will monitor area. Alex [name], Bettina Gavia RN

## 2020-08-23 NOTE — Progress Notes (Signed)
Patient ID: Alex [name], male   DOB: 1980/10/13, 40 y.o.   MRN: [id number] Alex [name] Progress Note   Assessment/ Plan:   1. Acute kidney Injury: With dense ATN related to SBE/postoperative course.  Earlier on CRRT and transitioned to intermittent hemodialysis starting 5/16.  His last hemodialysis was on Monday 5/30 and he continues to maintain decent urine output (2 L overnight) without any acute indications for dialysis at this time.  Based on my current assessment, he would be suitable for transfer/admission to an LTAC where additional determinations can be made based on his daily labs/clinical assessment regarding requirements for dialysis or removal of TDC. 2.  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 3.  Anemia: Likely secondary to acute/critical illness and perioperative losses.  On Aranesp/iron supplementation with plans for PRBC transfusion today. 4.  Hypertension: Blood pressure under decent control, continue to monitor with UF/HD. 5.  Atrial flutter with RVR: Seen by cardiology and has been transitioned from intravenous diltiazem to oral.  Subjective:   Reports to be feeling-denies any chest pain, shortness of breath or additional dizziness overnight.   Objective:   BP 129/81 (BP Location: Left Arm)   Pulse 87   Temp 98.1 F (36.7 C) (Oral)   Resp 16   Ht '6\' 4"'$  (1.93 m)   Wt 134.8 kg   SpO2 93%   BMI 36.17 kg/m   Intake/Output Summary (Last 24 hours) at 08/23/2020 0921 Last data filed at 08/23/2020 [id number] Gross per 24 hour  Intake 1543.98 ml  Output 1330 ml  Net 213.98 ml   Weight change: -0.5 kg  Physical Exam: Gen: Appears comfortable resting flat in bed CVS: Pulse irregular tachycardia, mechanical click audible over the precordium. Resp: Clear to auscultation bilaterally, no rales/rhonchi.  Right IJ TDC in place Abd: Soft, obese, nontender, bowel  sounds normal Ext: Trace to 1+ edema right leg with trace left ankle edema.  Imaging: No results found.  Labs: BMET Recent Labs  Lab 08/17/20 0234 08/18/20 0100 08/19/20 0309 08/20/20 0525 08/20/20 2327 08/22/20 0203 08/22/20 1028 08/23/20 0155  NA 132* 136 137 135 137  --  136 134*  K 3.8 3.8 3.9 3.5 3.6  --  3.4* 3.9  CL 99 102 101 106 107  --  103 100  CO2 '24 24 29 22 25  '$ --  27 24  GLUCOSE 92 87 81 107* 100*  --  116* 96  BUN 40* 47* 17 29* 33*  --  16 21*  CREATININE 4.96* 5.43* 2.90* 3.95* 4.44*  --  2.81* 3.25*  CALCIUM 7.9* 8.0* 8.4* 7.9* 8.0*  --  8.5* 8.2*  PHOS 6.4* 6.4* 2.9 4.2 4.3 2.4*  --  3.1   CBC Recent Labs  Lab 08/16/20 0941 08/17/20 0234 08/20/20 0525 08/20/20 2327 08/22/20 0203 08/23/20 0155  WBC 8.3   < > 6.8 7.7 7.8 9.5  NEUTROABS 5.9  --   --   --   --   --   HGB 8.1*   < > 7.3* 7.4* 8.0* 7.1*  HCT 26.1*   < > 24.2* 24.7* 26.5* 23.4*  MCV 88.8   < > 91.3 90.8 89.5 89.0  PLT 324   < > 226 226 212 197   < > = values in this interval not displayed.    Medications:    . (feeding supplement) PROSource Plus  30 mL Oral BID BM  .  buprenorphine-naloxone  1 tablet Sublingual BID  . Chlorhexidine Gluconate Cloth  6 each Topical Daily  . collagenase   Topical Daily  . darbepoetin (ARANESP) injection - NON-DIALYSIS  150 mcg Subcutaneous Q Sat-1800  . iron polysaccharides  150 mg Oral Daily  . lidocaine  1 patch Transdermal Q24H  . melatonin  3 mg Oral QHS  . methocarbamol  500 mg Oral TID  . nicotine  14 mg Transdermal Daily  . polyethylene glycol  17 g Oral Daily  . senna-docusate  1 tablet Oral BID  . warfarin  5 mg Oral ONCE-1600  . Warfarin - Pharmacist Dosing Inpatient   Does not apply q1600   Elmarie Shiley, MD 08/23/2020, 9:21 AM

## 2020-08-23 NOTE — Progress Notes (Signed)
Physical Therapy Treatment Patient Details Name: Alex [name] MRN: [id number] DOB: 1981/02/17 Today's Date: 08/23/2020    History of Present Illness 40 yo male smoker brought [facility] ER 07/18/20 with altered mental status. Found to have hypotension, AKI, thrombocytopenia, splenomegaly, lactic acidosis, elevated bilirubin, hypoglycemia, polysubstance abuse. Treated for sepsis Intubated 4/28-5/5. 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 5/10 to IR for tunneled HD cath.  5/12 S/P aortic valve repair.  Extubated on CRRT 5/13. Pt to remain in hospital for remaining IV abx for a total of 6 weeks. PMH:  HTN, polysubstance abuse (nicotine, heroin, methamphetamine), and Opioid use disorder on suboxone.    PT Comments    Pt received in supine, agreeable to therapy session and with good participation and tolerance for gait progression and stair training. Pt limited due to mild R thigh pain and decreased activity tolerance, reporting 5/10 modified RPE (fatigue) after community distance ambulation task and HR elevated with exertion to 140's bpm, RN notified. Pt scored 21/24 on DGI indicating decreased fall risk compared with previous sessions but he did have minor episode of buckling with stair ascent so will continue to follow 1x/week to ensure progress toward goals. Pt continues to benefit from PT services to progress toward functional mobility goals, at least 1-2 more sessions.   Follow Up Recommendations  No PT follow up     Equipment Recommendations  None recommended by PT    Recommendations for Other Services       Precautions / Restrictions Precautions Precautions: Fall Restrictions Weight Bearing Restrictions: No    Mobility  Bed Mobility Overal bed  mobility: Modified Independent                  Transfers Overall transfer level: Modified independent                  Ambulation/Gait Ambulation/Gait assistance: Modified independent (Device/Increase time);Supervision Gait Distance (Feet): 500 Feet Assistive device: None Gait Pattern/deviations: Step-through pattern     General Gait Details: stable gait with ability to attain age appropriate gait speed for a short period, limited by dyspnea/fatigue.  Pt able to maintain balance without overt deviation to moderate challenge including abrupt directional and speed changes, scanning, stepping over obstacles and negotiation of stairs.   Stairs   Stairs assistance: Min guard Stair Management: Step to pattern;Forwards;One rail Left Number of Stairs: 3 General stair comments: Noticeable weakness ascending stairs, R LE buckling and needed min guard and L rail to correct, cues for step-to sequencing and activity pacing pt did better with cues for each step.   Wheelchair Mobility    Modified Rankin (Stroke Patients Only)       Balance Overall balance assessment: Needs assistance Sitting-balance support: Feet supported Sitting balance-Leahy Scale: Good Sitting balance - Comments: donning socks, moving outside his BOS   Standing balance support: No upper extremity supported Standing balance-Leahy Scale: Fair Standing balance comment: feet together >60 seconds no LOB, pt performed semi-tandem and had LOB after ~30 seconds then full tandem and LOB after ~8 seconds, eyes open for all tasks                 Standardized Balance Assessment Standardized Balance Assessment : Dynamic Gait Index   Dynamic Gait Index Level Surface: Normal Change in Gait Speed: Normal Gait with Horizontal Head Turns:  Mild Impairment Gait with Vertical Head Turns: Normal Gait and Pivot Turn: Normal Step Over Obstacle: Normal Step Around Obstacles: Normal Steps: Moderate  Impairment Total Score: 21      Cognition Arousal/Alertness: Awake/alert Behavior During Therapy: WFL for tasks assessed/performed Overall Cognitive Status: Within Functional Limits for tasks assessed                         Following Commands: Follows one step commands consistently;Follows multi-step commands consistently       General Comments: vcs for stair sequencing due to poor recall from previous session ~6 days prior, reinforced sequencing with him/mother; otherwise cooperative and fair safety awareness      Exercises      General Comments General comments (skin integrity, edema, etc.): HR max 141 bpm per pulse ox, RN notified; SpO2 WNL and no c/o dizziness, pallor good. Fair safety awareness.      Pertinent Vitals/Pain Pain Assessment: Faces Faces Pain Scale: Hurts little more Pain Location: R thigh, increases with R hip flexion while seated and weight bearing Pain Descriptors / Indicators: Aching;Burning;Discomfort Pain Intervention(s): Limited activity within patient's tolerance;Monitored during session;Repositioned           PT Goals (current goals can now be found in the care plan section) Acute Rehab PT Goals Patient Stated Goal: To get stronger. PT Goal Formulation: With patient Time For Goal Achievement: 08/30/20 Potential to Achieve Goals: Good Progress towards PT goals: Progressing toward goals    Frequency    Min 1X/week      PT Plan Current plan remains appropriate       AM-PAC PT "6 Clicks" Mobility   Outcome Measure  Help needed turning from your back to your side while in a flat bed without using bedrails?: None Help needed moving from lying on your back to sitting on the side of a flat bed without using bedrails?: None Help needed moving to and from a bed to a chair (including a wheelchair)?: None Help needed standing up from a chair using your arms (e.g., wheelchair or bedside chair)?: None Help needed to walk in hospital  room?: None Help needed climbing 3-5 steps with a railing? : A Little 6 Click Score: 23    End of Session Equipment Utilized During Treatment: Gait belt Activity Tolerance: Patient tolerated treatment well Patient left: in bed;with call bell/phone within reach;with family/visitor present Nurse Communication: Mobility status;Other (comment) (tachy with exertion, R thigh pain) PT Visit Diagnosis: Other abnormalities of gait and mobility (R26.89)     Time: [id number] PT Time Calculation (min) (ACUTE ONLY): 14 min  Charges:  $Gait Training: 8-22 mins                     Megyn Leng P., PTA Acute Rehabilitation Services Pager: [phone] Office: [place] 08/23/2020, 6:01 PM

## 2020-08-23 NOTE — Progress Notes (Signed)
ANTICOAGULATION CONSULT NOTE - Follow Up Consult  Pharmacy Consult for Warfarin / Heparin Indication: mechanical aortic valve  Labs: Recent Labs    08/20/20 2327 08/21/20 0704 08/22/20 0203 08/22/20 1028 08/22/20 2110 08/23/20 0155  HGB 7.4*  --  8.0*  --   --  7.1*  HCT 24.7*  --  26.5*  --   --  23.4*  PLT 226  --  212  --   --  197  LABPROT 22.8*  --  24.9*  --   --  31.6*  INR 2.0*  --  2.3*  --   --  3.1*  HEPARINUNFRC 0.21*   < >  --  0.19* 0.24* 0.14*  CREATININE 4.44*  --   --  2.81*  --  3.25*   < > = values in this interval not displayed.   Assessment: 39yom with recent endocarditis> AV root repair and Mechanical AVR on 08/03/20. Warfarin started 5/13. Left AMA 5/16 pm then returned 5/17 pm. INR 2.6 on 5/18 and Warfarin resumed. Pharmacy was consulted to bridge warfarin with heparin until INR >2.5 again.  -INR= 3.1 hg= 7.1 (has been low) -heparin level= 0.14    Goal of Therapy:  Heparin level 0.3-0.7 INR 2.5-3.5 per Dr. Kipp Brood Monitor platelets by anticoagulation protocol: Yes   Plan:  Anticipate d/c heparin today with therapeutic INR Warfarin '5mg'$  x1  Daily CBC and INR  Hildred Laser, PharmD Clinical Pharmacist **Pharmacist phone directory can now be found on [email or web address] ([email or web address]).  Listed under [place].

## 2020-08-23 NOTE — TOC Initial Note (Signed)
Transition of Care [facility]) - Initial/Assessment Note    Patient Details  Name: Alex [name] MRN: [id number] Date of Birth: [birth year]  [facility] CM/SW Contact:    Carles Collet, RN Phone Number: 08/23/2020, 12:04 PM  Clinical Narrative:      Notified by Raquel Sarna with [facility] that they have been consulted by MD and following since ICU stay. Per Dr Posey Pronto notes "Based on my current assessment, he would be suitable for transfer/admission to an LTAC where additional determinations can be made based on his daily labs/clinical assessment regarding requirements for dialysis or removal of [facility]." patient has also been transitioned to oral cardizem.  Spoke w patient at bedside with his mother, they are agreeable to LTAC. Requested Raquel Sarna from [facility] to meet with them at bedside. To answer questions.            Expected Discharge Plan: Long Term Acute Care (LTAC) Barriers to Discharge: Continued Medical Work up   Patient Goals and CMS Choice     Choice offered to / list presented to : [place]  Expected Discharge Plan and Services Expected Discharge Plan: Long Term Acute Care (LTAC)     Post Acute Care Choice: Long Term Acute Care (LTAC)                                        Prior Living Arrangements/Services                       Activities of Daily Living Home Assistive Devices/Equipment: None ADL Screening (condition at time of admission) Patient's cognitive ability adequate to safely complete daily activities?: Yes Is the patient deaf or have difficulty hearing?: No Does the patient have difficulty seeing, even when wearing glasses/contacts?: No Does the patient have difficulty concentrating, remembering, or making decisions?: Yes Patient able to express need for assistance with ADLs?: Yes Does the patient have difficulty dressing or bathing?: Yes Independently performs ADLs?: Yes (appropriate for developmental age) Does the patient  have difficulty walking or climbing stairs?: No Weakness of Legs: None Weakness of Arms/Hands: None  Permission Sought/Granted                  Emotional Assessment              Admission diagnosis:  Endocarditis [I38] Endocarditis, unspecified chronicity, unspecified endocarditis type [I38] Patient Active Problem List   Diagnosis Date Noted  . Acute kidney injury (AKI) with acute tubular necrosis (ATN) (HCC)   . Endocarditis 08/08/2020  . Anemia 08/08/2020  . Cerebral embolism with cerebral infarction 07/24/2020  . Multi-organ failure with heart failure ([place])   . Aortic valve endocarditis   . Pressure injury of skin 07/20/2020  . Elevated LFTs   . Sepsis ([place]) 07/18/2020  . Septic shock ([place]) 07/18/2020  . Altered mental status 07/18/2020  . Acute renal failure with oliguria ([place])   . Hypotension   . Elevated bilirubin   . Thrombocytopenia ([place])   . Plantar fasciitis, left 07/12/2020  . Essential hypertension 01/14/2020  . 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 Nicotine dependence, cigarettes, uncomplicated [id number]  . Opioid dependence on agonist therapy ([place]) 09/24/2017  . Opioid use disorder 09/24/2017   PCP:  [place], [place]:   [facility] [address], [place] - [place] [address] [place] [address]  Phone: [phone] Fax: [place], [place] [address] Phone: [phone] Fax: [phone]     Social Determinants of Health (SDOH) Interventions    Readmission Risk Interventions No flowsheet data found.

## 2020-08-23 NOTE — Progress Notes (Addendum)
[facility] PROGRESS NOTE  Alex [name] [id number] DOB: November 02, 1980 DOA: 08/08/2020 PCP: [facility]  Status: Remains inpatient appropriate because:Unsafe d/c plan, IV treatments appropriate due to intensity of illness or inability to take PO and Inpatient level of care appropriate due to severity of illness   Dispo: The patient is from: Home              Anticipated d/c is to: Home after LTAC?              Patient currently is not medically stable to d/c.   Difficult to place patient Yes   Level of care: Med-Surg  Code Status: Full Family Communication:  DVT prophylaxis: Heparin infusion COVID vaccination status: Received first dose of Pfizer on 08/16/2020  HPI: 40 year old male with history of polysubstance abuse, aortic valve endocarditis, Serratia bacteremia, septic emboli causing stroke and acute kidney injury requiring hemodialysis.  Patient left hospital AMA on 08/08/2020.  He came back same night after discussion with CT surgery.  Significant events:  4/26 transfer from [facility] to [facility]  CT head 4/26 >> no acute findings  CT abd/pelvis 4/26 >> cholelithiasis w/o inflammatory chagnes, mild hepatomegaly (25 cm), moderate splenomegaly (16.7 cm), 2 mm non obstructing kidney stone on Lt  4/27 CVL placed. Pressors escalating.   4/28 intubated for TEE, CVC, HD, and A-line placed. CRRT started   Remains on CRRT  MRI on 4/30-multiple infarcts  5/4: repeat TEE shows an aortic valve abscess  5/5: extubated , self-removed HD cath so CRRT holiday> developed severe hyperkalemia overnight.  5/6: new temp HD cath placed   5/8: new temp HD cath placed after prior one self-removed by patient  5/9: Orthopantogram neg for abscess  5/10: HD cath replaced by IR [facility]  5/12 to OR for AVR, aortic root reconstruction, closure of aortic sinus track. To 2H ICU on vent post op. 4 units PRBC given intraop.   5/13 extubated, on CRRT, remains  on pressors  5/18, left AMA.  5/30 continues on intermittent hemodialysis as directed by nephrology team.  Continues with excellent urinary output.  Creatinine between 2.9 and 4.5  Subjective: Sitting on side of bed.  No further dizziness or awareness of palpitations.  States was last dialyzed this past Monday and nephro has no further plans to dialyze for now and are in a waiting and watch mode.  Objective: Vitals:   08/23/20 0347 08/23/20 0738  BP: 120/80 129/81  Pulse: 86 87  Resp: 18 16  Temp: 99.5 F (37.5 C) 98.1 F (36.7 C)  SpO2: 97% 93%    Intake/Output Summary (Last 24 hours) at 08/23/2020 0831 Last data filed at 08/23/2020 [id number] Gross per 24 hour  Intake 1543.98 ml  Output 2005 ml  Net -461.02 ml   Filed Weights   08/21/20 0410 08/22/20 0639 08/23/20 0347  Weight: 133 kg 135.3 kg 134.8 kg    Exam:  Constitutional: Alert, calm, no acute distress, appears comfortable Respiratory: Posterior lung sounds are clear, room air, normal respiratory effort Cardiovascular: Continues to have some edema right greater than left but overall significantly in proved.  Ace wraps remain in place.  Normal heart sounds. Abdomen:  LBM 5/30, soft nontender nondistended with normoactive bowel sounds Genitourinary: Voids easily Neurologic: CN 2-12 grossly intact. Sensation intact, DTR normal. Strength 5/5 x all 4 extremities.  Psychiatric: Alert and oriented x3.  Appropriate and pleasant affect   Assessment/Plan: Acute problems: Aortic valve endocarditis with perivalvular abscess-Serratia bacteremia,  septic emboli causing strokes.   -Presented with abscess physiology on 4/26 eventually was transferred to ICU, was intubated.   -MRI brain on 07/22/2020 revealed multiple acute infarcts.   -Blood cultures positive for Serratia and Echocardiogram showed severe AI and AR with AV vegetation confirmed by TEE.   -Now s/p mechanical aortic valve replacement, aortic root construction and closure of  aortic sinus tract.   -Needs 6 weeks of cefepime per ID with start date from 08/04/2020. -Continue Coumadin per pharmacy.    -Patient left AMA on 08/07/2020 however came back to hospital after speaking with CT surgery on 08/08/2020.  Atrial fibrillation/flutter with RVR -Was symptomatic with onset with dizziness and weakness-resolved -Cardiology assistance -Currently rate controlled on Cardizem and metoprolol; has converted back to sinus paced rhythm -Continue warfarin and telemetry  Acute kidney injury -likely from ATN from infection -Nephrology following  -Initially required CRRT and currently is on intermittent hemodialysis -last dialyzed 5/30 with plans to monitor off dialysis for 1 week -UOP averaging about 2 L per 24-hour  Anemia/thrombocytopenia -patient is s/p 6 units PRBC during last hospital stay.   -Transfuse PRBC as needed for hemoglobin less than 7. -Hemoglobin is greater than 7.0  Polysubstance abuse -patient has amphetamine abuse, nicotine dependence, opiate dependence on agonist therapy.  He is on chronic Suboxone.  Bilateral lower extremity edema -Markedly improved  -Continue on [place] boots.  Obesity BMI-34.14kg/m    Data Reviewed: Basic Metabolic Panel: Recent Labs  Lab 08/19/20 0309 08/20/20 0525 08/20/20 2327 08/22/20 0203 08/22/20 1028 08/23/20 0155  NA 137 135 137  --  136 134*  K 3.9 3.5 3.6  --  3.4* 3.9  CL 101 106 107  --  103 100  CO2 '29 22 25  '$ --  27 24  GLUCOSE 81 107* 100*  --  116* 96  BUN 17 29* 33*  --  16 21*  CREATININE 2.90* 3.95* 4.44*  --  2.81* 3.25*  CALCIUM 8.4* 7.9* 8.0*  --  8.5* 8.2*  MG 2.1 1.9 1.9 1.8  --  1.8  PHOS 2.9 4.2 4.3 2.4*  --  3.1   Liver Function Tests: Recent Labs  Lab 08/17/20 0234 08/18/20 0100 08/19/20 0309 08/20/20 0525 08/20/20 2327 08/23/20 0155  AST 13* 14* '17 16 16  '$ --   ALT '7 6 8 7 9  '$ --   ALKPHOS 52 51 62 50 53  --   BILITOT 1.0 0.9 0.8 0.7 0.5  --   PROT 5.8* 6.0* 6.7 6.0* 5.9*  --    ALBUMIN 1.9* 1.9* 2.2* 2.0* 2.0* 1.9*     CBC: Recent Labs  Lab 08/16/20 0941 08/17/20 0234 08/19/20 0309 08/20/20 0525 08/20/20 2327 08/22/20 0203 08/23/20 0155  WBC 8.3   < > 8.0 6.8 7.7 7.8 9.5  NEUTROABS 5.9  --   --   --   --   --   --   HGB 8.1*   < > 8.6* 7.3* 7.4* 8.0* 7.1*  HCT 26.1*   < > 28.2* 24.2* 24.7* 26.5* 23.4*  MCV 88.8   < > 89.5 91.3 90.8 89.5 89.0  PLT 324   < > 298 226 226 212 197   < > = values in this interval not displayed.    BNP (last 3 results) Recent Labs    07/25/20 0334 07/26/20 0331  BNP 153.5* 76.0     Continuous Infusions: . ceFEPime (MAXIPIME) IV Stopped (08/22/20 2246)  . diltiazem (CARDIZEM) infusion 15 mg/hr (  08/23/20 [id number])  . heparin 3,250 Units/hr (08/23/20 [id number])    Principal Problem:   Endocarditis Active Problems:   Opioid use disorder   Acute renal failure with oliguria (HCC)   Cerebral embolism with cerebral infarction   Anemia   Acute kidney injury (AKI) with acute tubular necrosis (ATN) (HCC)   Consultants:  Infectious disease  PCCM  Neurology  CT surgery  Procedures:  Echocardiogram  TEE  CRRT  Cortrack  Antibiotics: Anti-infectives (From admission, onward)   Start     Dose/Rate Route Frequency Ordered Stop   08/20/20 2200  ceFEPIme (MAXIPIME) 1 g in sodium chloride 0.9 % 100 mL IVPB        1 g 200 mL/hr over 30 Minutes Intravenous Every 24 hours 08/20/20 1523     08/13/20 2200  ceFEPIme (MAXIPIME) 2 g in sodium chloride 0.9 % 100 mL IVPB  Status:  Discontinued        2 g 200 mL/hr over 30 Minutes Intravenous Every 24 hours 08/13/20 1154 08/20/20 1523   08/09/20 2200  ceFEPIme (MAXIPIME) 1 g in sodium chloride 0.9 % 100 mL IVPB  Status:  Discontinued        1 g 200 mL/hr over 30 Minutes Intravenous Every 24 hours 08/09/20 1238 08/13/20 1154   08/09/20 0000  ceFEPIme (MAXIPIME) 1 g in sodium chloride 0.9 % 100 mL IVPB  Status:  Discontinued        1 g 200 mL/hr over 30 Minutes Intravenous  Every 24 hours 08/08/20 2305 08/09/20 1238       Time spent: 25 minutes    Erin Hearing ANP  [facility] 7 am - 330 pm/M-F for direct patient care and secure chat Please refer to Amion for contact info 15  days

## 2020-08-23 NOTE — Progress Notes (Signed)
Nutrition Follow Up  DOCUMENTATION CODES:   Not applicable  INTERVENTION:    Continue 30 ml ProSource Plus BID, each supplement provides 100 kcals and 15 grams protein.   Continue Renal MVI   NUTRITION DIAGNOSIS:   Increased nutrient needs related to post-op healing as evidenced by estimated needs.  Ongoing  GOAL:   Patient will meet greater than or equal to 90% of their needs   Meeting   MONITOR:   PO intake,Supplement acceptance,Weight trends,Labs,I & O's  REASON FOR ASSESSMENT:   Rounds    ASSESSMENT:   Patient with PMH significant for polysubstance use, ruptured lumbar disc, and recent admission for aortic valve endocarditis with perivalvular abscess (see time line below). Left AMA and presents back to ED with AKI and back/knee pain from mechanical fall.   4/28 - intubated, TEE, CRRT initiated 4/29 - Cortrak placed (gastric tip) 5/04 - TEE 5/05 - extubated to BiPAP, CRRT discontinued (pt pulled dialysis catheter) 5/06 - CRRT restarted due to severe hyperkalemia 5/10 - s/p tunneled HD catheter placement 5/12 - s/p AVR, aortic root reconstruction, closure of aortic sinus track 5/16 - transition to iHD 5/17 - left AMA  Last HD was on 5/30. UOP remains adequate with 2L overnight. Per nephrology, no acute indication for HD at this time. Plan to d/c to LTAC for additional determinations.   Intake remains adequate. Last eight meal completions charted as 100%. Taking Prosource BID.   Admission weight: 131.5 kg  Current weight: 134.8 kg   Medications: aranesp, miralax, senokot Labs: Na 134 (L)   Diet Order:   Diet Order            Diet renal with fluid restriction Room service appropriate? Yes; Fluid consistency: Thin  Diet effective now                 EDUCATION NEEDS:   Education needs have been addressed  Skin:  Skin Integrity Issues:: Other (Comment),Unstageable,Incisions Unstageable: coccyx, R buttocks Incisions: chest Other: MASD-  perineum  Last BM:  5/30  Height:   Ht Readings from Last 1 Encounters:  08/08/20 '6\' 4"'$  (1.93 m)    Weight:   Wt Readings from Last 1 Encounters:  08/23/20 134.8 kg    BMI:  Body mass index is 36.17 kg/m.  Estimated Nutritional Needs:   Kcal:  2500-2700 kcal  Protein:  120-140 grams  Fluid:  1000 ml + UOP  Mariana Single RD, LDN Clinical Nutrition Pager listed in [place]

## 2020-08-23 NOTE — Progress Notes (Signed)
1500-Pt removed una boots. Stating his legs were itching and hurting. Patient with 2+ pitting edema in R lower extremity and 1-2+ edema in the left. Edema in Right is greater than left. Patient with palpable pedal pulses. Patient with red streaking marks to calf area along with patchy raised red rash. Pt states "rash was there before when una boots were changed"  Will monitor patient. Tiki Tucciarone, Bettina Gavia RN   [phone]- patient states legs feel much better. Wanting to keep leg wraps/una boots off until tomorrow.  Dr. Avon Gully aware. Will monitor patient. Margert Edsall, Bettina Gavia RN

## 2020-08-23 NOTE — Progress Notes (Signed)
Progress Note  Patient Name: Alex [name] Date of Encounter: 08/23/2020  Primary Cardiologist:   Minus Breeding, MD   Subjective   No pain or palpitations.    Inpatient Medications    Scheduled Meds: . (feeding supplement) PROSource Plus  30 mL Oral BID BM  . buprenorphine-naloxone  1 tablet Sublingual BID  . Chlorhexidine Gluconate Cloth  6 each Topical Daily  . collagenase   Topical Daily  . darbepoetin (ARANESP) injection - NON-DIALYSIS  150 mcg Subcutaneous Q Sat-1800  . iron polysaccharides  150 mg Oral Daily  . lidocaine  1 patch Transdermal Q24H  . melatonin  3 mg Oral QHS  . methocarbamol  500 mg Oral TID  . nicotine  14 mg Transdermal Daily  . polyethylene glycol  17 g Oral Daily  . senna-docusate  1 tablet Oral BID  . Warfarin - Pharmacist Dosing Inpatient   Does not apply q1600   Continuous Infusions: . ceFEPime (MAXIPIME) IV Stopped (08/22/20 2246)  . diltiazem (CARDIZEM) infusion 15 mg/hr (08/23/20 0605)  . heparin 3,250 Units/hr (08/23/20 [id number])   PRN Meds: acetaminophen **OR** acetaminophen, bisacodyl, clonazePAM, lactulose, ondansetron **OR** ondansetron (ZOFRAN) IV   Vital Signs    Vitals:   08/22/20 2028 08/22/20 2340 08/23/20 0347 08/23/20 0738  BP: 124/87 130/79 120/80 129/81  Pulse: 96 (!) 105 86 87  Resp: '20 20 18 16  '$ Temp: 99 F (37.2 C) 99.5 F (37.5 C) 99.5 F (37.5 C) 98.1 F (36.7 C)  TempSrc: Oral Oral Oral Oral  SpO2: 96%  97% 93%  Weight:   134.8 kg   Height:        Intake/Output Summary (Last 24 hours) at 08/23/2020 0820 Last data filed at 08/23/2020 [id number] Gross per 24 hour  Intake 1543.98 ml  Output 2005 ml  Net -461.02 ml   Filed Weights   08/21/20 0410 08/22/20 0639 08/23/20 0347  Weight: 133 kg 135.3 kg 134.8 kg    Telemetry    NSR- Personally Reviewed  ECG    NA - Personally Reviewed  Physical Exam   GEN: No acute distress.   Neck: No  JVD Cardiac: RRR, no murmurs, rubs, or gallops, mechanical S2.   Respiratory: Clear  to auscultation bilaterally. GI: Soft, nontender, non-distended  MS: No  edema; No deformity. Neuro:  Nonfocal  Psych: Normal affect   Labs    Chemistry Recent Labs  Lab 08/19/20 0309 08/20/20 0525 08/20/20 2327 08/22/20 1028 08/23/20 0155  NA 137 135 137 136 134*  K 3.9 3.5 3.6 3.4* 3.9  CL 101 106 107 103 100  CO2 '29 22 25 27 24  '$ GLUCOSE 81 107* 100* 116* 96  BUN 17 29* 33* 16 21*  CREATININE 2.90* 3.95* 4.44* 2.81* 3.25*  CALCIUM 8.4* 7.9* 8.0* 8.5* 8.2*  PROT 6.7 6.0* 5.9*  --   --   ALBUMIN 2.2* 2.0* 2.0*  --  1.9*  AST '17 16 16  '$ --   --   ALT '8 7 9  '$ --   --   ALKPHOS 62 50 53  --   --   BILITOT 0.8 0.7 0.5  --   --   GFRNONAA 28* 19* 17* 29* 24*  ANIONGAP '7 7 5 6 10     '$ Hematology Recent Labs  Lab 08/20/20 2327 08/22/20 0203 08/23/20 0155  WBC 7.7 7.8 9.5  RBC 2.72* 2.96* 2.63*  HGB 7.4* 8.0* 7.1*  HCT 24.7* 26.5* 23.4*  MCV 90.8 89.5 89.0  MCH 27.2 27.0 27.0  MCHC 30.0 30.2 30.3  RDW 15.2 14.8 15.2  PLT 226 212 197    Cardiac EnzymesNo results for input(s): TROPONINI in the last 168 hours. No results for input(s): TROPIPOC in the last 168 hours.   BNPNo results for input(s): BNP, PROBNP in the last 168 hours.   DDimer No results for input(s): DDIMER in the last 168 hours.   Radiology    No results found.  Cardiac Studies   NA  Patient Profile     40 y.o. male with a hx of HTN, polysubstance abuse (nicotine, heroin, methamphetamine), and Opioid use disorder on suboxone who is being seen 08/22/2020 for the evaluation of atrial flutter at the request of Dr. Darrick Meigs.  Assessment & Plan    Atrial flutter:  OK to discontinue Cardizem IV and change to PO.  OK to discontinue tele at 3 pm today.   Endocarditis:  AVR.  Antibiotics per primary team.     For questions or updates, please contact [place] Please consult [email or web address] for contact info under Cardiology/STEMI.   Signed, Minus Breeding, MD  08/23/2020, 8:20 AM

## 2020-08-24 DIAGNOSIS — I330 Acute and subacute infective endocarditis: Secondary | ICD-10-CM | POA: Diagnosis not present

## 2020-08-24 DIAGNOSIS — D631 Anemia in chronic kidney disease: Secondary | ICD-10-CM

## 2020-08-24 DIAGNOSIS — N189 Chronic kidney disease, unspecified: Secondary | ICD-10-CM

## 2020-08-24 DIAGNOSIS — I483 Typical atrial flutter: Secondary | ICD-10-CM | POA: Diagnosis not present

## 2020-08-24 LAB — RENAL FUNCTION PANEL
Albumin: 1.9 g/dL — ABNORMAL LOW (ref 3.5–5.0)
Anion gap: 8 (ref 5–15)
BUN: 30 mg/dL — ABNORMAL HIGH (ref 6–20)
CO2: 26 mmol/L (ref 22–32)
Calcium: 8.2 mg/dL — ABNORMAL LOW (ref 8.9–10.3)
Chloride: 101 mmol/L (ref 98–111)
Creatinine, Ser: 3.89 mg/dL — ABNORMAL HIGH (ref 0.61–1.24)
GFR, Estimated: 19 mL/min — ABNORMAL LOW (ref >60)
Glucose, Bld: 110 mg/dL — ABNORMAL HIGH (ref 70–99)
Phosphorus: 3.7 mg/dL (ref 2.5–4.6)
Potassium: 3.7 mmol/L (ref 3.5–5.1)
Sodium: 135 mmol/L (ref 135–145)

## 2020-08-24 LAB — CBC
HCT: 22 % — ABNORMAL LOW (ref 39.0–52.0)
Hemoglobin: 6.8 g/dL — CL (ref 13.0–17.0)
MCH: 27.5 pg (ref 26.0–34.0)
MCHC: 30.9 g/dL (ref 30.0–36.0)
MCV: 89.1 fL (ref 80.0–100.0)
Platelets: 198 10*3/uL (ref 150–400)
RBC: 2.47 MIL/uL — ABNORMAL LOW (ref 4.22–5.81)
RDW: 15.3 % (ref 11.5–15.5)
WBC: 8.1 10*3/uL (ref 4.0–10.5)
nRBC: 0 % (ref 0.0–0.2)

## 2020-08-24 LAB — MAGNESIUM: Magnesium: 1.9 mg/dL (ref 1.7–2.4)

## 2020-08-24 LAB — HEMOGLOBIN AND HEMATOCRIT, BLOOD
HCT: 25.2 % — ABNORMAL LOW (ref 39.0–52.0)
Hemoglobin: 7.7 g/dL — ABNORMAL LOW (ref 13.0–17.0)

## 2020-08-24 LAB — PROTIME-INR
INR: 3.1 — ABNORMAL HIGH (ref 0.8–1.2)
Prothrombin Time: 31.5 seconds — ABNORMAL HIGH (ref 11.4–15.2)

## 2020-08-24 LAB — PREPARE RBC (CROSSMATCH)

## 2020-08-24 MED ORDER — WARFARIN SODIUM 5 MG PO TABS
5.0000 mg | ORAL_TABLET | Freq: Once | ORAL | Status: AC
  Administered 2020-08-24: 5 mg via ORAL
  Filled 2020-08-24: qty 1

## 2020-08-24 MED ORDER — SODIUM CHLORIDE 0.9% IV SOLUTION: Freq: Once | INTRAVENOUS | Status: AC

## 2020-08-24 MED ORDER — FUROSEMIDE 10 MG/ML IJ SOLN
40.0000 mg | Freq: Once | INTRAMUSCULAR | Status: AC
  Administered 2020-08-24: 40 mg via INTRAVENOUS
  Filled 2020-08-24: qty 4

## 2020-08-24 NOTE — Progress Notes (Signed)
HOSPITAL MEDICINE OVERNIGHT EVENT NOTE    Notified by nursing the patient's hemoglobin this morning is 6.8.  Per my discussion with nursing patient denies shortness of breath, denies chest pain.  Vital signs are stable.  There is no clinical evidence of gross bleeding.  INR is 3.1 however patient is chronically on Coumadin therapy for history of mechanical heart valve.  Ordered type and screen, 1 unit packed red blood cell transfusion with posttransfusion H&H.  Vernelle Emerald  MD [facility]

## 2020-08-24 NOTE — Progress Notes (Signed)
Progress Note  Patient Name: Alex [name] Date of Encounter: 08/24/2020  Primary Cardiologist:   Minus Breeding, MD   Subjective   No palpitations.  Walking in the hallway.   Inpatient Medications    Scheduled Meds: . (feeding supplement) PROSource Plus  30 mL Oral BID BM  . buprenorphine-naloxone  1 tablet Sublingual BID  . Chlorhexidine Gluconate Cloth  6 each Topical Daily  . collagenase   Topical Daily  . darbepoetin (ARANESP) injection - NON-DIALYSIS  150 mcg Subcutaneous Q Sat-1800  . diltiazem  180 mg Oral Daily  . iron polysaccharides  150 mg Oral Daily  . lidocaine  1 patch Transdermal Q24H  . melatonin  3 mg Oral QHS  . methocarbamol  500 mg Oral TID  . nicotine  14 mg Transdermal Daily  . polyethylene glycol  17 g Oral Daily  . senna-docusate  1 tablet Oral BID  . Warfarin - Pharmacist Dosing Inpatient   Does not apply q1600   Continuous Infusions: . ceFEPime (MAXIPIME) IV Stopped (08/24/20 0031)   PRN Meds: acetaminophen **OR** acetaminophen, bisacodyl, clonazePAM, lactulose, ondansetron **OR** ondansetron (ZOFRAN) IV   Vital Signs    Vitals:   08/24/20 0356 08/24/20 0630 08/24/20 0700 08/24/20 0733  BP:  118/79 125/85 126/75  Pulse:   88 95  Resp:  '16 18 18  '$ Temp:  98 F (36.7 C) 98.2 F (36.8 C) 98.1 F (36.7 C)  TempSrc:  Oral Oral Oral  SpO2:  94% 97% 96%  Weight: 135.3 kg     Height:        Intake/Output Summary (Last 24 hours) at 08/24/2020 0820 Last data filed at 08/24/2020 0700 Gross per 24 hour  Intake 282.76 ml  Output 2650 ml  Net -2367.24 ml   Filed Weights   08/22/20 0639 08/23/20 0347 08/24/20 0356  Weight: 135.3 kg 134.8 kg 135.3 kg    Telemetry    NA - Personally Reviewed  ECG    NA - Personally Reviewed  Physical Exam    Labs    Chemistry Recent Labs  Lab 08/19/20 0309 08/20/20 0525 08/20/20 2327 08/22/20 1028 08/23/20 0155 08/24/20 0128  NA 137 135 137 136 134* 135  K 3.9 3.5 3.6 3.4* 3.9 3.7  CL  101 106 107 103 100 101  CO2 '29 22 25 27 24 26  '$ GLUCOSE 81 107* 100* 116* 96 110*  BUN 17 29* 33* 16 21* 30*  CREATININE 2.90* 3.95* 4.44* 2.81* 3.25* 3.89*  CALCIUM 8.4* 7.9* 8.0* 8.5* 8.2* 8.2*  PROT 6.7 6.0* 5.9*  --   --   --   ALBUMIN 2.2* 2.0* 2.0*  --  1.9* 1.9*  AST '17 16 16  '$ --   --   --   ALT '8 7 9  '$ --   --   --   ALKPHOS 62 50 53  --   --   --   BILITOT 0.8 0.7 0.5  --   --   --   GFRNONAA 28* 19* 17* 29* 24* 19*  ANIONGAP '7 7 5 6 10 8     '$ Hematology Recent Labs  Lab 08/22/20 0203 08/23/20 0155 08/24/20 0128  WBC 7.8 9.5 8.1  RBC 2.96* 2.63* 2.47*  HGB 8.0* 7.1* 6.8*  HCT 26.5* 23.4* 22.0*  MCV 89.5 89.0 89.1  MCH 27.0 27.0 27.5  MCHC 30.2 30.3 30.9  RDW 14.8 15.2 15.3  PLT 212 197 198    Cardiac EnzymesNo results for  input(s): TROPONINI in the last 168 hours. No results for input(s): TROPIPOC in the last 168 hours.   BNPNo results for input(s): BNP, PROBNP in the last 168 hours.   DDimer No results for input(s): DDIMER in the last 168 hours.   Radiology    No results found.  Cardiac Studies   NA  Patient Profile     40 y.o. male with a hx of HTN, polysubstance abuse (nicotine, heroin, methamphetamine), and Opioid use disorder on suboxone who is being seen 08/22/2020 for the evaluation of atrial flutter at the request of Dr. Darrick Meigs.  Assessment & Plan    Atrial flutter:    Changed from IV to PO Cardizem yesterday.  No further flutter.  We will follow as needed.  Please call with further questions.   [facility] will sign off.   Medication Recommendations:  As on MAR Other recommendations (labs, testing, etc):  None Follow up as an outpatient:  We will arrange follow up as an outpatient.  Please notify us when he is ready for discharge.    For questions or updates, please contact [place] Please consult [email or web address] for contact info under Cardiology/STEMI.   Signed, Minus Breeding, MD  08/24/2020, 8:20 AM

## 2020-08-24 NOTE — Progress Notes (Signed)
   08/24/20 0320  Provider Notification  Provider Name/Title Shalhoub MD  Date Provider Notified 08/24/20  Time Provider Notified 0320  Notification Type Page  Notification Reason Critical result  Test performed and critical result Hgb 6.8  Date Critical Result Received 08/24/20  Time Critical Result Received 0318  Provider response See new orders  Date of Provider Response 08/24/20  Time of Provider Response 0356   1 unit of RBCs ordered

## 2020-08-24 NOTE — Progress Notes (Signed)
Patient ID: Alex [name], male   DOB: 1980/10/04, 40 y.o.   MRN: [id number] [facility] Progress Note   Assessment/ Plan:   1. Acute kidney Injury: With dense ATN related to SBE/postoperative course.  Earlier on CRRT and transitioned to intermittent hemodialysis starting 5/16.  His last hemodialysis was on Monday 5/30 and he continues to have robust urine output which today I will augment with additional diuretics to try and help volume unloading a little faster.  He does not have any indications for dialysis today.  He appears suitable for transfer/admission to an LTAC at this point with his [facility] and surveillance of labs for renal recovery versus dialysis. 2.  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 3.  Anemia: Likely secondary to acute/critical illness and perioperative losses.  On Aranesp/iron supplementation with plans for PRBC transfusion today. 4.  Hypertension: Blood pressure under decent control, continue to monitor with UF/HD. 5.  Atrial flutter with RVR: Seen by cardiology and has been transitioned from intravenous diltiazem to oral.  Subjective:   He continues to feel well and is bothered by skin rash/reaction in his legs from [employer].   Objective:   BP (!) 129/91   Pulse 99   Temp 97.7 F (36.5 C) (Oral)   Resp 17   Ht '6\' 4"'$  (1.93 m)   Wt 135.3 kg   SpO2 96%   BMI 36.31 kg/m   Intake/Output Summary (Last 24 hours) at 08/24/2020 0904 Last data filed at 08/24/2020 0840 Gross per 24 hour  Intake 597.76 ml  Output 2650 ml  Net -2052.24 ml   Weight change: 0.5 kg  Physical Exam: Gen: Appears comfortable resting flat in bed CVS: Pulse irregular tachycardia, mechanical click audible over the precordium. Resp: Clear to auscultation bilaterally, no rales/rhonchi.  Right IJ TDC in place Abd: Soft, obese, nontender, bowel sounds normal Ext: 3+ bilateral  lower extremity edema with erythematous banding consistent with Unna boot dressing.  Imaging: No results found.  Labs: BMET Recent Labs  Lab 08/18/20 0100 08/19/20 0309 08/20/20 0525 08/20/20 2327 08/22/20 0203 08/22/20 1028 08/23/20 0155 08/24/20 0128  NA 136 137 135 137  --  136 134* 135  K 3.8 3.9 3.5 3.6  --  3.4* 3.9 3.7  CL 102 101 106 107  --  103 100 101  CO2 '24 29 22 25  '$ --  '27 24 26  '$ GLUCOSE 87 81 107* 100*  --  116* 96 110*  BUN 47* 17 29* 33*  --  16 21* 30*  CREATININE 5.43* 2.90* 3.95* 4.44*  --  2.81* 3.25* 3.89*  CALCIUM 8.0* 8.4* 7.9* 8.0*  --  8.5* 8.2* 8.2*  PHOS 6.4* 2.9 4.2 4.3 2.4*  --  3.1 3.7   CBC Recent Labs  Lab 08/20/20 2327 08/22/20 0203 08/23/20 0155 08/24/20 0128  WBC 7.7 7.8 9.5 8.1  HGB 7.4* 8.0* 7.1* 6.8*  HCT 24.7* 26.5* 23.4* 22.0*  MCV 90.8 89.5 89.0 89.1  PLT 226 212 197 198    Medications:    . (feeding supplement) PROSource Plus  30 mL Oral BID BM  . buprenorphine-naloxone  1 tablet Sublingual BID  . Chlorhexidine Gluconate Cloth  6 each Topical Daily  . collagenase   Topical Daily  . darbepoetin (ARANESP) injection - NON-DIALYSIS  150 mcg Subcutaneous Q Sat-1800  . diltiazem  180 mg Oral Daily  . iron polysaccharides  150  mg Oral Daily  . lidocaine  1 patch Transdermal Q24H  . melatonin  3 mg Oral QHS  . methocarbamol  500 mg Oral TID  . nicotine  14 mg Transdermal Daily  . polyethylene glycol  17 g Oral Daily  . senna-docusate  1 tablet Oral BID  . warfarin  5 mg Oral ONCE-1600  . Warfarin - Pharmacist Dosing Inpatient   Does not apply q1600   Elmarie Shiley, MD 08/24/2020, 9:04 AM

## 2020-08-24 NOTE — Progress Notes (Signed)
ANTICOAGULATION CONSULT NOTE - Follow Up Consult  Pharmacy Consult for Warfarin Indication: mechanical aortic valve  Labs: Recent Labs    08/22/20 0203 08/22/20 1028 08/22/20 2110 08/23/20 0155 08/24/20 0128  HGB 8.0*  --   --  7.1* 6.8*  HCT 26.5*  --   --  23.4* 22.0*  PLT 212  --   --  197 198  LABPROT 24.9*  --   --  31.6* 31.5*  INR 2.3*  --   --  3.1* 3.1*  HEPARINUNFRC  --  0.19* 0.24* 0.14*  --   CREATININE  --  2.81*  --  3.25* 3.89*   Assessment: 39yom with recent endocarditis> AV root repair and Mechanical AVR on 08/03/20. Warfarin started 5/13. Left AMA 5/16 pm then returned 5/17 pm. INR 2.6 on 5/18 and Warfarin resumed. Pharmacy was consulted to bridge warfarin with heparin until INR >2.5 again. Heparin stopped 6/1.  Today's INR remains 3.1.  No overt bleeding or complications noted, but Hgb down to 6.8 overnight - getting 1 unit PRBCs now.  Goal of Therapy:  INR 2.5-3.5 per Dr. Kipp Brood Monitor platelets by anticoagulation protocol: Yes   Plan:  Repeat warfarin 5 mg x 1 again tonight. Daily PT/INR. Watch Hgb, watch for s/s bleeding.  [place] Crane, Roylene Reason, BCCP Clinical Pharmacist  08/24/2020 9:00 AM   [facility] pharmacy phone numbers are listed on [email or web address]

## 2020-08-24 NOTE — Progress Notes (Signed)
[facility] PROGRESS NOTE  Alex [name] [id number] DOB: 02-06-1981 DOA: 08/08/2020 PCP: [facility]  Status: Remains inpatient appropriate because:Unsafe d/c plan, IV treatments appropriate due to intensity of illness or inability to take PO and Inpatient level of care appropriate due to severity of illness   Dispo: The patient is from: Home              Anticipated d/c is to: Home after LTAC              Patient currently is not medically stable to d/c.   Difficult to place patient Yes   Level of care: Med-Surg  Code Status: Full Family Communication:  DVT prophylaxis: Heparin infusion COVID vaccination status: Received first dose of Pfizer on 08/16/2020  HPI: 40 year old male with history of polysubstance abuse, aortic valve endocarditis, Serratia bacteremia, septic emboli causing stroke and acute kidney injury requiring hemodialysis.  Patient left hospital AMA on 08/08/2020.  He came back same night after discussion with CT surgery.  Significant events:  4/26 transfer from [facility] to [facility]  CT head 4/26 >> no acute findings  CT abd/pelvis 4/26 >> cholelithiasis w/o inflammatory chagnes, mild hepatomegaly (25 cm), moderate splenomegaly (16.7 cm), 2 mm non obstructing kidney stone on Lt  4/27 CVL placed. Pressors escalating.   4/28 intubated for TEE, CVC, HD, and A-line placed. CRRT started   Remains on CRRT  MRI on 4/30-multiple infarcts  5/4: repeat TEE shows an aortic valve abscess  5/5: extubated , self-removed HD cath so CRRT holiday> developed severe hyperkalemia overnight.  5/6: new temp HD cath placed   5/8: new temp HD cath placed after prior one self-removed by patient  5/9: Orthopantogram neg for abscess  5/10: HD cath replaced by IR [facility]  5/12 to OR for AVR, aortic root reconstruction, closure of aortic sinus track. To 2H ICU on vent post op. 4 units PRBC given intraop.   5/13 extubated, on CRRT, remains  on pressors  5/18, left AMA.  5/30 continues on intermittent hemodialysis as directed by nephrology team.  Continues with excellent urinary output.  Creatinine between 2.9 and 4.5  Subjective: Sitting on side of bed. Reports sore areas on legs from [facility] boots. Said renal may give some Lasix for LE edema  Objective: Vitals:   08/24/20 0700 08/24/20 0733  BP: 125/85 126/75  Pulse: 88 95  Resp: 18 18  Temp: 98.2 F (36.8 C) 98.1 F (36.7 C)  SpO2: 97% 96%    Intake/Output Summary (Last 24 hours) at 08/24/2020 [id number] Last data filed at 08/24/2020 0700 Gross per 24 hour  Intake 282.76 ml  Output 2650 ml  Net -2367.24 ml   Filed Weights   08/22/20 0639 08/23/20 0347 08/24/20 0356  Weight: 135.3 kg 134.8 kg 135.3 kg    Exam:  Constitutional: Appears chronically, more pale than baseline Respiratory: Lungs clear posteriorly, RA, O2 sats 99% Cardiovascular: R>L LE edmea but overall significantly in proved.  UNNA boots removed-no ulcers on either leg but there are linear reddish areas on both legs c/w wrappings from [employer] botos  Abdomen:  LBM 6/1, soft, BS+, eating well Genitourinary: Voids easily Neurologic: CN 2-12 grossly intact. Sensation intact, DTR normal. Strength 5/5 x all 4 extremities.  Psychiatric: A/& O x 3, pleasant affect   Assessment/Plan: Acute problems: Aortic valve endocarditis with perivalvular abscess-Serratia bacteremia, septic emboli causing strokes.   -Presented with sepsis physiology on 4/26 eventually was transferred to ICU, was intubated.   -  MRI brain on 07/22/2020 revealed multiple acute infarcts.   -Blood cultures positive for Serratia and Echocardiogram showed severe AI and AR with AV vegetation confirmed by TEE.   -Now s/p mechanical aortic valve replacement, aortic root construction and closure of aortic sinus tract.   -Needs 6 weeks of cefepime per ID with start date from 08/04/2020. -Continue Coumadin per pharmacy.    -Patient left AMA on 08/07/2020  however came back to hospital after speaking with CT surgery on 08/08/2020.  Atrial fibrillation/flutter with RVR -Cardiology assistance -Currently rate controlled on Cardizem and metoprolol -Maintaining SR/ST -Continue warfarin   Acute kidney injury -likely from ATN from infection -Nephrology following  -Int HD on hold-Cr stable -Renal has given 1x dose IV Lasix today 6/2 -UOP averaging about 2 L per 24-hour  Anemia/thrombocytopenia -patient is s/p 6 units PRBC during last hospital stay.   -Transfuse PRBC as needed for hemoglobin less than 7. -Hemoglobin 6.8 today so will receive 1 unit PRBCs -continue Aranesp  Polysubstance abuse -patient has amphetamine abuse, nicotine dependence, opiate dependence on agonist therapy.  He is on chronic Suboxone.  Bilateral lower extremity edema -Markedly improved  -Irritation from UNNA boots so will dc in favor of ACE wraps (wrap in Kerlix before applying ACE due to prexisting skin irritation)  Obesity BMI-34.14kg/m    Data Reviewed: Basic Metabolic Panel: Recent Labs  Lab 08/20/20 0525 08/20/20 2327 08/22/20 0203 08/22/20 1028 08/23/20 0155 08/24/20 0128  NA 135 137  --  136 134* 135  K 3.5 3.6  --  3.4* 3.9 3.7  CL 106 107  --  103 100 101  CO2 22 25  --  '27 24 26  '$ GLUCOSE 107* 100*  --  116* 96 110*  BUN 29* 33*  --  16 21* 30*  CREATININE 3.95* 4.44*  --  2.81* 3.25* 3.89*  CALCIUM 7.9* 8.0*  --  8.5* 8.2* 8.2*  MG 1.9 1.9 1.8  --  1.8 1.9  PHOS 4.2 4.3 2.4*  --  3.1 3.7   Liver Function Tests: Recent Labs  Lab 08/18/20 0100 08/19/20 0309 08/20/20 0525 08/20/20 2327 08/23/20 0155 08/24/20 0128  AST 14* '17 16 16  '$ --   --   ALT '6 8 7 9  '$ --   --   ALKPHOS 51 62 50 53  --   --   BILITOT 0.9 0.8 0.7 0.5  --   --   PROT 6.0* 6.7 6.0* 5.9*  --   --   ALBUMIN 1.9* 2.2* 2.0* 2.0* 1.9* 1.9*     CBC: Recent Labs  Lab 08/20/20 0525 08/20/20 2327 08/22/20 0203 08/23/20 0155 08/24/20 0128  WBC 6.8 7.7 7.8 9.5  8.1  HGB 7.3* 7.4* 8.0* 7.1* 6.8*  HCT 24.2* 24.7* 26.5* 23.4* 22.0*  MCV 91.3 90.8 89.5 89.0 89.1  PLT 226 226 212 197 198    BNP (last 3 results) Recent Labs    07/25/20 0334 07/26/20 0331  BNP 153.5* 76.0     Continuous Infusions: . ceFEPime (MAXIPIME) IV Stopped (08/24/20 0031)    Principal Problem:   Endocarditis Active Problems:   Opioid use disorder   Acute renal failure with oliguria (HCC)   Cerebral embolism with cerebral infarction   Anemia   Acute kidney injury (AKI) with acute tubular necrosis (ATN) (HCC)   Atrial fibrillation with RVR [facility])   Consultants:  Infectious disease  PCCM  Neurology  CT surgery  Procedures:  Echocardiogram  TEE  CRRT  Cortrack  Antibiotics: Anti-infectives (From admission, onward)   Start     Dose/Rate Route Frequency Ordered Stop   08/20/20 2200  ceFEPIme (MAXIPIME) 1 g in sodium chloride 0.9 % 100 mL IVPB        1 g 200 mL/hr over 30 Minutes Intravenous Every 24 hours 08/20/20 1523     08/13/20 2200  ceFEPIme (MAXIPIME) 2 g in sodium chloride 0.9 % 100 mL IVPB  Status:  Discontinued        2 g 200 mL/hr over 30 Minutes Intravenous Every 24 hours 08/13/20 1154 08/20/20 1523   08/09/20 2200  ceFEPIme (MAXIPIME) 1 g in sodium chloride 0.9 % 100 mL IVPB  Status:  Discontinued        1 g 200 mL/hr over 30 Minutes Intravenous Every 24 hours 08/09/20 1238 08/13/20 1154   08/09/20 0000  ceFEPIme (MAXIPIME) 1 g in sodium chloride 0.9 % 100 mL IVPB  Status:  Discontinued        1 g 200 mL/hr over 30 Minutes Intravenous Every 24 hours 08/08/20 2305 08/09/20 1238       Time spent: 25 minutes    Erin Hearing ANP  [facility] 7 am - 330 pm/M-F for direct patient care and secure chat Please refer to Amion for contact info 16  days

## 2020-08-25 DIAGNOSIS — I330 Acute and subacute infective endocarditis: Secondary | ICD-10-CM | POA: Diagnosis not present

## 2020-08-25 DIAGNOSIS — I483 Typical atrial flutter: Secondary | ICD-10-CM

## 2020-08-25 LAB — RENAL FUNCTION PANEL
Albumin: 1.9 g/dL — ABNORMAL LOW (ref 3.5–5.0)
Anion gap: 6 (ref 5–15)
BUN: 35 mg/dL — ABNORMAL HIGH (ref 6–20)
CO2: 25 mmol/L (ref 22–32)
Calcium: 8.2 mg/dL — ABNORMAL LOW (ref 8.9–10.3)
Chloride: 105 mmol/L (ref 98–111)
Creatinine, Ser: 4.19 mg/dL — ABNORMAL HIGH (ref 0.61–1.24)
GFR, Estimated: 18 mL/min — ABNORMAL LOW (ref >60)
Glucose, Bld: 85 mg/dL (ref 70–99)
Phosphorus: 4.1 mg/dL (ref 2.5–4.6)
Potassium: 3.7 mmol/L (ref 3.5–5.1)
Sodium: 136 mmol/L (ref 135–145)

## 2020-08-25 LAB — CBC
HCT: 20.9 % — ABNORMAL LOW (ref 39.0–52.0)
Hemoglobin: 6.5 g/dL — CL (ref 13.0–17.0)
MCH: 27 pg (ref 26.0–34.0)
MCHC: 31.1 g/dL (ref 30.0–36.0)
MCV: 86.7 fL (ref 80.0–100.0)
Platelets: 197 10*3/uL (ref 150–400)
RBC: 2.41 MIL/uL — ABNORMAL LOW (ref 4.22–5.81)
RDW: 16.1 % — ABNORMAL HIGH (ref 11.5–15.5)
WBC: 7.5 10*3/uL (ref 4.0–10.5)
nRBC: 0 % (ref 0.0–0.2)

## 2020-08-25 LAB — OCCULT BLOOD X 1 CARD TO LAB, STOOL: Fecal Occult Bld: NEGATIVE

## 2020-08-25 LAB — HEMOGLOBIN AND HEMATOCRIT, BLOOD
HCT: 26.6 % — ABNORMAL LOW (ref 39.0–52.0)
Hemoglobin: 8.4 g/dL — ABNORMAL LOW (ref 13.0–17.0)

## 2020-08-25 LAB — MAGNESIUM: Magnesium: 1.9 mg/dL (ref 1.7–2.4)

## 2020-08-25 LAB — PROTIME-INR
INR: 3.2 — ABNORMAL HIGH (ref 0.8–1.2)
Prothrombin Time: 32.8 seconds — ABNORMAL HIGH (ref 11.4–15.2)

## 2020-08-25 LAB — LACTATE DEHYDROGENASE: LDH: 203 U/L — ABNORMAL HIGH (ref 98–192)

## 2020-08-25 LAB — PREPARE RBC (CROSSMATCH)

## 2020-08-25 MED ORDER — SODIUM CHLORIDE 0.9% IV SOLUTION: Freq: Once | INTRAVENOUS | Status: AC

## 2020-08-25 MED ORDER — FAMOTIDINE 20 MG PO TABS
20.0000 mg | ORAL_TABLET | Freq: Two times a day (BID) | ORAL | Status: DC
  Administered 2020-08-25 – 2020-09-15 (×43): 20 mg via ORAL
  Filled 2020-08-25 (×43): qty 1

## 2020-08-25 MED ORDER — WARFARIN SODIUM 5 MG PO TABS: 5.0000 mg | ORAL_TABLET | Freq: Once | ORAL | Status: DC

## 2020-08-25 MED ORDER — CEFEPIME HCL 2 G IJ SOLR
2.0000 g | Freq: Two times a day (BID) | INTRAMUSCULAR | Status: DC
  Administered 2020-08-25 – 2020-09-02 (×16): 2 g via INTRAVENOUS
  Filled 2020-08-25 (×15): qty 2

## 2020-08-25 NOTE — Progress Notes (Signed)
Mobility Specialist - Progress Note   08/25/20 1447  Mobility  Activity Ambulated in hall  Level of Assistance Independent  Assistive Device None  Distance Ambulated (ft) 470 ft  Mobility Ambulated with assistance in hallway  Mobility Response Tolerated well  Mobility performed by Mobility specialist  $Mobility charge 1 Mobility   Pt asx throughout ambulation. Pt back in bed after walk, call bell at side.   Pricilla Handler Mobility Specialist Mobility Specialist Phone: [phone]

## 2020-08-25 NOTE — Progress Notes (Signed)
Patient ID: Alex [name], male   DOB: 03/15/1981, 40 y.o.   MRN: [id number] [facility] Progress Note   Assessment/ Plan:   1. Acute kidney Injury: With dense ATN related to SBE/postoperative course.  Earlier on CRRT and transitioned to intermittent hemodialysis starting 5/16.  His last hemodialysis was on Monday 5/30 and he continues to have robust urine output-this was additionally augmented yesterday with diuretic to help promote volume unloading given presence of pedal edema without respiratory involvement.  I will allow auto diuresis today and continue to monitor for renal recovery-no acute dialysis needs at this time.  Awaiting insurance approval for admission to [place]. 2.  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 3.  Anemia: Likely secondary to acute/critical illness and perioperative losses.  On Aranesp/iron supplementation with PRBC transfusion overnight for acute hemoglobin/hematocrit drop.  Primary service evaluating for occult loss including hemolysis. 4.  Hypertension: Blood pressure under decent control, continue to monitor with UF/HD. 5.  Atrial flutter with RVR: Seen by cardiology and has been transitioned from intravenous diltiazem to oral.  Subjective:   He continues to feel well and reports that he had noticeably improved urine output yesterday with furosemide and some improvement of his leg swelling   Objective:   BP (!) 136/92 (BP Location: Left Arm)   Pulse 92   Temp 98.3 F (36.8 C) (Oral)   Resp 19   Ht '6\' 4"'$  (1.93 m)   Wt (!) 137.2 kg   SpO2 96%   BMI 36.81 kg/m   Intake/Output Summary (Last 24 hours) at 08/25/2020 0916 Last data filed at 08/25/2020 0856 Gross per 24 hour  Intake 1376 ml  Output 4845 ml  Net -3469 ml   Weight change: 1.868 kg  Physical Exam: Gen: Appears comfortable resting flat in bed CVS: Pulse irregular tachycardia,  mechanical click audible over the precordium. Resp: Clear to auscultation bilaterally, no rales/rhonchi.  Right IJ TDC in place Abd: Soft, obese, nontender, bowel sounds normal Ext: 2+ bilateral lower extremity edema with Unna boots.  Imaging: No results found.  Labs: BMET Recent Labs  Lab 08/19/20 0309 08/20/20 0525 08/20/20 2327 08/22/20 0203 08/22/20 1028 08/23/20 0155 08/24/20 0128 08/25/20 0203  NA 137 135 137  --  136 134* 135 136  K 3.9 3.5 3.6  --  3.4* 3.9 3.7 3.7  CL 101 106 107  --  103 100 101 105  CO2 '29 22 25  '$ --  '27 24 26 25  '$ GLUCOSE 81 107* 100*  --  116* 96 110* 85  BUN 17 29* 33*  --  16 21* 30* 35*  CREATININE 2.90* 3.95* 4.44*  --  2.81* 3.25* 3.89* 4.19*  CALCIUM 8.4* 7.9* 8.0*  --  8.5* 8.2* 8.2* 8.2*  PHOS 2.9 4.2 4.3 2.4*  --  3.1 3.7 4.1   CBC Recent Labs  Lab 08/22/20 0203 08/23/20 0155 08/24/20 0128 08/24/20 1016 08/25/20 0203  WBC 7.8 9.5 8.1  --  7.5  HGB 8.0* 7.1* 6.8* 7.7* 6.5*  HCT 26.5* 23.4* 22.0* 25.2* 20.9*  MCV 89.5 89.0 89.1  --  86.7  PLT 212 197 198  --  197    Medications:    . (feeding supplement) PROSource Plus  30 mL Oral BID BM  . buprenorphine-naloxone  1 tablet Sublingual BID  . Chlorhexidine Gluconate Cloth  6 each Topical Daily  . collagenase   Topical Daily  .  darbepoetin (ARANESP) injection - NON-DIALYSIS  150 mcg Subcutaneous Q Sat-1800  . diltiazem  180 mg Oral Daily  . famotidine  20 mg Oral BID  . iron polysaccharides  150 mg Oral Daily  . lidocaine  1 patch Transdermal Q24H  . melatonin  3 mg Oral QHS  . methocarbamol  500 mg Oral TID  . nicotine  14 mg Transdermal Daily  . polyethylene glycol  17 g Oral Daily  . senna-docusate  1 tablet Oral BID  . warfarin  5 mg Oral ONCE-1600  . Warfarin - Pharmacist Dosing Inpatient   Does not apply q1600   Elmarie Shiley, MD 08/25/2020, 9:16 AM

## 2020-08-25 NOTE — TOC Progression Note (Signed)
Transition of Care [facility]) - Progression Note    Patient Details  Name: Alex [name] MRN: [id number] Date of Birth: September 15, 1980  Transition of Care [facility]) CM/SW [place], RN Phone Number: 08/25/2020, 7:57 AM  Clinical Narrative:    CM called and spoke with [name], CM with [facility] and the facility is expecting to hear back from Insurance authorization today and she will update on determination.  Erin Hearing, NP was notified.  CM and MSW will continue to follow the patient for LTAC placement.   Expected Discharge Plan: Long Term Acute Care (LTAC) Barriers to Discharge: Continued Medical Work up  Expected Discharge Plan and Services Expected Discharge Plan: [place] (LTAC)   Discharge Planning Services: CM Consult Post Acute Care Choice: Long Term Acute Care (LTAC) Living arrangements for the past 2 months: Single Family Home                                       Social Determinants of Health (SDOH) Interventions    Readmission Risk Interventions Readmission Risk Prevention Plan 08/25/2020  Medication Review (RN Care Manager) Complete  SW Recovery Care/Counseling Consult Complete  Skilled Nursing Facility Complete  Some recent data might be hidden

## 2020-08-25 NOTE — Progress Notes (Signed)
   08/25/20 0321  Provider Notification  Provider Name/Title Shalhoub MD  Date Provider Notified 08/25/20  Time Provider Notified 0321  Notification Type Page  Notification Reason Critical result  Test performed and critical result Hgb 6.5  Date Critical Result Received 08/25/20  Time Critical Result Received 0321  Provider response See new orders  Date of Provider Response 08/25/20  Time of Provider Response 0343   Pt w/ no sign of active bleed. Asymptomatic. Another 1 unit blood ordered.

## 2020-08-25 NOTE — Progress Notes (Signed)
ANTICOAGULATION CONSULT NOTE - Follow Up Consult  Pharmacy Consult for Warfarin Indication: mechanical aortic valve  Labs: Recent Labs    08/22/20 1028 08/22/20 1028 08/22/20 2110 08/23/20 0155 08/24/20 0128 08/24/20 1016 08/25/20 0203  HGB  --    < >  --  7.1* 6.8* 7.7* 6.5*  HCT  --    < >  --  23.4* 22.0* 25.2* 20.9*  PLT  --   --   --  197 198  --  197  LABPROT  --   --   --  31.6* 31.5*  --  32.8*  INR  --   --   --  3.1* 3.1*  --  3.2*  HEPARINUNFRC 0.19*  --  0.24* 0.14*  --   --   --   CREATININE 2.81*  --   --  3.25* 3.89*  --  4.19*   < > = values in this interval not displayed.   Assessment: 39yom with recent endocarditis> AV root repair and Mechanical AVR on 08/03/20. Warfarin started 5/13. Left AMA 5/16 pm then returned 5/17 pm. INR 2.6 on 5/18 and Warfarin resumed. Pharmacy was consulted to bridge warfarin with heparin until INR >2.5 again. Heparin stopped 6/1.  Today's INR remains therapeutic at 3.2.  No overt bleeding or complications noted, but Hgb down to 6.5 overnight after getting 1 unit PRBCs yesterday. Transfusion of 1 unit PRBCs  Now.  PLTC is within normal/ stable.  Goal of Therapy:  INR 2.5-3.5 per Dr. Kipp Brood Monitor platelets by anticoagulation protocol: Yes   Plan:  Repeat warfarin 5 mg x 1 tonight. Daily PT/INR. Watch Hgb, watch for s/s bleeding.  Nicole Cella, RPh Clinical Pharmacist  08/25/2020 7:57 AM   [facility] pharmacy phone numbers are listed on [email or web address]

## 2020-08-25 NOTE — Progress Notes (Addendum)
[facility] PROGRESS NOTE  DMIR RIEF [id number] DOB: 1981-02-10 DOA: 08/08/2020 PCP: [facility]  Status: Remains inpatient appropriate because:Unsafe d/c plan, IV treatments appropriate due to intensity of illness or inability to take PO and Inpatient level of care appropriate due to severity of illness   Dispo: The patient is from: Home              Anticipated d/c is to: Home after LTAC              Patient currently is not medically stable to d/c.   Difficult to place patient Yes   Level of care: Med-Surg  Code Status: Full Family Communication:  DVT prophylaxis: Heparin infusion COVID vaccination status: Received first dose of Pfizer on 08/16/2020  HPI: 40 year old male with history of polysubstance abuse, aortic valve endocarditis, Serratia bacteremia, septic emboli causing stroke and acute kidney injury requiring hemodialysis.  Patient left hospital AMA on 08/08/2020.  He came back same night after discussion with CT surgery.  Significant events:  4/26 transfer from [facility] to [facility]  CT head 4/26 >> no acute findings  CT abd/pelvis 4/26 >> cholelithiasis w/o inflammatory chagnes, mild hepatomegaly (25 cm), moderate splenomegaly (16.7 cm), 2 mm non obstructing kidney stone on Lt  4/27 CVL placed. Pressors escalating.   4/28 intubated for TEE, CVC, HD, and A-line placed. CRRT started   Remains on CRRT  MRI on 4/30-multiple infarcts  5/4: repeat TEE shows an aortic valve abscess  5/5: extubated , self-removed HD cath so CRRT holiday> developed severe hyperkalemia overnight.  5/6: new temp HD cath placed   5/8: new temp HD cath placed after prior one self-removed by patient  5/9: Orthopantogram neg for abscess  5/10: HD cath replaced by IR [facility]  5/12 to OR for AVR, aortic root reconstruction, closure of aortic sinus track. To 2H ICU on vent post op. 4 units PRBC given intraop.   5/13 extubated, on CRRT, remains  on pressors  5/18, left AMA.  5/30 continues on intermittent hemodialysis as directed by nephrology team.  Continues with excellent urinary output.  Creatinine between 2.9 and 4.5  Subjective: Ambulating in room.  Does not report any headache, dizziness, palpitations, generalized weakness, dark or bloody stools, indigestion or reflux.  Aware that discharge to [facility] on hold until can clarify hemoglobin continues to drop.  States voided very well after receiving IV Lasix yesterday.  Objective: Vitals:   08/25/20 0714 08/25/20 0813  BP: 123/78 (!) 136/92  Pulse: 92   Resp: 18 19  Temp: 98 F (36.7 C) 98.3 F (36.8 C)  SpO2: 99% 96%    Intake/Output Summary (Last 24 hours) at 08/25/2020 0853 Last data filed at 08/25/2020 [id number] Gross per 24 hour  Intake 896 ml  Output 4845 ml  Net -3949 ml   Filed Weights   08/23/20 0347 08/24/20 0356 08/25/20 0424  Weight: 134.8 kg 135.3 kg (!) 137.2 kg    Exam:  Constitutional:, Pleasant demeanor and in no acute distress Respiratory: Lung sounds are clear to auscultation, he remained stable on room air with O2 sats 96 to 99% Cardiovascular: Normal heart sounds, improving lower extremity peripheral edema R>L with bilateral Ace wraps in place. Abdomen:  LBM 6/1, soft nontender nondistended with normoactive bowel sounds and eating well Genitourinary: Voids easily-like tenderness with palpation Neurologic: CN 2-12 grossly intact. Sensation intact, DTR normal. Strength 5/5 x all 4 extremities.  Psychiatric: Alert and oriented x3 with appropriate affect  Assessment/Plan: Acute problems: Aortic valve endocarditis with perivalvular abscess-Serratia bacteremia, septic emboli causing strokes.   -Presented with sepsis physiology on 4/26 eventually was transferred to ICU, was intubated.   -MRI brain on 07/22/2020 revealed multiple acute infarcts.   -Blood cultures positive for Serratia and Echocardiogram showed severe AI and AR with AV vegetation  confirmed by TEE.   -Now s/p mechanical aortic valve replacement, aortic root construction and closure of aortic sinus tract.   -Needs 6 weeks of cefepime per ID with start date from 08/04/2020. -Continue Coumadin per pharmacy.    -Patient left AMA on 08/07/2020 however came back to hospital after speaking with CT surgery on 08/08/2020.  Recurrent Anemia/thrombocytopenia -patient is s/p 6 units PRBC during last hospital stay.   -Transfuse PRBC as needed for hemoglobin less than 7. -Hemoglobin 6.8 on 6/2 and was given 1 unit PRBCs repeat hemoglobin 7.7.  On 6/3 morning hemoglobin down to 6.5 and night coverage ordered an additional unit of PRBCs.  Posttransfusion hemoglobin up to 8.4.   -INR borderline supratherapeutic at 3.2 -continue Aranesp -We will check stools for blood and if positive will consult GI for further evaluation -We will also check for possible hemolytic etiology-check haptoglobin and LDH -No signs of bleeding and no symptoms but as precaution we will begin H2 blocker -with recovering AKI unable to utilize PPI  Atrial fibrillation/flutter with RVR -Cardiology assistance -Currently rate controlled on Cardizem and metoprolol -Maintaining SR/ST -Continue warfarin for now.  If Hemoccult positive will need to hold -CHA2DS2-VASc equals 3  Acute kidney injury -likely from ATN from infection -Nephrology following  -Int HD on hold-Cr stable with excellent urinary output but increased significantly after given a dose of IV Lasix on 6/2 -UOP averaging about 2 L per 24-hou  Polysubstance abuse -patient has amphetamine abuse, nicotine dependence, opiate dependence on agonist therapy.  He is on chronic Suboxone.  Bilateral lower extremity edema -Markedly improved  -Continue ACE wraps (wrap in Kerlix before applying ACE due to prexisting skin irritation)  Obesity BMI-34.14kg/m    Data Reviewed: Basic Metabolic Panel: Recent Labs  Lab 08/20/20 2327 08/22/20 0203  08/22/20 1028 08/23/20 0155 08/24/20 0128 08/25/20 0203  NA 137  --  136 134* 135 136  K 3.6  --  3.4* 3.9 3.7 3.7  CL 107  --  103 100 101 105  CO2 25  --  '27 24 26 25  '$ GLUCOSE 100*  --  116* 96 110* 85  BUN 33*  --  16 21* 30* 35*  CREATININE 4.44*  --  2.81* 3.25* 3.89* 4.19*  CALCIUM 8.0*  --  8.5* 8.2* 8.2* 8.2*  MG 1.9 1.8  --  1.8 1.9 1.9  PHOS 4.3 2.4*  --  3.1 3.7 4.1   Liver Function Tests: Recent Labs  Lab 08/19/20 0309 08/20/20 0525 08/20/20 2327 08/23/20 0155 08/24/20 0128 08/25/20 0203  AST '17 16 16  '$ --   --   --   ALT '8 7 9  '$ --   --   --   ALKPHOS 62 50 53  --   --   --   BILITOT 0.8 0.7 0.5  --   --   --   PROT 6.7 6.0* 5.9*  --   --   --   ALBUMIN 2.2* 2.0* 2.0* 1.9* 1.9* 1.9*     CBC: Recent Labs  Lab 08/20/20 2327 08/22/20 0203 08/23/20 0155 08/24/20 0128 08/24/20 1016 08/25/20 0203  WBC 7.7 7.8 9.5 8.1  --  7.5  HGB 7.4* 8.0* 7.1* 6.8* 7.7* 6.5*  HCT 24.7* 26.5* 23.4* 22.0* 25.2* 20.9*  MCV 90.8 89.5 89.0 89.1  --  86.7  PLT 226 212 197 198  --  197    BNP (last 3 results) Recent Labs    07/25/20 0334 07/26/20 0331  BNP 153.5* 76.0     Continuous Infusions: . ceFEPime (MAXIPIME) IV Stopped (08/24/20 2050)    Principal Problem:   Endocarditis Active Problems:   Opioid use disorder   Acute renal failure with oliguria (HCC)   Cerebral embolism with cerebral infarction   Anemia   Acute kidney injury (AKI) with acute tubular necrosis (ATN) (HCC)   Atrial fibrillation with RVR (HCC)   Typical atrial flutter ([place])   Consultants:  Infectious disease  PCCM  Neurology  CT surgery  Procedures:  Echocardiogram  TEE  CRRT  Cortrack  Antibiotics: Anti-infectives (From admission, onward)   Start     Dose/Rate Route Frequency Ordered Stop   08/20/20 2200  ceFEPIme (MAXIPIME) 1 g in sodium chloride 0.9 % 100 mL IVPB        1 g 200 mL/hr over 30 Minutes Intravenous Every 24 hours 08/20/20 1523     08/13/20 2200   ceFEPIme (MAXIPIME) 2 g in sodium chloride 0.9 % 100 mL IVPB  Status:  Discontinued        2 g 200 mL/hr over 30 Minutes Intravenous Every 24 hours 08/13/20 1154 08/20/20 1523   08/09/20 2200  ceFEPIme (MAXIPIME) 1 g in sodium chloride 0.9 % 100 mL IVPB  Status:  Discontinued        1 g 200 mL/hr over 30 Minutes Intravenous Every 24 hours 08/09/20 1238 08/13/20 1154   08/09/20 0000  ceFEPIme (MAXIPIME) 1 g in sodium chloride 0.9 % 100 mL IVPB  Status:  Discontinued        1 g 200 mL/hr over 30 Minutes Intravenous Every 24 hours 08/08/20 2305 08/09/20 1238       Time spent: 25 minutes    Erin Hearing ANP  [facility] 7 am - 330 pm/M-F for direct patient care and secure chat Please refer to Amion for contact info 17  days

## 2020-08-25 NOTE — Progress Notes (Signed)
PHARMACY NOTE:  ANTIMICROBIAL RENAL DOSAGE ADJUSTMENT  Current antimicrobial regimen includes a mismatch between antimicrobial dosage and estimated renal function.  As per policy approved by the Pharmacy & Therapeutics and Medical Executive Committees, the antimicrobial dosage will be adjusted accordingly.  Current antimicrobial dosage: Cefepime 1g IV every 24 hours  Indication: Serratia bacteremia + endocarditis and septic emboli  Renal Function: Estimated Creatinine Clearance: 36.5 mL/min (A) (by C-G formula based on SCr of 4.19 mg/dL (H)).   Anuric AKI from ATN. CRRT stopped 5/14 > HD staredt 5/16>last HD 5/30; Scr rising 2.8 > 3.25>3.89>4.19; robust UOP-this was additionally augmented 6/2 with diuretic and neph plans to allow auto diuresis 6/3.  UOP 2350 on 6/2> 4495 ml on 6.3 '[]'$      On intermittent HD, scheduled: iHD, next most likely 5/30 '[]'$      On CRRT    Antimicrobial dosage has been changed to:  Cefepime 2g IV every 12 hours for infection in patient with  CrCl 36 ml/min.   Additional comments: Will continue to monitor renal plans daily and adjust dose accordingly.   Nicole Cella, RPh Clinical Pharmacist [phone] 08/25/2020 4:06 PM  Please check [email or web address] for unit specific pharmacy phone numbers.

## 2020-08-25 NOTE — Progress Notes (Signed)
HOSPITAL MEDICINE OVERNIGHT EVENT NOTE    Notified by nursing that hemoglobin is 6.5 this morning.  This is a similar drop in hemoglobin compared to the day prior.  Patient is not experiencing any chest pain or shortness of breath.  Patient is exhibiting no clinical evidence of gross bleeding.  Ordering 1 unit of packed red blood cells with posttransfusion hemoglobin and hematocrit.  Vernelle Emerald  MD [facility]

## 2020-08-26 DIAGNOSIS — I330 Acute and subacute infective endocarditis: Secondary | ICD-10-CM | POA: Diagnosis not present

## 2020-08-26 LAB — BPAM RBC
Blood Product Expiration Date: 202206042359
Blood Product Expiration Date: 202206302359
ISSUE DATE / TIME: 202206020638
ISSUE DATE / TIME: 202206030437
Unit Type and Rh: 5100
Unit Type and Rh: 5100

## 2020-08-26 LAB — TYPE AND SCREEN
ABO/RH(D): O NEG
Antibody Screen: NEGATIVE
Unit division: 0
Unit division: 0

## 2020-08-26 LAB — RENAL FUNCTION PANEL
Albumin: 2 g/dL — ABNORMAL LOW (ref 3.5–5.0)
Anion gap: 9 (ref 5–15)
BUN: 38 mg/dL — ABNORMAL HIGH (ref 6–20)
CO2: 25 mmol/L (ref 22–32)
Calcium: 8.4 mg/dL — ABNORMAL LOW (ref 8.9–10.3)
Chloride: 102 mmol/L (ref 98–111)
Creatinine, Ser: 4.24 mg/dL — ABNORMAL HIGH (ref 0.61–1.24)
GFR, Estimated: 18 mL/min — ABNORMAL LOW (ref >60)
Glucose, Bld: 108 mg/dL — ABNORMAL HIGH (ref 70–99)
Phosphorus: 4.1 mg/dL (ref 2.5–4.6)
Potassium: 3.6 mmol/L (ref 3.5–5.1)
Sodium: 136 mmol/L (ref 135–145)

## 2020-08-26 LAB — CBC
HCT: 24.5 % — ABNORMAL LOW (ref 39.0–52.0)
Hemoglobin: 7.6 g/dL — ABNORMAL LOW (ref 13.0–17.0)
MCH: 27 pg (ref 26.0–34.0)
MCHC: 31 g/dL (ref 30.0–36.0)
MCV: 86.9 fL (ref 80.0–100.0)
Platelets: 220 10*3/uL (ref 150–400)
RBC: 2.82 MIL/uL — ABNORMAL LOW (ref 4.22–5.81)
RDW: 15.4 % (ref 11.5–15.5)
WBC: 6.6 10*3/uL (ref 4.0–10.5)
nRBC: 0 % (ref 0.0–0.2)

## 2020-08-26 LAB — HAPTOGLOBIN: Haptoglobin: 111 mg/dL (ref 17–317)

## 2020-08-26 LAB — PROTIME-INR
INR: 2.7 — ABNORMAL HIGH (ref 0.8–1.2)
Prothrombin Time: 28.3 seconds — ABNORMAL HIGH (ref 11.4–15.2)

## 2020-08-26 LAB — MAGNESIUM: Magnesium: 2 mg/dL (ref 1.7–2.4)

## 2020-08-26 MED ORDER — WARFARIN SODIUM 7.5 MG PO TABS
7.5000 mg | ORAL_TABLET | Freq: Once | ORAL | Status: AC
  Administered 2020-08-26: 7.5 mg via ORAL
  Filled 2020-08-26: qty 1

## 2020-08-26 NOTE — Progress Notes (Signed)
Patient ID: Alex [name], male   DOB: 08-Jan-1981, 40 y.o.   MRN: [id number] [facility] Progress Note   Assessment/ Plan:   1. Acute kidney Injury: With dense ATN related to SBE/postoperative course.  Earlier on CRRT and transitioned to intermittent hemodialysis starting 5/16.  His last hemodialysis was on Monday 5/30 and he continues to have excellent urine output.  Overnight with a trivial rise of his creatinine that likely indicates approach to the plateau phase versus value of newly established baseline.  He does not have any acute indication for dialysis at this time and if creatinine remains stable and clinically without any indications for dialysis, will order for discontinuation of tunneled hemodialysis catheter. 2.  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 3.  Anemia: Likely secondary to acute/critical illness and perioperative losses.  On Aranesp/iron supplementation with PRBC transfusion overnight for acute hemoglobin/hematocrit drop.  No evidence of overt or occult blood loss including hemolysis based on previous work-up done. 4.  Hypertension: Blood pressure under decent control, continue to monitor with UF/HD. 5.  Atrial flutter with RVR: Seen by cardiology and has been transitioned from intravenous diltiazem to oral.  Subjective:   Denies any abdominal pain, chest pain or shortness of breath.  Leg swelling improving.   Objective:   BP 130/85 (BP Location: Left Arm)   Pulse 84   Temp 98.8 F (37.1 C) (Oral)   Resp 18   Ht '6\' 4"'$  (1.93 m)   Wt (!) 137.2 kg   SpO2 92%   BMI 36.82 kg/m   Intake/Output Summary (Last 24 hours) at 08/26/2020 0933 Last data filed at 08/26/2020 0353 [facility] per 24 hour  Intake 166.78 ml  Output 1800 ml  Net -1633.22 ml   Weight change: 0.032 kg  Physical Exam: Gen: Appears comfortable resting flat in bed CVS: Pulse irregular  tachycardia, mechanical click audible over the precordium. Resp: Clear to auscultation bilaterally, no rales/rhonchi.  Right IJ TDC in place Abd: Soft, obese, nontender, bowel sounds normal Ext: 2+ bilateral lower extremity edema with Unna boots.  Imaging: No results found.  Labs: BMET Recent Labs  Lab 08/20/20 0525 08/20/20 2327 08/22/20 0203 08/22/20 1028 08/23/20 0155 08/24/20 0128 08/25/20 0203 08/26/20 0123  NA 135 137  --  136 134* 135 136 136  K 3.5 3.6  --  3.4* 3.9 3.7 3.7 3.6  CL 106 107  --  103 100 101 105 102  CO2 22 25  --  '27 24 26 25 25  '$ GLUCOSE 107* 100*  --  116* 96 110* 85 108*  BUN 29* 33*  --  16 21* 30* 35* 38*  CREATININE 3.95* 4.44*  --  2.81* 3.25* 3.89* 4.19* 4.24*  CALCIUM 7.9* 8.0*  --  8.5* 8.2* 8.2* 8.2* 8.4*  PHOS 4.2 4.3 2.4*  --  3.1 3.7 4.1 4.1   CBC Recent Labs  Lab 08/23/20 0155 08/24/20 0128 08/24/20 1016 08/25/20 0203 08/25/20 0918 08/26/20 0123  WBC 9.5 8.1  --  7.5  --  6.6  HGB 7.1* 6.8* 7.7* 6.5* 8.4* 7.6*  HCT 23.4* 22.0* 25.2* 20.9* 26.6* 24.5*  MCV 89.0 89.1  --  86.7  --  86.9  PLT 197 198  --  197  --  220    Medications:    . (feeding supplement) PROSource Plus  30 mL Oral BID BM  . buprenorphine-naloxone  1 tablet Sublingual  BID  . Chlorhexidine Gluconate Cloth  6 each Topical Daily  . collagenase   Topical Daily  . darbepoetin (ARANESP) injection - NON-DIALYSIS  150 mcg Subcutaneous Q Sat-1800  . diltiazem  180 mg Oral Daily  . famotidine  20 mg Oral BID  . iron polysaccharides  150 mg Oral Daily  . lidocaine  1 patch Transdermal Q24H  . melatonin  3 mg Oral QHS  . methocarbamol  500 mg Oral TID  . nicotine  14 mg Transdermal Daily  . polyethylene glycol  17 g Oral Daily  . senna-docusate  1 tablet Oral BID  . warfarin  5 mg Oral ONCE-1600  . Warfarin - Pharmacist Dosing Inpatient   Does not apply q1600   Elmarie Shiley, MD 08/26/2020, 9:33 AM

## 2020-08-26 NOTE — Progress Notes (Signed)
[facility] PROGRESS NOTE  Alex [name] [id number] DOB: 03/10/1981 DOA: 08/08/2020 [facility]CP: [facility]  Status: Remains inpatient appropriate because:Unsafe d/c plan, IV treatments appropriate due to intensity of illness or inability to take PO and Inpatient level of care appropriate due to severity of illness   Dispo: The patient is from: Home              Anticipated d/c is to: Home after LTAC              Patient currently is not medically stable to d/c.   Difficult to place patient Yes   Level of care: Med-Surg  Code Status: Full Family Communication:  DVT prophylaxis: Heparin infusion COVID vaccination status: Received first dose of Pfizer on 08/16/2020  HPI: 40 year old male with history of polysubstance abuse, aortic valve endocarditis, Serratia bacteremia, septic emboli causing stroke and acute kidney injury requiring hemodialysis.  Patient left hospital AMA on 08/08/2020.  He came back same night after discussion with CT surgery.  Significant events:  [facility]  [facility] head 4/26 >> no acute findings  CT abd/pelvis 4/26 >> cholelithiasis w/o inflammatory chagnes, mild hepatomegaly (25 cm), moderate splenomegaly (16.7 cm), 2 mm non obstructing kidney stone on Lt  4/27 CVL placed. Pressors escalating.   4/28 intubated for TEE, CVC, HD, and A-line placed. CRRT started   Remains on CRRT  MRI on 4/30-multiple infarcts  5/4: repeat TEE shows an aortic valve abscess  5/5: extubated , self-removed HD cath so CRRT holiday> developed severe hyperkalemia overnight.  5/6: new temp HD cath placed   5/8: new temp HD cath placed after prior one self-removed by patient  5/9: Orthopantogram neg for abscess  5/10: HD cath replaced by IR [facility]  5/12 to OR for AVR, aortic root reconstruction, closure of aortic sinus track. To 2H ICU on vent post op. 4 units PRBC given intraop.   5/13 extubated, on CRRT, remains  on pressors  5/18, left AMA.  5/30 continues on intermittent hemodialysis as directed by nephrology team.  Continues with excellent urinary output.  Creatinine between 2.9 and 4.5  Subjective: No acute issues or events overnight, no dark black tarry stool no red stool no other signs or symptoms of bleeding at this time, denies palpitation shortness of breath chest pain nausea vomiting diarrhea constipation headache fevers or chills.  Generally feels quite well, somewhat concerned about increased frequency of antibiotics which we discussed was in response to his improving kidney function and not a sign of worsening infection.  Objective: Vitals:   08/25/20 2352 08/26/20 0517  BP: 124/77 130/85  Pulse: 78 84  Resp: 18 18  Temp: 98.4 F (36.9 C) 98.8 F (37.1 C)  SpO2: 96% 92%    Intake/Output Summary (Last 24 hours) at 08/26/2020 [id number] Last data filed at 08/26/2020 0353 [facility] per 24 hour  Intake 646.78 ml  Output 2450 ml  Net -1803.22 ml   Filed Weights   08/24/20 0356 08/25/20 0424 08/26/20 0517  Weight: 135.3 kg (!) 137.2 kg (!) 137.2 kg    Exam:  Constitutional:, Pleasant demeanor and in no acute distress Respiratory: Lung sounds are clear to auscultation, he remained stable on room air with O2 sats 96 to 99% Cardiovascular: Normal heart sounds, improving lower extremity peripheral edema R>L with bilateral Ace wraps in place. Abdomen:  LBM 6/1, soft nontender nondistended with normoactive bowel sounds and eating well Genitourinary: Voids easily-like tenderness with palpation Neurologic:  CN 2-12 grossly intact. Sensation intact, DTR normal. Strength 5/5 x all 4 extremities.  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   -Presented with sepsis physiology on 4/26 eventually was transferred to ICU, was intubated.   -MRI brain on 07/22/2020  revealed multiple acute infarcts.   -Blood cultures positive for Serratia and Echocardiogram showed severe AI and AR with AV vegetation confirmed by TEE.   -Now s/p mechanical aortic valve replacement, aortic root construction and closure of aortic sinus tract.   -Needs 6 weeks of cefepime per ID with start date from 08/04/2020. -Continue Coumadin per pharmacy.    -Patient left AMA on 08/07/2020 however came back to hospital after speaking with CT surgery on 08/08/2020.  Recurrent Anemia/thrombocytopenia -at this point likely chronic anemia of chronic disease Not consistent with hemolysis (LDH min elevated but haptoWNL) FOBT negative -patient is s/p 6 units PRBC during last hospital stay.   -Transfuse PRBC as needed for hemoglobin less than 7. -Hemoglobin 6.8 on 6/2 and was given 1 unit PRBCs repeat hemoglobin 7.7.  On 6/3 morning hemoglobin down to 6.5 and night coverage ordered an additional unit of PRBCs.  Posttransfusion hemoglobin up to 8.4 - downtrending the following morning to 7.6 -INR borderline supratherapeutic at 3.2 -continue Aranesp -FOBT negative - no need for GI workup -LDH minimally elevated (improved from last check) Haptoglobin WNL -No signs of bleeding and no symptoms but as precaution we will begin H2 blocker -with recovering AKI unable to utilize PPI  Atrial fibrillation/flutter with RVR -Cardiology assistance -Currently rate controlled on Cardizem and metoprolol -Maintaining SR/ST -Continue warfarin for now(held 6/3 while workup for possible occult GI bleed) -CHA2DS2-VASc equals 3  Acute kidney injury, resolving -likely from ATN from infection -Nephrology following, appreciate insight and recommendations  -Hemodialysis on hold since 5/31, given stabilizing creatinine and increasing urine output patient is likely to be improving -we will discuss removal of hemodialysis catheter if he continues to improve over the next week  Polysubstance abuse -patient has amphetamine  abuse, nicotine dependence, opiate dependence on agonist therapy.  He is on chronic Suboxone.  Bilateral lower extremity edema -Markedly improved  with increased urine output -Continue ACE wraps (wrap in Kerlix before applying ACE due to prexisting skin irritation)  Obesity BMI-34.14kg/m  Data Reviewed: Basic Metabolic Panel: Recent Labs  Lab 08/22/20 0203 08/22/20 1028 08/23/20 0155 08/24/20 0128 08/25/20 0203 08/26/20 0123  NA  --  136 134* 135 136 136  K  --  3.4* 3.9 3.7 3.7 3.6  CL  --  103 100 101 105 102  CO2  --  '27 24 26 25 25  '$ GLUCOSE  --  116* 96 110* 85 108*  BUN  --  16 21* 30* 35* 38*  CREATININE  --  2.81* 3.25* 3.89* 4.19* 4.24*  CALCIUM  --  8.5* 8.2* 8.2* 8.2* 8.4*  MG 1.8  --  1.8 1.9 1.9 2.0  PHOS 2.4*  --  3.1 3.7 4.1 4.1   Liver Function Tests: Recent Labs  Lab 08/20/20 0525 08/20/20 2327 08/23/20 0155 08/24/20 0128 08/25/20 0203 08/26/20 0123  AST 16 16  --   --   --   --   ALT 7 9  --   --   --   --   ALKPHOS 50 53  --   --   --   --   BILITOT 0.7 0.5  --   --   --   --  PROT 6.0* 5.9*  --   --   --   --   ALBUMIN 2.0* 2.0* 1.9* 1.9* 1.9* 2.0*     CBC: Recent Labs  Lab 08/22/20 0203 08/23/20 0155 08/24/20 0128 08/24/20 1016 08/25/20 0203 08/25/20 0918 08/26/20 0123  WBC 7.8 9.5 8.1  --  7.5  --  6.6  HGB 8.0* 7.1* 6.8* 7.7* 6.5* 8.4* 7.6*  HCT 26.5* 23.4* 22.0* 25.2* 20.9* 26.6* 24.5*  MCV 89.5 89.0 89.1  --  86.7  --  86.9  PLT 212 197 198  --  197  --  220    BNP (last 3 results) Recent Labs    07/25/20 0334 07/26/20 0331  BNP 153.5* 76.0     Continuous Infusions: . ceFEPime (MAXIPIME) IV 2 g (08/26/20 [id number])    Principal Problem:   Endocarditis Active Problems:   Opioid use disorder   Acute renal failure with oliguria (HCC)   Cerebral embolism with cerebral infarction   Anemia   Acute kidney injury (AKI) with acute tubular necrosis (ATN) (HCC)   Atrial fibrillation with RVR (HCC)   Typical atrial  flutter ([place])   Consultants:  Infectious disease  PCCM  Neurology  CT surgery  Procedures:  Echocardiogram  TEE  CRRT  Cortrack  Antibiotics: Anti-infectives (From admission, onward)   Start     Dose/Rate Route Frequency Ordered Stop   08/25/20 1800  ceFEPIme (MAXIPIME) 2 g in sodium chloride 0.9 % 100 mL IVPB        2 g 200 mL/hr over 30 Minutes Intravenous Every 12 hours 08/25/20 1618     08/20/20 2200  ceFEPIme (MAXIPIME) 1 g in sodium chloride 0.9 % 100 mL IVPB  Status:  Discontinued        1 g 200 mL/hr over 30 Minutes Intravenous Every 24 hours 08/20/20 1523 08/25/20 1618   08/13/20 2200  ceFEPIme (MAXIPIME) 2 g in sodium chloride 0.9 % 100 mL IVPB  Status:  Discontinued        2 g 200 mL/hr over 30 Minutes Intravenous Every 24 hours 08/13/20 1154 08/20/20 1523   08/09/20 2200  ceFEPIme (MAXIPIME) 1 g in sodium chloride 0.9 % 100 mL IVPB  Status:  Discontinued        1 g 200 mL/hr over 30 Minutes Intravenous Every 24 hours 08/09/20 1238 08/13/20 1154   08/09/20 0000  ceFEPIme (MAXIPIME) 1 g in sodium chloride 0.9 % 100 mL IVPB  Status:  Discontinued        1 g 200 mL/hr over 30 Minutes Intravenous Every 24 hours 08/08/20 2305 08/09/20 1238       Time spent: 25 minutes    [facility]  Pager: Secure chat  If after 7 PM find on-call provider on amion 18  days

## 2020-08-26 NOTE — Progress Notes (Signed)
ANTICOAGULATION CONSULT NOTE - Follow Up Consult  Pharmacy Consult for Warfarin Indication: mechanical aortic valve  Labs: Recent Labs    08/24/20 0128 08/24/20 1016 08/25/20 0203 08/25/20 0918 08/26/20 0123  HGB 6.8*   < > 6.5* 8.4* 7.6*  HCT 22.0*   < > 20.9* 26.6* 24.5*  PLT 198  --  197  --  220  LABPROT 31.5*  --  32.8*  --  28.3*  INR 3.1*  --  3.2*  --  2.7*  CREATININE 3.89*  --  4.19*  --  4.24*   < > = values in this interval not displayed.   Assessment: 39yom with recent endocarditis> AV root repair and Mechanical AVR on 08/03/20. Warfarin started 5/13. Left AMA 5/16 pm then returned 5/17 pm. INR 2.6 on 5/18 and Warfarin resumed. Pharmacy was consulted to bridge warfarin with heparin until INR >2.5 again. Heparin stopped 6/1.  Today's INR remains therapeutic at 2.7, however down significantly from 3.2 yesterday.  Warfarin was held yesterday d/t w/u for GIB- hemoccult was negative, Will give increased dose today since pt's INR is very sensitive to change and has been difficult to keep in goal range.   Goal of Therapy:  INR 2.5-3.5 per Dr. Kipp Brood Monitor platelets by anticoagulation protocol: Yes   Plan:  Give warfarin 7.5 mg x1 tonight Monitor daily PT/INR Watch Hgb and s/sx bleeding  Mercy Riding, PharmD PGY1 Acute Care Pharmacy Resident Please refer to [facility] for unit-specific pharmacist

## 2020-08-27 DIAGNOSIS — I330 Acute and subacute infective endocarditis: Secondary | ICD-10-CM | POA: Diagnosis not present

## 2020-08-27 LAB — CBC
HCT: 24.8 % — ABNORMAL LOW (ref 39.0–52.0)
Hemoglobin: 7.7 g/dL — ABNORMAL LOW (ref 13.0–17.0)
MCH: 27.3 pg (ref 26.0–34.0)
MCHC: 31 g/dL (ref 30.0–36.0)
MCV: 87.9 fL (ref 80.0–100.0)
Platelets: 229 10*3/uL (ref 150–400)
RBC: 2.82 MIL/uL — ABNORMAL LOW (ref 4.22–5.81)
RDW: 15.1 % (ref 11.5–15.5)
WBC: 6.9 10*3/uL (ref 4.0–10.5)
nRBC: 0 % (ref 0.0–0.2)

## 2020-08-27 LAB — PROTIME-INR
INR: 2.3 — ABNORMAL HIGH (ref 0.8–1.2)
Prothrombin Time: 25.1 seconds — ABNORMAL HIGH (ref 11.4–15.2)

## 2020-08-27 LAB — RENAL FUNCTION PANEL
Albumin: 1.8 g/dL — ABNORMAL LOW (ref 3.5–5.0)
Anion gap: 6 (ref 5–15)
BUN: 40 mg/dL — ABNORMAL HIGH (ref 6–20)
CO2: 24 mmol/L (ref 22–32)
Calcium: 8.4 mg/dL — ABNORMAL LOW (ref 8.9–10.3)
Chloride: 108 mmol/L (ref 98–111)
Creatinine, Ser: 4.25 mg/dL — ABNORMAL HIGH (ref 0.61–1.24)
GFR, Estimated: 18 mL/min — ABNORMAL LOW (ref >60)
Glucose, Bld: 91 mg/dL (ref 70–99)
Phosphorus: 4.3 mg/dL (ref 2.5–4.6)
Potassium: 3.9 mmol/L (ref 3.5–5.1)
Sodium: 138 mmol/L (ref 135–145)

## 2020-08-27 MED ORDER — WARFARIN SODIUM 7.5 MG PO TABS
7.5000 mg | ORAL_TABLET | Freq: Once | ORAL | Status: AC
  Administered 2020-08-27: 7.5 mg via ORAL
  Filled 2020-08-27: qty 1

## 2020-08-27 NOTE — Progress Notes (Signed)
ANTICOAGULATION CONSULT NOTE - Follow Up Consult  Pharmacy Consult for Warfarin Indication: mechanical aortic valve  Labs: Recent Labs    08/25/20 0203 08/25/20 0918 08/26/20 0123 08/27/20 0119  HGB 6.5* 8.4* 7.6* 7.7*  HCT 20.9* 26.6* 24.5* 24.8*  PLT 197  --  220 229  LABPROT 32.8*  --  28.3* 25.1*  INR 3.2*  --  2.7* 2.3*  CREATININE 4.19*  --  4.24* 4.25*   Assessment: 39yom with recent endocarditis> AV root repair and Mechanical AVR on 08/03/20. Warfarin started 5/13. Left AMA 5/16 pm then returned 5/17 pm. INR 2.6 on 5/18 and Warfarin resumed. Pharmacy was consulted to bridge warfarin with heparin until INR >2.5 again. Heparin stopped 6/1.  Today's INR is subtherapeutic, down from 2.7 to 2.3 after holding warfarin on 6/3. Hgb remains low at 7.7, plt wnl.  Goal of Therapy:  INR 2.5-3.5 per Dr. Kipp Brood Monitor platelets by anticoagulation protocol: Yes   Plan:  Repeat warfarin 7.5 mg x1 tonight Monitor daily PT/INR Watch CBC and s/sx bleeding  Mercy Riding, PharmD PGY1 Acute Care Pharmacy Resident Please refer to [facility] for unit-specific pharmacist

## 2020-08-27 NOTE — Progress Notes (Signed)
[facility] PROGRESS NOTE  Alex [name] [id number] DOB: [birth year] DOA: 08/08/2020 PCP: [facility]  Status: Remains inpatient appropriate because:Unsafe d/c plan, IV treatments appropriate due to intensity of illness or inability to take PO and Inpatient level of care appropriate due to severity of illness   Dispo: The patient is from: Home              Anticipated d/c is to: Home after LTAC              Patient currently is not medically stable to d/c.   Difficult to place patient Yes   Level of care: Med-Surg  Code Status: Full Family Communication:  DVT prophylaxis: Heparin infusion COVID vaccination status: Received first dose of Pfizer on 08/16/2020  HPI: 40 year old male with history of polysubstance abuse, aortic valve endocarditis, Serratia bacteremia, septic emboli causing stroke and acute kidney injury requiring hemodialysis.  Patient left hospital AMA on 08/08/2020.  He came back same night after discussion with CT surgery.  Significant events:  4/26 transfer from [facility]  CT head 4/26 >> no acute findings  CT abd/pelvis 4/26 >> cholelithiasis w/o inflammatory chagnes, mild hepatomegaly (25 cm), moderate splenomegaly (16.7 cm), 2 mm non obstructing kidney stone on Lt  4/27 CVL placed. Pressors escalating.   4/28 intubated for TEE, CVC, HD, and A-line placed. CRRT started   Remains on CRRT  MRI on 4/30-multiple infarcts  5/4: repeat TEE shows an aortic valve abscess  5/5: extubated , self-removed HD cath so CRRT holiday> developed severe hyperkalemia overnight.  5/6: new temp HD cath placed   5/8: new temp HD cath placed after prior one self-removed by patient  5/9: Orthopantogram neg for abscess  5/10: HD cath replaced by IR [facility]  5/12 to OR for AVR, aortic root reconstruction, closure of aortic sinus track. To 2H ICU on vent post op. 4 units PRBC given intraop.   5/13 extubated, on CRRT, remains  on pressors  5/18, left AMA.  5/30 continues on intermittent hemodialysis as directed by nephrology team.  Continues with excellent urinary output.  Creatinine between 2.9 and 4.5  Subjective: No acute issues or events overnight, hemoglobin uptrending, creatinine essentially flat since yesterday, patient excited about improvement in symptoms and labs.  Otherwise denies any chest pain shortness of breath nausea vomiting diarrhea constipation headache fevers chills black or tarry stools or other signs or symptoms of bleeding or easy bruising.  Objective: Vitals:   08/27/20 0825 08/27/20 1305  BP: (!) 149/96 130/88  Pulse: (!) 138 (!) 101  Resp: 17 18  Temp: 98.4 F (36.9 C) 98.6 F (37 C)  SpO2: 96% 95%    Intake/Output Summary (Last 24 hours) at 08/27/2020 1504 Last data filed at 08/27/2020 1311 Gross per 24 hour  Intake 714 ml  Output 3520 ml  Net -2806 ml   Filed Weights   08/25/20 0424 08/26/20 0517 08/27/20 0436  Weight: (!) 137.2 kg (!) 137.2 kg (!) 136.6 kg    Exam:  Constitutional:, Pleasant demeanor and in no acute distress Respiratory: Lung sounds are clear to auscultation, he remained stable on room air with O2 sats 96 to 99% Cardiovascular: Normal heart sounds, improving lower extremity peripheral edema R>L with bilateral Ace wraps in place. Abdomen:  LBM 6/1, soft nontender nondistended with normoactive bowel sounds and eating well Genitourinary: Voids easily-like tenderness with palpation Neurologic: CN 2-12 grossly intact. Sensation intact, DTR normal. Strength 5/5 x all 4 extremities.  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   -Presented with sepsis physiology on 4/26 eventually was transferred to ICU, was intubated.   -MRI brain on 07/22/2020 revealed multiple acute infarcts.   -Blood cultures positive for Serratia  and Echocardiogram showed severe AI and AR with AV vegetation confirmed by TEE.   -Now s/p mechanical aortic valve replacement, aortic root construction and closure of aortic sinus tract.   -Needs 6 weeks of cefepime per ID with start date from 08/04/2020 (through 09/14/2020).  -Continue Coumadin per pharmacy.    -Patient left AMA on 08/07/2020 however came back to hospital after speaking with CT surgery on 08/08/2020.  Recurrent Anemia/thrombocytopenia -at this point likely chronic anemia of chronic disease Not consistent with hemolysis (LDH min elevated but haptoWNL) FOBT negative -patient is s/p 6 units PRBC during last hospital stay.   -Transfuse PRBC as needed for hemoglobin less than 7. -Hemoglobin 6.8 on 6/2 and was given 1 unit PRBCs repeat hemoglobin 7.7.  On 6/3 morning hemoglobin down to 6.5 and night coverage ordered an additional unit of PRBCs.  Posttransfusion hemoglobin up to 8.4 - downtrending the following morning to 7.6 -INR borderline supratherapeutic at 3.2 -continue Aranesp -FOBT negative - no need for GI workup -LDH minimally elevated (improved from last check) Haptoglobin WNL -No signs of bleeding and no symptoms but as precaution we will begin H2 blocker -with recovering AKI unable to utilize PPI  Atrial fibrillation/flutter with RVR -Cardiology assistance -Currently rate controlled on Cardizem and metoprolol -Maintaining SR/ST -Continue warfarin for now(held 6/3 while workup for possible occult GI bleed) -CHA2DS2-VASc equals 3  Acute kidney injury, resolving -likely from ATN from infection -Nephrology following, appreciate insight and recommendations  -Hemodialysis on hold since 5/31, given stabilizing creatinine and increasing urine output patient is likely to be improving -we will discuss removal of hemodialysis catheter if he continues to improve over the next week  Polysubstance abuse -patient has amphetamine abuse, nicotine dependence, opiate dependence on  agonist therapy.  He is on chronic Suboxone.  Bilateral lower extremity edema -Markedly improved  with increased urine output -Continue ACE wraps (wrap in Kerlix before applying ACE due to prexisting skin irritation)  Obesity BMI-34.14kg/m  Data Reviewed: Basic Metabolic Panel: Recent Labs  Lab 08/22/20 0203 08/22/20 1028 08/23/20 0155 08/24/20 0128 08/25/20 0203 08/26/20 0123 08/27/20 0119  NA  --    < > 134* 135 136 136 138  K  --    < > 3.9 3.7 3.7 3.6 3.9  CL  --    < > 100 101 105 102 108  CO2  --    < > '24 26 25 25 24  '$ GLUCOSE  --    < > 96 110* 85 108* 91  BUN  --    < > 21* 30* 35* 38* 40*  CREATININE  --    < > 3.25* 3.89* 4.19* 4.24* 4.25*  CALCIUM  --    < > 8.2* 8.2* 8.2* 8.4* 8.4*  MG 1.8  --  1.8 1.9 1.9 2.0  --   PHOS 2.4*  --  3.1 3.7 4.1 4.1 4.3   < > = values in this interval not displayed.   Liver Function Tests: Recent Labs  Lab 08/20/20 2327 08/23/20 0155 08/24/20 0128 08/25/20 0203 08/26/20 0123 08/27/20 0119  AST 16  --   --   --   --   --  ALT 9  --   --   --   --   --   ALKPHOS 53  --   --   --   --   --   BILITOT 0.5  --   --   --   --   --   PROT 5.9*  --   --   --   --   --   ALBUMIN 2.0* 1.9* 1.9* 1.9* 2.0* 1.8*     CBC: Recent Labs  Lab 08/23/20 0155 08/24/20 0128 08/24/20 1016 08/25/20 0203 08/25/20 0918 08/26/20 0123 08/27/20 0119  WBC 9.5 8.1  --  7.5  --  6.6 6.9  HGB 7.1* 6.8* 7.7* 6.5* 8.4* 7.6* 7.7*  HCT 23.4* 22.0* 25.2* 20.9* 26.6* 24.5* 24.8*  MCV 89.0 89.1  --  86.7  --  86.9 87.9  PLT 197 198  --  197  --  220 229    BNP (last 3 results) Recent Labs    07/25/20 0334 07/26/20 0331  BNP 153.5* 76.0     Continuous Infusions: . ceFEPime (MAXIPIME) IV 2 g (08/27/20 [id number])    Principal Problem:   Endocarditis Active Problems:   Opioid use disorder   Acute renal failure with oliguria (HCC)   Cerebral embolism with cerebral infarction   Anemia   Acute kidney injury (AKI) with acute tubular  necrosis (ATN) (HCC)   Atrial fibrillation with RVR (HCC)   Typical atrial flutter ([place])   Consultants:  Infectious disease  PCCM  Neurology  CT surgery  Procedures:  Echocardiogram  TEE  CRRT  Cortrack  Antibiotics: Anti-infectives (From admission, onward)   Start     Dose/Rate Route Frequency Ordered Stop   08/25/20 1800  ceFEPIme (MAXIPIME) 2 g in sodium chloride 0.9 % 100 mL IVPB        2 g 200 mL/hr over 30 Minutes Intravenous Every 12 hours 08/25/20 1618     08/20/20 2200  ceFEPIme (MAXIPIME) 1 g in sodium chloride 0.9 % 100 mL IVPB  Status:  Discontinued        1 g 200 mL/hr over 30 Minutes Intravenous Every 24 hours 08/20/20 1523 08/25/20 1618   08/13/20 2200  ceFEPIme (MAXIPIME) 2 g in sodium chloride 0.9 % 100 mL IVPB  Status:  Discontinued        2 g 200 mL/hr over 30 Minutes Intravenous Every 24 hours 08/13/20 1154 08/20/20 1523   08/09/20 2200  ceFEPIme (MAXIPIME) 1 g in sodium chloride 0.9 % 100 mL IVPB  Status:  Discontinued        1 g 200 mL/hr over 30 Minutes Intravenous Every 24 hours 08/09/20 1238 08/13/20 1154   08/09/20 0000  ceFEPIme (MAXIPIME) 1 g in sodium chloride 0.9 % 100 mL IVPB  Status:  Discontinued        1 g 200 mL/hr over 30 Minutes Intravenous Every 24 hours 08/08/20 2305 08/09/20 1238       Time spent: 25 minutes    [facility]  Pager: Secure chat  If after 7 PM find on-call provider on amion 19  days

## 2020-08-27 NOTE — Progress Notes (Signed)
Patient ID: Alex [name], male   DOB: [birth year], 40 y.o.   MRN: [id number] [facility] Progress Note   Assessment/ Plan:   1. Acute kidney Injury: With dense ATN related to SBE/postoperative course.  Earlier on CRRT and transitioned to intermittent hemodialysis starting 5/16.  His last hemodialysis was on Monday 5/30 and he continues to have excellent urine output.  Creatinine stable overnight and without any acute electrolyte abnormalities or indications for dialysis.  If creatinine remains stable/better over the next 24 to 48 hours, would discontinue dialysis catheter and continue to follow labs for renal recovery. 2.  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 3.  Anemia: Likely secondary to acute/critical illness and perioperative losses.  On Aranesp/iron with intermittent PRBC transfusion-stable hemoglobin and hematocrit overnight on labs.  No evidence of overt or occult blood loss including hemolysis based on previous work-up done. 4.  Hypertension: Blood pressure under decent control, continue to monitor with UF/HD. 5.  Atrial flutter with RVR: Seen by cardiology and has been transitioned from intravenous diltiazem to oral.  Subjective:   Denies any acute events overnight, continues to feel better with excellent urine output and improving leg swelling.   Objective:   BP (!) 151/96 (BP Location: Right Arm)   Pulse 88   Temp 99.2 F (37.3 C) (Oral)   Resp 16   Ht '6\' 4"'$  (1.93 m)   Wt (!) 136.6 kg   SpO2 98%   BMI 36.65 kg/m   Intake/Output Summary (Last 24 hours) at 08/27/2020 0806 Last data filed at 08/27/2020 [id number] Gross per 24 hour  Intake 120 ml  Output 3645 ml  Net -3525 ml   Weight change: -0.622 kg  Physical Exam: Gen: Appears comfortable resting flat in bed CVS: Pulse irregular tachycardia, mechanical click audible over the precordium. Resp: Clear to  auscultation bilaterally, no rales/rhonchi.  Right IJ TDC in place Abd: Soft, obese, nontender, bowel sounds normal Ext: 2+ bilateral lower extremity edema with Unna boots.  Imaging: No results found.  Labs: BMET Recent Labs  Lab 08/20/20 2327 08/22/20 0203 08/22/20 1028 08/23/20 0155 08/24/20 0128 08/25/20 0203 08/26/20 0123 08/27/20 0119  NA 137  --  136 134* 135 136 136 138  K 3.6  --  3.4* 3.9 3.7 3.7 3.6 3.9  CL 107  --  103 100 101 105 102 108  CO2 25  --  '27 24 26 25 25 24  '$ GLUCOSE 100*  --  116* 96 110* 85 108* 91  BUN 33*  --  16 21* 30* 35* 38* 40*  CREATININE 4.44*  --  2.81* 3.25* 3.89* 4.19* 4.24* 4.25*  CALCIUM 8.0*  --  8.5* 8.2* 8.2* 8.2* 8.4* 8.4*  PHOS 4.3 2.4*  --  3.1 3.7 4.1 4.1 4.3   CBC Recent Labs  Lab 08/24/20 0128 08/24/20 1016 08/25/20 0203 08/25/20 0918 08/26/20 0123 08/27/20 0119  WBC 8.1  --  7.5  --  6.6 6.9  HGB 6.8*   < > 6.5* 8.4* 7.6* 7.7*  HCT 22.0*   < > 20.9* 26.6* 24.5* 24.8*  MCV 89.1  --  86.7  --  86.9 87.9  PLT 198  --  197  --  220 229   < > = values in this interval not displayed.    Medications:    . (feeding supplement) PROSource Plus  30 mL Oral BID BM  . buprenorphine-naloxone  1 tablet Sublingual BID  . Chlorhexidine Gluconate Cloth  6 each Topical Daily  . collagenase   Topical Daily  . darbepoetin (ARANESP) injection - NON-DIALYSIS  150 mcg Subcutaneous Q Sat-1800  . diltiazem  180 mg Oral Daily  . famotidine  20 mg Oral BID  . iron polysaccharides  150 mg Oral Daily  . lidocaine  1 patch Transdermal Q24H  . melatonin  3 mg Oral QHS  . methocarbamol  500 mg Oral TID  . nicotine  14 mg Transdermal Daily  . polyethylene glycol  17 g Oral Daily  . senna-docusate  1 tablet Oral BID  . warfarin  7.5 mg Oral ONCE-1600  . Warfarin - Pharmacist Dosing Inpatient   Does not apply q1600   Elmarie Shiley, MD 08/27/2020, 8:06 AM

## 2020-08-28 ENCOUNTER — Inpatient Hospital Stay (HOSPITAL_COMMUNITY): Payer: 59

## 2020-08-28 LAB — FERRITIN: Ferritin: 337 ng/mL — ABNORMAL HIGH (ref 24–336)

## 2020-08-28 LAB — CBC
HCT: 22.1 % — ABNORMAL LOW (ref 39.0–52.0)
Hemoglobin: 6.7 g/dL — CL (ref 13.0–17.0)
MCH: 26.8 pg (ref 26.0–34.0)
MCHC: 30.3 g/dL (ref 30.0–36.0)
MCV: 88.4 fL (ref 80.0–100.0)
Platelets: 217 10*3/uL (ref 150–400)
RBC: 2.5 MIL/uL — ABNORMAL LOW (ref 4.22–5.81)
RDW: 14.9 % (ref 11.5–15.5)
WBC: 5.6 10*3/uL (ref 4.0–10.5)
nRBC: 0 % (ref 0.0–0.2)

## 2020-08-28 LAB — RETICULOCYTES
Immature Retic Fract: 21 % — ABNORMAL HIGH (ref 2.3–15.9)
RBC.: 3.11 MIL/uL — ABNORMAL LOW (ref 4.22–5.81)
Retic Count, Absolute: 35.8 10*3/uL (ref 19.0–186.0)
Retic Ct Pct: 1.2 % (ref 0.4–3.1)

## 2020-08-28 LAB — RENAL FUNCTION PANEL
Albumin: 1.9 g/dL — ABNORMAL LOW (ref 3.5–5.0)
Anion gap: 7 (ref 5–15)
BUN: 41 mg/dL — ABNORMAL HIGH (ref 6–20)
CO2: 25 mmol/L (ref 22–32)
Calcium: 8.4 mg/dL — ABNORMAL LOW (ref 8.9–10.3)
Chloride: 107 mmol/L (ref 98–111)
Creatinine, Ser: 4.34 mg/dL — ABNORMAL HIGH (ref 0.61–1.24)
GFR, Estimated: 17 mL/min — ABNORMAL LOW (ref >60)
Glucose, Bld: 83 mg/dL (ref 70–99)
Phosphorus: 4.6 mg/dL (ref 2.5–4.6)
Potassium: 4.2 mmol/L (ref 3.5–5.1)
Sodium: 139 mmol/L (ref 135–145)

## 2020-08-28 LAB — IRON AND TIBC
Iron: 19 ug/dL — ABNORMAL LOW (ref 45–182)
Saturation Ratios: 8 % — ABNORMAL LOW (ref 17.9–39.5)
TIBC: 251 ug/dL (ref 250–450)
UIBC: 232 ug/dL

## 2020-08-28 LAB — VITAMIN B12: Vitamin B-12: 268 pg/mL (ref 180–914)

## 2020-08-28 LAB — PROTIME-INR
INR: 2.6 — ABNORMAL HIGH (ref 0.8–1.2)
Prothrombin Time: 27.7 seconds — ABNORMAL HIGH (ref 11.4–15.2)

## 2020-08-28 LAB — PREPARE RBC (CROSSMATCH)

## 2020-08-28 LAB — HEMOGLOBIN AND HEMATOCRIT, BLOOD
HCT: 29.3 % — ABNORMAL LOW (ref 39.0–52.0)
Hemoglobin: 8.8 g/dL — ABNORMAL LOW (ref 13.0–17.0)

## 2020-08-28 LAB — FOLATE: Folate: 6.2 ng/mL (ref >5.9)

## 2020-08-28 MED ORDER — WARFARIN SODIUM 5 MG PO TABS
6.0000 mg | ORAL_TABLET | Freq: Every day | ORAL | Status: DC
  Administered 2020-08-28 – 2020-08-30 (×3): 6 mg via ORAL
  Filled 2020-08-28 (×3): qty 1

## 2020-08-28 MED ORDER — SODIUM CHLORIDE 0.9% IV SOLUTION: Freq: Once | INTRAVENOUS | Status: AC

## 2020-08-28 MED ORDER — SODIUM CHLORIDE 0.9 % IV SOLN
25.0000 mg | Freq: Once | INTRAVENOUS | Status: AC
  Administered 2020-08-28: 25 mg via INTRAVENOUS
  Filled 2020-08-28: qty 0.5

## 2020-08-28 MED ORDER — SODIUM CHLORIDE 0.9 % IV SOLN
1000.0000 mg | Freq: Once | INTRAVENOUS | Status: AC
  Administered 2020-08-28: 1000 mg via INTRAVENOUS
  Filled 2020-08-28 (×2): qty 20

## 2020-08-28 NOTE — Progress Notes (Addendum)
CSW spoke with Prem at [facility] can accommodate suboxone but not the patient's insurance.  CSW spoke with Juliann Pulse at [facility] who states neither [facility] or [facility] can accept this patient.  CSW spoke with Helene Kelp at [facility] who states the facility does not accept patient on suboxone.  Madilyn Fireman, MSW, LCSW Transitions of Care  Clinical Social Worker II [phone]

## 2020-08-28 NOTE — Progress Notes (Signed)
Patient ID: Alex [name], male   DOB: 1981/03/11, 40 y.o.   MRN: [id number] S: Feels well, no complaints. O:BP 131/88 (BP Location: Right Arm)   Pulse 96   Temp 98.6 F (37 C) (Oral)   Resp 18   Ht '6\' 4"'$  (1.93 m)   Wt (!) 138.8 kg   SpO2 98%   BMI 37.24 kg/m   Intake/Output Summary (Last 24 hours) at 08/28/2020 1417 Last data filed at 08/28/2020 1317 Gross per 24 hour  Intake 1897.29 ml  Output 2775 ml  Net -877.71 ml   Intake/Output: I/O last 3 completed shifts: In: 1507.3 [P.O.:954; IV Piggyback:553.3] Out: 4020 [Urine:4020]  Intake/Output this shift:  Total I/O In: 1104 [P.O.:1074; Blood:30] Out: [id number] [Urine:1475] Weight change: 2.208 kg Gen: NAD CVS: RRR Resp: cta Abd: benign Ext: 2 + bilateral lower extremity edema to thighs  Recent Labs  Lab 08/22/20 0203 08/22/20 1028 08/23/20 0155 08/24/20 0128 08/25/20 0203 08/26/20 0123 08/27/20 0119 08/28/20 0634  NA  --  136 134* 135 136 136 138 139  K  --  3.4* 3.9 3.7 3.7 3.6 3.9 4.2  CL  --  103 100 101 105 102 108 107  CO2  --  '27 24 26 25 25 24 25  '$ GLUCOSE  --  116* 96 110* 85 108* 91 83  BUN  --  16 21* 30* 35* 38* 40* 41*  CREATININE  --  2.81* 3.25* 3.89* 4.19* 4.24* 4.25* 4.34*  ALBUMIN  --   --  1.9* 1.9* 1.9* 2.0* 1.8* 1.9*  CALCIUM  --  8.5* 8.2* 8.2* 8.2* 8.4* 8.4* 8.4*  PHOS 2.4*  --  3.1 3.7 4.1 4.1 4.3 4.6   Liver Function Tests: Recent Labs  Lab 08/26/20 0123 08/27/20 0119 08/28/20 0634  ALBUMIN 2.0* 1.8* 1.9*   No results for input(s): LIPASE, AMYLASE in the last 168 hours. No results for input(s): AMMONIA in the last 168 hours. CBC: Recent Labs  Lab 08/24/20 0128 08/24/20 1016 08/25/20 0203 08/25/20 0918 08/26/20 0123 08/27/20 0119 08/28/20 0634  WBC 8.1  --  7.5  --  6.6 6.9 5.6  HGB 6.8*   < > 6.5*   < > 7.6* 7.7* 6.7*  HCT 22.0*   < > 20.9*   < > 24.5* 24.8* 22.1*  MCV 89.1  --  86.7  --  86.9 87.9 88.4  PLT 198  --  197  --  220 229 217   < > = values in this interval  not displayed.   Cardiac Enzymes: No results for input(s): CKTOTAL, CKMB, CKMBINDEX, TROPONINI in the last 168 hours. CBG: No results for input(s): GLUCAP in the last 168 hours.  Iron Studies:  Recent Labs    08/28/20 0931  IRON 19*  TIBC 251  FERRITIN 337*   Studies/Results: No results found. . (feeding supplement) PROSource Plus  30 mL Oral BID BM  . buprenorphine-naloxone  1 tablet Sublingual BID  . Chlorhexidine Gluconate Cloth  6 each Topical Daily  . collagenase   Topical Daily  . darbepoetin (ARANESP) injection - NON-DIALYSIS  150 mcg Subcutaneous Q Sat-1800  . diltiazem  180 mg Oral Daily  . famotidine  20 mg Oral BID  . iron polysaccharides  150 mg Oral Daily  . lidocaine  1 patch Transdermal Q24H  . melatonin  3 mg Oral QHS  . methocarbamol  500 mg Oral TID  . nicotine  14 mg Transdermal Daily  . polyethylene glycol  17  g Oral Daily  . senna-docusate  1 tablet Oral BID  . warfarin  6 mg Oral q1600  . Warfarin - Pharmacist Dosing Inpatient   Does not apply q1600    BMET    Component Value Date/Time   NA 139 08/28/2020 0634   K 4.2 08/28/2020 0634   CL 107 08/28/2020 0634   CO2 25 08/28/2020 0634   GLUCOSE 83 08/28/2020 0634   BUN 41 (H) 08/28/2020 0634   CREATININE 4.34 (H) 08/28/2020 0634   CALCIUM 8.4 (L) 08/28/2020 0634   GFRNONAA 17 (L) 08/28/2020 0634   GFRAA >90 03/10/2012 1808   CBC    Component Value Date/Time   WBC 5.6 08/28/2020 0634   RBC 3.11 (L) 08/28/2020 0931   RBC 2.50 (L) 08/28/2020 0634   HGB 6.7 (LL) 08/28/2020 0634   HCT 22.1 (L) 08/28/2020 0634   PLT 217 08/28/2020 0634   MCV 88.4 08/28/2020 0634   MCH 26.8 08/28/2020 0634   MCHC 30.3 08/28/2020 0634   RDW 14.9 08/28/2020 0634   LYMPHSABS 1.3 08/16/2020 0941   MONOABS 1.0 08/16/2020 0941   EOSABS 0.1 08/16/2020 0941   BASOSABS 0.0 08/16/2020 0941     Assessment/Plan:  1. AKI - presumably dense ATN related to SBE and hypotension.  Was started on CRRT 07/18/20 and  transitioned to IHD on 5/16-5/30/22 with some increased UOP and stabilization of SCr.  Will consult IR tomorrow if Scr remains stable to remove TDC. 2. 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  Currently on IV cefepime via PICC. 3. Anemia of critical illness and ABLA following surgery.  On ESA and receiving transfusion today. 4. HTN - stable 5. A flutter with RVR - cardiology following.  6. Lower extremity edema - due to AKI as well as severe protein malnutrition with hypoalbuminemia.  Diuresing on his own.   Donetta Potts, MD [employer] [phone]

## 2020-08-28 NOTE — Progress Notes (Signed)
PT Cancellation Note  Patient Details Name: JOHNY MISSEL MRN: [id number] DOB: 01-Apr-1980   Cancelled Treatment:    Reason Eval/Treat Not Completed: (P) Pain limiting ability to participate (pt c/o R foot pain, just had x-ray no acute fx but pt defers OOB at this time.)  Will continue efforts per PT POC as schedule permits.   Taleya Whitcher M Mandee Pluta 08/28/2020, 6:15 PM

## 2020-08-28 NOTE — Progress Notes (Signed)
ANTICOAGULATION CONSULT NOTE - Follow Up Consult  Pharmacy Consult for Warfarin Indication: mechanical aortic valve  Labs: Recent Labs    08/26/20 0123 08/27/20 0119 08/28/20 0634  HGB 7.6* 7.7* 6.7*  HCT 24.5* 24.8* 22.1*  PLT 220 229 217  LABPROT 28.3* 25.1* 27.7*  INR 2.7* 2.3* 2.6*  CREATININE 4.24* 4.25* 4.34*   Assessment: 39yom with recent endocarditis> AV root repair and Mechanical AVR on 08/03/20. Warfarin started 5/13. Left AMA 5/16 pm then returned 5/17 pm. INR 2.6 on 5/18 and Warfarin resumed. Pharmacy was consulted to bridge warfarin with heparin until INR >2.5 again. Heparin stopped 6/1.  INR back in range at 2.6. He seems sensitive to dose changes. We will try to put on a standing dose today and see how it goes.  hgb 6.7 plt wnl  Goal of Therapy:  INR 2.5-3.5 per Dr. Kipp Brood Monitor platelets by anticoagulation protocol: Yes   Plan:  Coumadin '6mg'$  PO qday Monitor daily PT/INR Watch CBC and s/sx bleeding  Onnie Boer, PharmD, BCIDP, AAHIVP, CPP Infectious Disease Pharmacist 08/28/2020 9:47 AM

## 2020-08-28 NOTE — TOC Progression Note (Addendum)
Transition of Care [facility]) - Progression Note    Patient Details  Name: Alex [name] MRN: [id number] Date of Birth: [birth year]  Transition of Care [facility]) CM/SW [place], RN Phone Number: 08/28/2020, 8:52 AM  Clinical Narrative:    Case management spoke with Raquel Sarna, CM with [facility] on 08/25/2020 and [facility] is requesting a peer to peer with the attending physician, Dr. Avon Gully.  Dr. Avon Gully was notified on 08/25/2020 and was updated for request for peer to peer until 08/28/2020.  I spoke with Erin Hearing, NP and she confirmed that the patient may not qualify for LTAC placement and may be more appropriate for SNF placement.  Patient's FL2 was updated and sent out in the hub along with new message sent to [facility] to see if patient could be placed at [facility] for SNF placement at this time.  The patient remains on IV antibiotics - Maxipime IV every 12 hours through the end of June to treat endocarditis.  The patient has history of abuse, including nicotine, heroine, and methamphetamines - with treatment using Suboxone.  Patient's history and current medical treatment may be a barrier to SNF placement at this time, but CM and MSW will continue to explore options for placement - otherwise patient will remain hospitalized until IV antibiotics are complete.  08/28/2020 1000 - Raquel Sarna, CM at [facility] is having the attending MD at [facility] peer to peer call for insurance authorization.  Prem, CM with [facility] does not have available beds for placement at this time and does not accept [facility] for SNF placement.  08/28/2020 1405 - CM spoke with Fuller Mandril, [facility] and the patient has been denied reimbursement for inpatient admission to [facility] after 08/25/2020.  [place], RNCM states that Dr. Doy Hutching is aware of the matter and that [facility] physician has a scheduled peer to peer with the insurance company tomorrow.  I updated Elana Alm,  MSW and Andreas Newport, [facility] and made them aware of the insurance determination and will follow up.   Expected Discharge Plan: Long Term Acute Care (LTAC) Barriers to Discharge: Continued Medical Work up  Expected Discharge Plan and Services Expected Discharge Plan: [place] (LTAC)   Discharge Planning Services: CM Consult Post Acute Care Choice: Long Term Acute Care (LTAC) Living arrangements for the past 2 months: Single Family Home                                       Social Determinants of Health (SDOH) Interventions    Readmission Risk Interventions Readmission Risk Prevention Plan 08/25/2020  Medication Review (RN Care Manager) Complete  SW Recovery Care/Counseling Consult Complete  Skilled Nursing Facility Complete  Some recent data might be hidden

## 2020-08-28 NOTE — Progress Notes (Signed)
Patient with unchanged edema in lower extremities, Edema in right LE > than Left,  however patient with complaints of pain in the right foot today. Erin Hearing NP aware orders were written. Will monitor patient. Bee Marchiano, Bettina Gavia RN

## 2020-08-28 NOTE — NC FL2 (Signed)
[place] LEVEL OF CARE SCREENING TOOL     IDENTIFICATION  Patient Name: Alex [name] Birthdate: 06/17/1980 Sex: male Admission Date (Current Location): 08/08/2020  [facility] and [place] Number:  Herbalist and Address:  [facility] [place], [place] [address], [place], [place] [address]      Provider Number: [id number]  Attending Physician Name and Address:  Little Ishikawa, MD  Relative Name and Phone Number:  Torreon Musick (mother)- [phone]    Current Level of Care: Hospital Recommended Level of Care: [place] Prior Approval Number:    Date Approved/Denied:   PASRR Number: [id number]  Discharge Plan: SNF    Current Diagnoses: Patient Active Problem List   Diagnosis Date Noted  . Typical atrial flutter ([place])   . Atrial fibrillation with RVR ([place])   . Acute kidney injury (AKI) with acute tubular necrosis (ATN) (HCC)   . Endocarditis 08/08/2020  . Anemia 08/08/2020  . Cerebral embolism with cerebral infarction 07/24/2020  . 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 Aortic valve endocarditis   . Pressure injury of skin 07/20/2020  . Elevated LFTs   . Sepsis ([place]) 07/18/2020  . Septic shock ([place]) 07/18/2020  . Altered mental status 07/18/2020  . Acute renal failure with oliguria ([place])   . Hypotension   . Elevated bilirubin   . Thrombocytopenia ([place])   . Plantar fasciitis, left 07/12/2020  . Essential hypertension 01/14/2020  . 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 Nicotine dependence, cigarettes, uncomplicated [id number]  . Opioid dependence on agonist therapy ([place]) 09/24/2017  . Opioid use disorder 09/24/2017    Orientation RESPIRATION BLADDER Height & Weight     Self,Time,Situation,Place  Normal Continent (possible discontinuance of hemodialysis - last HD session 1 week ago) Weight: (!) 138.8 kg Height:  '6\' 4"'$  (193 cm)  BEHAVIORAL SYMPTOMS/MOOD NEUROLOGICAL BOWEL NUTRITION STATUS       Continent Diet (See discharge summary)  AMBULATORY STATUS COMMUNICATION OF NEEDS Skin   Supervision (Supervision with stairs due to buckling to RLE) Verbally Normal                       Personal Care Assistance Level of Assistance  Bathing,Feeding,Dressing Bathing Assistance: Independent Feeding assistance: Independent Dressing Assistance: Independent     Functional Limitations Info  Sight,Hearing,Speech Sight Info: Adequate Hearing Info: Adequate Speech Info: Adequate    SPECIAL CARE FACTORS FREQUENCY   (Needs IV antibiotics - Maxipime IV Q 12 hours, chronic Suboxone treatment)                    Contractures Contractures Info: Not present    Additional Factors Info  Code Status,Allergies,Psychotropic Code Status Info: Full code Allergies Info: NKDA Psychotropic Info: Klonopin         Current Medications (08/28/2020):  This is the current hospital active medication list Current Facility-Administered Medications  Medication Dose Route Frequency Provider Last Rate Last Admin  . (feeding supplement) PROSource Plus liquid 30 mL  30 mL Oral BID BM Lavina Hamman, MD   30 mL at 08/27/20 1301  . 0.9 %  sodium chloride infusion (Manually program via Guardrails IV Fluids)   Intravenous Once Samella Parr, NP      . acetaminophen (TYLENOL) tablet 650 mg  650 mg Oral Q6H PRN Shela Leff, MD   650 mg at 08/27/20 1746   Or  . acetaminophen (TYLENOL) suppository 650 mg  650 mg Rectal  Q6H PRN Shela Leff, MD      . bisacodyl (DULCOLAX) suppository 10 mg  10 mg Rectal Daily PRN Lavina Hamman, MD   10 mg at 08/11/20 1808  . buprenorphine-naloxone (SUBOXONE) 8-2 mg per SL tablet 1 tablet  1 tablet Sublingual BID Lavina Hamman, MD   1 tablet at 08/27/20 2047  . ceFEPIme (MAXIPIME) 2 g in sodium chloride 0.9 % 100 mL IVPB  2 g Intravenous Q12H Wendee Beavers, RPH 200 mL/hr at 08/28/20 0605 2 g at 08/28/20 [id number]  . Chlorhexidine Gluconate Cloth 2 % PADS 6 each   6 each Topical Daily Shela Leff, MD   6 each at 08/27/20 1127  . clonazePAM (KLONOPIN) tablet 0.5 mg  0.5 mg Oral BID PRN Lavina Hamman, MD   0.5 mg at 08/28/20 0128  . collagenase (SANTYL) ointment   Topical Daily Lavina Hamman, MD   Given at 08/27/20 1635  . Darbepoetin Alfa (ARANESP) injection 150 mcg  150 mcg Subcutaneous Q Sat-1800 Claudia Desanctis, MD   150 mcg at 08/26/20 1834  . diltiazem (CARDIZEM CD) 24 hr capsule 180 mg  180 mg Oral Daily Minus Breeding, MD   180 mg at 08/27/20 0943  . famotidine (PEPCID) tablet 20 mg  20 mg Oral BID Samella Parr, NP   20 mg at 08/27/20 2046  . iron polysaccharides (NIFEREX) capsule 150 mg  150 mg Oral Daily Claudia Desanctis, MD   150 mg at 08/27/20 0943  . lactulose (CHRONULAC) 10 GM/15ML solution 30 g  30 g Oral BID PRN Oswald Hillock, MD      . lidocaine (LIDODERM) 5 % 1 patch  1 patch Transdermal Q24H Shela Leff, MD   1 patch at 08/28/20 0128  . melatonin tablet 3 mg  3 mg Oral QHS Lavina Hamman, MD   3 mg at 08/27/20 2046  . methocarbamol (ROBAXIN) tablet 500 mg  500 mg Oral TID Lavina Hamman, MD   500 mg at 08/27/20 2046  . nicotine (NICODERM CQ - dosed in mg/24 hours) patch 14 mg  14 mg Transdermal Daily Lavina Hamman, MD   14 mg at 08/27/20 0942  . ondansetron (ZOFRAN) tablet 4 mg  4 mg Oral Q6H PRN Lavina Hamman, MD       Or  . ondansetron [facility]) injection 4 mg  4 mg Intravenous Q6H PRN Lavina Hamman, MD      . polyethylene glycol (MIRALAX / GLYCOLAX) packet 17 g  17 g Oral Daily Lavina Hamman, MD   17 g at 08/27/20 0943  . senna-docusate (Senokot-S) tablet 1 tablet  1 tablet Oral BID Lavina Hamman, MD   1 tablet at 08/27/20 2046  . Warfarin - Pharmacist Dosing Inpatient   Does not apply q1600 Lavina Hamman, MD   Given at 08/27/20 1625     Discharge Medications: Please see discharge summary for a list of discharge medications.  Relevant Imaging Results:  Relevant Lab Results:   Additional  Information SS# [id number]  Curlene Labrum, RN

## 2020-08-28 NOTE — Progress Notes (Addendum)
[facility] PROGRESS NOTE  Alex [name] [id number] DOB: 02/25/1981 DOA: 08/08/2020 PCP: [facility]  Status: Remains inpatient appropriate because:Unsafe d/c plan, IV treatments appropriate due to intensity of illness or inability to take PO and Inpatient level of care appropriate due to severity of illness. IVDA SO NOT AN APPROPRIATE CANDIDATE FOR HH ANTIBIOTICS VIA PICC LINE   Dispo: The patient is from: Home              Anticipated d/c is to: Home after rehabilitation.  Insurance declined LTAC level of care but given the need for Suboxone which would exclude patient from going to an SNF the physician at the [facility] is submitting a peer to peer              Patient currently is not medically stable to d/c.   Difficult to place patient Yes   Level of care: Med-Surg  Code Status: Full Family Communication:  DVT prophylaxis: Heparin infusion COVID vaccination status: Received first dose of Pfizer on 08/16/2020  HPI: 40 year old male with history of polysubstance abuse, aortic valve endocarditis, Serratia bacteremia, septic emboli causing stroke and acute kidney injury requiring hemodialysis.  Patient left hospital AMA on 08/08/2020.  He came back same night after discussion with CT surgery.  Significant events:  4/26 transfer from [facility]  CT head 4/26 >> no acute findings  CT abd/pelvis 4/26 >> cholelithiasis w/o inflammatory chagnes, mild hepatomegaly (25 cm), moderate splenomegaly (16.7 cm), 2 mm non obstructing kidney stone on Lt  4/27 CVL placed. Pressors escalating.   4/28 intubated for TEE, CVC, HD, and A-line placed. CRRT started   Remains on CRRT  MRI on 4/30-multiple infarcts  5/4: repeat TEE shows an aortic valve abscess  5/5: extubated , self-removed HD cath so CRRT holiday> developed severe hyperkalemia overnight.  5/6: new temp HD cath placed   5/8: new temp HD cath placed after prior one self-removed by  patient  5/9: Orthopantogram neg for abscess  5/10: HD cath replaced by IR [facility]  5/12 to OR for AVR, aortic root reconstruction, closure of aortic sinus track. To 2H ICU on vent post op. 4 units PRBC given intraop.   5/13 extubated, on CRRT, remains on pressors  5/18, left AMA.  5/30 continues on intermittent hemodialysis as directed by nephrology team.  Continues with excellent urinary output.  Creatinine between 2.9 and 4.5  Subjective: Alert.  Denies dizziness or chest pain.  She is answered regarding discharge disposition.  Objective: Vitals:   08/28/20 0405 08/28/20 0738  BP: 130/70 (!) 132/95  Pulse: 76 95  Resp: 17 18  Temp: 98.2 F (36.8 C) (!) 97.5 F (36.4 C)  SpO2: 98% 95%    Intake/Output Summary (Last 24 hours) at 08/28/2020 0827 Last data filed at 08/28/2020 0731 Gross per 24 hour  Intake 1507.29 ml  Output 2700 ml  Net -1192.71 ml   Filed Weights   08/26/20 0517 08/27/20 0436 08/28/20 0405  Weight: (!) 137.2 kg (!) 136.6 kg (!) 138.8 kg    Exam:  Constitutional: Alert, pale in appearance, calm in no acute distress Respiratory: Lungs are clear, stable on room air Cardiovascular: S1-S2, no JVD, regular pulse without recurrence of atrial fibrillation/flutter lower extremity peripheral edema R>L with bilateral Ace wraps in place. Abdomen:  LBM 6/1, normoactive bowel sounds.  Eating well. Genitourinary: Voids easily Neurologic: CN 2-12 grossly intact. Sensation intact, DTR normal. Strength 5/5 x all 4 extremities.  Psychiatric: Alert  and oriented 3.  Pleasant affect.   Assessment/Plan: Acute problems: Aortic valve endocarditis with perivalvular abscess-Serratia bacteremia, septic emboli causing strokes.   -Presented with sepsis physiology on 4/26 eventually was transferred to ICU, was intubated.   -MRI brain on 07/22/2020 revealed multiple acute infarcts.   -Blood cultures positive for Serratia and Echocardiogram showed severe AI and AR with AV  vegetation confirmed by TEE.   -Now s/p mechanical aortic valve replacement, aortic root construction and closure of aortic sinus tract.   -Needs 6 weeks of cefepime per ID with end date 09/15/20-REMAIN IP FOR IV ANTIBIOTICS SINCE NOT A CANDIDATE FOR OP THERAPIES 2/2 H/O IVDA  -Continue Coumadin per pharmacy.    -Patient left AMA on 08/07/2020 however came back to hospital after speaking with CT surgery on 08/08/2020.  Recurrent Anemia/thrombocytopenia -patient is s/p 8units PRBC during last hospital stay.   -Transfuse PRBC as needed for hemoglobin less than 7.  -6/6 hemoglobin down to 6.7 so we will give an additional unit of PRBCs today -Hemolysis work-up negative, heme negative -continue Aranesp -Etiology likely secondary to renal dysfunction and overall chronic debility -Continue niferex and check iron level in the event needs a dose of IV iron followed by regular oral iron replaced **iron level 19 with a sat of 18 and ferritin 337.  In addition to currently scheduled oral iron we will give a dose of IV iron today.  Atrial fibrillation/flutter with RVR -Evaluated by cardiology -Maintaining sinus rhythm on CCB and BB  -Continue warfarin for now.  -CHA2DS2-VASc equals 3  Acute kidney injury -likely from ATN from infection -Nephrology following  -Last HD 5/30. Per renal if Cr remains stable 6/7 plan is to consult IR for removal  Polysubstance abuse -patient has amphetamine abuse, nicotine dependence, opiate dependence on agonist therapy.  He is on chronic Suboxone.  Bilateral lower extremity edema -Markedly improved  -Continue ACE wraps (wrap in Kerlix before applying ACE due to prexisting skin irritation) -6/6 complaining of pain in right foot especially around the toes-likely just secondary to ongoing significant edema but will check x-ray to rule out bony abnormality  Obesity BMI-34.14kg/m    Data Reviewed: Basic Metabolic Panel: Recent Labs  Lab 08/22/20 0203  08/22/20 1028 08/23/20 0155 08/24/20 0128 08/25/20 0203 08/26/20 0123 08/27/20 0119 08/28/20 0634  NA  --    < > 134* 135 136 136 138 139  K  --    < > 3.9 3.7 3.7 3.6 3.9 4.2  CL  --    < > 100 101 105 102 108 107  CO2  --    < > '24 26 25 25 24 25  '$ GLUCOSE  --    < > 96 110* 85 108* 91 83  BUN  --    < > 21* 30* 35* 38* 40* 41*  CREATININE  --    < > 3.25* 3.89* 4.19* 4.24* 4.25* 4.34*  CALCIUM  --    < > 8.2* 8.2* 8.2* 8.4* 8.4* 8.4*  MG 1.8  --  1.8 1.9 1.9 2.0  --   --   PHOS 2.4*  --  3.1 3.7 4.1 4.1 4.3 4.6   < > = values in this interval not displayed.   Liver Function Tests: Recent Labs  Lab 08/24/20 0128 08/25/20 0203 08/26/20 0123 08/27/20 0119 08/28/20 0634  ALBUMIN 1.9* 1.9* 2.0* 1.8* 1.9*     CBC: Recent Labs  Lab 08/24/20 0128 08/24/20 1016 08/25/20 0203 08/25/20 [id number] 08/26/20 0123 08/27/20  0119 08/28/20 0634  WBC 8.1  --  7.5  --  6.6 6.9 5.6  HGB 6.8*   < > 6.5* 8.4* 7.6* 7.7* 6.7*  HCT 22.0*   < > 20.9* 26.6* 24.5* 24.8* 22.1*  MCV 89.1  --  86.7  --  86.9 87.9 88.4  PLT 198  --  197  --  220 229 217   < > = values in this interval not displayed.    BNP (last 3 results) Recent Labs    07/25/20 0334 07/26/20 0331  BNP 153.5* 76.0     Continuous Infusions: . ceFEPime (MAXIPIME) IV 2 g (08/28/20 [id number])    Principal Problem:   Endocarditis Active Problems:   Opioid use disorder   Acute renal failure with oliguria (HCC)   Cerebral embolism with cerebral infarction   Anemia   Acute kidney injury (AKI) with acute tubular necrosis (ATN) (HCC)   Atrial fibrillation with RVR (HCC)   Typical atrial flutter ([place])   Consultants:  Infectious disease  PCCM  Neurology  CT surgery  Procedures:  Echocardiogram  TEE  CRRT  Cortrack  Antibiotics: Anti-infectives (From admission, onward)   Start     Dose/Rate Route Frequency Ordered Stop   08/25/20 1800  ceFEPIme (MAXIPIME) 2 g in sodium chloride 0.9 % 100 mL IVPB        2  g 200 mL/hr over 30 Minutes Intravenous Every 12 hours 08/25/20 1618     08/20/20 2200  ceFEPIme (MAXIPIME) 1 g in sodium chloride 0.9 % 100 mL IVPB  Status:  Discontinued        1 g 200 mL/hr over 30 Minutes Intravenous Every 24 hours 08/20/20 1523 08/25/20 1618   08/13/20 2200  ceFEPIme (MAXIPIME) 2 g in sodium chloride 0.9 % 100 mL IVPB  Status:  Discontinued        2 g 200 mL/hr over 30 Minutes Intravenous Every 24 hours 08/13/20 1154 08/20/20 1523   08/09/20 2200  ceFEPIme (MAXIPIME) 1 g in sodium chloride 0.9 % 100 mL IVPB  Status:  Discontinued        1 g 200 mL/hr over 30 Minutes Intravenous Every 24 hours 08/09/20 1238 08/13/20 1154   08/09/20 0000  ceFEPIme (MAXIPIME) 1 g in sodium chloride 0.9 % 100 mL IVPB  Status:  Discontinued        1 g 200 mL/hr over 30 Minutes Intravenous Every 24 hours 08/08/20 2305 08/09/20 1238       Time spent: 25 minutes    Erin Hearing ANP  [facility] 7 am - 330 pm/M-F for direct patient care and secure chat Please refer to Amion for contact info 20  days

## 2020-08-29 ENCOUNTER — Inpatient Hospital Stay (HOSPITAL_COMMUNITY): Payer: 59

## 2020-08-29 DIAGNOSIS — D509 Iron deficiency anemia, unspecified: Secondary | ICD-10-CM

## 2020-08-29 DIAGNOSIS — A498 Other bacterial infections of unspecified site: Secondary | ICD-10-CM

## 2020-08-29 HISTORY — PX: IR REMOVAL TUN CV CATH W/O FL: IMG2289

## 2020-08-29 LAB — BPAM RBC
Blood Product Expiration Date: 202206132359
ISSUE DATE / TIME: 202206061246
Unit Type and Rh: 9500

## 2020-08-29 LAB — TYPE AND SCREEN
ABO/RH(D): O NEG
Antibody Screen: NEGATIVE
Unit division: 0

## 2020-08-29 LAB — RENAL FUNCTION PANEL
Albumin: 1.9 g/dL — ABNORMAL LOW (ref 3.5–5.0)
Anion gap: 8 (ref 5–15)
BUN: 44 mg/dL — ABNORMAL HIGH (ref 6–20)
CO2: 25 mmol/L (ref 22–32)
Calcium: 8.5 mg/dL — ABNORMAL LOW (ref 8.9–10.3)
Chloride: 105 mmol/L (ref 98–111)
Creatinine, Ser: 4.19 mg/dL — ABNORMAL HIGH (ref 0.61–1.24)
GFR, Estimated: 18 mL/min — ABNORMAL LOW (ref >60)
Glucose, Bld: 84 mg/dL (ref 70–99)
Phosphorus: 4.5 mg/dL (ref 2.5–4.6)
Potassium: 4 mmol/L (ref 3.5–5.1)
Sodium: 138 mmol/L (ref 135–145)

## 2020-08-29 LAB — PROTIME-INR
INR: 2.9 — ABNORMAL HIGH (ref 0.8–1.2)
Prothrombin Time: 30 seconds — ABNORMAL HIGH (ref 11.4–15.2)

## 2020-08-29 MED ORDER — LIDOCAINE HCL 1 % IJ SOLN
INTRAMUSCULAR | Status: DC | PRN
  Administered 2020-08-29: 5 mL

## 2020-08-29 MED ORDER — LIDOCAINE HCL (PF) 1 % IJ SOLN
INTRAMUSCULAR | Status: AC
  Filled 2020-08-29: qty 30

## 2020-08-29 NOTE — Progress Notes (Signed)
ANTICOAGULATION CONSULT NOTE - Follow Up Consult  Pharmacy Consult for Warfarin Indication: mechanical aortic valve  Labs: Recent Labs    08/27/20 0119 08/28/20 0634 08/28/20 1709 08/29/20 0125  HGB 7.7* 6.7* 8.8*  --   HCT 24.8* 22.1* 29.3*  --   PLT 229 217  --   --   LABPROT 25.1* 27.7*  --  30.0*  INR 2.3* 2.6*  --  2.9*  CREATININE 4.25* 4.34*  --  4.19*   Assessment: 39yom with recent endocarditis> AV root repair and Mechanical AVR on 08/03/20. Warfarin started 5/13. Left AMA 5/16 pm then returned 5/17 pm. INR 2.6 on 5/18 and Warfarin resumed. Pharmacy was consulted to bridge warfarin with heparin until INR >2.5 again. Heparin stopped 6/1.  INR back in range at 2.9. He seems sensitive to dose changes. Hgb was 6.7 yesterday and received PRBC.   hgb 6.7>>8.8 plt wnl  Goal of Therapy:  INR 2.5-3.5 per Dr. Kipp Brood Monitor platelets by anticoagulation protocol: Yes   Plan:  Coumadin '6mg'$  PO qday Monitor daily PT/INR Watch CBC and s/sx bleeding  Onnie Boer, PharmD, BCIDP, AAHIVP, CPP Infectious Disease Pharmacist 08/29/2020 10:43 AM

## 2020-08-29 NOTE — Progress Notes (Signed)
CSW spoke with Raquel Sarna at [facility] who states the peer to peer is scheduled for today with [employer].  Madilyn Fireman, MSW, LCSW Transitions of Care  Clinical Social Worker II [phone]

## 2020-08-29 NOTE — TOC Progression Note (Signed)
Transition of Care (TOC) - Progression Note    Patient Details  Name: Alex [name] MRN: [id number] Date of Birth: 04/14/1980  Transition of Care (TOC) CM/SW Contact   R [place], RN Phone Number: 08/29/2020, 11:46 AM  Clinical Narrative:    CM met with the patient at the bedside to discuss transitions of care.  The patient was informed that the attending physician at [facility] was scheduled for a peer to peer appointment with the patient's insurance company for possible approval for LTAC placement.  The patient is having his HD catheter removed today.  ID physician is also evaluating the patient for IV antibiotics versus po treatments at this time.  The patient remains hospitalized at this time to receive IV antibiotics for infection.  The patient is agreeable to SNF/ LTAC placement or discharge to home if able to transitions to po medications.  The patient is ambulatory and will not need dme at this time.  The patient has recent history of substance abuse and is not a safe discharge to receive IV antibiotics at home through a PICC line.  CM and MSW will continue to follow the patient for transitions of care needs.   Expected Discharge Plan: Long Term Acute Care (LTAC) Barriers to Discharge: Continued Medical Work up  Expected Discharge Plan and Services Expected Discharge Plan: Long Term Acute Care (LTAC)   Discharge Planning Services: CM Consult Post Acute Care Choice: Long Term Acute Care (LTAC) Living arrangements for the past 2 months: Single Family Home                                       Social Determinants of Health (SDOH) Interventions    Readmission Risk Interventions Readmission Risk Prevention Plan 08/25/2020  Medication Review (RN Care Manager) Complete  SW Recovery Care/Counseling Consult Complete  Skilled Nursing Facility Complete  Some recent data might be hidden   

## 2020-08-29 NOTE — Progress Notes (Signed)
[facility] PROGRESS NOTE  Alex [name] [id number] DOB: 26-Aug-1980 DOA: 08/08/2020 PCP: [facility]  Status: Remains inpatient appropriate because:Unsafe d/c plan, IV treatments appropriate due to intensity of illness or inability to take PO and Inpatient level of care appropriate due to severity of illness. IVDA SO NOT AN APPROPRIATE CANDIDATE FOR HH ANTIBIOTICS VIA PICC LINE   Dispo: The patient is from: Home              Anticipated d/c is to: Home after rehabilitation.  Insurance declined LTAC level of care but given the need for Suboxone (which would exclude patient from going to an SNF) the physician at the [facility] is submitting a peer to peer on 6/7.  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  Discussed with ID and patient is not appropriate to transition over to oral antibiotics at this time.              Patient currently is not medically stable to d/c.   Difficult to place patient Yes   Level of care: Med-Surg  Code Status: Full Family Communication:  DVT prophylaxis: Heparin infusion COVID vaccination status: Received first dose of Pfizer on 08/16/2020  HPI: 40 year old male with history of polysubstance abuse, aortic valve endocarditis, Serratia bacteremia, septic emboli causing stroke and acute kidney injury requiring hemodialysis.  Patient left hospital AMA on 08/08/2020.  He came back same night after discussion with CT surgery.  Significant events:  4/26 transfer from [facility] to [facility]  CT head 4/26 >> no acute findings  CT abd/pelvis 4/26 >> cholelithiasis w/o inflammatory chagnes, mild hepatomegaly (25 cm), moderate splenomegaly (16.7 cm), 2 mm non obstructing kidney stone on Lt  4/27 CVL placed. Pressors escalating.   4/28 intubated for TEE, CVC, HD, and A-line placed. CRRT  started   Remains on CRRT  MRI on 4/30-multiple infarcts  5/4: repeat TEE shows an aortic valve abscess  5/5: extubated , self-removed HD cath so CRRT holiday> developed severe hyperkalemia overnight.  5/6: new temp HD cath placed   5/8: new temp HD cath placed after prior one self-removed by patient  5/9: Orthopantogram neg for abscess  5/10: HD cath replaced by IR [facility]  5/12 to OR for AVR, aortic root reconstruction, closure of aortic sinus track. To 2H ICU on vent post op. 4 units PRBC given intraop.   5/13 extubated, on CRRT, remains on pressors  5/18, left AMA.  5/30 continues on intermittent hemodialysis as directed by nephrology team.  Continues with excellent urinary output.  Creatinine between 2.9 and 4.5  Subjective: Alert, no reports of pain, nausea or dizziness.  No chest pain or shortness of breath.  Objective: Vitals:   08/28/20 2316 08/29/20 0406  BP: 124/83 134/90  Pulse: 85 79  Resp: 20 20  Temp: 98 F (36.7 C) 98.3 F (36.8 C)  SpO2: 98% 98%    Intake/Output Summary (Last 24 hours) at 08/29/2020 0802 Last data filed at 08/29/2020 [id number] Gross per 24 hour  Intake 1736.65 ml  Output 2225 ml  Net -488.35 ml   Filed Weights   08/27/20 0436 08/28/20 0405 08/29/20 0406  Weight: (!) 136.6 kg (!) 138.8 kg (!) 137.9 kg    Exam:  Constitutional: Pale in appearance, calm and comfortable, performing ADLs standing in front of sink Respiratory:'s are clear.  He remained stable  on room air with O2 sats 98 to 100% Cardiovascular: [id number] with valvular click, regular rate with out tachycardia.  Lower extremity peripheral edema R>L with bilateral Ace wraps in place. Abdomen:  LBM 6/5, soft nontender with normoactive bowel sounds and eating well Genitourinary: Voids easily Neurologic: CN 2-12 grossly intact. Sensation intact, DTR normal. Strength 5/5 x all 4 extremities.  Psychiatric:    Assessment/Plan: Acute problems: Aortic valve endocarditis with  perivalvular abscess-Serratia bacteremia, septic emboli causing strokes.   -Presented with sepsis physiology on 4/26 eventually was transferred to ICU, was intubated.   -MRI brain on 07/22/2020 revealed multiple acute infarcts.   -Blood cultures positive for Serratia and Echocardiogram showed severe AI and AR with AV vegetation confirmed by TEE.   -Now s/p mechanical aortic valve replacement, aortic root construction and closure of aortic sinus tract.   -Needs 6 weeks of cefepime per ID with end date 09/15/20-REMAIN IP FOR IV ANTIBIOTICS SINCE NOT A CANDIDATE FOR OP THERAPIES 2/2 H/O IVDA  -Continue Coumadin per pharmacy.    -Patient left AMA on 08/07/2020 however came back to hospital after speaking with CT surgery on 08/08/2020.  Recurrent Anemia/thrombocytopenia -patient is s/p 3 units PRBC during this hospital stay.   -Transfuse PRBC as needed for hemoglobin less than 7.  -Hemolysis work-up negative, heme negative -continue Aranesp -Etiology likely secondary to renal dysfunction, persistent significant iron deficiency and overall chronic debility -Continue niferex -6/6 **iron level 19 with a sat of 18 and ferritin 337 given 1000 mg of IV iron -6/7 hemoglobin back up to 8.4 with plans to check hemoglobin again on 6/9  Atrial fibrillation/flutter with RVR -Evaluated by cardiology -Maintaining sinus rhythm on CCB and BB  -Continue warfarin -patient will need to follow-up with cardiology in [place] to establish at their Coumadin clinic -CHA2DS2-VASc equals 3  Acute kidney injury -likely from ATN from infection -Nephrology following  -Last HD 5/30. Per renal if Cr remains stable 6/7 plan is to consult IR for removal  Polysubstance abuse -patient has amphetamine abuse, nicotine dependence, opiate dependence on agonist therapy.   -He was on chronic Suboxone therapy at a clinic [facility] prior to admission.  Bilateral lower extremity edema -Markedly improved  -Continue  ACE wraps (wrap in Kerlix before applying ACE due to prexisting skin irritation) -6/6 complaining of pain in right foot with normal x-ray  Obesity BMI-34.14kg/m    Data Reviewed: Basic Metabolic Panel: Recent Labs  Lab 08/23/20 0155 08/24/20 0128 08/25/20 0203 08/26/20 0123 08/27/20 0119 08/28/20 0634 08/29/20 0125  NA 134* 135 136 136 138 139 138  K 3.9 3.7 3.7 3.6 3.9 4.2 4.0  CL 100 101 105 102 108 107 105  CO2 '24 26 25 25 24 25 25  '$ GLUCOSE 96 110* 85 108* 91 83 84  BUN 21* 30* 35* 38* 40* 41* 44*  CREATININE 3.25* 3.89* 4.19* 4.24* 4.25* 4.34* 4.19*  CALCIUM 8.2* 8.2* 8.2* 8.4* 8.4* 8.4* 8.5*  MG 1.8 1.9 1.9 2.0  --   --   --   PHOS 3.1 3.7 4.1 4.1 4.3 4.6 4.5   Liver Function Tests: Recent Labs  Lab 08/25/20 0203 08/26/20 0123 08/27/20 0119 08/28/20 0634 08/29/20 0125  ALBUMIN 1.9* 2.0* 1.8* 1.9* 1.9*     CBC: Recent Labs  Lab 08/24/20 0128 08/24/20 1016 08/25/20 0203 08/25/20 0918 08/26/20 0123 08/27/20 0119 08/28/20 0634 08/28/20 1709  WBC 8.1  --  7.5  --  6.6 6.9 5.6  --  HGB 6.8*   < > 6.5* 8.4* 7.6* 7.7* 6.7* 8.8*  HCT 22.0*   < > 20.9* 26.6* 24.5* 24.8* 22.1* 29.3*  MCV 89.1  --  86.7  --  86.9 87.9 88.4  --   PLT 198  --  197  --  220 229 217  --    < > = values in this interval not displayed.    BNP (last 3 results) Recent Labs    07/25/20 0334 07/26/20 0331  BNP 153.5* 76.0     Continuous Infusions: . ceFEPime (MAXIPIME) IV 2 g (08/29/20 [id number])    Principal Problem:   Endocarditis Active Problems:   Opioid use disorder   Acute renal failure with oliguria (HCC)   Cerebral embolism with cerebral infarction   Anemia   Acute kidney injury (AKI) with acute tubular necrosis (ATN) (HCC)   Atrial fibrillation with RVR (HCC)   Typical atrial flutter ([place])   Consultants:  Infectious disease  PCCM  Neurology  CT surgery  Procedures:  Echocardiogram  TEE  CRRT  Cortrack  Antibiotics: Anti-infectives (From  admission, onward)   Start     Dose/Rate Route Frequency Ordered Stop   08/25/20 1800  ceFEPIme (MAXIPIME) 2 g in sodium chloride 0.9 % 100 mL IVPB        2 g 200 mL/hr over 30 Minutes Intravenous Every 12 hours 08/25/20 1618 09/15/20 2359   08/20/20 2200  ceFEPIme (MAXIPIME) 1 g in sodium chloride 0.9 % 100 mL IVPB  Status:  Discontinued        1 g 200 mL/hr over 30 Minutes Intravenous Every 24 hours 08/20/20 1523 08/25/20 1618   08/13/20 2200  ceFEPIme (MAXIPIME) 2 g in sodium chloride 0.9 % 100 mL IVPB  Status:  Discontinued        2 g 200 mL/hr over 30 Minutes Intravenous Every 24 hours 08/13/20 1154 08/20/20 1523   08/09/20 2200  ceFEPIme (MAXIPIME) 1 g in sodium chloride 0.9 % 100 mL IVPB  Status:  Discontinued        1 g 200 mL/hr over 30 Minutes Intravenous Every 24 hours 08/09/20 1238 08/13/20 1154   08/09/20 0000  ceFEPIme (MAXIPIME) 1 g in sodium chloride 0.9 % 100 mL IVPB  Status:  Discontinued        1 g 200 mL/hr over 30 Minutes Intravenous Every 24 hours 08/08/20 2305 08/09/20 1238       Time spent: 25 minutes    Erin Hearing ANP  [facility] 7 am - 330 pm/M-F for direct patient care and secure chat Please refer to Amion for contact info 21  days

## 2020-08-29 NOTE — Consult Note (Signed)
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  Currently on 6 week course cefepime planned through 6/24. AKI requiring CRRT and HD: monitoring for renal recovery. IVDU: now on suboxone  PLAN:    Would continue with current cefepime as planned through 09/15/20 in the setting of AV endocarditis and aortic root abscess. Follow up in place with Dr West Bali on 6/28 after discharge   Principal Problem:   Endocarditis Active Problems:   Opioid use disorder   Acute renal failure with oliguria (HCC)   Cerebral embolism with cerebral infarction   Anemia   Acute kidney injury (AKI) with acute tubular necrosis (ATN) (HCC)   Atrial fibrillation with RVR (HCC)   Typical atrial flutter (HCC)   MEDICATIONS:    Scheduled Meds: . (feeding supplement) PROSource Plus  30 mL Oral BID BM  . buprenorphine-naloxone  1 tablet Sublingual BID  . Chlorhexidine Gluconate Cloth  6 each Topical Daily  . collagenase   Topical Daily  . darbepoetin (ARANESP) injection - NON-DIALYSIS  150 mcg Subcutaneous Q Sat-1800  . diltiazem  180 mg Oral Daily  . famotidine  20 mg Oral BID  . iron polysaccharides  150 mg Oral Daily  . lidocaine  1 patch Transdermal Q24H  . melatonin  3 mg Oral QHS  . methocarbamol  500 mg Oral TID  . nicotine  14 mg Transdermal Daily  . polyethylene glycol  17 g Oral Daily  . senna-docusate  1 tablet Oral BID  . warfarin  6 mg Oral q1600  . Warfarin - Pharmacist Dosing Inpatient   Does not apply q1600   Continuous Infusions: . ceFEPime (MAXIPIME) IV 2 g (08/29/20 0619)   PRN Meds:.acetaminophen **OR** acetaminophen, bisacodyl, clonazePAM, lactulose, ondansetron **OR** ondansetron (ZOFRAN) IV  HPI:    Alex [name] is a 40  y.o. male with history of IVDU and AV IE, aortic root abscess.  S/p Aortic valve replacement with a 23 mm On-X valve, Aortic root, reconstruction with bovine pericardiu, Closure of aortic sinus track with bovine pericardium on 5/12 Planning for 6 weeks of abx from that date with EOT 6/24 Currently on cefepime.  Reportedly was asking to leave but upon discussion with patient he reports he is more than willing to stay for the duration of IV therapy. He had previously left AMA 5/17 and returned within 24 hours Continues on cefepime Was previously on CRRT then HD.  Last HD was 5/30. Has tunneled line that may need removal Currently on suboxone Asked to see for updated abx recs.    Past Medical History:  Diagnosis Date  . Ruptured lumbar disc     Social History   Tobacco Use  . Smoking status: Current Every Day Smoker    Packs/day: 1.00    Types: Cigarettes  . Smokeless tobacco: Never Used  Vaping Use  . Vaping Use: Never used  Substance Use Topics  . Alcohol use: No  . Drug use: Yes    Types: Marijuana    Comment: denies use 08/24/17    History reviewed. No pertinent family history.  No Known Allergies  Review of Systems  Constitutional: Negative.   Respiratory: Negative.   Cardiovascular: Negative.   Gastrointestinal: Negative.   Skin:  Negative.   All other systems reviewed and are negative.   OBJECTIVE:   Blood pressure (!) 134/91, pulse 94, temperature 97.7 F (36.5 C), temperature source Oral, resp. rate 20, height '6\' 4"'$  (1.93 m), weight (!) 137.9 kg, SpO2 100 %. Body mass index is 37 kg/m.  Physical Exam Constitutional:      General: He is not in acute distress.    Appearance: Normal appearance.  HENT:     Head: Normocephalic and atraumatic.     Right Ear: External ear normal.     Left Ear: External ear normal.  Eyes:     Extraocular Movements: Extraocular movements intact.     Conjunctiva/sclera: Conjunctivae normal.  Pulmonary:     Effort: Pulmonary  effort is normal. No respiratory distress.  Musculoskeletal:        General: Normal range of motion.     Cervical back: Normal range of motion and neck supple.  Skin:    General: Skin is warm and dry.     Findings: No rash.  Neurological:     General: No focal deficit present.     Mental Status: He is alert and oriented to person, place, and time.  Psychiatric:        Mood and Affect: Mood normal.        Behavior: Behavior normal.      Lab Results: Lab Results  Component Value Date   WBC 5.6 08/28/2020   HGB 8.8 (L) 08/28/2020   HCT 29.3 (L) 08/28/2020   MCV 88.4 08/28/2020   PLT 217 08/28/2020    Lab Results  Component Value Date   NA 138 08/29/2020   K 4.0 08/29/2020   CO2 25 08/29/2020   GLUCOSE 84 08/29/2020   BUN 44 (H) 08/29/2020   CREATININE 4.19 (H) 08/29/2020   CALCIUM 8.5 (L) 08/29/2020   GFRNONAA 18 (L) 08/29/2020   GFRAA >90 03/10/2012    Lab Results  Component Value Date   ALT 9 08/20/2020   AST 16 08/20/2020   ALKPHOS 53 08/20/2020   BILITOT 0.5 08/20/2020    No results found for: CRP  No results found for: ESRSEDRATE  I have reviewed the micro and lab results in Epic.  Imaging: DG Foot Complete Right  Result Date: 08/28/2020 CLINICAL DATA:  Lower extremity edema. EXAM: RIGHT FOOT COMPLETE - 3+ VIEW COMPARISON:  None. FINDINGS: There is no evidence of fracture or dislocation. There is no evidence of arthropathy or other focal bone abnormality. Soft tissues are unremarkable. IMPRESSION: Negative. Electronically Signed   By: Marijo Conception M.D.   On: 08/28/2020 16:28     Imaging independently reviewed in Epic.  Raynelle Highland for Infectious Disease [facility] [phone] pager 08/29/2020, 10:58 AM

## 2020-08-29 NOTE — Progress Notes (Signed)
Patient ID: AVNER MARCHI, male   DOB: 04-17-1980, 40 y.o.   MRN: [id number] S: Feels well, no complaints.  O:BP 131/78 (BP Location: Right Arm)   Pulse 94   Temp 98.3 F (36.8 C) (Oral)   Resp 20   Ht '6\' 4"'$  (1.93 m)   Wt (!) 137.9 kg   SpO2 99%   BMI 37.00 kg/m   Intake/Output Summary (Last 24 hours) at 08/29/2020 1441 Last data filed at 08/29/2020 1412 Gross per 24 hour  Intake 752.65 ml  Output 3350 ml  Net -2597.35 ml   Intake/Output: I/O last 3 completed shifts: In: 2649.9 [P.O.:1514; Blood:330; IV Piggyback:805.9] Out: [id number] [Urine:4225]  Intake/Output this shift:  Total I/O In: -  Out: 1700 [Urine:1700] Weight change: -0.926 kg Gen: NAD CVS: RRR Resp: CTA Abd: +BS, soft, NT/ND Ext: 1+ bilateral lower extremity edema to mid thigh  Recent Labs  Lab 08/23/20 0155 08/24/20 0128 08/25/20 0203 08/26/20 0123 08/27/20 0119 08/28/20 0634 08/29/20 0125  NA 134* 135 136 136 138 139 138  K 3.9 3.7 3.7 3.6 3.9 4.2 4.0  CL 100 101 105 102 108 107 105  CO2 '24 26 25 25 24 25 25  '$ GLUCOSE 96 110* 85 108* 91 83 84  BUN 21* 30* 35* 38* 40* 41* 44*  CREATININE 3.25* 3.89* 4.19* 4.24* 4.25* 4.34* 4.19*  ALBUMIN 1.9* 1.9* 1.9* 2.0* 1.8* 1.9* 1.9*  CALCIUM 8.2* 8.2* 8.2* 8.4* 8.4* 8.4* 8.5*  PHOS 3.1 3.7 4.1 4.1 4.3 4.6 4.5   Liver Function Tests: Recent Labs  Lab 08/27/20 0119 08/28/20 0634 08/29/20 0125  ALBUMIN 1.8* 1.9* 1.9*   No results for input(s): LIPASE, AMYLASE in the last 168 hours. No results for input(s): AMMONIA in the last 168 hours. CBC: Recent Labs  Lab 08/24/20 0128 08/24/20 1016 08/25/20 0203 08/25/20 [id number] 08/26/20 0123 08/27/20 0119 08/28/20 0634 08/28/20 1709  WBC 8.1  --  7.5  --  6.6 6.9 5.6  --   HGB 6.8*   < > 6.5*   < > 7.6* 7.7* 6.7* 8.8*  HCT 22.0*   < > 20.9*   < > 24.5* 24.8* 22.1* 29.3*  MCV 89.1  --  86.7  --  86.9 87.9 88.4  --   PLT 198  --  197  --  220 229 217  --    < > = values in this interval not displayed.    Cardiac Enzymes: No results for input(s): CKTOTAL, CKMB, CKMBINDEX, TROPONINI in the last 168 hours. CBG: No results for input(s): GLUCAP in the last 168 hours.  Iron Studies:  Recent Labs    08/28/20 0931  IRON 19*  TIBC 251  FERRITIN 337*   Studies/Results: DG Foot Complete Right  Result Date: 08/28/2020 CLINICAL DATA:  Lower extremity edema. EXAM: RIGHT FOOT COMPLETE - 3+ VIEW COMPARISON:  None. FINDINGS: There is no evidence of fracture or dislocation. There is no evidence of arthropathy or other focal bone abnormality. Soft tissues are unremarkable. IMPRESSION: Negative. Electronically Signed   By: Marijo Conception M.D.   On: 08/28/2020 16:28   . (feeding supplement) PROSource Plus  30 mL Oral BID BM  . buprenorphine-naloxone  1 tablet Sublingual BID  . Chlorhexidine Gluconate Cloth  6 each Topical Daily  . collagenase   Topical Daily  . darbepoetin (ARANESP) injection - NON-DIALYSIS  150 mcg Subcutaneous Q Sat-1800  . diltiazem  180 mg Oral Daily  . famotidine  20 mg Oral BID  .  iron polysaccharides  150 mg Oral Daily  . lidocaine  1 patch Transdermal Q24H  . melatonin  3 mg Oral QHS  . methocarbamol  500 mg Oral TID  . nicotine  14 mg Transdermal Daily  . polyethylene glycol  17 g Oral Daily  . senna-docusate  1 tablet Oral BID  . warfarin  6 mg Oral q1600  . Warfarin - Pharmacist Dosing Inpatient   Does not apply q1600    BMET    Component Value Date/Time   NA 138 08/29/2020 0125   K 4.0 08/29/2020 0125   CL 105 08/29/2020 0125   CO2 25 08/29/2020 0125   GLUCOSE 84 08/29/2020 0125   BUN 44 (H) 08/29/2020 0125   CREATININE 4.19 (H) 08/29/2020 0125   CALCIUM 8.5 (L) 08/29/2020 0125   GFRNONAA 18 (L) 08/29/2020 0125   GFRAA >90 03/10/2012 1808   CBC    Component Value Date/Time   WBC 5.6 08/28/2020 0634   RBC 3.11 (L) 08/28/2020 0931   RBC 2.50 (L) 08/28/2020 0634   HGB 8.8 (L) 08/28/2020 1709   HCT 29.3 (L) 08/28/2020 1709   PLT 217 08/28/2020 0634    MCV 88.4 08/28/2020 0634   MCH 26.8 08/28/2020 0634   MCHC 30.3 08/28/2020 0634   RDW 14.9 08/28/2020 0634   LYMPHSABS 1.3 08/16/2020 0941   MONOABS 1.0 08/16/2020 0941   EOSABS 0.1 08/16/2020 0941   BASOSABS 0.0 08/16/2020 0941      Assessment/Plan:  1. AKI - presumably dense ATN related to SBE and hypotension.  Was started on CRRT 07/18/20 and transitioned to IHD on 5/16-5/30/22 with some increased UOP and stabilization of SCr.   1. Will consult IR to remove TDC. 2. Will sign off for now, please call with questions or concerns. 3. Follow up in our office with Dr. Royce Macadamia in 3-4 weeks 2. 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  Currently on IV cefepime via PICC. 3. Anemia of critical illness and ABLA following surgery.  On ESA and receiving transfusion today. 4. HTN - stable 5. A flutter with RVR - cardiology following.  6. Lower extremity edema - due to AKI as well as severe protein malnutrition with hypoalbuminemia.  Diuresing on his own.    Donetta Potts, MD [employer] [phone]

## 2020-08-29 NOTE — Procedures (Signed)
Pre procedural Dx: ESRD Post procedural Dx: Same  Successful removal of tunneled HD catheter. Catheter removed intact   EBL: None No immediate complications.  Please see imaging section of Epic for full dictation.  Jacqualine Mau NP 08/29/2020 3:18 PM

## 2020-08-30 LAB — PROTIME-INR
INR: 3.1 — ABNORMAL HIGH (ref 0.8–1.2)
Prothrombin Time: 31.6 seconds — ABNORMAL HIGH (ref 11.4–15.2)

## 2020-08-30 NOTE — Progress Notes (Signed)
Physical Therapy Treatment Patient Details Name: Alex [name] MRN: [id number] DOB: 1980-11-20 Today's Date: 08/30/2020    History of Present Illness 40 yo male smoker brought to [facility] ER 07/18/20 with altered mental status. Found to have hypotension, AKI, thrombocytopenia, splenomegaly, lactic acidosis, elevated bilirubin, hypoglycemia, polysubstance abuse. Treated for sepsis Intubated 4/28-5/5. 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 5/10 to IR for tunneled HD cath.  5/12 S/P aortic valve repair.  Extubated on CRRT 5/13. Pt to remain in hospital for remaining IV abx for a total of 6 weeks. PMH:  HTN, polysubstance abuse (nicotine, heroin, methamphetamine), and Opioid use disorder on suboxone.    PT Comments    Pt received walking into hallway from room, agreeable to therapy session and with good participation and tolerance for gait/stair training and seated/supine/standing HEP review (link: [email or web address] Access Code: [email or web address]). Pt scored 21/24 on DGI, indicating low fall risk, although due to R foot pain pt is at slightly higher fall risk from baseline. Pt needing Supervision at most for functional mobility tasks, mostly for activity pacing (tachycardia with gait up to 146 bpm) and fair carryover of instruction for sequencing/safety from previous session. Pt continues to benefit from PT services to progress toward functional mobility goals. Anticipate pt will clear acute PT in 1-2 more sessions, will need goal update by PT next session.  Follow Up Recommendations  No PT follow up     Equipment Recommendations  None recommended by PT    Recommendations for Other Services       Precautions / Restrictions Precautions Precautions: Fall Precaution  Comments: R foot pain Restrictions Weight Bearing Restrictions: No    Mobility  Bed Mobility               General bed mobility comments: received/remained EOB    Transfers Overall transfer level: Modified independent               General transfer comment: from EOB, needs to use BUE to achieve standing  Ambulation/Gait Ambulation/Gait assistance: Modified independent (Device/Increase time) Gait Distance (Feet): 470 Feet Assistive device: None Gait Pattern/deviations: Step-through pattern   Gait velocity interpretation: 1.31 - 2.62 ft/sec, indicative of limited community ambulator General Gait Details: stable gait with ability to attain age appropriate gait speed for a short period, limited by dyspnea/fatigue.  Pt able to maintain balance without overt deviation to moderate challenge including abrupt directional and speed changes, scanning, stepping over obstacles and negotiation of stairs.   Stairs Stairs: Yes Stairs assistance: Supervision Stair Management: Step to pattern;Forwards;One rail Right Number of Stairs: 10 General stair comments: pt did not need sequencing cues for painful leg each step this session, only needed one reminder; no buckling   Wheelchair Mobility    Modified Rankin (Stroke Patients Only)       Balance Overall balance assessment: Needs assistance Sitting-balance support: Feet supported Sitting balance-Leahy Scale: Good Sitting balance - Comments: donning socks, moving outside his BOS   Standing balance support: No upper extremity supported Standing balance-Leahy Scale: Fair Standing balance comment: no LOB, see DGI                 Standardized Balance Assessment Standardized Balance Assessment : Dynamic Gait Index   Dynamic Gait Index Level Surface: Mild Impairment Change in Gait Speed: Normal Gait with Horizontal Head Turns: Normal Gait  with Vertical Head Turns: Normal Gait and Pivot Turn: Normal Step Over  Obstacle: Normal Step Around Obstacles: Normal Steps: Moderate Impairment Total Score: 21      Cognition Arousal/Alertness: Awake/alert Behavior During Therapy: WFL for tasks assessed/performed Overall Cognitive Status: Within Functional Limits for tasks assessed                         Following Commands: Follows one step commands consistently;Follows multi-step commands consistently     Problem Solving: Requires verbal cues General Comments: vcs for stair sequencing, pt with fair recall from previous session ~7 days prior, reinforced sequencing with him/mother; otherwise cooperative and fair safety awareness      Exercises General Exercises - Lower Extremity Ankle Circles/Pumps: AROM;Both;10 reps Long Arc Quad: AROM;Both;10 reps Hip ABduction/ADduction: AROM;Both;5 reps;Seated (pillow squeeze) Straight Leg Raises:  (verbal review/demo for technique and HEP) Hip Flexion/Marching: AROM;Both;5 reps;Seated Mini-Sqauts:  (verbal review for technique) Other Exercises Other Exercises: reviewed posterior pelvic tilt and single leg bridge (due to R foot pain) for strengthening, HEP handout given    General Comments General comments (skin integrity, edema, etc.): HR elevated to 146 bpm with exertion, mostly 100-130 bpm, SpO2 95% on RA but DOE 2/4, needs cues for standing breaks when HR elevated; no sxs of elevated HR      Pertinent Vitals/Pain Pain Assessment: Faces Pain Score: 8  Faces Pain Scale: Hurts even more Pain Location: R foot (mostly in forefoot, heel less painful) Pain Descriptors / Indicators: Aching;Burning;Discomfort           PT Goals (current goals can now be found in the care plan section) Acute Rehab PT Goals Patient Stated Goal: To get stronger. PT Goal Formulation: With patient Time For Goal Achievement: 08/30/20 Potential to Achieve Goals: Good Progress towards PT goals: Progressing toward goals    Frequency    Min 1X/week      PT  Plan Current plan remains appropriate       AM-PAC PT "6 Clicks" Mobility   Outcome Measure  Help needed turning from your back to your side while in a flat bed without using bedrails?: None Help needed moving from lying on your back to sitting on the side of a flat bed without using bedrails?: None Help needed moving to and from a bed to a chair (including a wheelchair)?: None Help needed standing up from a chair using your arms (e.g., wheelchair or bedside chair)?: None Help needed to walk in hospital room?: None Help needed climbing 3-5 steps with a railing? : A Little 6 Click Score: 23    End of Session   Activity Tolerance: Patient tolerated treatment well Patient left: in bed;with call bell/phone within reach;with family/visitor present Nurse Communication: Mobility status;Other (comment) (tachy with exertion, R foot pain) PT Visit Diagnosis: Other abnormalities of gait and mobility (R26.89)     Time: Ken [id number] PT Time Calculation (min) (ACUTE ONLY): 22 min  Charges:  $Therapeutic Exercise: 8-22 mins                     Alex Crew P., PTA Acute Rehabilitation Services Pager: [phone] Office: [place] 08/30/2020, 5:01 PM

## 2020-08-30 NOTE — Progress Notes (Signed)
CSW spoke with Raquel Sarna at [facility] who states [employer] upheld the appeal for LTACH. Raquel Sarna states the expedited appeal was initiated yesterday afternoon.  Madilyn Fireman, MSW, LCSW Transitions of Care  Clinical Social Worker II [phone]

## 2020-08-30 NOTE — Progress Notes (Signed)
[facility] PROGRESS NOTE  RENZO DAUGHDRILL [id number] DOB: [birth year] DOA: 08/08/2020 PCP: [facility]  Status: Remains inpatient appropriate because:Unsafe d/c plan, IV treatments appropriate due to intensity of illness or inability to take PO and Inpatient level of care appropriate due to severity of illness. IVDA SO NOT AN APPROPRIATE CANDIDATE FOR HH ANTIBIOTICS VIA PICC LINE   Dispo: The patient is from: Home              Anticipated d/c is to: Home after rehabilitation.  Insurance declined LTAC level of care but given the need for Suboxone (which would exclude patient from going to an SNF) the physician at the [facility] is submitting a peer to peer on 6/7.  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  Discussed with ID and patient is not appropriate to transition over to oral antibiotics at this time.              Patient currently is not medically stable to d/c.   Difficult to place patient Yes   Level of care: Med-Surg  Code Status: Full Family Communication:  DVT prophylaxis: Heparin infusion COVID vaccination status: Received first dose of Pfizer on 08/16/2020  HPI: 40 year old male with history of polysubstance abuse, aortic valve endocarditis, Serratia bacteremia, septic emboli causing stroke and acute kidney injury requiring hemodialysis.  Patient left hospital AMA on 08/08/2020.  He came back same night after discussion with CT surgery.  Significant events:  4/26 transfer from [facility] to [facility]  CT head 4/26 >> no acute findings  CT abd/pelvis 4/26 >> cholelithiasis w/o inflammatory chagnes, mild hepatomegaly (25 cm), moderate splenomegaly (16.7 cm), 2 mm non obstructing kidney stone on Lt  4/27 CVL placed. Pressors escalating.   4/28 intubated for TEE, CVC, HD, and A-line placed. CRRT  started   Remains on CRRT  MRI on 4/30-multiple infarcts  5/4: repeat TEE shows an aortic valve abscess  5/5: extubated , self-removed HD cath so CRRT holiday> developed severe hyperkalemia overnight.  5/6: new temp HD cath placed   5/8: new temp HD cath placed after prior one self-removed by patient  5/9: Orthopantogram neg for abscess  5/10: HD cath replaced by IR [facility]  5/12 to OR for AVR, aortic root reconstruction, closure of aortic sinus track. To 2H ICU on vent post op. 4 units PRBC given intraop.   5/13 extubated, on CRRT, remains on pressors  5/18, left AMA.  5/30 continues on intermittent hemodialysis as directed by nephrology team.  Continues with excellent urinary output.  Creatinine between 2.9 and 4.5  Subjective: Ambulating in room.  Continues to complain of right foot pain.  Also continues to have asymmetrical edema right greater than left.  Objective: Vitals:   08/30/20 0507 08/30/20 0745  BP: (!) 141/82 132/76  Pulse: 92 96  Resp: 18 18  Temp: 98 F (36.7 C) 98.7 F (37.1 C)  SpO2: 98% 99%    Intake/Output Summary (Last 24 hours) at 08/30/2020 0823 Last data filed at 08/30/2020 0751 Gross per 24 hour  Intake 780 ml  Output 5000 ml  Net -4220 ml   Filed Weights   08/28/20 0405 08/29/20 0406 08/30/20 0507  Weight: (!) 138.8 kg (!) 137.9 kg 135 kg    Exam:  Constitutional: Calm, mild foot pain but otherwise no acute distress Respiratory: Posterior lung sounds are  clear to auscultation, stable on room air and no shortness of breath observed for patient ambulating in room, saturations 100% Cardiovascular: Normal heart sounds, bilateral lower extremity edema right greater than left, regular pulse Abdomen:  LBM 6/5, soft nontender nondistended with normoactive bowel sounds.  Eating well Genitourinary: Voids easily Neurologic: CN 2-12 grossly intact. Sensation intact, DTR normal. Strength 5/5 x all 4 extremities.  Psychiatric: X3 with pleasant  affect   Assessment/Plan: Acute problems: Aortic valve endocarditis with perivalvular abscess-Serratia bacteremia, septic emboli causing strokes.   -Presented with sepsis physiology on 4/26 eventually was transferred to ICU, was intubated.   -MRI brain on 07/22/2020 revealed multiple acute infarcts.   -Blood cultures positive for Serratia and Echocardiogram showed severe AI and AR with AV vegetation confirmed by TEE.   -Now s/p mechanical aortic valve replacement, aortic root construction and closure of aortic sinus tract.   -Needs 6 weeks of cefepime per ID with end date 09/15/20-REMAIN IP FOR IV ANTIBIOTICS SINCE NOT A CANDIDATE FOR OP THERAPIES 2/2 H/O IVDA  -Continue Coumadin per pharmacy.    -Patient left AMA on 08/07/2020 however came back to hospital after speaking with CT surgery on 08/08/2020.  Recurrent Anemia/thrombocytopenia -patient is s/p 3 units PRBC during this hospital stay.   -Transfuse PRBC as needed for hemoglobin less than 7.  -Hemolysis work-up negative, heme negative -continue Aranesp -Etiology likely secondary to renal dysfunction, persistent significant iron deficiency and overall chronic debility -Continue niferex -6/6 **iron level 19 with a sat of 18 and ferritin 337 given 1000 mg of IV iron -6/7 hemoglobin back up to 8.4 with plans to check hemoglobin again on 6/9  Atrial fibrillation/flutter with RVR -Evaluated by cardiology -Maintaining sinus rhythm on CCB and BB  -Continue warfarin -patient will need to follow-up with cardiology in [place] to establish at their Coumadin clinic -CHA2DS2-VASc equals 3  Acute kidney injury -likely from ATN from infection -Nephrology following  -Last HD 5/30. Per renal if Cr remains stable 6/7 plan is to consult IR for removal  Polysubstance abuse -patient has amphetamine abuse, nicotine dependence, opiate dependence on agonist therapy.   -He was on chronic Suboxone therapy at a clinic in [facility] prior to  admission.  Bilateral lower extremity edema -Continues to have persistent edema especially in right lower extremity and now associated with foot pain -During early portion of hospitalization patient had a lower extremity duplex that was negative-we will go ahead and repeat today -Continue ACE wraps (wrap in Kerlix before applying ACE due to prexisting skin irritation)  Obesity BMI-34.14kg/m    Data Reviewed: Basic Metabolic Panel: Recent Labs  Lab 08/24/20 0128 08/25/20 0203 08/26/20 0123 08/27/20 0119 08/28/20 0634 08/29/20 0125  NA 135 136 136 138 139 138  K 3.7 3.7 3.6 3.9 4.2 4.0  CL 101 105 102 108 107 105  CO2 '26 25 25 24 25 25  '$ GLUCOSE 110* 85 108* 91 83 84  BUN 30* 35* 38* 40* 41* 44*  CREATININE 3.89* 4.19* 4.24* 4.25* 4.34* 4.19*  CALCIUM 8.2* 8.2* 8.4* 8.4* 8.4* 8.5*  MG 1.9 1.9 2.0  --   --   --   PHOS 3.7 4.1 4.1 4.3 4.6 4.5   Liver Function Tests: Recent Labs  Lab 08/25/20 0203 08/26/20 0123 08/27/20 0119 08/28/20 0634 08/29/20 0125  ALBUMIN 1.9* 2.0* 1.8* 1.9* 1.9*     CBC: Recent Labs  Lab 08/24/20 0128 08/24/20 1016 08/25/20 0203 08/25/20 [id number] 08/26/20 0123 08/27/20 0119 08/28/20 [id number] 08/28/20  1709  WBC 8.1  --  7.5  --  6.6 6.9 5.6  --   HGB 6.8*   < > 6.5* 8.4* 7.6* 7.7* 6.7* 8.8*  HCT 22.0*   < > 20.9* 26.6* 24.5* 24.8* 22.1* 29.3*  MCV 89.1  --  86.7  --  86.9 87.9 88.4  --   PLT 198  --  197  --  220 229 217  --    < > = values in this interval not displayed.    BNP (last 3 results) Recent Labs    07/25/20 0334 07/26/20 0331  BNP 153.5* 76.0     Continuous Infusions: . ceFEPime (MAXIPIME) IV 2 g (08/30/20 0531)    Principal Problem:   Endocarditis Active Problems:   Opioid use disorder   Acute renal failure with oliguria (HCC)   Cerebral embolism with cerebral infarction   Anemia   Acute kidney injury (AKI) with acute tubular necrosis (ATN) (HCC)   Atrial fibrillation with RVR (HCC)   Typical atrial flutter  ([place])   Consultants:  Infectious disease  PCCM  Neurology  CT surgery  Procedures:  Echocardiogram  TEE  CRRT  Cortrack  Antibiotics: Anti-infectives (From admission, onward)   Start     Dose/Rate Route Frequency Ordered Stop   08/25/20 1800  ceFEPIme (MAXIPIME) 2 g in sodium chloride 0.9 % 100 mL IVPB        2 g 200 mL/hr over 30 Minutes Intravenous Every 12 hours 08/25/20 1618 09/15/20 2359   08/20/20 2200  ceFEPIme (MAXIPIME) 1 g in sodium chloride 0.9 % 100 mL IVPB  Status:  Discontinued        1 g 200 mL/hr over 30 Minutes Intravenous Every 24 hours 08/20/20 1523 08/25/20 1618   08/13/20 2200  ceFEPIme (MAXIPIME) 2 g in sodium chloride 0.9 % 100 mL IVPB  Status:  Discontinued        2 g 200 mL/hr over 30 Minutes Intravenous Every 24 hours 08/13/20 1154 08/20/20 1523   08/09/20 2200  ceFEPIme (MAXIPIME) 1 g in sodium chloride 0.9 % 100 mL IVPB  Status:  Discontinued        1 g 200 mL/hr over 30 Minutes Intravenous Every 24 hours 08/09/20 1238 08/13/20 1154   08/09/20 0000  ceFEPIme (MAXIPIME) 1 g in sodium chloride 0.9 % 100 mL IVPB  Status:  Discontinued        1 g 200 mL/hr over 30 Minutes Intravenous Every 24 hours 08/08/20 2305 08/09/20 1238       Time spent: 25 minutes    Erin Hearing ANP  [facility] 7 am - 330 pm/M-F for direct patient care and secure chat Please refer to Amion for contact info 22  days

## 2020-08-30 NOTE — Progress Notes (Signed)
ANTICOAGULATION CONSULT NOTE - Follow Up Consult  Pharmacy Consult for Warfarin Indication:  mechanical aortic valve  Labs: Recent Labs    08/28/20 0634 08/28/20 1709 08/29/20 0125 08/30/20 0141  HGB 6.7* 8.8*  --   --   HCT 22.1* 29.3*  --   --   PLT 217  --   --   --   LABPROT 27.7*  --  30.0* 31.6*  INR 2.6*  --  2.9* 3.1*  CREATININE 4.34*  --  4.19*  --    Assessment: 39yom with recent endocarditis> AV root repair and Mechanical AVR on 08/03/20. Warfarin started 5/13. Left AMA 5/16 pm then returned 5/17 pm. INR 2.6 on 5/18 and Warfarin resumed. Pharmacy was consulted to bridge warfarin with heparin until INR >2.5 again. Heparin stopped 6/1.  INR back in range at 2.9. He seems sensitive to dose changes. Hgb was 6.7 yesterday and received PRBC. INR seems to be stabilizing. We will cont with the same dose and change to less frequent monitoring if stable in the next few days.   hgb 6.7>>8.8 plt wnl  Goal of Therapy:  INR 2.5-3.5  per Dr. Kipp Brood Monitor platelets by anticoagulation protocol: Yes   Plan:  Coumadin '6mg'$  PO qday Monitor daily PT/INR Watch CBC and s/sx bleeding  Onnie Boer, PharmD, BCIDP, AAHIVP, CPP Infectious Disease Pharmacist 08/30/2020 10:33 AM

## 2020-08-30 NOTE — Progress Notes (Signed)
Nutrition Follow-up  DOCUMENTATION CODES:  Not applicable  INTERVENTION:  Recommend liberalizing diet to regular.  Continue ProSource Plus BID.  NUTRITION DIAGNOSIS:  Increased nutrient needs related to post-op healing as evidenced by estimated needs. - ongoing  GOAL:  Patient will meet greater than or equal to 90% of their needs - meeting consistently  MONITOR:  PO intake,Supplement acceptance,Weight trends,Labs,I & O's  REASON FOR ASSESSMENT:  Rounds    ASSESSMENT:  Patient with PMH significant for polysubstance use, ruptured lumbar disc, and recent admission for aortic valve endocarditis with perivalvular abscess (see time line below). Left AMA and presents back to ED with AKI and back/knee pain from mechanical fall. 4/28 - intubated, TEE, CRRT initiated 4/29 - Cortrak placed (gastric tip) 5/04 - TEE 5/05 - extubated to BiPAP, CRRT discontinued (pt pulled dialysis catheter) 5/06 - CRRT restarted due to severe hyperkalemia 5/10 - s/p tunneled HD catheter placement 5/12 - s/p AVR, aortic root reconstruction, closure of aortic sinus track 5/16 - transition to iHD 5/17 - left AMA 5/30 - last HF  Intake remains adequate. Last eight meal completions charted as 100%. Taking Prosource BID.   Admit wt: 131.5 kg Current wt: 135 kg  Recommend liberalizing diet to regular given pt is no longer on HD and electrolytes are normal. Also recommend continuing ProSource Plus BID.  Medications: reviewed; Pepcid BID, miralax, Senokot, Warfarin, cefepime via IV  Labs: reviewed; serum Ca 8.5  Diet Order:   Diet Order            Diet renal with fluid restriction Room service appropriate? Yes; Fluid consistency: Thin  Diet effective now                EDUCATION NEEDS:  Education needs have been addressed  Skin:  Skin Integrity Issues:: Other (Comment),Unstageable,Incisions Unstageable: coccyx, R buttocks Incisions: chest Other: MASD- perineum  Last BM:  08/27/20  Height:   Ht Readings from Last 1 Encounters:  08/08/20 '6\' 4"'$  (1.93 m)   Weight:  Wt Readings from Last 1 Encounters:  08/30/20 135 kg   Ideal Body Weight:  91.8 kg  BMI:  Body mass index is 36.23 kg/m.  Estimated Nutritional Needs:  Kcal:  2500-2700 kcal Protein:  120-140 grams Fluid:  1000 ml + UOP  Derrel Nip, RD, LDN Registered Dietitian I After-Hours/Weekend Pager # in [place]

## 2020-08-31 ENCOUNTER — Inpatient Hospital Stay (HOSPITAL_COMMUNITY): Payer: 59

## 2020-08-31 DIAGNOSIS — R600 Localized edema: Secondary | ICD-10-CM

## 2020-08-31 DIAGNOSIS — R609 Edema, unspecified: Secondary | ICD-10-CM

## 2020-08-31 LAB — PROTIME-INR
INR: 3.4 — ABNORMAL HIGH (ref 0.8–1.2)
Prothrombin Time: 34.3 seconds — ABNORMAL HIGH (ref 11.4–15.2)

## 2020-08-31 LAB — CBC
HCT: 25.8 % — ABNORMAL LOW (ref 39.0–52.0)
Hemoglobin: 8 g/dL — ABNORMAL LOW (ref 13.0–17.0)
MCH: 27.2 pg (ref 26.0–34.0)
MCHC: 31 g/dL (ref 30.0–36.0)
MCV: 87.8 fL (ref 80.0–100.0)
Platelets: 260 10*3/uL (ref 150–400)
RBC: 2.94 MIL/uL — ABNORMAL LOW (ref 4.22–5.81)
RDW: 14.9 % (ref 11.5–15.5)
WBC: 6.1 10*3/uL (ref 4.0–10.5)
nRBC: 0 % (ref 0.0–0.2)

## 2020-08-31 MED ORDER — WARFARIN SODIUM 5 MG PO TABS
5.0000 mg | ORAL_TABLET | Freq: Every day | ORAL | Status: DC
  Administered 2020-08-31 – 2020-09-02 (×3): 5 mg via ORAL
  Filled 2020-08-31 (×3): qty 1

## 2020-08-31 NOTE — Progress Notes (Signed)
Bilateral lower extremity venous duplex has been completed. Preliminary results can be found in CV Proc through chart review.   08/31/20 1:45 PM Carlos Levering RVT

## 2020-08-31 NOTE — TOC Progression Note (Signed)
Transition of Care [facility]) - Progression Note    Patient Details  Name: Alex [name] MRN: [id number] Date of Birth: 1981-01-09  Transition of Care [facility]) CM/SW [place], RN Phone Number: 08/31/2020, 8:18 AM  Clinical Narrative:    Case management sent a message to [name] to follow up regarding insurance authorization for LTAC at [facility] facility.  CM and MSW will continue to follow for disposition needs.   Expected Discharge Plan: Long Term Acute Care (LTAC) Barriers to Discharge: Continued Medical Work up  Expected Discharge Plan and Services Expected Discharge Plan: [place] (LTAC)   Discharge Planning Services: CM Consult Post Acute Care Choice: Long Term Acute Care (LTAC) Living arrangements for the past 2 months: Single Family Home                                       Social Determinants of Health (SDOH) Interventions    Readmission Risk Interventions Readmission Risk Prevention Plan 08/25/2020  Medication Review (RN Care Manager) Complete  SW Recovery Care/Counseling Consult Complete  Skilled Nursing Facility Complete  Some recent data might be hidden

## 2020-08-31 NOTE — Progress Notes (Addendum)
[facility] PROGRESS NOTE  HERSEY BROCKUS [id number] DOB: [birth year] DOA: 08/08/2020 PCP: [facility]  Status: Remains inpatient appropriate because:Unsafe d/c plan, IV treatments appropriate due to intensity of illness or inability to take PO and Inpatient level of care appropriate due to severity of illness. IVDA SO NOT AN APPROPRIATE CANDIDATE FOR HH ANTIBIOTICS VIA PICC LINE   Dispo: The patient is from: Home              Anticipated d/c is to: Home after rehabilitation.  Insurance declined LTAC level of care but given the need for Suboxone (which would exclude patient from going to an SNF) the physician at the [facility] is submitting a peer to peer on 6/7.  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  Discussed with ID and patient is not appropriate to transition over to oral antibiotics at this time.              Patient currently is not medically stable to d/c.   Difficult to place patient Yes   Level of care: Med-Surg  Code Status: Full Family Communication:  DVT prophylaxis: Heparin infusion COVID vaccination status: Received first dose of Pfizer on 08/16/2020  HPI: 40 year old male with history of polysubstance abuse, aortic valve endocarditis, Serratia bacteremia, septic emboli causing stroke and acute kidney injury requiring hemodialysis.  Patient left hospital AMA on 08/08/2020.  He came back same night after discussion with CT surgery.  Significant events:  4/26 transfer from [facility] to [facility] CT head 4/26 >> no acute findings CT abd/pelvis 4/26 >> cholelithiasis w/o inflammatory chagnes, mild hepatomegaly (25 cm), moderate splenomegaly (16.7 cm), 2 mm non obstructing kidney stone on Lt 4/27 CVL placed. Pressors escalating.  4/28 intubated for TEE, CVC, HD, and A-line placed. CRRT started   Remains on CRRT MRI on 4/30-multiple infarcts 5/4: repeat TEE shows an aortic valve abscess 5/5: extubated , self-removed HD cath so CRRT holiday> developed severe hyperkalemia overnight. 5/6: new temp HD cath placed  5/8: new temp HD cath placed after prior one self-removed by patient 5/9: Orthopantogram neg for abscess 5/10: HD cath replaced by IR [facility] 5/12 to OR for AVR, aortic root reconstruction, closure of aortic sinus track. To 2H ICU on vent post op.  4 units PRBC given intraop.  5/13 extubated, on CRRT, remains on pressors 5/18, left AMA. 5/30 continues on intermittent hemodialysis as directed by nephrology team.  Continues with excellent urinary output.  Creatinine between 2.9 and 4.5  Subjective: Alert and ambulatory.  Primary complaints are related to recurrent edema especially on the right lower extremity.  Eats he is voiding well.  No shortness of breath or chest pain.  Objective: Vitals:   08/31/20 0510 08/31/20 0803  BP: 133/85 128/83  Pulse: 85 88  Resp: 18 18  Temp: 98.2 F (36.8 C) 98.7 F (37.1 C)  SpO2: 96% 96%    Intake/Output Summary (Last 24 hours) at 08/31/2020 0817 Last data filed at 08/31/2020 0805 Gross per 24 hour  Intake 1680 ml  Output 3800 ml  Net -2120 ml   Filed Weights   08/29/20 0406 08/30/20 0507 08/31/20 0500  Weight: (!) 137.9 kg 135 kg 134 kg    Exam:  Constitutional:, Calm, no acute distress Respiratory: Clear, stable on room air. Cardiovascular: S1-S2, no JVD, persistent bilateral lower extremity peripheral edema right greater  than left, regular pulse  Abdomen:  LBM 6/5, soft nontender nondistended with normoactive bowel sounds and eating well. Genitourinary: Voids easily Neurologic: CN 2-12 grossly intact. Sensation intact, DTR normal. Strength 5/5 x all 4 extremities.  Psychiatric: Alert and oriented x3.  Pleasant affect.   Assessment/Plan: Acute problems: Aortic valve endocarditis with perivalvular abscess-Serratia  bacteremia, septic emboli causing strokes.   -Presented with sepsis physiology on 4/26 eventually was transferred to ICU, was intubated.   -MRI brain on 07/22/2020 revealed multiple acute infarcts.   -Blood cultures positive for Serratia and Echocardiogram showed severe AI and AR with AV vegetation confirmed by TEE.   -Now s/p mechanical aortic valve replacement, aortic root construction and closure of aortic sinus tract.   -Needs 6 weeks of cefepime per ID with end date 09/15/20  -Continue Coumadin per pharmacy.    -Patient left AMA on 08/07/2020 however came back to hospital after speaking with CT surgery on 08/08/2020.  Recurrent Anemia/thrombocytopenia -patient is s/p 3 units PRBC during this hospital stay.   -Transfuse PRBC as needed for hemoglobin less than 7.  -Hemolysis work-up negative, heme negative -continue Aranesp -Etiology likely secondary to renal dysfunction, persistent significant iron deficiency and overall chronic debility -Continue niferex -6/6 **iron level 19 with a sat of 18 and ferritin 337 given 1000 mg of IV iro - hemoglobin increased to 8.8 after most recent unit of PRBCs and as of 6/9 was 8.0  Atrial fibrillation/flutter with RVR -Evaluated by cardiology -Maintaining sinus rhythm on CCB and BB  -Continue warfarin -patient will need to [place] establish at [place] Coumadin clinic -CHA2DS2-VASc equals 3  Acute kidney injury -likely from ATN from infection -Nephrology following  -Last HD 5/30.  -Creatinine slowly trending downwards.  Dialysis catheter has been removed and nephrology has signed off  Polysubstance abuse -patient has amphetamine abuse, nicotine dependence, opiate dependence on agonist therapy.   -He was on chronic Suboxone therapy at a clinic in [facility] prior to admission.  Bilateral lower extremity edema -Continues to have persistent edema especially in right lower extremity and now associated with  foot pain -6/9 LE duplex neg for DVT -Continue ACE wraps (wrap in Kerlix before applying ACE due to prexisting skin irritation) and elevation.  Discussed with nephrology about possible intermittent use of IV Lasix but he recommends at this point continuing conservative therapy as above.  Obesity BMI-34.14kg/m    Data Reviewed: Basic Metabolic Panel: Recent Labs  Lab 08/25/20 0203 08/26/20 0123 08/27/20 0119 08/28/20 0634 08/29/20 0125  NA 136 136 138 139 138  K 3.7 3.6 3.9 4.2 4.0  CL 105 102 108 107 105  CO2 '25 25 24 25 25  '$ GLUCOSE 85 108* 91 83 84  BUN 35* 38* 40* 41* 44*  CREATININE 4.19* 4.24* 4.25* 4.34* 4.19*  CALCIUM 8.2* 8.4* 8.4* 8.4* 8.5*  MG 1.9 2.0  --   --   --   PHOS 4.1 4.1 4.3 4.6 4.5   Liver Function Tests: Recent Labs  Lab 08/25/20 0203 08/26/20 0123 08/27/20 0119 08/28/20 0634 08/29/20 0125  ALBUMIN 1.9* 2.0* 1.8* 1.9* 1.9*     CBC: Recent Labs  Lab 08/25/20 0203 08/25/20 [id number] 08/26/20 0123 08/27/20 0119 08/28/20 0634 08/28/20 1709 08/31/20 0433  WBC 7.5  --  6.6 6.9 5.6  --  6.1  HGB 6.5*   < > 7.6* 7.7* 6.7* 8.8* 8.0*  HCT 20.9*   < > 24.5* 24.8* 22.1* 29.3* 25.8*  MCV 86.7  --  86.9 87.9 88.4  --  87.8  PLT 197  --  220 229 217  --  260   < > = values in this interval not displayed.    BNP (last 3 results) Recent Labs    07/25/20 0334 07/26/20 0331  BNP 153.5* 76.0     Continuous Infusions:  ceFEPime (MAXIPIME) IV 2 g (08/31/20 0513)    Principal Problem:   Endocarditis Active Problems:   Opioid use disorder   Acute renal failure with oliguria (HCC)   Cerebral embolism with cerebral infarction   Anemia   Acute kidney injury (AKI) with acute tubular necrosis (ATN) (HCC)   Atrial fibrillation with RVR (HCC)   Typical atrial flutter ([place])   Consultants: Infectious disease PCCM Neurology CT surgery  Procedures: Echocardiogram TEE CRRT Cortrack  Antibiotics: Anti-infectives (From admission, onward)     Start     Dose/Rate Route Frequency Ordered Stop   08/25/20 1800  ceFEPIme (MAXIPIME) 2 g in sodium chloride 0.9 % 100 mL IVPB        2 g 200 mL/hr over 30 Minutes Intravenous Every 12 hours 08/25/20 1618 09/15/20 2359   08/20/20 2200  ceFEPIme (MAXIPIME) 1 g in sodium chloride 0.9 % 100 mL IVPB  Status:  Discontinued        1 g 200 mL/hr over 30 Minutes Intravenous Every 24 hours 08/20/20 1523 08/25/20 1618   08/13/20 2200  ceFEPIme (MAXIPIME) 2 g in sodium chloride 0.9 % 100 mL IVPB  Status:  Discontinued        2 g 200 mL/hr over 30 Minutes Intravenous Every 24 hours 08/13/20 1154 08/20/20 1523   08/09/20 2200  ceFEPIme (MAXIPIME) 1 g in sodium chloride 0.9 % 100 mL IVPB  Status:  Discontinued        1 g 200 mL/hr over 30 Minutes Intravenous Every 24 hours 08/09/20 1238 08/13/20 1154   08/09/20 0000  ceFEPIme (MAXIPIME) 1 g in sodium chloride 0.9 % 100 mL IVPB  Status:  Discontinued        1 g 200 mL/hr over 30 Minutes Intravenous Every 24 hours 08/08/20 2305 08/09/20 1238        Time spent: 25 minutes    Erin Hearing ANP  [facility] 7 am - 330 pm/M-F for direct patient care and secure chat Please refer to Amion for contact info 23  days

## 2020-08-31 NOTE — Progress Notes (Signed)
ANTICOAGULATION CONSULT NOTE - Follow Up Consult  Pharmacy Consult for Warfarin Indication:  mechanical aortic valve  No Known Allergies  Patient Measurements: Height: '6\' 4"'$  (193 cm) Weight: 134 kg (295 lb 6.7 oz) IBW/kg (Calculated) : 86.8  Vital Signs: Temp: 98.7 F (37.1 C) (06/09 0803) Temp Source: Oral (06/09 0803) BP: 128/83 (06/09 0803) Pulse Rate: 88 (06/09 0803)  Labs: Recent Labs    08/28/20 1709 08/29/20 0125 08/30/20 0141 08/31/20 0433  HGB 8.8*  --   --  8.0*  HCT 29.3*  --   --  25.8*  PLT  --   --   --  260  LABPROT  --  30.0* 31.6* 34.3*  INR  --  2.9* 3.1* 3.4*  CREATININE  --  4.19*  --   --     Estimated Creatinine Clearance: 36.1 mL/min (A) (by C-G formula based on SCr of 4.19 mg/dL (H)).  Assessment: 39yom with recent endocarditis> AV root repair and Mechanical AVR on 08/03/20. Warfarin started 5/13. Left AMA 5/16 pm then returned 5/17 pm. INR 2.6 on 5/18 and Warfarin resumed. Pharmacy was consulted 5/27 (INR 1.8) to bridge with heparin until INR >2.5 again. Heparin stopped 6/1 when INR 3.1.     INR has trended up to 3.4 on Warfarin 6 mg daily since 6/6.  Warfarin held x 1 on 6/3 d/t Hgb 6.5. FOB negative, Pepcid added. Transfused 6/2, 6/3 and 6/6. Hgb 8 today. No bleeding reported.  Weekly Aranesp 150 mcg SQ weekly on Saturday due 6/11. IV iron given 6/6 for tsat 8; on Niferex daily.   Goal of Therapy:  INR 2.5-3.5  per Dr. Kipp Brood Monitor platelets by anticoagulation protocol: Yes   Plan:   Decrease Warfarin from 6 to 5 mg daily.  Daily PT/INR for now.  CBC q72hrs.  Monitor for s/sx bleeding.  Arty Baumgartner, [place] 08/31/2020,10:09 AM

## 2020-09-01 DIAGNOSIS — Z952 Presence of prosthetic heart valve: Secondary | ICD-10-CM

## 2020-09-01 LAB — BASIC METABOLIC PANEL
Anion gap: 9 (ref 5–15)
BUN: 41 mg/dL — ABNORMAL HIGH (ref 6–20)
CO2: 23 mmol/L (ref 22–32)
Calcium: 8.9 mg/dL (ref 8.9–10.3)
Chloride: 107 mmol/L (ref 98–111)
Creatinine, Ser: 3.93 mg/dL — ABNORMAL HIGH (ref 0.61–1.24)
GFR, Estimated: 19 mL/min — ABNORMAL LOW (ref >60)
Glucose, Bld: 83 mg/dL (ref 70–99)
Potassium: 4.1 mmol/L (ref 3.5–5.1)
Sodium: 139 mmol/L (ref 135–145)

## 2020-09-01 LAB — PROTIME-INR
INR: 3.2 — ABNORMAL HIGH (ref 0.8–1.2)
Prothrombin Time: 32.5 seconds — ABNORMAL HIGH (ref 11.4–15.2)

## 2020-09-01 NOTE — Progress Notes (Signed)
ANTICOAGULATION CONSULT NOTE - Follow Up Consult  Pharmacy Consult for Warfarin  Indication:  mechanical aortic valve    No Known Allergies  Patient Measurements: Height: '6\' 4"'$  (193 cm) Weight: (!) 138.2 kg (304 lb 10.8 oz) IBW/kg (Calculated) : 86.8  Vital Signs: Temp: 97.9 F (36.6 C) (06/10 0827) Temp Source: Oral (06/10 0827) BP: 134/93 (06/10 0827) Pulse Rate: 94 (06/10 0827)  Labs: Recent Labs    08/30/20 0141 08/31/20 0433 09/01/20 0348  HGB  --  8.0*  --   HCT  --  25.8*  --   PLT  --  260  --   LABPROT 31.6* 34.3* 32.5*  INR 3.1* 3.4* 3.2*  CREATININE  --   --  3.93*    Estimated Creatinine Clearance: 39.1 mL/min (A) (by C-G formula based on SCr of 3.93 mg/dL (H)).  Assessment: 39yom with recent endocarditis> AV root repair and Mechanical AVR on 08/03/20. Warfarin started 5/13. Left AMA 5/16 pm then returned 5/17 pm. INR 2.6 on 5/18 and Warfarin resumed. Pharmacy was consulted 5/27 (INR 1.8) to bridge with heparin until INR >2.5 again. Heparin stopped 6/1 when INR 3.1.     INR trended up to 3.4 on 6/9 on Warfarin 6 mg daily. Reduced to 5 mg daily > INR 3.2 today.   Warfarin held x 1 on 6/3 d/t Hgb 6.5. FOB negative, Pepcid added. Transfused 6/2, 6/3 and 6/6. Hgb 8 today. No bleeding reported.  Weekly Aranesp 150 mcg SQ weekly on Saturday due 6/11. IV iron given 6/6 for tsat 8; on Niferex daily.   Goal of Therapy:  INR 2.5-3.5  per Dr. Kipp Brood Monitor platelets by anticoagulation protocol: Yes   Plan:   Continue Warfarin 5 mg daily.  Daily PT/INR for now.  CBC and bmet at least q72hrs.  Monitor for s/sx bleeding.  Arty Baumgartner, RPh 09/01/2020,10:48 AM

## 2020-09-01 NOTE — Progress Notes (Signed)
[facility] PROGRESS NOTE  Alex [name] [id number] DOB: 04-30-1980 DOA: 08/08/2020 [facility]CP: [facility]  Status: Remains inpatient appropriate because:Unsafe d/c plan, IV treatments appropriate due to intensity of illness or inability to take PO and Inpatient level of care appropriate due to severity of illness. IVDA SO NOT AN APPROPRIATE CANDIDATE FOR HH ANTIBIOTICS VIA PICC LINE   Dispo: The patient is from: Home              Anticipated d/c is to: Home after rehabilitation.  Insurance declined LTAC level of care but given the need for Suboxone (which would exclude patient from going to an SNF) the physician at the [facility] is submitting a peer to peer on 6/7.  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  Discussed with ID and patient is not appropriate to transition over to oral antibiotics at this time.              Patient currently is not medically stable to d/c.   Difficult to place patient Yes   Level of care: Med-Surg  Code Status: Full Family Communication:  DVT prophylaxis: Heparin infusion COVID vaccination status: Received first dose of Pfizer on 08/16/2020  HPI: 40 year old male with history of polysubstance abuse, aortic valve endocarditis, Serratia bacteremia, septic emboli causing stroke and acute kidney injury requiring hemodialysis.  Patient left hospital AMA on 08/08/2020.  He came back same night after discussion with CT surgery.  Significant events:  4/26 transfer from [facility] to [facility] CT head 4/26 >> no acute findings CT abd/pelvis 4/26 >> cholelithiasis w/o inflammatory chagnes, mild hepatomegaly (25 cm), moderate splenomegaly (16.7 cm), 2 mm non obstructing kidney stone on Lt 4/27 CVL placed. Pressors escalating.  4/28 intubated for TEE, CVC, HD, and A-line placed. CRRT started   Remains on CRRT MRI on 4/30-multiple infarcts 5/4: repeat TEE shows an aortic valve abscess 5/5: extubated , self-removed HD cath so CRRT holiday> developed severe hyperkalemia overnight. 5/6: new temp HD cath placed  5/8: new temp HD cath placed after prior one self-removed by patient 5/9: Orthopantogram neg for abscess 5/10: HD cath replaced by IR [facility] 5/12 to OR for AVR, aortic root reconstruction, closure of aortic sinus track. To 2H ICU on vent post op.  4 units PRBC given intraop.  5/13 extubated, on CRRT, remains on pressors 5/18, left AMA. 5/30 continues on intermittent hemodialysis as directed by nephrology team.  Continues with excellent urinary output.  Creatinine between 2.9 and 4.5  Subjective: Alert and ambulatory.  No complaints.  Discussed timing of discharge and expectations.  Patient once again states that given everything that he has been through that he definitely does not wish to use drugs again and remain enrolled in the Suboxone clinic after discharge.  Objective: Vitals:   08/31/20 2315 09/01/20 0535  BP: (!) 135/93 131/78  Pulse: 91 78  Resp: 16 16  Temp: 98.3 F (36.8 C) 98 F (36.7 C)  SpO2: 98% 98%    Intake/Output Summary (Last 24 hours) at 09/01/2020 0753 Last data filed at 09/01/2020 0528 Gross per 24 hour  Intake 920 ml  Output 3870 ml  Net -2950 ml   Filed Weights   08/30/20 0507 08/31/20 0500 09/01/20 0500  Weight: 135 kg 134 kg (!) 138.2 kg    Exam:  Constitutional: Alert and calm and appears to be  comfortable Respiratory: Lungs are clear on posterior exam, stable on room air Cardiovascular: Normal heart sounds, pulses regular, continues with bilateral lower extremity edema with right markedly worse than left Abdomen:  LBM 6/5, nontender nondistended, normoactive bowel sounds Genitourinary: Voids easily Neurologic: CN 2-12 grossly intact. Sensation intact, DTR normal. Strength 5/5 x all 4 extremities.  Psychiatric: Alert and  oriented x3.  Pleasant affect   Assessment/Plan: Acute problems: Aortic valve endocarditis with perivalvular abscess-Serratia bacteremia, septic emboli causing strokes.   -Presented with sepsis physiology required short-term mechanical ventilation   -MRI brain (07/22/2020): multiple acute infarcts.   -Blood cultures: Serratia marcescens -Echocardiogram: severe AI and AR with AV vegetation confirmed by TEE.   -S/p mechanical aortic valve replacement, aortic root construction and closure of aortic sinus tract.   -Needs 6 weeks of cefepime per ID with end date 09/15/20  -Coumadin per pharmacy.  Will need to establish with cardiology in [place] re: Coumadin clinic-ambulatory referral has been sent -Patient left AMA on 08/07/2020 however came back to hospital after speaking with CT surgery on 08/08/2020. -We will need to follow-up with Dr. Kipp Brood after discharge for routine postoperative visit  Status post recent mechanical aortic valve replacement -Continue warfarin  Recurrent Anemia/thrombocytopenia -patient is s/p 3 units PRBC during this hospital stay.   -Transfuse PRBC as needed for hemoglobin less than 7.  -Hemolysis work-up negative, and stools have been heme negative -continue Aranesp while hospitalized -Etiology likely secondary to renal dysfunction, persistent significant iron deficiency and overall chronic debility -Continue niferex -6/6 **iron level 19 with a sat of 18 and ferritin 337 given 1000 mg of IV iro - hemoglobin increased to 8.8 after most recent unit of PRBCs and as of 6/9 was 8.0  Atrial fibrillation/flutter with RVR -Evaluated by cardiology -Maintaining sinus rhythm on CCB and BB  -Continue warfarin -patient will need to follow-up with cardiology in [place] to establish at their Coumadin clinic -CHA2DS2-VASc equals 3  Acute kidney injury -likely from ATN 2/2 acute infection and sepsis physiology -Last HD 5/30 and tunneled dialysis catheter has subsequently  been removed, nephrology signed off -Creatinine continues to trend down and as of 6/10 is 3.93  Polysubstance abuse -patient has amphetamine abuse, nicotine dependence, opiate dependence on agonist therapy.   -He was on chronic Suboxone therapy at a clinic in [facility] prior to admission. -He told this Probation officer that he will no longer utilize drugs and that most recent circumstances of this admission has convinced him that the risks are too great to continue using IV drugs  Bilateral lower extremity edema -Continues to have persistent edema especially in right lower extremity and now associated with foot pain -6/9 LE duplex neg for DVT -Continue ACE wraps (wrap in Kerlix before applying ACE due to prexisting skin irritation) and elevation.  Discussed with nephrology about possible intermittent use of IV Lasix but he recommends at this point continuing conservative therapy as above.  Obesity BMI-34.14kg/m    Data Reviewed: Basic Metabolic Panel: Recent Labs  Lab 08/26/20 0123 08/27/20 0119 08/28/20 0634 08/29/20 0125 09/01/20 0348  NA 136 138 139 138 139  K 3.6 3.9 4.2 4.0 4.1  CL 102 108 107 105 107  CO2 '25 24 25 25 23  '$ GLUCOSE 108* 91 83 84 83  BUN 38* 40* 41* 44* 41*  CREATININE 4.24* 4.25* 4.34* 4.19* 3.93*  CALCIUM 8.4* 8.4* 8.4* 8.5* 8.9  MG 2.0  --   --   --   --  PHOS 4.1 4.3 4.6 4.5  --    Liver Function Tests: Recent Labs  Lab 08/26/20 0123 08/27/20 0119 08/28/20 0634 08/29/20 0125  ALBUMIN 2.0* 1.8* 1.9* 1.9*     CBC: Recent Labs  Lab 08/26/20 0123 08/27/20 0119 08/28/20 0634 08/28/20 1709 08/31/20 0433  WBC 6.6 6.9 5.6  --  6.1  HGB 7.6* 7.7* 6.7* 8.8* 8.0*  HCT 24.5* 24.8* 22.1* 29.3* 25.8*  MCV 86.9 87.9 88.4  --  87.8  PLT 220 229 217  --  260    BNP (last 3 results) Recent Labs    07/25/20 0334 07/26/20 0331  BNP 153.5* 76.0     Continuous Infusions:  ceFEPime (MAXIPIME) IV 2 g (09/01/20 0537)    Principal  Problem:   Endocarditis Active Problems:   Opioid use disorder   Acute renal failure with oliguria (HCC)   Cerebral embolism with cerebral infarction   Anemia   Acute kidney injury (AKI) with acute tubular necrosis (ATN) (HCC)   Atrial fibrillation with RVR (HCC)   Typical atrial flutter (HCC)   Bilateral lower extremity edema   Consultants: Infectious disease PCCM Neurology CT surgery  Procedures: Echocardiogram TEE CRRT Cortrack  Antibiotics: Anti-infectives (From admission, onward)    Start     Dose/Rate Route Frequency Ordered Stop   08/25/20 1800  ceFEPIme (MAXIPIME) 2 g in sodium chloride 0.9 % 100 mL IVPB        2 g 200 mL/hr over 30 Minutes Intravenous Every 12 hours 08/25/20 1618 09/15/20 2359   08/20/20 2200  ceFEPIme (MAXIPIME) 1 g in sodium chloride 0.9 % 100 mL IVPB  Status:  Discontinued        1 g 200 mL/hr over 30 Minutes Intravenous Every 24 hours 08/20/20 1523 08/25/20 1618   08/13/20 2200  ceFEPIme (MAXIPIME) 2 g in sodium chloride 0.9 % 100 mL IVPB  Status:  Discontinued        2 g 200 mL/hr over 30 Minutes Intravenous Every 24 hours 08/13/20 1154 08/20/20 1523   08/09/20 2200  ceFEPIme (MAXIPIME) 1 g in sodium chloride 0.9 % 100 mL IVPB  Status:  Discontinued        1 g 200 mL/hr over 30 Minutes Intravenous Every 24 hours 08/09/20 1238 08/13/20 1154   08/09/20 0000  ceFEPIme (MAXIPIME) 1 g in sodium chloride 0.9 % 100 mL IVPB  Status:  Discontinued        1 g 200 mL/hr over 30 Minutes Intravenous Every 24 hours 08/08/20 2305 08/09/20 1238        Time spent: 25 minutes    Erin Hearing ANP  [facility] 7 am - 330 pm/M-F for direct patient care and secure chat Please refer to Amion for contact info 24  days

## 2020-09-01 NOTE — Progress Notes (Signed)
Bilateral compression wraps to lower extremities

## 2020-09-02 DIAGNOSIS — Z952 Presence of prosthetic heart valve: Secondary | ICD-10-CM

## 2020-09-02 LAB — PROTIME-INR
INR: 2.9 — ABNORMAL HIGH (ref 0.8–1.2)
Prothrombin Time: 29.9 seconds — ABNORMAL HIGH (ref 11.4–15.2)

## 2020-09-02 MED ORDER — LEVOFLOXACIN 750 MG PO TABS
750.0000 mg | ORAL_TABLET | Freq: Once | ORAL | Status: AC
  Administered 2020-09-02: 750 mg via ORAL
  Filled 2020-09-02: qty 1

## 2020-09-02 MED ORDER — FUROSEMIDE 20 MG PO TABS
20.0000 mg | ORAL_TABLET | Freq: Two times a day (BID) | ORAL | Status: DC
  Administered 2020-09-02 – 2020-09-06 (×8): 20 mg via ORAL
  Filled 2020-09-02 (×8): qty 1

## 2020-09-02 MED ORDER — LEVOFLOXACIN 750 MG PO TABS
750.0000 mg | ORAL_TABLET | ORAL | Status: DC
  Administered 2020-09-03: 750 mg via ORAL
  Filled 2020-09-02: qty 1

## 2020-09-02 NOTE — Progress Notes (Signed)
Spoke with Dr. Tommy Medal with infectious disease regarding loss of access.  Talked about possible midline which he was not comfortable with considering patients history.  Since we are unable to find any access for him for a PIV and him having recurrent infiltrations and phlebitis he decided to transition him to Levaquin 750 PO.  Notified Dr. [place] of this change and will continue as planned.  At this time pt will not have any IV access.

## 2020-09-02 NOTE — Progress Notes (Signed)
ANTICOAGULATION CONSULT NOTE - Follow Up Consult  Pharmacy Consult for Warfarin  Indication:  mechanical aortic valve    No Known Allergies  Patient Measurements: Height: '6\' 4"'$  (193 cm) Weight: 132.5 kg (292 lb) IBW/kg (Calculated) : 86.8  Vital Signs: Temp: 98.4 F (36.9 C) (06/11 0436) Temp Source: Oral (06/11 0436) BP: 126/90 (06/11 0436) Pulse Rate: 94 (06/11 0436)  Labs: Recent Labs    08/31/20 0433 09/01/20 0348 09/02/20 0036  HGB 8.0*  --   --   HCT 25.8*  --   --   PLT 260  --   --   LABPROT 34.3* 32.5* 29.9*  INR 3.4* 3.2* 2.9*  CREATININE  --  3.93*  --     Estimated Creatinine Clearance: 38.3 mL/min (A) (by C-G formula based on SCr of 3.93 mg/dL (H)).  Assessment: 39yom with recent endocarditis> AV root repair and Mechanical AVR on 08/03/20. Warfarin started 5/13. Left AMA 5/16 pm then returned 5/17 pm. INR 2.6 on 5/18 and Warfarin resumed. Pharmacy was consulted 5/27 (INR 1.8) to bridge with heparin until INR >2.5 again. Heparin stopped 6/1 when INR 3.1.    INR continues to be therapeutic at 2.9 on warfarin 5 mg daily.  Weekly Aranesp 150 mcg SQ weekly on Saturday due 6/11. IV iron given 6/6 for tsat 8; on Niferex daily.   Goal of Therapy:  INR 2.5-3.5  per Dr. Kipp Brood Monitor platelets by anticoagulation protocol: Yes   Plan:  Continue Warfarin 5 mg daily. Daily PT/INR for now. CBC and bmet at least q72hrs. Monitor for s/sx bleeding.  Romilda Garret, PharmD PGY1 Acute Care Pharmacy Resident 09/02/2020 7:47 AM  Please check [email or web address] for unit specific pharmacy phone numbers.

## 2020-09-02 NOTE — Progress Notes (Signed)
PROGRESS NOTE    Alex [name]  [id number] DOB: [birth year] DOA: 08/08/2020 PCP: [facility]    Brief Narrative:  Alex [name] is a 40 year old male with past medical history significant for polysubstance abuse, aortic valve endocarditis, Serratia bacteremia, septic emboli causing stroke and acute renal failure requiring hemodialysis who left hospital AMA on 08/08/2020 and returned the same evening after discussion with cardiothoracic surgery regarding need for completion of IV antibiotics.   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  Underwent aortic valve replacement Bentall procedure by CTS,, Dr. Kipp Brood on 08/03/2020. --Continue 6 weeks of cefepime per infectious disease with projected end date 09/15/2020 --Continue Coumadin, pharmacy consulted for dosing/monitoring.  INR today 2.9 --Outpatient follow-up  Severe aortic regurgitation s/p mechanical aortic valve replacement --Continue Coumadin  Anemia Etiology likely secondary to renal dysfunction.  Transfused 3 unit PRBCs during hospitalization. --Continue  Acute renal failure Etiology likely secondary to ATN from acute infectious process as above.  Nephrology was initially consulted and patient underwent HD with last HD on 08/21/2020.  HD catheter has now been removed.  Nephrology now signed off. --Creatinine continues to improve, 4.19>3.93  today --Avoid nephrotoxins, renal dose all medications --BMP daily  Atrial fibrillation/flutter with RVR CHA2DS2-VASc = 3.  -- Diltiazem 180 mg p.o. daily -- On Coumadin as above  Bilateral lower extremity edema Patient continues to have significant edema bilateral lower extremities, 2+.  Lower extremity duplex ultrasound negative for DVT on 08/31/2020. --Furosemide 20 mg p.o. BID --Continue Ace wraps, lower extremity elevation --Strict I's and O's and daily weights --BMP daily  Polysubstance abuse with chronic narcotic dependence --Suboxone 8-2 mg sublingually twice daily  Anxiety: Clonazepam 0.5 mg p.o. twice daily as needed  GERD: Pepcid 20 mg p.o. twice daily  Obesity Body mass index is 35.54 kg/m.  Discussed with patient needs for aggressive lifestyle changes/weight loss as this complicates all facets of care.  Outpatient follow-up with PCP.      DVT prophylaxis: Place TED hose Start: 08/10/20 0757 warfarin (COUMADIN) tablet 5 mg    Code Status: Full Code Family Communication: No family present at bedside this morning  Disposition Plan:  Level of care: Med-Surg Status is: Inpatient  Remains inpatient appropriate because:Ongoing diagnostic testing needed not appropriate for outpatient work up, Unsafe d/c plan, IV treatments appropriate due to intensity of illness or inability to take PO, and Inpatient level of care appropriate due to severity of illness  Dispo: The patient is from: Home              Anticipated d/c is to: Home              Patient currently is not medically stable to d/c.   Difficult to place patient No   Consultants:  Infectious disease Cardiothoracic surgery  Procedures:  Aortic valve replacement, Dr. Kipp Brood 5/12  Antimicrobials:  Cefepime (End date: 09/15/2020)   Subjective: Patient seen examined bedside, resting comfortably.  Eating  breakfast.  No specific complaints this morning.  Patient specifically denies headache, no dizziness,  no chest pain, no palpitations, no shortness of breath, no abdominal pain.  Nursing concerned about his significant lower extremity edema.  No acute events overnight per nursing staff.  Objective: Vitals:   09/01/20 1937 09/01/20 2236 09/02/20 0436 09/02/20 0908  BP: (!) 127/92  126/90 (!) 137/101  Pulse: 96  94 (!) 109  Resp: '17 18 19 15  '$ Temp: 98.4 F (36.9 C) 98.5 F (36.9 C) 98.4 F (36.9 C) (!) 97.5 F (36.4 C)  TempSrc: Oral Oral Oral Oral  SpO2: 100%  100% 100%  Weight:   132.5 kg   Height:        Intake/Output Summary (Last 24 hours) at 09/02/2020 1116 Last data filed at 09/02/2020 0602 Gross per 24 hour  Intake 840 ml  Output 1950 ml  Net -1110 ml   Filed Weights   08/31/20 0500 09/01/20 0500 09/02/20 0436  Weight: 134 kg (!) 138.2 kg 132.5 kg    Examination:  General exam: Appears calm and comfortable, obese Respiratory system: Clear to auscultation. Respiratory effort normal.  On room air Cardiovascular system: S1 & S2 heard, RRR. No JVD, murmurs, rubs, gallops or clicks.  4+ pitting edema bilateral lower extremities Gastrointestinal system: Abdomen is nondistended, soft and nontender. No organomegaly or masses felt. Normal bowel sounds heard. Central nervous system: Alert and oriented. No focal neurological deficits. Extremities: Symmetric 5 x 5 power. Skin: No rashes, lesions or ulcers Psychiatry: Judgement and insight appear normal. Mood & affect appropriate.     Data Reviewed: I have personally reviewed following labs and imaging studies  CBC: Recent Labs  Lab 08/27/20 0119 08/28/20 0634 08/28/20 1709 08/31/20 0433  WBC 6.9 5.6  --  6.1  HGB 7.7* 6.7* 8.8* 8.0*  HCT 24.8* 22.1* 29.3* 25.8*  MCV 87.9 88.4  --  87.8  PLT 229 217  --  [id number]   Basic Metabolic Panel: Recent Labs  Lab 08/27/20 0119 08/28/20 0634 08/29/20 0125 09/01/20 0348  NA 138 139 138 139  K 3.9 4.2 4.0 4.1  CL 108 107 105 107  CO2 '24 25 25 23  '$ GLUCOSE 91 83 84 83  BUN  40* 41* 44* 41*  CREATININE 4.25* 4.34* 4.19* 3.93*  CALCIUM 8.4* 8.4* 8.5* 8.9  PHOS 4.3 4.6 4.5  --    GFR: Estimated Creatinine Clearance: 38.3 mL/min (A) (by C-G formula based on SCr of 3.93 mg/dL (H)). Liver Function Tests: Recent Labs  Lab 08/27/20 0119 08/28/20 0634 08/29/20 0125  ALBUMIN 1.8* 1.9* 1.9*   No results for input(s): LIPASE, AMYLASE in the last 168 hours. No results for input(s): AMMONIA in the last 168 hours. Coagulation Profile: Recent Labs  Lab 08/29/20 0125 08/30/20 0141 08/31/20 0433 09/01/20 0348 09/02/20 0036  INR 2.9* 3.1* 3.4* 3.2* 2.9*   Cardiac Enzymes: No results for input(s): CKTOTAL, CKMB, CKMBINDEX, TROPONINI in the last 168 hours. BNP (last 3 results) No results for input(s): PROBNP in the last 8760 hours. HbA1C: No results for input(s): HGBA1C in the last 72 hours. CBG: No results for input(s): GLUCAP in the last 168 hours. Lipid Profile: No results for input(s): CHOL, HDL, LDLCALC, TRIG, CHOLHDL, LDLDIRECT in the last 72 hours. Thyroid Function Tests: No results for input(s): TSH, T4TOTAL, FREET4, T3FREE, THYROIDAB in the last 72 hours. Anemia Panel: No results for input(s): VITAMINB12, FOLATE, FERRITIN, TIBC, IRON, RETICCTPCT in the last 72 hours. Sepsis Labs: No results  for input(s): PROCALCITON, LATICACIDVEN in the last 168 hours.  No results found for this or any previous visit (from the past 240 hour(s)).       Radiology Studies: VAS [place] LOWER EXTREMITY VENOUS (DVT)  Result Date: 08/31/2020  Lower Venous DVT Study Patient Name:  TAQUON AMICUCCI  Date of Exam:   08/31/2020 Medical Rec #: [id number]          Accession #:    [id number] Date of Birth: [birth year]          Patient Gender: M Patient Age:   65Y Exam Location:  [facility] Procedure:      VAS [place] LOWER EXTREMITY VENOUS (DVT) Referring Phys: [id number] ALLISON L ELLIS --------------------------------------------------------------------------------  Indications:  Edema.  Risk Factors: None identified. Limitations: Body habitus and poor ultrasound/tissue interface. Comparison Study: No prior studies. Performing Technologist: Oliver Hum RVT  Examination Guidelines: A complete evaluation includes B-mode imaging, spectral Doppler, color Doppler, and power Doppler as needed of all accessible portions of each vessel. Bilateral testing is considered an integral part of a complete examination. Limited examinations for reoccurring indications may be performed as noted. The reflux portion of the exam is performed with the patient in reverse Trendelenburg.  +---------+---------------+---------+-----------+----------+-------------------+ RIGHT    CompressibilityPhasicitySpontaneityPropertiesThrombus Aging      +---------+---------------+---------+-----------+----------+-------------------+ CFV      Full           Yes      Yes                                      +---------+---------------+---------+-----------+----------+-------------------+ SFJ      Full                                                             +---------+---------------+---------+-----------+----------+-------------------+ FV Prox  Full                                                             +---------+---------------+---------+-----------+----------+-------------------+ FV Mid                  Yes      Yes                                      +---------+---------------+---------+-----------+----------+-------------------+ FV Distal               Yes      Yes                                      +---------+---------------+---------+-----------+----------+-------------------+ PFV      Full                                                             +---------+---------------+---------+-----------+----------+-------------------+  POP      Full           Yes      Yes                                       +---------+---------------+---------+-----------+----------+-------------------+ PTV      Full                                                             +---------+---------------+---------+-----------+----------+-------------------+ PERO                                                  Not well visualized +---------+---------------+---------+-----------+----------+-------------------+   +---------+---------------+---------+-----------+----------+-------------------+ LEFT     CompressibilityPhasicitySpontaneityPropertiesThrombus Aging      +---------+---------------+---------+-----------+----------+-------------------+ CFV      Full           Yes      Yes                                      +---------+---------------+---------+-----------+----------+-------------------+ SFJ      Full                                                             +---------+---------------+---------+-----------+----------+-------------------+ FV Prox  Full                                                             +---------+---------------+---------+-----------+----------+-------------------+ FV Mid   Full                                                             +---------+---------------+---------+-----------+----------+-------------------+ FV Distal               Yes      Yes                                      +---------+---------------+---------+-----------+----------+-------------------+ PFV      Full                                                             +---------+---------------+---------+-----------+----------+-------------------+ POP      Full  Yes      Yes                                      +---------+---------------+---------+-----------+----------+-------------------+ PTV      Full                                                             +---------+---------------+---------+-----------+----------+-------------------+  PERO                                                  Not well visualized +---------+---------------+---------+-----------+----------+-------------------+     Summary: RIGHT: - There is no evidence of deep vein thrombosis in the lower extremity. However, portions of this examination were limited- see technologist comments above.  - No cystic structure found in the popliteal fossa.  LEFT: - There is no evidence of deep vein thrombosis in the lower extremity. However, portions of this examination were limited- see technologist comments above.  - No cystic structure found in the popliteal fossa.  *See table(s) above for measurements and observations. Electronically signed by Monica Martinez MD on 08/31/2020 at 7:44:35 PM.    Final         Scheduled Meds:  (feeding supplement) PROSource Plus  30 mL Oral BID BM   buprenorphine-naloxone  1 tablet Sublingual BID   Chlorhexidine Gluconate Cloth  6 each Topical Daily   collagenase   Topical Daily   darbepoetin (ARANESP) injection - NON-DIALYSIS  150 mcg Subcutaneous Q Sat-1800   diltiazem  180 mg Oral Daily   famotidine  20 mg Oral BID   furosemide  20 mg Oral BID   iron polysaccharides  150 mg Oral Daily   lidocaine  1 patch Transdermal Q24H   melatonin  3 mg Oral QHS   methocarbamol  500 mg Oral TID   nicotine  14 mg Transdermal Daily   polyethylene glycol  17 g Oral Daily   senna-docusate  1 tablet Oral BID   warfarin  5 mg Oral q1600   Warfarin - Pharmacist Dosing Inpatient   Does not apply q1600   Continuous Infusions:  ceFEPime (MAXIPIME) IV 2 g (09/02/20 0602)     LOS: 25 days    Time spent: 41 minutes spent on chart review, discussion with nursing staff, consultants, updating family and interview/physical exam; more than 50% of that time was spent in counseling and/or coordination of care.    Jaella Weinert J [place] DO [facility] Available via Epic secure chat 7am-7pm After these hours, please refer to coverage provider  listed on [email or web address] 09/02/2020, 11:16 AM

## 2020-09-03 LAB — CBC
HCT: 25.3 % — ABNORMAL LOW (ref 39.0–52.0)
Hemoglobin: 7.9 g/dL — ABNORMAL LOW (ref 13.0–17.0)
MCH: 27.3 pg (ref 26.0–34.0)
MCHC: 31.2 g/dL (ref 30.0–36.0)
MCV: 87.5 fL (ref 80.0–100.0)
Platelets: 281 10*3/uL (ref 150–400)
RBC: 2.89 MIL/uL — ABNORMAL LOW (ref 4.22–5.81)
RDW: 15.3 % (ref 11.5–15.5)
WBC: 6.1 10*3/uL (ref 4.0–10.5)
nRBC: 0 % (ref 0.0–0.2)

## 2020-09-03 LAB — BASIC METABOLIC PANEL
Anion gap: 10 (ref 5–15)
BUN: 36 mg/dL — ABNORMAL HIGH (ref 6–20)
CO2: 24 mmol/L (ref 22–32)
Calcium: 8.7 mg/dL — ABNORMAL LOW (ref 8.9–10.3)
Chloride: 105 mmol/L (ref 98–111)
Creatinine, Ser: 3.8 mg/dL — ABNORMAL HIGH (ref 0.61–1.24)
GFR, Estimated: 20 mL/min — ABNORMAL LOW (ref >60)
Glucose, Bld: 90 mg/dL (ref 70–99)
Potassium: 4.1 mmol/L (ref 3.5–5.1)
Sodium: 139 mmol/L (ref 135–145)

## 2020-09-03 LAB — PROTIME-INR
INR: 3 — ABNORMAL HIGH (ref 0.8–1.2)
Prothrombin Time: 31.5 seconds — ABNORMAL HIGH (ref 11.4–15.2)

## 2020-09-03 MED ORDER — NICOTINE 7 MG/24HR TD PT24
7.0000 mg | MEDICATED_PATCH | Freq: Every day | TRANSDERMAL | Status: DC
  Administered 2020-09-03 – 2020-09-15 (×12): 7 mg via TRANSDERMAL
  Filled 2020-09-03 (×13): qty 1

## 2020-09-03 MED ORDER — NICOTINE 7 MG/24HR TD PT24: 7.0000 mg | MEDICATED_PATCH | Freq: Every day | TRANSDERMAL | Status: DC

## 2020-09-03 MED ORDER — WARFARIN SODIUM 2 MG PO TABS
3.0000 mg | ORAL_TABLET | Freq: Once | ORAL | Status: AC
  Administered 2020-09-03: 3 mg via ORAL
  Filled 2020-09-03: qty 1

## 2020-09-03 NOTE — Progress Notes (Signed)
PROGRESS NOTE    Alex [name]  [id number] DOB: 18-Aug-1980 DOA: 08/08/2020 [facility]CP: [facility]    Brief Narrative:  Alex [name] is a 40 year old male with past medical history significant for polysubstance abuse, aortic valve endocarditis, Serratia bacteremia, septic emboli causing stroke and acute renal failure requiring hemodialysis who left hospital AMA on 08/08/2020 and returned the same evening after discussion with cardiothoracic surgery regarding need for completion of IV antibiotics.   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  Underwent aortic valve replacement Bentall procedure by CTS, Dr. Kipp Brood on 08/03/2020. --Cefepime changed to Levaquin '750mg'$  PO every other day by ID on 6/11 for poor venous access. End date 09/15/2020 --Continue Coumadin, pharmacy consulted for dosing/monitoring.  INR today 3.0 --Outpatient follow-up  Severe aortic regurgitation s/p mechanical aortic valve replacement --Continue Coumadin  Anemia Etiology likely secondary to renal dysfunction.  Transfused 3 unit PRBCs during hospitalization. --Continue  Acute renal failure Etiology likely secondary to ATN from acute infectious process as above.  Nephrology was initially consulted and patient underwent HD with last HD on 08/21/2020.  HD catheter has now been removed.  Nephrology now signed off. --Creatinine continues  to improve, 4.19>3.93>3.80 today --Avoid nephrotoxins, renal dose all medications --BMP daily  Atrial fibrillation/flutter with RVR CHA2DS2-VASc = 3.  -- Diltiazem 180 mg p.o. daily -- On Coumadin as above  Bilateral lower extremity edema Patient continues to have significant edema bilateral lower extremities, 2+.  Lower extremity duplex ultrasound negative for DVT on 08/31/2020. --net negative 4.0L past 24h and net negative 36.8L since admission --Continue Furosemide 20 mg p.o. BID (started 6/11) --Continue Ace wraps, lower extremity elevation --Strict I's and O's and daily weights --BMP daily  Polysubstance abuse with chronic narcotic dependence --Suboxone 8-2 mg sublingually twice daily  Anxiety: Clonazepam 0.5 mg p.o. twice daily as needed  GERD: Pepcid 20 mg p.o. twice daily  Obesity Body mass index is 35.23 kg/m.  Discussed with patient needs for aggressive lifestyle changes/weight loss as this complicates all facets of care.  Outpatient follow-up with PCP.      DVT prophylaxis: Place TED hose Start: 08/10/20 0757 warfarin (COUMADIN) tablet 3 mg    Code Status: Full Code Family Communication: No family present at bedside this morning  Disposition Plan:  Level of care: Med-Surg Status is: Inpatient  Remains inpatient appropriate because:Ongoing diagnostic testing needed not appropriate for outpatient work up, Unsafe d/c plan, IV treatments appropriate due to intensity of illness or inability to take PO, and Inpatient level of care appropriate due to severity of illness  Dispo: The patient is from: Home              Anticipated d/c is to: Home              Patient currently is not medically stable to d/c.   Difficult to place patient No   Consultants:  Infectious disease Cardiothoracic surgery  Procedures:  Aortic valve replacement,  Dr. Kipp Brood 5/12  Antimicrobials:  Cefepime stopped 6/11 Levaquin '750mg'$  PO every other day 6/11>> (End date:  09/15/2020)   Subjective: Patient seen examined bedside, resting comfortably.  Reports good urine output overnight.  Creatinine improved.  Changed to oral Levaquin by ID for poor venous access yesterday.  No other questions or concerns at this time. Patient specifically denies headache, no dizziness, no chest pain, no palpitations, no shortness of breath, no abdominal pain.  Nursing concerned about his significant lower extremity edema.  No acute events overnight per nursing staff.  Objective: Vitals:   09/02/20 1943 09/02/20 2239 09/03/20 0443 09/03/20 0724  BP: (!) 154/97 (!) 146/98 (!) 145/93 (!) 139/96  Pulse: 99 98 93 89  Resp: '16 18 18 18  '$ Temp: 98.4 F (36.9 C) 98 F (36.7 C) 98 F (36.7 C) 98.1 F (36.7 C)  TempSrc: Oral Oral Oral Oral  SpO2: 99% 100% 95% 100%  Weight:   131.3 kg   Height:        Intake/Output Summary (Last 24 hours) at 09/03/2020 1052 Last data filed at 09/03/2020 [id number] Gross per 24 hour  Intake --  Output 4995 ml  Net -4995 ml   Filed Weights   09/01/20 0500 09/02/20 0436 09/03/20 0443  Weight: (!) 138.2 kg 132.5 kg 131.3 kg    Examination:  General exam: Appears calm and comfortable, obese Respiratory system: Clear to auscultation. Respiratory effort normal.  On room air Cardiovascular system: S1 & S2 heard, RRR. No JVD, murmurs, rubs, gallops or clicks.  4+ pitting edema bilateral lower extremities Gastrointestinal system: Abdomen is nondistended, soft and nontender. No organomegaly or masses felt. Normal bowel sounds heard. Central nervous system: Alert and oriented. No focal neurological deficits. Extremities: Symmetric 5 x 5 power. Skin: No rashes, lesions or ulcers Psychiatry: Judgement and insight appear normal. Mood & affect appropriate.     Data Reviewed: I have personally reviewed following labs and imaging studies  CBC: Recent Labs  Lab 08/28/20 0634 08/28/20 1709 08/31/20 0433 09/03/20 0605  WBC 5.6  --  6.1 6.1  HGB 6.7*  8.8* 8.0* 7.9*  HCT 22.1* 29.3* 25.8* 25.3*  MCV 88.4  --  87.8 87.5  PLT 217  --  260 [id number]   Basic Metabolic Panel: Recent Labs  Lab 08/28/20 0634 08/29/20 0125 09/01/20 0348 09/03/20 0605  NA 139 138 139 139  K 4.2 4.0 4.1 4.1  CL 107 105 107 105  CO2 '25 25 23 24  '$ GLUCOSE 83 84 83 90  BUN 41* 44* 41* 36*  CREATININE 4.34* 4.19* 3.93* 3.80*  CALCIUM 8.4* 8.5* 8.9 8.7*  PHOS 4.6 4.5  --   --    GFR: Estimated Creatinine Clearance: 39.4 mL/min (A) (by C-G formula based on SCr of 3.8 mg/dL (H)). Liver Function Tests: Recent Labs  Lab 08/28/20 0634 08/29/20 0125  ALBUMIN 1.9* 1.9*   No results for input(s): LIPASE, AMYLASE in the last 168 hours. No results for input(s): AMMONIA in the last 168 hours. Coagulation Profile: Recent Labs  Lab 08/30/20 0141 08/31/20 0433 09/01/20 0348 09/02/20 0036 09/03/20 0605  INR 3.1* 3.4* 3.2* 2.9* 3.0*   Cardiac Enzymes: No results for input(s): CKTOTAL, CKMB, CKMBINDEX, TROPONINI in the last 168 hours. BNP (last 3 results) No results for input(s): PROBNP in the last 8760 hours. HbA1C: No results for input(s): HGBA1C in the last 72 hours. CBG: No results for input(s): GLUCAP in the last 168 hours. Lipid Profile: No results for input(s): CHOL,  HDL, LDLCALC, TRIG, CHOLHDL, LDLDIRECT in the last 72 hours. Thyroid Function Tests: No results for input(s): TSH, T4TOTAL, FREET4, T3FREE, THYROIDAB in the last 72 hours. Anemia Panel: No results for input(s): VITAMINB12, FOLATE, FERRITIN, TIBC, IRON, RETICCTPCT in the last 72 hours. Sepsis Labs: No results for input(s): PROCALCITON, LATICACIDVEN in the last 168 hours.  No results found for this or any previous visit (from the past 240 hour(s)).       Radiology Studies: No results found.      Scheduled Meds:  (feeding supplement) PROSource Plus  30 mL Oral BID BM   buprenorphine-naloxone  1 tablet Sublingual BID   Chlorhexidine Gluconate Cloth  6 each Topical Daily    collagenase   Topical Daily   darbepoetin (ARANESP) injection - NON-DIALYSIS  150 mcg Subcutaneous Q Sat-1800   diltiazem  180 mg Oral Daily   famotidine  20 mg Oral BID   furosemide  20 mg Oral BID   iron polysaccharides  150 mg Oral Daily   levofloxacin  750 mg Oral QODAY   lidocaine  1 patch Transdermal Q24H   melatonin  3 mg Oral QHS   methocarbamol  500 mg Oral TID   nicotine  7 mg Transdermal Daily   polyethylene glycol  17 g Oral Daily   senna-docusate  1 tablet Oral BID   warfarin  3 mg Oral ONCE-1600   Warfarin - Pharmacist Dosing Inpatient   Does not apply q1600   Continuous Infusions:     LOS: 26 days    Time spent: 38 minutes spent on chart review, discussion with nursing staff, consultants, updating family and interview/physical exam; more than 50% of that time was spent in counseling and/or coordination of care.    Trinity Hyland J [place] DO [facility] Available via Epic secure chat 7am-7pm After these hours, please refer to coverage provider listed on [email or web address] 09/03/2020, 10:52 AM

## 2020-09-03 NOTE — Progress Notes (Addendum)
ANTICOAGULATION CONSULT NOTE - Follow Up Consult  Pharmacy Consult for Warfarin  Indication:  mechanical aortic valve    No Known Allergies  Patient Measurements: Height: '6\' 4"'$  (193 cm) Weight: 131.3 kg (289 lb 7.4 oz) IBW/kg (Calculated) : 86.8  Vital Signs: Temp: 98.1 F (36.7 C) (06/12 0724) Temp Source: Oral (06/12 0724) BP: 139/96 (06/12 0724) Pulse Rate: 89 (06/12 0724)  Labs: Recent Labs    09/01/20 0348 09/02/20 0036 09/03/20 0605  HGB  --   --  7.9*  HCT  --   --  25.3*  PLT  --   --  281  LABPROT 32.5* 29.9* 31.5*  INR 3.2* 2.9* 3.0*  CREATININE 3.93*  --  3.80*    Estimated Creatinine Clearance: 39.4 mL/min (A) (by C-G formula based on SCr of 3.8 mg/dL (H)).  Assessment: 39yom with recent endocarditis> AV root repair and Mechanical AVR on 08/03/20. Warfarin started 5/13. Left AMA 5/16 pm then returned 5/17 pm. INR 2.6 on 5/18 and Warfarin resumed. Pharmacy was consulted 5/27 (INR 1.8) to bridge with heparin until INR >2.5 again. Heparin stopped 6/1 when INR 3.1.    INR continues to be therapeutic at 3.0 on warfarin 5 mg daily. Hgb 7.9 (stable), PLTs WNL. Patient switched from cefepime to Levaquin per ID. Levaquin may increase INR and anticoagulant effects of warfarin.   Goal of Therapy:  INR 2.5-3.5  per Dr. Kipp Brood Monitor platelets by anticoagulation protocol: Yes   Plan:  Decrease warfarin to 3 mg x1 Daily PT/INR  Romilda Garret, PharmD PGY1 Acute Care Pharmacy Resident 09/03/2020 8:29 AM  Please check [email or web address] for unit specific pharmacy phone numbers.

## 2020-09-04 DIAGNOSIS — I7789 Other specified disorders of arteries and arterioles: Secondary | ICD-10-CM

## 2020-09-04 LAB — BASIC METABOLIC PANEL
Anion gap: 8 (ref 5–15)
BUN: 34 mg/dL — ABNORMAL HIGH (ref 6–20)
CO2: 25 mmol/L (ref 22–32)
Calcium: 8.8 mg/dL — ABNORMAL LOW (ref 8.9–10.3)
Chloride: 104 mmol/L (ref 98–111)
Creatinine, Ser: 3.74 mg/dL — ABNORMAL HIGH (ref 0.61–1.24)
GFR, Estimated: 20 mL/min — ABNORMAL LOW (ref >60)
Glucose, Bld: 88 mg/dL (ref 70–99)
Potassium: 3.8 mmol/L (ref 3.5–5.1)
Sodium: 137 mmol/L (ref 135–145)

## 2020-09-04 LAB — PROTIME-INR
INR: 2.7 — ABNORMAL HIGH (ref 0.8–1.2)
Prothrombin Time: 28.3 seconds — ABNORMAL HIGH (ref 11.4–15.2)

## 2020-09-04 MED ORDER — SODIUM CHLORIDE 0.9 % IV SOLN
2.0000 g | Freq: Two times a day (BID) | INTRAVENOUS | Status: AC
  Administered 2020-09-04 – 2020-09-15 (×23): 2 g via INTRAVENOUS
  Filled 2020-09-04 (×24): qty 2

## 2020-09-04 MED ORDER — WARFARIN SODIUM 4 MG PO TABS
4.0000 mg | ORAL_TABLET | Freq: Once | ORAL | Status: AC
  Administered 2020-09-04: 4 mg via ORAL
  Filled 2020-09-04: qty 1

## 2020-09-04 MED ORDER — SODIUM CHLORIDE 0.9% FLUSH
10.0000 mL | INTRAVENOUS | Status: DC | PRN
  Administered 2020-09-05 – 2020-09-09 (×2): 10 mL

## 2020-09-04 NOTE — Progress Notes (Signed)
ANTICOAGULATION CONSULT NOTE - Follow Up Consult  Pharmacy Consult for Warfarin  Indication:  mechanical aortic valve    No Known Allergies  Patient Measurements: Height: '6\' 4"'$  (193 cm) Weight: 129.3 kg (285 lb) IBW/kg (Calculated) : 86.8  Vital Signs: Temp: 98.6 F (37 C) (06/13 0732) Temp Source: Oral (06/13 0732) BP: 143/97 (06/13 0732) Pulse Rate: 91 (06/13 0732)  Labs: Recent Labs    09/02/20 0036 09/03/20 0605 09/04/20 0346  HGB  --  7.9*  --   HCT  --  25.3*  --   PLT  --  281  --   LABPROT 29.9* 31.5* 28.3*  INR 2.9* 3.0* 2.7*  CREATININE  --  3.80* 3.74*    Estimated Creatinine Clearance: 39.7 mL/min (A) (by C-G formula based on SCr of 3.74 mg/dL (H)).  Assessment: 39yom with recent endocarditis> AV root repair and Mechanical AVR on 08/03/20. Warfarin started 5/13. Left AMA 5/16 pm then returned 5/17 pm. INR 2.6 on 5/18 and Warfarin resumed. Pharmacy was consulted 5/27 (INR 1.8) to bridge with heparin until INR >2.5 again. Heparin stopped 6/1 when INR 3.1.    INR continues to be therapeutic at 2.7 with goal INR 2.5-3.5  On 09/03/20 Hgb 7.9 (stable), PLTs WNL. No bleeding reported.  Patient 's antibiotic was switched on 6/11 due to poor IV access, from cefepime to oral Levaquin per ID. Levaquin may increase INR and anticoagulant effects of warfarin.   Goal of Therapy:  INR 2.5-3.5  per Dr. Kipp Brood Monitor platelets by anticoagulation protocol: Yes   Plan:  Give Warfarin 4 mg x1 Daily PT/INR  Nicole Cella, RPh Clinical Pharmacist 09/04/2020 9:37 AM [phone] Please check [email or web address] for unit specific pharmacy phone numbers.

## 2020-09-04 NOTE — Progress Notes (Signed)
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  Currently on 6-week course of antibiotics planned through 6/24.  Lost IV access over the weekend and so was transitioned to Levaquin for the time being. AKI requiring CRRT and HD: Currently not requiring either with improved renal function.  Creatinine this morning is 3.7 with creatinine clearance 39 History of injection drug use: Now on Suboxone   PLAN:    Discussed with primary team and nursing.  Would prefer that patient continue with IV antibiotics as planned with cefepime.  Recommend midline placement to avoid frequent loss of PIV access. Recommend continuing IV antibiotics over oral therapy in the setting of aortic valve endocarditis complicated by aortic annular abscess under the left main and left cusp with sinus tract adjacent to the left atrium that was seen during his intraoperative TEE Patient has follow-up already in place with Dr. [place] Bali on 6/28 after discharge   Principal Problem:   Endocarditis Active Problems:   Opioid use disorder   Acute renal failure with oliguria ([place])   Cerebral embolism with cerebral infarction   Anemia   Acute kidney injury (AKI) with acute tubular necrosis (ATN) (HCC)   Atrial fibrillation with RVR (HCC)   Typical atrial flutter (HCC)   Bilateral lower extremity edema   Status post mechanical aortic valve replacement    MEDICATIONS:    Scheduled Meds: . (feeding supplement) PROSource Plus  30 mL Oral BID BM  . buprenorphine-naloxone  1 tablet Sublingual BID  . Chlorhexidine Gluconate Cloth  6 each Topical Daily  . collagenase   Topical Daily  . darbepoetin (ARANESP) injection - NON-DIALYSIS  150 mcg Subcutaneous Q Sat-1800  . diltiazem  180 mg Oral Daily  .  famotidine  20 mg Oral BID  . furosemide  20 mg Oral BID  . iron polysaccharides  150 mg Oral Daily  . lidocaine  1 patch Transdermal Q24H  . melatonin  3 mg Oral QHS  . methocarbamol  500 mg Oral TID  . nicotine  7 mg Transdermal Daily  . polyethylene glycol  17 g Oral Daily  . senna-docusate  1 tablet Oral BID  . warfarin  4 mg Oral ONCE-1600  . Warfarin - Pharmacist Dosing Inpatient   Does not apply q1600   Continuous Infusions: . ceFEPime (MAXIPIME) IV     PRN Meds:.acetaminophen **OR** acetaminophen, bisacodyl, clonazePAM, lactulose, ondansetron **OR** ondansetron (ZOFRAN) IV  SUBJECTIVE:   24 hour events:  No acute events noted overnight Patient continues on Levaquin after IV access was lost He denies any fevers or chills.  He is tolerating antibiotics.  No nausea or vomiting.  Seems frustrated with current situation.   Review of Systems  All other systems reviewed and are negative.    OBJECTIVE:   Blood pressure (!) 143/97, pulse 91, temperature 98.6 F (37 C), temperature source Oral, resp. rate 18, height '6\' 4"'$  (1.93 m), weight 129.3 kg, SpO2 98 %. Body mass index is 34.69 kg/m.  Physical Exam Constitutional:      General: He is not in acute distress.    Appearance: Normal appearance.  Pulmonary:     Effort: Pulmonary effort is normal. No respiratory distress.  Abdominal:     Tenderness: There  is no abdominal tenderness.  Musculoskeletal:     Right lower leg: Edema present.     Left lower leg: Edema present.  Skin:    General: Skin is warm and dry.     Findings: No rash.  Neurological:     General: No focal deficit present.     Mental Status: He is alert and oriented to person, place, and time.  Psychiatric:        Mood and Affect: Mood normal.        Behavior: Behavior normal.     Lab Results: Lab Results  Component Value Date   WBC 6.1 09/03/2020   HGB 7.9 (L) 09/03/2020   HCT 25.3 (L) 09/03/2020   MCV 87.5 09/03/2020   PLT 281 09/03/2020     Lab Results  Component Value Date   NA 137 09/04/2020   K 3.8 09/04/2020   CO2 25 09/04/2020   GLUCOSE 88 09/04/2020   BUN 34 (H) 09/04/2020   CREATININE 3.74 (H) 09/04/2020   CALCIUM 8.8 (L) 09/04/2020   GFRNONAA 20 (L) 09/04/2020   GFRAA >90 03/10/2012    Lab Results  Component Value Date   ALT 9 08/20/2020   AST 16 08/20/2020   ALKPHOS 53 08/20/2020   BILITOT 0.5 08/20/2020    No results found for: CRP  No results found for: ESRSEDRATE   I have reviewed the micro and lab results in Epic.  Imaging: No results found.   Imaging independently reviewed in Epic.    Raynelle [facility] for Infectious [facility] [phone] pager 09/04/2020, 10:38 AM

## 2020-09-04 NOTE — Progress Notes (Signed)
[facility] PROGRESS NOTE  Alex [name] [id number] DOB: 01-Apr-1980 DOA: 08/08/2020 PCP: [facility]  Status: Remains inpatient appropriate because:Unsafe d/c plan, IV treatments appropriate due to intensity of illness or inability to take PO and Inpatient level of care appropriate due to severity of illness. IVDA SO NOT AN APPROPRIATE CANDIDATE FOR HH ANTIBIOTICS VIA PICC LINE   Dispo: The patient is from: Home              Anticipated d/c is to: Home after rehabilitation.  Insurance declined LTAC level of care but given the need for Suboxone (which would exclude patient from going to an SNF) the physician at the [facility] is submitting a peer to peer on 6/7.  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  Discussed with ID and patient is not appropriate to transition over to oral antibiotics at this time.              Patient currently is not medically stable to d/c.   Difficult to place patient Yes   Level of care: Med-Surg  Code Status: Full Family Communication:  DVT prophylaxis: Heparin infusion COVID vaccination status: Received first dose of Pfizer on 08/16/2020  HPI: 40 year old male with history of polysubstance abuse, aortic valve endocarditis, Serratia bacteremia, septic emboli causing stroke and acute kidney injury requiring hemodialysis.  Patient left hospital AMA on 08/08/2020.  He came back same night after discussion with CT surgery.  Significant events:  4/26 transfer from [facility] to [facility] CT head 4/26 >> no acute findings CT abd/pelvis 4/26 >> cholelithiasis w/o inflammatory chagnes, mild hepatomegaly (25 cm), moderate splenomegaly (16.7 cm), 2 mm non obstructing kidney stone on Lt 4/27 CVL placed. Pressors escalating.  4/28 intubated for TEE, CVC, HD, and A-line placed. CRRT started   Remains on CRRT MRI on 4/30-multiple infarcts 5/4: repeat TEE shows an aortic valve abscess 5/5: extubated , self-removed HD cath so CRRT holiday> developed severe hyperkalemia overnight. 5/6: new temp HD cath placed  5/8: new temp HD cath placed after prior one self-removed by patient 5/9: Orthopantogram neg for abscess 5/10: HD cath replaced by IR [facility] 5/12 to OR for AVR, aortic root reconstruction, closure of aortic sinus track. To 2H ICU on vent post op.  4 units PRBC given intraop.  5/13 extubated, on CRRT, remains on pressors 5/18, left AMA. 5/30 continues on intermittent hemodialysis as directed by nephrology team.  Continues with excellent urinary output.  Creatinine between 2.9 and 4.5  Subjective: Patient without any specific complaints.  Patient lost IV site yesterday and has extremely poor peripheral access and has swelling secondary to multiple infiltrated IVs.  Was temporarily started on oral Levaquin over the weekend until patient could be evaluated by primary ID provider.  Dr. Juleen China reevaluated the patient.  Given patient's history of presenting with aortic root abscess that required surgical debridement as well as new mechanical heart valve the standard of care would be to continue with IV antibiotics for the recommended prescribed duration.  Negative since 5/7 so appropriate to insert midline catheter since patient does not have any PIV access.  This was explained to the patient and he verbalized understanding.  Objective: Vitals:   09/04/20 0732 09/04/20 1140  BP: (!) 143/97 (!) 129/93  Pulse: 91 92  Resp: 18 19  Temp: 98.6 F (37 C) 98.5 F (36.9  C)  SpO2: 98% 99%    Intake/Output Summary (Last 24 hours) at 09/04/2020 1345 Last data filed at 09/04/2020 1141 Gross per 24 hour  Intake 1200 ml  Output 5375 ml  Net -4175 ml   Filed Weights   09/02/20 0436 09/03/20 0443 09/04/20 0353  Weight: 132.5 kg 131.3 kg 129.3 kg    Exam:  Constitutional:  Awaken.  Had been dozing in the bed.  No specific complaint Respiratory: History lungs are clear to auscultation.  Remained stable on room air Cardiovascular: Normal heart sounds, peripheral edema significantly improved after initiation of low-dose oral Lasix over the weekend.  Regular pulse without tachycardia Abdomen:  LBM 6/12, nontender nondistended, normoactive bowel sounds Genitourinary: Voids easily Neurologic: CN 2-12 grossly intact. Sensation intact, DTR normal. Strength 5/5 x all 4 extremities.  Psychiatric: Alert and oriented x3.  Pleasant affect   Assessment/Plan: Acute problems: Aortic valve endocarditis with perivalvular abscess-Serratia bacteremia, septic emboli causing strokes.   -Presented with sepsis physiology required short-term mechanical ventilation   -MRI brain (07/22/2020): multiple acute infarcts.   -Blood cultures: Serratia marcescens -Echocardiogram: severe AI and AR with AV vegetation confirmed by TEE.   -S/p mechanical aortic valve replacement, aortic root construction and closure of aortic sinus tract.   -Needs 6 weeks of cefepime per ID with end date 09/15/20  -Coumadin per pharmacy.  Will need to establish with cardiology in [place] re: Coumadin clinic-ambulatory referral has been sent -Patient left AMA on 08/07/2020 however came back to hospital after speaking with CT surgery on 08/08/2020. -We will need to follow-up with Dr. Kipp Brood after discharge for routine postoperative visit -6/13 midline IV catheter will be placed to continue IV antibiotics  Status post recent mechanical aortic valve replacement -Continue warfarin  Recurrent Anemia/thrombocytopenia -patient is s/p 3 units PRBC during this hospital stay.   -Transfuse PRBC as needed for hemoglobin less than 7.  -Hemolysis work-up negative, and stools have been heme negative -continue Aranesp while hospitalized -Etiology likely secondary to renal dysfunction, persistent significant iron deficiency  and overall chronic debility -Continue niferex -6/6 **iron level 19 with a sat of 18 and ferritin 337 given 1000 mg of IV iro - hemoglobin increased to 8.8 after most recent unit of PRBCs and as of 6/9 was 8.0  Atrial fibrillation/flutter with RVR -Evaluated by cardiology -Maintaining sinus rhythm on CCB and BB  -Continue warfarin -patient will need to follow-up with cardiology in [place] to establish at their Coumadin clinic -CHA2DS2-VASc equals 3  Acute kidney injury -likely from ATN 2/2 acute infection and sepsis physiology -Last HD 5/30 and tunneled dialysis catheter has subsequently been removed, nephrology signed off -Creatinine continues to trend down and as of 6/10 is 3.93 and on 6/13 after Lasix dosing creatinine remained stable at 3.74  Polysubstance abuse -patient has amphetamine abuse, nicotine dependence, opiate dependence on agonist therapy.   -He was on chronic Suboxone therapy at a clinic in [facility] prior to admission. -He told this Probation officer that he will no longer utilize drugs and that most recent circumstances of this admission has convinced him that the risks are too great to continue using IV drugs  Bilateral lower extremity edema -Continues to have persistent edema especially in right lower extremity and now associated with foot pain -6/9 LE duplex neg for DVT -Marked improvement in lower extremity edema after initiation of Lasix 20 mg p.o. twice daily over the weekend.  We will continue this dose and follow creatinine closely (total of 5100 cc  urine output over the past 24 hours) -Likely will continue Lasix 20 p.o. daily after discharge  Obesity BMI-34.14kg/m    Data Reviewed: Basic Metabolic Panel: Recent Labs  Lab 08/29/20 0125 09/01/20 0348 09/03/20 0605 09/04/20 0346  NA 138 139 139 137  K 4.0 4.1 4.1 3.8  CL 105 107 105 104  CO2 '25 23 24 25  '$ GLUCOSE 84 83 90 88  BUN 44* 41* 36* 34*  CREATININE 4.19* 3.93* 3.80* 3.74*  CALCIUM  8.5* 8.9 8.7* 8.8*  PHOS 4.5  --   --   --    Liver Function Tests: Recent Labs  Lab 08/29/20 0125  ALBUMIN 1.9*     CBC: Recent Labs  Lab 08/28/20 1709 08/31/20 0433 09/03/20 0605  WBC  --  6.1 6.1  HGB 8.8* 8.0* 7.9*  HCT 29.3* 25.8* 25.3*  MCV  --  87.8 87.5  PLT  --  260 281    BNP (last 3 results) Recent Labs    07/25/20 0334 07/26/20 0331  BNP 153.5* 76.0     Continuous Infusions:  ceFEPime (MAXIPIME) IV      Principal Problem:   Endocarditis Active Problems:   Opioid use disorder   Acute renal failure with oliguria (HCC)   Cerebral embolism with cerebral infarction   Anemia   Acute kidney injury (AKI) with acute tubular necrosis (ATN) (HCC)   Atrial fibrillation with RVR (HCC)   Typical atrial flutter (HCC)   Bilateral lower extremity edema   Status post mechanical aortic valve replacement   Consultants: Infectious disease PCCM Neurology CT surgery  Procedures: Echocardiogram TEE CRRT [place]  Antibiotics: Anti-infectives (From admission, onward)    Start     Dose/Rate Route Frequency Ordered Stop   09/04/20 2200  ceFEPIme (MAXIPIME) 2 g in sodium chloride 0.9 % 100 mL IVPB        2 g 200 mL/hr over 30 Minutes Intravenous Every 12 hours 09/04/20 0931 09/16/20 0959   09/03/20 1000  levofloxacin (LEVAQUIN) tablet 750 mg  Status:  Discontinued        750 mg Oral Every other day 09/02/20 1653 09/04/20 0931   09/02/20 1745  levofloxacin (LEVAQUIN) tablet 750 mg        750 mg Oral  Once 09/02/20 1653 09/02/20 1819   08/25/20 1800  ceFEPIme (MAXIPIME) 2 g in sodium chloride 0.9 % 100 mL IVPB  Status:  Discontinued        2 g 200 mL/hr over 30 Minutes Intravenous Every 12 hours 08/25/20 1618 09/02/20 1653   08/20/20 2200  ceFEPIme (MAXIPIME) 1 g in sodium chloride 0.9 % 100 mL IVPB  Status:  Discontinued        1 g 200 mL/hr over 30 Minutes Intravenous Every 24 hours 08/20/20 1523 08/25/20 1618   08/13/20 2200  ceFEPIme (MAXIPIME) 2 g in  sodium chloride 0.9 % 100 mL IVPB  Status:  Discontinued        2 g 200 mL/hr over 30 Minutes Intravenous Every 24 hours 08/13/20 1154 08/20/20 1523   08/09/20 2200  ceFEPIme (MAXIPIME) 1 g in sodium chloride 0.9 % 100 mL IVPB  Status:  Discontinued        1 g 200 mL/hr over 30 Minutes Intravenous Every 24 hours 08/09/20 1238 08/13/20 1154   08/09/20 0000  ceFEPIme (MAXIPIME) 1 g in sodium chloride 0.9 % 100 mL IVPB  Status:  Discontinued        1 g 200 mL/hr over  30 Minutes Intravenous Every 24 hours 08/08/20 2305 08/09/20 1238        Time spent: 25 minutes    Erin Hearing ANP  [facility] 7 am - 330 pm/M-F for direct patient care and secure chat Please refer to Amion for contact info 27  days

## 2020-09-04 NOTE — TOC Progression Note (Signed)
Transition of Care [facility]) - Progression Note    Patient Details  Name: Alex [name] MRN: [id number] Date of Birth: 11-29-1980  Transition of Care [facility]) CM/SW [place], RN Phone Number: 09/04/2020, 10:30 AM  Clinical Narrative:    Case management spoke with [name], CM at [facility]/ LTAC facility and the expedited appeal for placement is still under review at this time.  Dara Lords, CM is contacting [employer] this morning to determine the decision of the insurance company at this time.  CM will continue to follow the patient for transitions of care needs.   Expected Discharge Plan: Long Term Acute Care (LTAC) Barriers to Discharge: Continued Medical Work up  Expected Discharge Plan and Services Expected Discharge Plan: [place] (LTAC)   Discharge Planning Services: CM Consult Post Acute Care Choice: Long Term Acute Care (LTAC) Living arrangements for the past 2 months: Single Family Home                                       Social Determinants of Health (SDOH) Interventions    Readmission Risk Interventions Readmission Risk Prevention Plan 08/25/2020  Medication Review (RN Care Manager) Complete  SW Recovery Care/Counseling Consult Complete  Skilled Nursing Facility Complete  Some recent data might be hidden

## 2020-09-05 LAB — PROTIME-INR
INR: 2.1 — ABNORMAL HIGH (ref 0.8–1.2)
Prothrombin Time: 23.6 seconds — ABNORMAL HIGH (ref 11.4–15.2)

## 2020-09-05 MED ORDER — WARFARIN SODIUM 5 MG PO TABS
6.0000 mg | ORAL_TABLET | Freq: Once | ORAL | Status: AC
  Administered 2020-09-05: 6 mg via ORAL
  Filled 2020-09-05: qty 1

## 2020-09-05 NOTE — Progress Notes (Signed)
PT Cancellation Note  Patient Details Name: Alex [name] MRN: [id number] DOB: December 03, 1980   Cancelled Treatment:    Reason Eval/Treat Not Completed: (P) Other (comment) (pt INR subtherapeutic.) Will continue efforts per PT POC once pt anticoagulation within acceptable range.   Jule Schlabach M Sherif Millspaugh 09/05/2020, 10:02 AM

## 2020-09-05 NOTE — Progress Notes (Signed)
ANTICOAGULATION CONSULT NOTE - Follow Up Consult  Pharmacy Consult for Warfarin  Indication:  mechanical aortic valve    No Known Allergies  Patient Measurements: Height: '6\' 4"'$  (193 cm) Weight: 126.5 kg (278 lb 14.1 oz) IBW/kg (Calculated) : 86.8  Vital Signs: Temp: 98.6 F (37 C) (06/14 0734) Temp Source: Oral (06/14 0734) BP: 141/88 (06/14 0734) Pulse Rate: 93 (06/14 0734)  Labs: Recent Labs    09/03/20 0605 09/04/20 0346 09/05/20 0205  HGB 7.9*  --   --   HCT 25.3*  --   --   PLT 281  --   --   LABPROT 31.5* 28.3* 23.6*  INR 3.0* 2.7* 2.1*  CREATININE 3.80* 3.74*  --     Estimated Creatinine Clearance: 39.3 mL/min (A) (by C-G formula based on SCr of 3.74 mg/dL (H)).  Assessment: 39yom with recent endocarditis> AV root repair and Mechanical AVR on 08/03/20. Warfarin started 5/13. Left AMA 5/16 pm then returned 5/17 pm. INR 2.6 on 5/18 and Warfarin resumed. Pharmacy was consulted 5/27 (INR 1.8) to bridge with heparin until INR >2.5 again. Heparin stopped 6/1 when INR 3.1.    INR decreased to 2.1, sub-therapeutic with goal INR 2.5-3.5  Last CBC on 09/03/20 Hgb 7.9 (low/stable), PLTs WNL. No bleeding reported.  Patient 's antibiotic Levaquin was  discontinued and changed to IV Cefepime on 6/13 per ID.    INR drop likely due to lower dose given 2 days ago.   Will increase dose today.   Goal of Therapy:  INR 2.5-3.5  per Dr. Kipp Brood Monitor platelets by anticoagulation protocol: Yes   Plan:  Give Warfarin 6 mg x1 Daily PT/INR CBC q72 hr, next due 6/15.  Nicole Cella, RPh Clinical Pharmacist 09/05/2020 8:25 AM [phone] Please check [email or web address] for unit specific pharmacy phone numbers.

## 2020-09-05 NOTE — Progress Notes (Signed)
[facility] PROGRESS NOTE  Alex [name] [id number] DOB: October 31, 1980 DOA: 08/08/2020 PCP: [facility]  Status: Remains inpatient appropriate because:Unsafe d/c plan, IV treatments appropriate due to intensity of illness or inability to take PO and Inpatient level of care appropriate due to severity of illness. IVDA SO NOT AN APPROPRIATE CANDIDATE FOR HH ANTIBIOTICS VIA PICC LINE   Dispo: The patient is from: Home              Anticipated d/c is to: Home after rehabilitation.  Insurance declined LTAC level of care but given the need for Suboxone (which would exclude patient from going to an SNF) the physician at the [facility] is submitting a peer to peer on 6/7.  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  Discussed with ID and patient is not appropriate to transition over to oral antibiotics at this time.              Patient currently is not medically stable to d/c.   Difficult to place patient Yes   Level of care: Med-Surg  Code Status: Full Family Communication:  DVT prophylaxis: Heparin infusion COVID vaccination status: Received first dose of Pfizer on 08/16/2020  HPI: 40 year old male with history of polysubstance abuse, aortic valve endocarditis, Serratia bacteremia, septic emboli causing stroke and acute kidney injury requiring hemodialysis.  Patient left hospital AMA on 08/08/2020.  He came back same night after discussion with CT surgery.  Significant events:  4/26 transfer from [facility] to [facility] CT head 4/26 >> no acute findings CT abd/pelvis 4/26 >> cholelithiasis w/o inflammatory chagnes, mild hepatomegaly (25 cm), moderate splenomegaly (16.7 cm), 2 mm non obstructing kidney stone on Lt 4/27 CVL placed. Pressors escalating.  4/28 intubated for TEE, CVC, HD, and A-line placed. CRRT started   Remains on CRRT MRI on 4/30-multiple infarcts 5/4: repeat TEE shows an aortic valve abscess 5/5: extubated , self-removed HD cath so CRRT holiday> developed severe hyperkalemia overnight. 5/6: new temp HD cath placed  5/8: new temp HD cath placed after prior one self-removed by patient 5/9: Orthopantogram neg for abscess 5/10: HD cath replaced by IR [facility] 5/12 to OR for AVR, aortic root reconstruction, closure of aortic sinus track. To 2H ICU on vent post op.  4 units PRBC given intraop.  5/13 extubated, on CRRT, remains on pressors 5/18, left AMA. 5/30 continues on intermittent hemodialysis as directed by nephrology team.  Continues with excellent urinary output.  Creatinine between 2.9 and 4.5  Subjective: Sleeping soundly and did not awaken during physical exam  Objective: Vitals:   09/05/20 0520 09/05/20 0734  BP: 127/83 (!) 141/88  Pulse:  93  Resp: 18 20  Temp: 98 F (36.7 C) 98.6 F (37 C)  SpO2: 99% 96%    Intake/Output Summary (Last 24 hours) at 09/05/2020 0817 Last data filed at 09/05/2020 0734 Gross per 24 hour  Intake 1660 ml  Output 5250 ml  Net -3590 ml   Filed Weights   09/03/20 0443 09/04/20 0353 09/05/20 0525  Weight: 131.3 kg 129.3 kg 126.5 kg    Exam:  Constitutional: Sleeping soundly today Respiratory: Lungs are clear, room air Cardiac: Normal heart sounds, regular pulse, marked decrease in edema Abdomen:  LBM 6/12, soft nontender nondistended and appears to be eating well based on I/O Genitourinary: Voids easily Neurologic: CN 2-12 grossly intact. Sensation  intact, DTR normal. Strength 5/5 x all 4 extremities.  Psychiatric: Sleeping soundly so not assessed today   Assessment/Plan: Acute problems: Aortic valve endocarditis with perivalvular abscess-Serratia bacteremia, septic emboli causing strokes.   -Presented with sepsis physiology required short-term mechanical ventilation   -MRI brain (07/22/2020): multiple acute infarcts.   -Blood  cultures: Serratia marcescens -Echocardiogram: severe AI and AR with AV vegetation confirmed by TEE.   -S/p mechanical aortic valve replacement, aortic root construction and closure of aortic sinus tract.   -Needs 6 weeks of cefepime per ID with end date 09/15/20  -Coumadin per pharmacy.  Will need to establish with cardiology in [place] re: Coumadin clinic-ambulatory referral has been sent -Patient left AMA on 08/07/2020 however came back to hospital after speaking with CT surgery on 08/08/2020. -Follow-up with Dr. Kipp Brood after discharge for routine postoperative visit -6/13 midline IV catheter placed 2/2 poor venous access IV antibiotics  Status post recent mechanical aortic valve replacement -Continue warfarin  Recurrent Anemia/thrombocytopenia -patient is s/p 3 units PRBC during this hospital stay.   -Transfuse PRBC as needed for hemoglobin less than 7.  -Aranesp was given until dialysis discontinued  -Etiology likely secondary to renal dysfunction, persistent significant iron deficiency and overall chronic debility -Continue niferex -6/6 **iron level 19 with a sat of 18 and ferritin 337 given 1000 mg of IV iro - hemoglobin remained stable  Atrial fibrillation/flutter with RVR -Evaluated by cardiology -Maintaining sinus rhythm on CCB and BB  -Continue warfarin -patient will need to follow-up with cardiology in [place] to establish at their Coumadin clinic -CHA2DS2-VASc equals 3  Acute kidney injury -likely from ATN 2/2 acute infection and sepsis physiology -Last HD 5/30  -Creatinine continues to trend down and as of 6/10 is 3.93 and on 6/13 after Lasix dosing creatinine remained stable at 3.74  Polysubstance abuse -patient has amphetamine abuse, nicotine dependence, opiate dependence on agonist therapy.   -He was on chronic Suboxone therapy at a clinic in [facility] prior to admission. -He told this Probation officer that he will no longer utilize drugs and that most  recent circumstances of this admission has convinced him that the risks are too great to continue using IV drugs  Bilateral lower extremity edema -Continues to have persistent edema especially in right lower extremity and now associated with foot pain -6/9 LE duplex neg for DVT -Marked improvement in lower extremity edema after initiation of Lasix 20 mg p.o. twice daily over the weekend.  We will continue this dose and follow creatinine closely (total of 6150 cc urine output over the past 24 hours) -Likely will continue Lasix 20 p.o. daily after discharge  Obesity BMI-34.14kg/m    Data Reviewed: Basic Metabolic Panel: Recent Labs  Lab 09/01/20 0348 09/03/20 0605 09/04/20 0346  NA 139 139 137  K 4.1 4.1 3.8  CL 107 105 104  CO2 '23 24 25  '$ GLUCOSE 83 90 88  BUN 41* 36* 34*  CREATININE 3.93* 3.80* 3.74*  CALCIUM 8.9 8.7* 8.8*   Liver Function Tests: No results for input(s): AST, ALT, ALKPHOS, BILITOT, PROT, ALBUMIN in the last 168 hours.    CBC: Recent Labs  Lab 08/31/20 0433 09/03/20 0605  WBC 6.1 6.1  HGB 8.0* 7.9*  HCT 25.8* 25.3*  MCV 87.8 87.5  PLT 260 281    BNP (last 3 results) Recent Labs    07/25/20 0334 07/26/20 0331  BNP 153.5* 76.0     Continuous Infusions:  ceFEPime (MAXIPIME) IV Stopped (09/04/20 2310)  Principal Problem:   Endocarditis Active Problems:   Opioid use disorder   Acute renal failure with oliguria (HCC)   Cerebral embolism with cerebral infarction   Anemia   Acute kidney injury (AKI) with acute tubular necrosis (ATN) (HCC)   Atrial fibrillation with RVR (HCC)   Typical atrial flutter (HCC)   Bilateral lower extremity edema   Status post mechanical aortic valve replacement   Abscess of aorta (HCC)   Consultants: Infectious disease PCCM Neurology CT surgery  Procedures: Echocardiogram TEE CRRT Cortrack  Antibiotics: Anti-infectives (From admission, onward)    Start     Dose/Rate Route Frequency Ordered  Stop   09/04/20 2200  ceFEPIme (MAXIPIME) 2 g in sodium chloride 0.9 % 100 mL IVPB        2 g 200 mL/hr over 30 Minutes Intravenous Every 12 hours 09/04/20 0931 09/16/20 0959   09/03/20 1000  levofloxacin (LEVAQUIN) tablet 750 mg  Status:  Discontinued        750 mg Oral Every other day 09/02/20 1653 09/04/20 0931   09/02/20 1745  levofloxacin (LEVAQUIN) tablet 750 mg        750 mg Oral  Once 09/02/20 1653 09/02/20 1819   08/25/20 1800  ceFEPIme (MAXIPIME) 2 g in sodium chloride 0.9 % 100 mL IVPB  Status:  Discontinued        2 g 200 mL/hr over 30 Minutes Intravenous Every 12 hours 08/25/20 1618 09/02/20 1653   08/20/20 2200  ceFEPIme (MAXIPIME) 1 g in sodium chloride 0.9 % 100 mL IVPB  Status:  Discontinued        1 g 200 mL/hr over 30 Minutes Intravenous Every 24 hours 08/20/20 1523 08/25/20 1618   08/13/20 2200  ceFEPIme (MAXIPIME) 2 g in sodium chloride 0.9 % 100 mL IVPB  Status:  Discontinued        2 g 200 mL/hr over 30 Minutes Intravenous Every 24 hours 08/13/20 1154 08/20/20 1523   08/09/20 2200  ceFEPIme (MAXIPIME) 1 g in sodium chloride 0.9 % 100 mL IVPB  Status:  Discontinued        1 g 200 mL/hr over 30 Minutes Intravenous Every 24 hours 08/09/20 1238 08/13/20 1154   08/09/20 0000  ceFEPIme (MAXIPIME) 1 g in sodium chloride 0.9 % 100 mL IVPB  Status:  Discontinued        1 g 200 mL/hr over 30 Minutes Intravenous Every 24 hours 08/08/20 2305 08/09/20 1238        Time spent: 25 minutes    Erin Hearing ANP  [facility] 7 am - 330 pm/M-F for direct patient care and secure chat Please refer to Amion for contact info 28  days

## 2020-09-06 DIAGNOSIS — R509 Fever, unspecified: Secondary | ICD-10-CM

## 2020-09-06 LAB — COMPREHENSIVE METABOLIC PANEL
ALT: 8 U/L (ref 0–44)
AST: 14 U/L — ABNORMAL LOW (ref 15–41)
Albumin: 2.4 g/dL — ABNORMAL LOW (ref 3.5–5.0)
Alkaline Phosphatase: 45 U/L (ref 38–126)
Anion gap: 9 (ref 5–15)
BUN: 28 mg/dL — ABNORMAL HIGH (ref 6–20)
CO2: 27 mmol/L (ref 22–32)
Calcium: 8.7 mg/dL — ABNORMAL LOW (ref 8.9–10.3)
Chloride: 100 mmol/L (ref 98–111)
Creatinine, Ser: 3.45 mg/dL — ABNORMAL HIGH (ref 0.61–1.24)
GFR, Estimated: 22 mL/min — ABNORMAL LOW (ref >60)
Glucose, Bld: 90 mg/dL (ref 70–99)
Potassium: 3.7 mmol/L (ref 3.5–5.1)
Sodium: 136 mmol/L (ref 135–145)
Total Bilirubin: 0.5 mg/dL (ref 0.3–1.2)
Total Protein: 7.2 g/dL (ref 6.5–8.1)

## 2020-09-06 LAB — C-REACTIVE PROTEIN: CRP: 3.4 mg/dL — ABNORMAL HIGH (ref <1.0)

## 2020-09-06 LAB — CBC
HCT: 27.3 % — ABNORMAL LOW (ref 39.0–52.0)
Hemoglobin: 8.3 g/dL — ABNORMAL LOW (ref 13.0–17.0)
MCH: 26.8 pg (ref 26.0–34.0)
MCHC: 30.4 g/dL (ref 30.0–36.0)
MCV: 88.1 fL (ref 80.0–100.0)
Platelets: 234 10*3/uL (ref 150–400)
RBC: 3.1 MIL/uL — ABNORMAL LOW (ref 4.22–5.81)
RDW: 16.2 % — ABNORMAL HIGH (ref 11.5–15.5)
WBC: 4.9 10*3/uL (ref 4.0–10.5)
nRBC: 0 % (ref 0.0–0.2)

## 2020-09-06 LAB — CK: Total CK: 16 U/L — ABNORMAL LOW (ref 49–397)

## 2020-09-06 LAB — PROTIME-INR
INR: 2 — ABNORMAL HIGH (ref 0.8–1.2)
Prothrombin Time: 22.8 seconds — ABNORMAL HIGH (ref 11.4–15.2)

## 2020-09-06 LAB — LACTATE DEHYDROGENASE: LDH: 164 U/L (ref 98–192)

## 2020-09-06 LAB — RESP PANEL BY RT-PCR (FLU A&B, COVID) ARPGX2
Influenza A by PCR: NEGATIVE
Influenza B by PCR: NEGATIVE
SARS Coronavirus 2 by RT PCR: POSITIVE — AB

## 2020-09-06 MED ORDER — ASCORBIC ACID 500 MG PO TABS
1000.0000 mg | ORAL_TABLET | Freq: Every day | ORAL | Status: DC
  Administered 2020-09-06 – 2020-09-15 (×10): 1000 mg via ORAL
  Filled 2020-09-06 (×10): qty 2

## 2020-09-06 MED ORDER — WARFARIN SODIUM 5 MG PO TABS
6.0000 mg | ORAL_TABLET | Freq: Once | ORAL | Status: AC
  Administered 2020-09-06: 6 mg via ORAL
  Filled 2020-09-06: qty 1

## 2020-09-06 MED ORDER — SODIUM CHLORIDE 0.9 % IV SOLN
100.0000 mg | Freq: Every day | INTRAVENOUS | Status: DC
  Administered 2020-09-07: 100 mg via INTRAVENOUS
  Filled 2020-09-06: qty 20

## 2020-09-06 MED ORDER — ZINC SULFATE 220 (50 ZN) MG PO CAPS
220.0000 mg | ORAL_CAPSULE | Freq: Every day | ORAL | Status: DC
  Administered 2020-09-06 – 2020-09-15 (×10): 220 mg via ORAL
  Filled 2020-09-06 (×10): qty 1

## 2020-09-06 MED ORDER — SODIUM CHLORIDE 0.9 % IV SOLN
200.0000 mg | Freq: Once | INTRAVENOUS | Status: AC
  Administered 2020-09-06: 200 mg via INTRAVENOUS
  Filled 2020-09-06: qty 40

## 2020-09-06 MED ORDER — ADULT MULTIVITAMIN W/MINERALS CH
1.0000 | ORAL_TABLET | Freq: Every day | ORAL | Status: DC
  Administered 2020-09-06 – 2020-09-15 (×10): 1 via ORAL
  Filled 2020-09-06 (×10): qty 1

## 2020-09-06 NOTE — Progress Notes (Signed)
Nutrition Follow-up  DOCUMENTATION CODES:  Not applicable  INTERVENTION:  -Continue 30 ml ProSource Plus BID, each supplement provides 100 kcals and 15 grams protein. -MVI with minerals daily  NUTRITION DIAGNOSIS:  Increased nutrient needs related to post-op healing as evidenced by estimated needs. -- ongoing  GOAL:  Patient will meet greater than or equal to 90% of their needs -- progressing  MONITOR:  PO intake, Supplement acceptance, Weight trends, Labs, I & O's  REASON FOR ASSESSMENT:  Rounds    ASSESSMENT:  Patient with PMH significant for polysubstance use, ruptured lumbar disc, and recent admission for aortic valve endocarditis with perivalvular abscess (see time line below). Left AMA and presents back to ED with AKI and back/knee pain from mechanical fall.  4/28 - intubated, TEE, CRRT initiated 4/29 - Cortrak placed (gastric tip) 5/04 - TEE 5/05 - extubated to BiPAP, CRRT discontinued (pt pulled dialysis catheter) 5/06 - CRRT restarted due to severe hyperkalemia 5/10 - s/p tunneled HD catheter placement 5/12 - s/p AVR, aortic root reconstruction, closure of aortic sinus track 5/16 - transition to iHD 5/17 - left AMA 5/30 - Last HD  Pt was denied admit to LTAC. Pt unable to transition to oral abx making disposition more challenging. Pt is scheduled to finish cefepime on 6/24 per ID recommendations. Note pt did have fevers overnight per RN, repeat blood cultures now pending. Pt tested positive for COVID-19 but is asymptomatic at this time.   Pt continues to have good po intake with 100% meal completion, also typically doing well with supplements per RN. Recommend continue current nutrition plan of care.   Admit wt: 125.1 kg Current wt: 123.8 kg  UOP 3.7L x24 hours  Scheduled Meds:  (feeding supplement) PROSource Plus  30 mL Oral BID BM   vitamin C  1,000 mg Oral Daily   buprenorphine-naloxone  1 tablet Sublingual BID   Chlorhexidine Gluconate Cloth  6 each  Topical Daily   collagenase   Topical Daily   darbepoetin (ARANESP) injection - NON-DIALYSIS  150 mcg Subcutaneous Q Sat-1800   diltiazem  180 mg Oral Daily   famotidine  20 mg Oral BID   iron polysaccharides  150 mg Oral Daily   lidocaine  1 patch Transdermal Q24H   melatonin  3 mg Oral QHS   methocarbamol  500 mg Oral TID   nicotine  7 mg Transdermal Daily   polyethylene glycol  17 g Oral Daily   senna-docusate  1 tablet Oral BID   warfarin  6 mg Oral ONCE-1600   Warfarin - Pharmacist Dosing Inpatient   Does not apply q1600   zinc sulfate  220 mg Oral Daily   Continuous Infusions:  ceFEPime (MAXIPIME) IV 2 g (09/05/20 2146)   remdesivir 200 mg in sodium chloride 0.9% 250 mL IVPB     Followed by   [START ON 09/07/2020] remdesivir 100 mg in NS 100 mL     Labs:  Recent Labs  Lab 09/01/20 0348 09/03/20 0605 09/04/20 0346  NA 139 139 137  K 4.1 4.1 3.8  CL 107 105 104  CO2 '23 24 25  '$ BUN 41* 36* 34*  CREATININE 3.93* 3.80* 3.74*  CALCIUM 8.9 8.7* 8.8*  GLUCOSE 83 90 88   Diet Order:   Diet Order             Diet Heart Room service appropriate? Yes; Fluid consistency: Thin; Fluid restriction: 1500 mL Fluid  Diet effective now  EDUCATION NEEDS:   Education needs have been addressed  Skin:  Skin Integrity Issues:: Other (Comment), Unstageable, Incisions Unstageable: coccyx, R buttocks Incisions: chest Other: MASD- perineum  Last BM:  6/14  Height:   Ht Readings from Last 1 Encounters:  08/08/20 '6\' 4"'$  (1.93 m)    Weight:   Wt Readings from Last 1 Encounters:  09/06/20 123.8 kg    Ideal Body Weight:  91.8 kg  BMI:  Body mass index is 33.23 kg/m.  Estimated Nutritional Needs:   Kcal:  2500-2700 kcal  Protein:  120-140 grams  Fluid:  1000 ml + UOP    Larkin Ina, MS, RD, LDN RD pager number and weekend/on-call pager number located in [place].

## 2020-09-06 NOTE — TOC Progression Note (Signed)
Transition of Care [facility]) - Progression Note    Patient Details  Name: Alex [name] MRN: [id number] Date of Birth: November 16, 1980  Transition of Care [facility]) CM/SW [place], RN Phone Number: 09/06/2020, 10:36 AM  Clinical Narrative:    Case management spoke with [name], CM with [facility] this morning and the patient still in process of expedited insurance appeal for LTAC at [facility].  Dara Lords, CM spoke with Jenny Reichmann in provider services with [employer] and appeal has been marked urgent - with due date of 09/07/2020 - appeal ref# [id number], Auth case ref# [id number] with call [phone].  The patient reported to Lissa Merlin, NP that he had a fever and sore throat and lab orders were placed by Lissa Merlin, NP and Raquel Sarna, CM at [facility] is aware.  CM will continue to follow for transitions of care needs.   Expected Discharge Plan: Long Term Acute Care (LTAC) Barriers to Discharge: Continued Medical Work up  Expected Discharge Plan and Services Expected Discharge Plan: [place] (LTAC)   Discharge Planning Services: CM Consult Post Acute Care Choice: Long Term Acute Care (LTAC) Living arrangements for the past 2 months: Single Family Home                                       Social Determinants of Health (SDOH) Interventions    Readmission Risk Interventions Readmission Risk Prevention Plan 08/25/2020  Medication Review (RN Care Manager) Complete  SW Recovery Care/Counseling Consult Complete  Skilled Nursing Facility Complete  Some recent data might be hidden

## 2020-09-06 NOTE — Progress Notes (Signed)
ANTICOAGULATION CONSULT NOTE - Follow Up Consult  Pharmacy Consult for Warfarin  Indication:  mechanical aortic valve    No Known Allergies  Patient Measurements: Height: '6\' 4"'$  (193 cm) Weight: 123.8 kg (273 lb) IBW/kg (Calculated) : 86.8  Vital Signs: Temp: 98.7 F (37.1 C) (06/15 0901) Temp Source: Oral (06/15 0901) BP: 145/97 (06/15 0901) Pulse Rate: 98 (06/15 0552)  Labs: Recent Labs    09/04/20 0346 09/05/20 0205 09/06/20 0425  HGB  --   --  8.3*  HCT  --   --  27.3*  PLT  --   --  234  LABPROT 28.3* 23.6* 22.8*  INR 2.7* 2.1* 2.0*  CREATININE 3.74*  --   --     Estimated Creatinine Clearance: 38.9 mL/min (A) (by C-G formula based on SCr of 3.74 mg/dL (H)).  Assessment: 39yom with recent endocarditis> AV root repair and Mechanical AVR on 08/03/20. Warfarin started 5/13. Left AMA 5/16 pm then returned 5/17 pm. INR 2.6 on 5/18 and Warfarin resumed. Pharmacy was consulted 5/27 (INR 1.8) to bridge with heparin until INR >2.5 again. Heparin stopped 6/1 when INR 3.1.    INR decreased to 2, sub-therapeutic with goal INR 2.5-3.5   Hgb 8.3 (low/stable), PLTs WNL. No bleeding reported.     INR drop likely due to lower dose given 2 days ago. Will give extra dose again today.   Goal of Therapy:  INR 2.5-3.5  per Dr. Kipp Brood Monitor platelets by anticoagulation protocol: Yes   Plan:  Give Warfarin 6 mg again tonight Daily PT/INR CBC q72 hr, next due 6/18.  Erin Hearing PharmD., BCPS Clinical Pharmacist 09/06/2020 11:50 AM

## 2020-09-06 NOTE — Progress Notes (Signed)
PT Cancellation Note  Patient Details Name: Alex [name] MRN: [id number] DOB: 12/05/1980   Cancelled Treatment:    Reason Eval/Treat Not Completed: (P) Other (comment) (pt INR subtherapeutic.) Will continue efforts next date once pt anticoagulated within range (pt does not appear to also be on heparin/lovenox per chart review). Pt making progress with mobility may be able to clear acute PT in 1-2 more sessions.   Dewanda Fennema [name] 09/06/2020, 2:24 PM

## 2020-09-06 NOTE — Progress Notes (Addendum)
[facility] PROGRESS NOTE  Alex [name] [id number] DOB: [birth year] DOA: 08/08/2020 PCP: [facility]  Status: Remains inpatient appropriate because:Unsafe d/c plan, IV treatments appropriate due to intensity of illness or inability to take PO and Inpatient level of care appropriate due to severity of illness. IVDA SO NOT AN APPROPRIATE CANDIDATE FOR HH ANTIBIOTICS VIA PICC LINE   Dispo: The patient is from: Home              Anticipated d/c is to: Home after rehabilitation.  Insurance declined LTAC level of care but given the need for Suboxone (which would exclude patient from going to an SNF) the physician at the [facility] is submitting a peer to peer on 6/7.  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  Discussed with ID and patient is not appropriate to transition over to oral antibiotics at this time.              Patient currently is not medically stable to d/c.   Difficult to place patient Yes   Level of care: Med-Surg  Code Status: Full Family Communication: 6/15 mother at bedside DVT prophylaxis: Warfarin COVID vaccination status: Received first dose of Pfizer on 08/16/2020  HPI: 40 year old male with history of polysubstance abuse, aortic valve endocarditis, Serratia bacteremia, septic emboli causing stroke and acute kidney injury requiring hemodialysis.  Patient left hospital AMA on 08/08/2020.  He came back same night after discussion with CT surgery.  Significant events:  4/26 transfer from [facility] to [facility] CT head 4/26 >> no acute findings CT abd/pelvis 4/26 >> cholelithiasis w/o inflammatory chagnes, mild hepatomegaly (25 cm), moderate splenomegaly (16.7 cm), 2 mm non obstructing kidney stone on Lt 4/27 CVL placed. Pressors escalating.  4/28 intubated for TEE, CVC, HD, and A-line placed.  CRRT started  Remains on CRRT MRI on 4/30-multiple infarcts 5/4: repeat TEE shows an aortic valve abscess 5/5: extubated , self-removed HD cath so CRRT holiday> developed severe hyperkalemia overnight. 5/6: new temp HD cath placed  5/8: new temp HD cath placed after prior one self-removed by patient 5/9: Orthopantogram neg for abscess 5/10: HD cath replaced by IR [facility] 5/12 to OR for AVR, aortic root reconstruction, closure of aortic sinus track. To 2H ICU on vent post op.  4 units PRBC given intraop.  5/13 extubated, on CRRT, remains on pressors 5/18, left AMA. 5/30 continues on intermittent hemodialysis as directed by nephrology team.  Continues with excellent urinary output.  Creatinine between 2.9 and 4.5  Subjective: Awakened from sleep (normally he is usually up and performing a.m. care at the sink).  No specific complaints.  Denies shortness of breath or dysuria.  Discussed his fever and he states is better since he got Tylenol last night.  Asked him if he had any other symptoms and he reported a sore throat.  Patient informed that as a precaution we would need to ensure he does not have COVID.  Objective: Vitals:   09/06/20 0352 09/06/20 0552  BP: (!) 141/78 139/81  Pulse: 100 98  Resp: 18 18  Temp: (!) 101.7 F (38.7 C) 99.6 F (37.6 C)  SpO2: 98% 96%    Intake/Output Summary (Last 24 hours) at 09/06/2020 0800 Last data filed at 09/06/2020 0744 Gross per 24 hour  Intake 740 ml  Output 3700 ml  Net -2960 ml   Danley Danker  Weights   09/04/20 0353 09/05/20 0525 09/06/20 0352  Weight: 129.3 kg 126.5 kg 123.8 kg    Exam:  Constitutional: Awaken from sleep, seems more lethargic than usual but is appropriate during conversation.  No acute distress otherwise. Respiratory: Anterior lung sounds are clear.  He is stable on room air.  Sats have ranged from 92% to 96% in the past 24 hours Cardiac: Normal heart sounds, heart rate stable in the 90s to low 100s range which has been  consistent over multiple days.  Significant improvement in peripheral edema. Abdomen:  LBM 6/14, continues with excellent appetite.  Bowel sounds normal.  Nontender. Genitourinary: Voids easily Neurologic: CN 2-12 grossly intact. Sensation intact, DTR normal. Strength 5/5 x all 4 extremities.  Psychiatric: Awake and oriented x3.  Somewhat bland affect today which is not usual for him.   Assessment/Plan: Acute problems: Aortic valve endocarditis with perivalvular abscess-Serratia bacteremia, septic emboli causing strokes.   -Presented with sepsis physiology required short-term mechanical ventilation   -MRI brain (07/22/2020): multiple acute infarcts.   -Blood cultures: Serratia marcescens -Echocardiogram: severe AI and AR with AV vegetation confirmed by TEE.   -S/p mechanical aortic valve replacement, aortic root construction and closure of aortic sinus tract.   -Needs 6 weeks of cefepime per ID with end date 09/15/20  -Coumadin per pharmacy.  Will need to establish with cardiology in [place] re: Coumadin clinic-ambulatory referral has been sent -Patient left AMA on 08/07/2020 however came back to hospital after speaking with CT surgery on 08/08/2020. -Follow-up with Dr. Kipp Brood after discharge for routine postoperative visit -6/13 midline IV catheter placed 2/2 poor venous access IV antibiotics -6/15 T-max of 101.7 overnight and has defervesced after Tylenol.  ID made aware and recommends repeat blood cultures noting last negative cultures were on 5/7  Acute COVID-19 infection -Patient has tested positive for COVID-19 -He has no respiratory symptoms or hypoxemia-if develops symptoms will need to obtain chest x-ray and if hypoxic will need to start steroid -Begin remdesivir and if symptoms remain mild can give for only 3 days otherwise give for a total of 5 days.  Follow LFTs -Obtain inflammatory markers stat and follow daily: CK, CRP and LDH -If patient develops severe COVID with markedly  elevated inflammatory markers and/or severe pneumonia will need to discuss with ID before initiating immunosuppressant meds given presentation with bacteremia -As noted above given recent fever blood cultures were obtained on 6/15  -Continue appropriate airborne precautions-if remains relatively asymptomatic other than fever and sore throat can discontinue isolation after 10 days with today being day 1 -Has diuresed nicely and it is noticed that urine output has decreased from more than 6000 cc over 24hrs to around 3000 cc.  Discontinue oral Lasix to prevent dehydration in acute COVID  Status post recent mechanical aortic valve replacement -Continue warfarin  Fever -Treated with sore throat and lethargy-currently defervesced -As a precaution a stat COVID PCR will be obtained patient will be placed on airborne precautions until result returned -Currently without hypoxemia -Blood cultures repeated as above  Recurrent Anemia/thrombocytopenia -patient is s/p 3 units PRBC during this hospital stay.   -Transfuse PRBC as needed for hemoglobin less than 7.  -Aranesp was given until dialysis discontinued  -Etiology likely secondary to renal dysfunction, persistent significant iron deficiency and overall chronic debility -Continue niferex -6/6 **iron level 19 with a sat of 18 and ferritin 337 given 1000 mg of IV iro - hemoglobin remained stable  Atrial fibrillation/flutter with RVR -Evaluated by cardiology -Maintaining  sinus rhythm on CCB and BB  -Continue warfarin -patient will need to follow-up with cardiology in [place] to establish at their Coumadin clinic -CHA2DS2-VASc equals 3  Acute kidney injury -likely from ATN 2/2 acute infection and sepsis physiology -Last HD 5/30  -Creatinine continues to trend down and as of 6/10 is 3.93 and on 6/13 after Lasix dosing creatinine remained stable at 3.74  Polysubstance abuse -patient has amphetamine abuse, nicotine dependence, opiate dependence  on agonist therapy.   -He was on chronic Suboxone therapy at a clinic in [facility] prior to admission. -He told this Probation officer that he will no longer utilize drugs and that most recent circumstances of this admission has convinced him that the risks are too great to continue using IV drugs  Bilateral lower extremity edema -Continues to have persistent edema especially in right lower extremity and now associated with foot pain -6/9 LE duplex neg for DVT -Marked improvement in lower extremity edema after initiation of Lasix 20 mg p.o. twice daily over the weekend.  We will continue this dose and follow creatinine closely (total of 6150 cc urine output over the past 24 hours) -Likely will continue Lasix 20 p.o. daily after discharge  Obesity BMI-34.14kg/m    Data Reviewed: Basic Metabolic Panel: Recent Labs  Lab 09/01/20 0348 09/03/20 0605 09/04/20 0346  NA 139 139 137  K 4.1 4.1 3.8  CL 107 105 104  CO2 '23 24 25  '$ GLUCOSE 83 90 88  BUN 41* 36* 34*  CREATININE 3.93* 3.80* 3.74*  CALCIUM 8.9 8.7* 8.8*   Liver Function Tests: No results for input(s): AST, ALT, ALKPHOS, BILITOT, PROT, ALBUMIN in the last 168 hours.    CBC: Recent Labs  Lab 08/31/20 0433 09/03/20 0605 09/06/20 0425  WBC 6.1 6.1 4.9  HGB 8.0* 7.9* 8.3*  HCT 25.8* 25.3* 27.3*  MCV 87.8 87.5 88.1  PLT 260 281 234    BNP (last 3 results) Recent Labs    07/25/20 0334 07/26/20 0331  BNP 153.5* 76.0     Continuous Infusions:  ceFEPime (MAXIPIME) IV 2 g (09/05/20 2146)    Principal Problem:   Endocarditis Active Problems:   Opioid use disorder   Acute renal failure with oliguria (HCC)   Cerebral embolism with cerebral infarction   Anemia   Acute kidney injury (AKI) with acute tubular necrosis (ATN) (HCC)   Atrial fibrillation with RVR (HCC)   Typical atrial flutter (HCC)   Bilateral lower extremity edema   Status post mechanical aortic valve replacement   Abscess of aorta  ([place])   Consultants: Infectious disease PCCM Neurology CT surgery  Procedures: Echocardiogram TEE CRRT Cortrack  Antibiotics: Anti-infectives (From admission, onward)    Start     Dose/Rate Route Frequency Ordered Stop   09/04/20 2200  ceFEPIme (MAXIPIME) 2 g in sodium chloride 0.9 % 100 mL IVPB        2 g 200 mL/hr over 30 Minutes Intravenous Every 12 hours 09/04/20 0931 09/16/20 0959   09/03/20 1000  levofloxacin (LEVAQUIN) tablet 750 mg  Status:  Discontinued        750 mg Oral Every other day 09/02/20 1653 09/04/20 0931   09/02/20 1745  levofloxacin (LEVAQUIN) tablet 750 mg        750 mg Oral  Once 09/02/20 1653 09/02/20 1819   08/25/20 1800  ceFEPIme (MAXIPIME) 2 g in sodium chloride 0.9 % 100 mL IVPB  Status:  Discontinued        2  g 200 mL/hr over 30 Minutes Intravenous Every 12 hours 08/25/20 1618 09/02/20 1653   08/20/20 2200  ceFEPIme (MAXIPIME) 1 g in sodium chloride 0.9 % 100 mL IVPB  Status:  Discontinued        1 g 200 mL/hr over 30 Minutes Intravenous Every 24 hours 08/20/20 1523 08/25/20 1618   08/13/20 2200  ceFEPIme (MAXIPIME) 2 g in sodium chloride 0.9 % 100 mL IVPB  Status:  Discontinued        2 g 200 mL/hr over 30 Minutes Intravenous Every 24 hours 08/13/20 1154 08/20/20 1523   08/09/20 2200  ceFEPIme (MAXIPIME) 1 g in sodium chloride 0.9 % 100 mL IVPB  Status:  Discontinued        1 g 200 mL/hr over 30 Minutes Intravenous Every 24 hours 08/09/20 1238 08/13/20 1154   08/09/20 0000  ceFEPIme (MAXIPIME) 1 g in sodium chloride 0.9 % 100 mL IVPB  Status:  Discontinued        1 g 200 mL/hr over 30 Minutes Intravenous Every 24 hours 08/08/20 2305 08/09/20 1238        Time spent: 25 minutes    Erin Hearing ANP  [facility] 7 am - 330 pm/M-F for direct patient care and secure chat Please refer to Amion for contact info 29  days

## 2020-09-07 DIAGNOSIS — U071 COVID-19: Secondary | ICD-10-CM | POA: Diagnosis not present

## 2020-09-07 DIAGNOSIS — I330 Acute and subacute infective endocarditis: Secondary | ICD-10-CM | POA: Diagnosis not present

## 2020-09-07 DIAGNOSIS — Z952 Presence of prosthetic heart valve: Secondary | ICD-10-CM | POA: Diagnosis not present

## 2020-09-07 LAB — CK: Total CK: 18 U/L — ABNORMAL LOW (ref 49–397)

## 2020-09-07 LAB — CBC WITH DIFFERENTIAL/PLATELET
Abs Immature Granulocytes: 0.05 10*3/uL (ref 0.00–0.07)
Basophils Absolute: 0 10*3/uL (ref 0.0–0.1)
Basophils Relative: 0 %
Eosinophils Absolute: 0 10*3/uL (ref 0.0–0.5)
Eosinophils Relative: 0 %
HCT: 32.7 % — ABNORMAL LOW (ref 39.0–52.0)
Hemoglobin: 10 g/dL — ABNORMAL LOW (ref 13.0–17.0)
Immature Granulocytes: 1 %
Lymphocytes Relative: 26 %
Lymphs Abs: 1.8 10*3/uL (ref 0.7–4.0)
MCH: 26.7 pg (ref 26.0–34.0)
MCHC: 30.6 g/dL (ref 30.0–36.0)
MCV: 87.4 fL (ref 80.0–100.0)
Monocytes Absolute: 0.6 10*3/uL (ref 0.1–1.0)
Monocytes Relative: 9 %
Neutro Abs: 4.2 10*3/uL (ref 1.7–7.7)
Neutrophils Relative %: 64 %
Platelets: 274 10*3/uL (ref 150–400)
RBC: 3.74 MIL/uL — ABNORMAL LOW (ref 4.22–5.81)
RDW: 16.3 % — ABNORMAL HIGH (ref 11.5–15.5)
WBC: 6.6 10*3/uL (ref 4.0–10.5)
nRBC: 0 % (ref 0.0–0.2)

## 2020-09-07 LAB — HEPATIC FUNCTION PANEL
ALT: 11 U/L (ref 0–44)
AST: 20 U/L (ref 15–41)
Albumin: 2.6 g/dL — ABNORMAL LOW (ref 3.5–5.0)
Alkaline Phosphatase: 52 U/L (ref 38–126)
Bilirubin, Direct: <0.1 mg/dL (ref 0.0–0.2)
Total Bilirubin: 0.9 mg/dL (ref 0.3–1.2)
Total Protein: 8 g/dL (ref 6.5–8.1)

## 2020-09-07 LAB — PROTIME-INR
INR: 1.9 — ABNORMAL HIGH (ref 0.8–1.2)
Prothrombin Time: 21.5 seconds — ABNORMAL HIGH (ref 11.4–15.2)

## 2020-09-07 LAB — C-REACTIVE PROTEIN: CRP: 4.2 mg/dL — ABNORMAL HIGH (ref <1.0)

## 2020-09-07 LAB — LACTATE DEHYDROGENASE: LDH: 180 U/L (ref 98–192)

## 2020-09-07 LAB — D-DIMER, QUANTITATIVE: D-Dimer, Quant: 2.9 ug/mL-FEU — ABNORMAL HIGH (ref 0.00–0.50)

## 2020-09-07 MED ORDER — WARFARIN SODIUM 4 MG PO TABS
8.0000 mg | ORAL_TABLET | Freq: Once | ORAL | Status: AC
  Administered 2020-09-07: 8 mg via ORAL
  Filled 2020-09-07: qty 2

## 2020-09-07 MED ORDER — ENOXAPARIN SODIUM 120 MG/0.8ML IJ SOSY
1.0000 mg/kg | PREFILLED_SYRINGE | INTRAMUSCULAR | Status: DC
  Administered 2020-09-07 – 2020-09-10 (×4): 120 mg via SUBCUTANEOUS
  Filled 2020-09-07 (×4): qty 0.8

## 2020-09-07 MED ORDER — SODIUM CHLORIDE 0.9 % IV SOLN
100.0000 mg | Freq: Every day | INTRAVENOUS | Status: AC
  Administered 2020-09-08: 100 mg via INTRAVENOUS
  Filled 2020-09-07 (×2): qty 20

## 2020-09-07 NOTE — Progress Notes (Signed)
Pt mother is being tested for COVID, she called to see if we would accept a rapid test result.  Per Ezzie Dural, RN from infection prevention, we can accept a rapid result as long as it is from a pharmacy or urgent care.

## 2020-09-07 NOTE — Progress Notes (Signed)
ANTICOAGULATION CONSULT NOTE - Follow Up Consult  Pharmacy Consult for Warfarin >>enoxaparin Indication:  mechanical aortic valve    No Known Allergies  Patient Measurements: Height: '6\' 4"'$  (193 cm) Weight: 119.6 kg (263 lb 11.2 oz) IBW/kg (Calculated) : 86.8  Vital Signs: Temp: 99.9 F (37.7 C) (06/16 0758) Temp Source: Oral (06/16 0758) BP: 116/79 (06/16 0758) Pulse Rate: 100 (06/16 0758)  Labs: Recent Labs    09/05/20 0205 09/06/20 0425 09/06/20 1345 09/07/20 0321  HGB  --  8.3*  --  10.0*  HCT  --  27.3*  --  32.7*  PLT  --  234  --  274  LABPROT 23.6* 22.8*  --  21.5*  INR 2.1* 2.0*  --  1.9*  CREATININE  --   --  3.45*  --   CKTOTAL  --   --  16* 18*    Estimated Creatinine Clearance: 41.4 mL/min (A) (by C-G formula based on SCr of 3.45 mg/dL (H)).  Assessment: 39yom with recent endocarditis> AV root repair and Mechanical AVR on 08/03/20. Warfarin started 5/13. Left AMA 5/16 pm then returned 5/17 pm. INR 2.6 on 5/18 and Warfarin resumed. Pharmacy was consulted 5/27 (INR 1.8) to bridge with heparin until INR >2.5 again. Heparin stopped 6/1 when INR 3.1.    INR decreased to 1.9 this am, sub-therapeutic with goal INR 2.5-3.5. Hgb up to 10, PLTs WNL. No bleeding reported.     INR continues to drop, discussed with primary team will start lovenox bridge until INR trends back above goal. Avoiding heparin due to no addition access, will dose adjust given scr ~3.5 although making good uop.   Goal of Therapy:  INR 2.5-3.5  per Dr. Kipp Brood Monitor platelets by anticoagulation protocol: Yes   Plan:  Give Warfarin 8 mg tonight Lovenox '120mg'$  q24 hours Daily PT/INR CBC q72 hr, next due 6/17.  Erin Hearing PharmD., BCPS Clinical Pharmacist 09/07/2020 8:48 AM

## 2020-09-07 NOTE — Progress Notes (Signed)
[facility] PROGRESS NOTE  Alex [name] [id number] DOB: 12-30-1980 DOA: 08/08/2020 [facility]CP: [facility]  Status: Remains inpatient appropriate because:Unsafe d/c plan, IV treatments appropriate due to intensity of illness or inability to take PO and Inpatient level of care appropriate due to severity of illness. IVDA SO NOT AN APPROPRIATE CANDIDATE FOR HH ANTIBIOTICS VIA PICC LINE   Dispo: The patient is from: Home              Anticipated d/c is to: Home after rehabilitation.  Insurance declined LTAC level of care but given the need for Suboxone (which would exclude patient from going to an SNF) the physician at the [facility] is submitting a peer to peer on 6/7.  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  Discussed with ID and patient is not appropriate to transition over to oral antibiotics at this time.              Patient currently is not medically stable to d/c.   Difficult to place patient Yes   Level of care: Med-Surg  Code Status: Full Family Communication: 6/15 mother at bedside DVT prophylaxis: Warfarin plus Lovenox COVID vaccination status: Received first dose of Pfizer on 08/16/2020  HPI: 40 year old male with history of polysubstance abuse, aortic valve endocarditis, Serratia bacteremia, septic emboli causing stroke and acute kidney injury requiring hemodialysis.  Patient left hospital AMA on 08/08/2020.  He came back same night after discussion with CT surgery.  Since admission patient has improved significantly.  Renal function has returned to quint discontinuation of dialysis and hemodialysis catheter by the nephrology team.  His last set of blood cultures were negative on 5/7.  Because of poor venous access and midline catheter has been placed. 6/15 patient was found to have fevers and  sore throat and has tested positive for COVID.  He is stable from a respiratory standpoint and has mild elevation of inflammatory markers.  Plan is to administer IV remdesivir x3 days only.  Significant events:  4/26 transfer from [facility] to [facility] CT head 4/26 >> no acute findings CT abd/pelvis 4/26 >> cholelithiasis w/o inflammatory chagnes, mild hepatomegaly (25 cm), moderate splenomegaly (16.7 cm), 2 mm non obstructing kidney stone on Lt 4/27 CVL placed. Pressors escalating.  4/28 intubated for TEE, CVC, HD, and A-line placed. CRRT started  Remains on CRRT MRI on 4/30-multiple infarcts 5/4: repeat TEE shows an aortic valve abscess 5/5: extubated , self-removed HD cath so CRRT holiday> developed severe hyperkalemia overnight. 5/6: new temp HD cath placed  5/8: new temp HD cath placed after prior one self-removed by patient 5/9: Orthopantogram neg for abscess 5/10: HD cath replaced by IR [facility] 5/12 to OR for AVR, aortic root reconstruction, closure of aortic sinus track. To 2H ICU on vent post op.  4 units PRBC given intraop.  5/13 extubated, on CRRT, remains on pressors 5/18, left AMA. 5/30 continues on intermittent hemodialysis as directed by nephrology team.  Continues with excellent urinary output.  Creatinine between 2.9 and 4.5  Subjective: Alert.  Variable appetite.  No chest pain or shortness of breath.  No myalgias reported.  Objective: Vitals:   09/07/20 0312 09/07/20 0758  BP: (!) 145/97 116/79  Pulse: (!) 101 100  Resp: 18 15  Temp: 99.2 F (37.3 C) 99.9 F (37.7 C)  SpO2: 96% 96%  Intake/Output Summary (Last 24 hours) at 09/07/2020 0843 Last data filed at 09/07/2020 0646 Gross per 24 hour  Intake 1460 ml  Output 4850 ml  Net -3390 ml   Filed Weights   09/05/20 0525 09/06/20 0352 09/07/20 0647  Weight: 126.5 kg 123.8 kg 119.6 kg    Exam:  Constitutional: Alert, nontoxic in appearance, no acute distress Respiratory: Clear to auscultation anteriorly.   Stable on room air with resting O2 sats 96% and no exertional hypoxemia when checked. Cardiac: S1-S2, only trace peripheral edema, normotensive. Abdomen:  LBM 6/14, tender nondistended with normoactive bowel sounds Genitourinary: Voids easily Neurologic: CN 2-12 grossly intact. Sensation intact, DTR normal. Strength 5/5 x all 4 extremities.  Psychiatric: Alert and oriented x3 with pleasant affect.   Assessment/Plan: Acute problems: Aortic valve endocarditis with perivalvular abscess-Serratia bacteremia, septic emboli causing strokes.   -Presented with sepsis physiology required short-term mechanical ventilation   -MRI brain (07/22/2020): multiple acute infarcts.   -Blood cultures: Serratia marcescens -TEE: severe AI and AR with AV vegetation    -S/p mechanical aortic valve replacement, aortic root construction and closure of aortic sinus tract.   -Needs 6 weeks of cefepime per ID with end date 09/15/20  -Coumadin per pharmacy.  Will need to establish with cardiology in [place] re: Coumadin clinic-ambulatory referral has been sent -6/16 downward trend of INR-suspect secondary to hypercoagulable state from acute COVID infection.  Spoke with pharmacist who recommended bridging with full dose Lovenox -Follow-up with Dr. Kipp Brood after discharge for routine postoperative visit   Acute COVID-19 infection -Patient has tested positive for COVID-19 on 6/15 -O2 sats 96%; TM 99.9 -Mild COVID without hypoxemia -remdesivir for 3 days- Follow LFTs -CRP trend: 3.4 > 4.2 -LDH WNLs -D dimer: 2.9 -Platelets are WNLs  Status post recent mechanical aortic valve replacement -Continue warfarin  Recurrent Anemia/thrombocytopenia -patient is s/p 3 units PRBC during this hospital stay.   -Hemoglobin stable-infuse only if hemoglobin<7 -Continue oral iron; will discharge-was also given a dose of IV iron this admission -Platelets are normal  Atrial fibrillation/flutter with RVR -Evaluated by  cardiology -Maintaining sinus rhythm on CCB and BB  -Continue warfarin -patient will need to follow-up with cardiology in [place] to establish at their Coumadin clinic  Acute kidney injury -likely from ATN 2/2 acute infection and sepsis physiology -Last HD 5/30  -Creatinine stable 3.45  Polysubstance abuse -patient has amphetamine abuse, nicotine dependence, opiate dependence on agonist therapy.   -He was on chronic Suboxone therapy at a clinic in [facility] prior to admission. -He told this Probation officer that he will no longer utilize drugs and that most recent circumstances of this admission has convinced him that the risks are too great to continue using IV drugs  Bilateral lower extremity edema -Improved after given several days of twice daily oral Lasix-Lasix placed on hold in context of acute COVID infection  Obesity BMI-34.14kg/m    Data Reviewed: Basic Metabolic Panel: Recent Labs  Lab 09/01/20 0348 09/03/20 0605 09/04/20 0346 09/06/20 1345  NA 139 139 137 136  K 4.1 4.1 3.8 3.7  CL 107 105 104 100  CO2 '23 24 25 27  '$ GLUCOSE 83 90 88 90  BUN 41* 36* 34* 28*  CREATININE 3.93* 3.80* 3.74* 3.45*  CALCIUM 8.9 8.7* 8.8* 8.7*   Liver Function Tests: Recent Labs  Lab 09/06/20 1345 09/07/20 0321  AST 14* 20  ALT 8 11  ALKPHOS 45 52  BILITOT 0.5 0.9  PROT 7.2 8.0  ALBUMIN 2.4*  2.6*      CBC: Recent Labs  Lab 09/03/20 0605 09/06/20 0425 09/07/20 0321  WBC 6.1 4.9 6.6  NEUTROABS  --   --  4.2  HGB 7.9* 8.3* 10.0*  HCT 25.3* 27.3* 32.7*  MCV 87.5 88.1 87.4  PLT 281 234 274    BNP (last 3 results) Recent Labs    07/25/20 0334 07/26/20 0331  BNP 153.5* 76.0     Continuous Infusions:  ceFEPime (MAXIPIME) IV 2 g (09/06/20 2150)   remdesivir 100 mg in NS 100 mL      Principal Problem:   Endocarditis Active Problems:   Opioid use disorder   Acute renal failure with oliguria (HCC)   Cerebral embolism with cerebral infarction    Anemia   Acute kidney injury (AKI) with acute tubular necrosis (ATN) (HCC)   Atrial fibrillation with RVR (HCC)   Typical atrial flutter (HCC)   Bilateral lower extremity edema   Status post mechanical aortic valve replacement   Abscess of aorta (HCC)   Fever, unspecified   Consultants: Infectious disease PCCM Neurology CT surgery  Procedures: Echocardiogram TEE CRRT Cortrack  Antibiotics: Anti-infectives (From admission, onward)    Start     Dose/Rate Route Frequency Ordered Stop   09/07/20 1000  remdesivir 100 mg in sodium chloride 0.9 % 100 mL IVPB       See Hyperspace for full Linked Orders Report.   100 mg 200 mL/hr over 30 Minutes Intravenous Daily 09/06/20 1328 09/11/20 0959   09/06/20 1430  remdesivir 200 mg in sodium chloride 0.9% 250 mL IVPB       See Hyperspace for full Linked Orders Report.   200 mg 580 mL/hr over 30 Minutes Intravenous Once 09/06/20 1328 09/06/20 1918   09/04/20 2200  ceFEPIme (MAXIPIME) 2 g in sodium chloride 0.9 % 100 mL IVPB        2 g 200 mL/hr over 30 Minutes Intravenous Every 12 hours 09/04/20 0931 09/16/20 0959   09/03/20 1000  levofloxacin (LEVAQUIN) tablet 750 mg  Status:  Discontinued        750 mg Oral Every other day 09/02/20 1653 09/04/20 0931   09/02/20 1745  levofloxacin (LEVAQUIN) tablet 750 mg        750 mg Oral  Once 09/02/20 1653 09/02/20 1819   08/25/20 1800  ceFEPIme (MAXIPIME) 2 g in sodium chloride 0.9 % 100 mL IVPB  Status:  Discontinued        2 g 200 mL/hr over 30 Minutes Intravenous Every 12 hours 08/25/20 1618 09/02/20 1653   08/20/20 2200  ceFEPIme (MAXIPIME) 1 g in sodium chloride 0.9 % 100 mL IVPB  Status:  Discontinued        1 g 200 mL/hr over 30 Minutes Intravenous Every 24 hours 08/20/20 1523 08/25/20 1618   08/13/20 2200  ceFEPIme (MAXIPIME) 2 g in sodium chloride 0.9 % 100 mL IVPB  Status:  Discontinued        2 g 200 mL/hr over 30 Minutes Intravenous Every 24 hours 08/13/20 1154 08/20/20 1523    08/09/20 2200  ceFEPIme (MAXIPIME) 1 g in sodium chloride 0.9 % 100 mL IVPB  Status:  Discontinued        1 g 200 mL/hr over 30 Minutes Intravenous Every 24 hours 08/09/20 1238 08/13/20 1154   08/09/20 0000  ceFEPIme (MAXIPIME) 1 g in sodium chloride 0.9 % 100 mL IVPB  Status:  Discontinued        1 g 200  mL/hr over 30 Minutes Intravenous Every 24 hours 08/08/20 2305 08/09/20 1238        Time spent: 25 minutes    Erin Hearing ANP  [facility] 7 am - 330 pm/M-F for direct patient care and secure chat Please refer to Amion for contact info 30  days

## 2020-09-08 ENCOUNTER — Other Ambulatory Visit (HOSPITAL_COMMUNITY): Payer: 59

## 2020-09-08 DIAGNOSIS — I330 Acute and subacute infective endocarditis: Secondary | ICD-10-CM | POA: Diagnosis not present

## 2020-09-08 DIAGNOSIS — N170 Acute kidney failure with tubular necrosis: Secondary | ICD-10-CM | POA: Diagnosis not present

## 2020-09-08 DIAGNOSIS — R600 Localized edema: Secondary | ICD-10-CM | POA: Diagnosis not present

## 2020-09-08 DIAGNOSIS — U071 COVID-19: Secondary | ICD-10-CM | POA: Diagnosis not present

## 2020-09-08 LAB — CBC
HCT: 30.3 % — ABNORMAL LOW (ref 39.0–52.0)
Hemoglobin: 9.3 g/dL — ABNORMAL LOW (ref 13.0–17.0)
MCH: 26.7 pg (ref 26.0–34.0)
MCHC: 30.7 g/dL (ref 30.0–36.0)
MCV: 87.1 fL (ref 80.0–100.0)
Platelets: 217 10*3/uL (ref 150–400)
RBC: 3.48 MIL/uL — ABNORMAL LOW (ref 4.22–5.81)
RDW: 16.5 % — ABNORMAL HIGH (ref 11.5–15.5)
WBC: 4.5 10*3/uL (ref 4.0–10.5)
nRBC: 0 % (ref 0.0–0.2)

## 2020-09-08 LAB — C-REACTIVE PROTEIN: CRP: 4.4 mg/dL — ABNORMAL HIGH (ref <1.0)

## 2020-09-08 LAB — CK: Total CK: 14 U/L — ABNORMAL LOW (ref 49–397)

## 2020-09-08 LAB — HEPATIC FUNCTION PANEL
ALT: 11 U/L (ref 0–44)
AST: 24 U/L (ref 15–41)
Albumin: 2.3 g/dL — ABNORMAL LOW (ref 3.5–5.0)
Alkaline Phosphatase: 45 U/L (ref 38–126)
Bilirubin, Direct: 0.1 mg/dL (ref 0.0–0.2)
Indirect Bilirubin: 0.9 mg/dL (ref 0.3–0.9)
Total Bilirubin: 1 mg/dL (ref 0.3–1.2)
Total Protein: 7.1 g/dL (ref 6.5–8.1)

## 2020-09-08 LAB — D-DIMER, QUANTITATIVE: D-Dimer, Quant: 2.57 ug/mL-FEU — ABNORMAL HIGH (ref 0.00–0.50)

## 2020-09-08 LAB — PROTIME-INR
INR: 2.1 — ABNORMAL HIGH (ref 0.8–1.2)
Prothrombin Time: 23.9 seconds — ABNORMAL HIGH (ref 11.4–15.2)

## 2020-09-08 LAB — LACTATE DEHYDROGENASE: LDH: 173 U/L (ref 98–192)

## 2020-09-08 MED ORDER — WARFARIN SODIUM 5 MG PO TABS
6.0000 mg | ORAL_TABLET | Freq: Once | ORAL | Status: AC
  Administered 2020-09-08: 6 mg via ORAL
  Filled 2020-09-08: qty 1

## 2020-09-08 NOTE — Progress Notes (Signed)
[facility] PROGRESS NOTE  Alex [name] [id number] DOB: 04-27-1980 DOA: 08/08/2020 PCP: [facility]  Status: Remains inpatient appropriate because:Unsafe d/c plan, IV treatments appropriate due to intensity of illness or inability to take PO and Inpatient level of care appropriate due to severity of illness. IVDA SO NOT AN APPROPRIATE CANDIDATE FOR HH ANTIBIOTICS VIA PICC LINE   Dispo: The patient is from: Home              Anticipated d/c is to: Home 6/25              Patient currently is not medically stable to d/c.   Difficult to place patient Yes   Level of care: Med-Surg  Code Status: Full Family Communication: 6/15 mother at bedside DVT prophylaxis: Warfarin plus Lovenox COVID vaccination status: Received first dose of Pfizer on 08/16/2020-he developed acute COVID infection during hospitalization therefore will need to wait 3 more months before receiving second vaccination  HPI: 40 year old male with history of polysubstance abuse, aortic valve endocarditis, Serratia bacteremia, septic emboli causing stroke and acute kidney injury requiring hemodialysis.  Patient left hospital AMA on 08/08/2020.  He came back same night after discussion with CT surgery.  Since admission patient has improved significantly.  Renal function has returned to quint discontinuation of dialysis and hemodialysis catheter by the nephrology team.  His last set of blood cultures were negative on 5/7.  Because of poor venous access and midline catheter has been placed. 6/15 patient was found to have fevers and sore throat and has tested positive for COVID.  He is stable from a respiratory standpoint and has mild elevation of inflammatory markers.  Plan is to administer IV remdesivir x3 days only.  Significant events:  4/26 transfer from [facility] CT head 4/26 >> no acute findings CT abd/pelvis 4/26 >> cholelithiasis w/o inflammatory chagnes, mild hepatomegaly (25  cm), moderate splenomegaly (16.7 cm), 2 mm non obstructing kidney stone on Lt 4/27 CVL placed. Pressors escalating.  4/28 intubated for TEE, CVC, HD, and A-line placed. CRRT started  Remains on CRRT MRI on 4/30-multiple infarcts 5/4: repeat TEE shows an aortic valve abscess 5/5: extubated , self-removed HD cath so CRRT holiday> developed severe hyperkalemia overnight. 5/6: new temp HD cath placed  5/8: new temp HD cath placed after prior one self-removed by patient 5/9: Orthopantogram neg for abscess 5/10: HD cath replaced by IR [facility] 5/12 to OR for AVR, aortic root reconstruction, closure of aortic sinus track. To 2H ICU on vent post op.  4 units PRBC given intraop.  5/13 extubated, on CRRT, remains on pressors 5/18, left AMA. 5/30 continues on intermittent hemodialysis as directed by nephrology team.  Continues with excellent urinary output.  Creatinine between 2.9 and 4.5  Subjective: Alert and ambulating around room.  Denies pleuritic chest pain or trouble breathing.  Denies myalgias.  Had questions regarding quarantine process since he will be discharging 24 hours prior to quarantine completion.  Objective: Vitals:   09/07/20 2316 09/08/20 0348  BP: 140/81 (!) 129/93  Pulse: 92 94  Resp: 18 18  Temp: 98.5 F (36.9 C) 98.9 F (37.2 C)  SpO2: 96% 98%    Intake/Output Summary (Last 24 hours) at 09/08/2020 0825 Last data filed at 09/08/2020 0500 Gross per 24 hour  Intake 790 ml  Output 2300 ml  Net -1510 ml   Filed Weights   09/06/20 0352 09/07/20 0647 09/08/20 0500  Weight: 123.8 kg 119.6 kg 117.3 kg  Exam:  Constitutional: Alert, calm, no acute distress Respiratory: Lungs remain clear to auscultation, he is stable on room air without any hypoxemia Cardiac: S1-S2, previous marked peripheral edema has completely resolved, Ace wraps are in place, normotensive, no tachycardia Abdomen:  LBM 6/16, soft nontender nondistended.  Patient denying anorexia or abdominal  pain. Genitourinary: Voids easily Neurologic: CN 2-12 grossly intact. Sensation intact, DTR normal. Strength 5/5 x all 4 extremities.  Psychiatric: Alert and oriented x3.  Pleasant affect.   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   -Presented with sepsis physiology required short-term mechanical ventilation   -MRI brain (07/22/2020): multiple acute infarcts.   -Blood cultures: Serratia marcescens -TEE: severe AI and AR with AV vegetation    -S/p mechanical aortic valve replacement, aortic root construction and closure of aortic sinus tract.   -Needs 6 weeks of cefepime per ID with end date 09/15/20  -Coumadin per pharmacy.  Will need to establish with cardiology in [place] re: Coumadin clinic-ambulatory referral has been sent -INR back up to 2.1 but still low re mech heart valave-cont Lovenox bridge -Follow-up with Dr. Kipp Brood after discharge for routine postoperative visit   Acute COVID-19 infection -Patient has tested positive for COVID-19 on 6/15-anticipate 10-day quarantine with last date 6/25 -O2 sats 96%; TM 99.9 -Mild COVID without hypoxemia -remdesivir for 3 days- Follow LFTs -CRP trend: 3.4 > 4.2>4.4 -LDH WNLs -D dimer: 2.9>2.57 -Platelets are WNLs  Status post recent mechanical aortic valve replacement -Continue warfarin  Recurrent Anemia/thrombocytopenia -patient is s/p 3 units PRBC during this hospital stay.   -Hemoglobin stable-infuse only if hemoglobin<7 -Continue oral iron; will discharge-was also given a dose of IV iron this admission -Platelets are normal  Atrial fibrillation/flutter with RVR -Evaluated by cardiology -Maintaining sinus rhythm on CCB and BB  -Continue warfarin -patient will need to follow-up with cardiology in [place] to establish at their Coumadin clinic  Acute kidney injury -likely from ATN 2/2 acute infection and sepsis  physiology -Last HD 5/30  -Creatinine stable 3.45 on 6/15-we will check electrolyte panel tomorrow on 6/18  Polysubstance abuse -patient has amphetamine abuse, nicotine dependence, opiate dependence on agonist therapy.   -He was on chronic Suboxone therapy at a clinic in [facility] prior to admission. -He told this Probation officer that he will no longer utilize drugs and that most recent circumstances of this admission has convinced him that the risks are too great to continue using IV drugs  Bilateral lower extremity edema -Improved after given several days of twice daily oral Lasix-Lasix placed on hold in context of acute COVID infection  Obesity BMI-34.14kg/m    Data Reviewed: Basic Metabolic Panel: Recent Labs  Lab 09/03/20 0605 09/04/20 0346 09/06/20 1345  NA 139 137 136  K 4.1 3.8 3.7  CL 105 104 100  CO2 '24 25 27  '$ GLUCOSE 90 88 90  BUN 36* 34* 28*  CREATININE 3.80* 3.74* 3.45*  CALCIUM 8.7* 8.8* 8.7*   Liver Function Tests: Recent Labs  Lab 09/06/20 1345 09/07/20 0321 09/08/20 0345  AST 14* 20 24  ALT '8 11 11  '$ ALKPHOS 45 52 45  BILITOT 0.5 0.9 1.0  PROT 7.2 8.0 7.1  ALBUMIN 2.4* 2.6* 2.3*      CBC: Recent Labs  Lab 09/03/20 0605 09/06/20 0425 09/07/20 0321 09/08/20 0345  WBC 6.1 4.9 6.6 4.5  NEUTROABS  --   --  4.2  --   HGB 7.9* 8.3* 10.0*  9.3*  HCT 25.3* 27.3* 32.7* 30.3*  MCV 87.5 88.1 87.4 87.1  PLT 281 234 274 217    BNP (last 3 results) Recent Labs    07/25/20 0334 07/26/20 0331  BNP 153.5* 76.0     Continuous Infusions:  ceFEPime (MAXIPIME) IV 2 g (09/07/20 2108)   remdesivir 100 mg in NS 100 mL      Principal Problem:   Endocarditis Active Problems:   Opioid use disorder   Acute renal failure with oliguria (HCC)   Cerebral embolism with cerebral infarction   Anemia   Acute kidney injury (AKI) with acute tubular necrosis (ATN) (HCC)   Atrial fibrillation with RVR (HCC)   Typical atrial flutter (HCC)    Bilateral lower extremity edema   Status post mechanical aortic valve replacement   Abscess of aorta (HCC)   Fever, unspecified   Acute COVID-19   Consultants: Infectious disease PCCM Neurology CT surgery  Procedures: Echocardiogram TEE CRRT Cortrack  Antibiotics: Anti-infectives (From admission, onward)    Start     Dose/Rate Route Frequency Ordered Stop   09/07/20 1051  remdesivir 100 mg in sodium chloride 0.9 % 100 mL IVPB       See Hyperspace for full Linked Orders Report.   100 mg 200 mL/hr over 30 Minutes Intravenous Daily 09/07/20 1051 09/09/20 0959   09/07/20 1000  remdesivir 100 mg in sodium chloride 0.9 % 100 mL IVPB  Status:  Discontinued       See Hyperspace for full Linked Orders Report.   100 mg 200 mL/hr over 30 Minutes Intravenous Daily 09/06/20 1328 09/07/20 1051   09/06/20 1430  remdesivir 200 mg in sodium chloride 0.9% 250 mL IVPB       See Hyperspace for full Linked Orders Report.   200 mg 580 mL/hr over 30 Minutes Intravenous Once 09/06/20 1328 09/06/20 1918   09/04/20 2200  ceFEPIme (MAXIPIME) 2 g in sodium chloride 0.9 % 100 mL IVPB        2 g 200 mL/hr over 30 Minutes Intravenous Every 12 hours 09/04/20 0931 09/16/20 0959   09/03/20 1000  levofloxacin (LEVAQUIN) tablet 750 mg  Status:  Discontinued        750 mg Oral Every other day 09/02/20 1653 09/04/20 0931   09/02/20 1745  levofloxacin (LEVAQUIN) tablet 750 mg        750 mg Oral  Once 09/02/20 1653 09/02/20 1819   08/25/20 1800  ceFEPIme (MAXIPIME) 2 g in sodium chloride 0.9 % 100 mL IVPB  Status:  Discontinued        2 g 200 mL/hr over 30 Minutes Intravenous Every 12 hours 08/25/20 1618 09/02/20 1653   08/20/20 2200  ceFEPIme (MAXIPIME) 1 g in sodium chloride 0.9 % 100 mL IVPB  Status:  Discontinued        1 g 200 mL/hr over 30 Minutes Intravenous Every 24 hours 08/20/20 1523 08/25/20 1618   08/13/20 2200  ceFEPIme (MAXIPIME) 2 g in sodium chloride 0.9 % 100 mL IVPB  Status:  Discontinued         2 g 200 mL/hr over 30 Minutes Intravenous Every 24 hours 08/13/20 1154 08/20/20 1523   08/09/20 2200  ceFEPIme (MAXIPIME) 1 g in sodium chloride 0.9 % 100 mL IVPB  Status:  Discontinued        1 g 200 mL/hr over 30 Minutes Intravenous Every 24 hours 08/09/20 1238 08/13/20 1154   08/09/20 0000  ceFEPIme (MAXIPIME) 1 g in sodium  chloride 0.9 % 100 mL IVPB  Status:  Discontinued        1 g 200 mL/hr over 30 Minutes Intravenous Every 24 hours 08/08/20 2305 08/09/20 1238        Time spent: 25 minutes    Erin Hearing ANP  [facility] 7 am - 330 pm/M-F for direct patient care and secure chat Please refer to Amion for contact info 31  days

## 2020-09-08 NOTE — Progress Notes (Signed)
ANTICOAGULATION CONSULT NOTE - Follow Up Consult  Pharmacy Consult for Warfarin and Lovenox Indication:  mechanical aortic valve    No Known Allergies  Patient Measurements: Height: '6\' 4"'$  (193 cm) Weight: 117.3 kg (258 lb 8 oz) IBW/kg (Calculated) : 86.8  Vital Signs: Temp: 98.9 F (37.2 C) (06/17 0348) Temp Source: Oral (06/17 0348) BP: 129/93 (06/17 0348) Pulse Rate: 94 (06/17 0348)  Labs: Recent Labs    09/06/20 0425 09/06/20 1345 09/07/20 0321 09/08/20 0345  HGB 8.3*  --  10.0* 9.3*  HCT 27.3*  --  32.7* 30.3*  PLT 234  --  274 217  LABPROT 22.8*  --  21.5* 23.9*  INR 2.0*  --  1.9* 2.1*  CREATININE  --  3.45*  --   --   CKTOTAL  --  16* 18* 14*    Estimated Creatinine Clearance: 41.1 mL/min (A) (by C-G formula based on SCr of 3.45 mg/dL (H)).  Assessment: 39yom with recent endocarditis> AV root repair and Mechanical AVR on 08/03/20. Warfarin started 5/13. Left AMA 5/16 pm then returned 5/17 pm. INR 2.6 on 5/18 and Warfarin resumed. Pharmacy was consulted 5/27 (INR 1.8) to bridge with heparin until INR >2.5 again. Heparin stopped 6/1 when INR 3.1.    INR sub-therapeutic at 2.1 with goal INR 2.5-3.5; also on Lovenox bridge until INR >= 2.5. Hgb 9.3 (low/stable), PLTs WNL. No bleeding reported.     INR drop likely due to lower dose given on 6/12.  Goal of Therapy:  INR 2.5-3.5  per Dr. Kipp Brood Monitor platelets by anticoagulation protocol: Yes   Plan:  Give Warfarin 6 mg again tonight Lovenox 120 mg SQ q24h until INR >= 2.5 Daily PT/INR CBC q72 hr, next due 6/18 Monitor for s/sx of bleeding   Thank you for involving pharmacy in this patient's care.  Renold Genta, PharmD, BCPS Clinical Pharmacist Clinical phone for 09/08/2020 until 3p is [phone] 09/08/2020 7:32 AM  **Pharmacist phone directory can be found on [email or web address] listed under [place]**

## 2020-09-08 NOTE — Progress Notes (Signed)
PT Cancellation Note  Patient Details Name: LEMANUEL FUKUDA MRN: [id number] DOB: 04-May-1980   Cancelled Treatment:    Reason Eval/Treat Not Completed: (P) Other (comment) (pt reports foot pain improved and no difficulty with ambulation, wanting to rest.) Will continue to follow per PT POC, pt may be able to clear acute PT in 1-2 more sessions.   Kara Pacer Yannely Kintzel 09/08/2020, 4:37 PM

## 2020-09-09 DIAGNOSIS — U071 COVID-19: Secondary | ICD-10-CM | POA: Diagnosis not present

## 2020-09-09 DIAGNOSIS — I330 Acute and subacute infective endocarditis: Secondary | ICD-10-CM | POA: Diagnosis not present

## 2020-09-09 LAB — BASIC METABOLIC PANEL
Anion gap: 11 (ref 5–15)
BUN: 31 mg/dL — ABNORMAL HIGH (ref 6–20)
CO2: 27 mmol/L (ref 22–32)
Calcium: 8.6 mg/dL — ABNORMAL LOW (ref 8.9–10.3)
Chloride: 102 mmol/L (ref 98–111)
Creatinine, Ser: 3.24 mg/dL — ABNORMAL HIGH (ref 0.61–1.24)
GFR, Estimated: 24 mL/min — ABNORMAL LOW (ref >60)
Glucose, Bld: 76 mg/dL (ref 70–99)
Potassium: 3.3 mmol/L — ABNORMAL LOW (ref 3.5–5.1)
Sodium: 140 mmol/L (ref 135–145)

## 2020-09-09 LAB — HEPATIC FUNCTION PANEL
ALT: 11 U/L (ref 0–44)
AST: 26 U/L (ref 15–41)
Albumin: 2.4 g/dL — ABNORMAL LOW (ref 3.5–5.0)
Alkaline Phosphatase: 43 U/L (ref 38–126)
Bilirubin, Direct: 0.1 mg/dL (ref 0.0–0.2)
Indirect Bilirubin: 0.8 mg/dL (ref 0.3–0.9)
Total Bilirubin: 0.9 mg/dL (ref 0.3–1.2)
Total Protein: 7.1 g/dL (ref 6.5–8.1)

## 2020-09-09 LAB — C-REACTIVE PROTEIN: CRP: 3 mg/dL — ABNORMAL HIGH (ref <1.0)

## 2020-09-09 LAB — CK: Total CK: 11 U/L — ABNORMAL LOW (ref 49–397)

## 2020-09-09 LAB — PROTIME-INR
INR: 2.3 — ABNORMAL HIGH (ref 0.8–1.2)
Prothrombin Time: 25 seconds — ABNORMAL HIGH (ref 11.4–15.2)

## 2020-09-09 LAB — D-DIMER, QUANTITATIVE: D-Dimer, Quant: 2.47 ug/mL-FEU — ABNORMAL HIGH (ref 0.00–0.50)

## 2020-09-09 LAB — LACTATE DEHYDROGENASE: LDH: 174 U/L (ref 98–192)

## 2020-09-09 MED ORDER — POTASSIUM CHLORIDE CRYS ER 20 MEQ PO TBCR
20.0000 meq | EXTENDED_RELEASE_TABLET | Freq: Once | ORAL | Status: AC
  Administered 2020-09-09: 20 meq via ORAL
  Filled 2020-09-09: qty 1

## 2020-09-09 MED ORDER — WARFARIN SODIUM 4 MG PO TABS
8.0000 mg | ORAL_TABLET | Freq: Once | ORAL | Status: AC
  Administered 2020-09-09: 8 mg via ORAL
  Filled 2020-09-09: qty 2

## 2020-09-09 NOTE — Progress Notes (Signed)
ANTICOAGULATION CONSULT NOTE - Follow Up Consult  Pharmacy Consult for Warfarin and Lovenox Indication:  mechanical aortic valve    No Known Allergies  Patient Measurements: Height: '6\' 4"'$  (193 cm) Weight: 117.3 kg (258 lb 8 oz) IBW/kg (Calculated) : 86.8  Vital Signs: Temp: 98.6 F (37 C) (06/18 0531) Temp Source: Oral (06/18 0531) BP: 127/89 (06/18 0531) Pulse Rate: 87 (06/18 0531)  Labs: Recent Labs    09/06/20 1345 09/07/20 0321 09/08/20 0345 09/09/20 0500  HGB  --  10.0* 9.3*  --   HCT  --  32.7* 30.3*  --   PLT  --  274 217  --   LABPROT  --  21.5* 23.9* 25.0*  INR  --  1.9* 2.1* 2.3*  CREATININE 3.45*  --   --  3.24*  CKTOTAL 16* 18* 14* 11*   Estimated Creatinine Clearance: 43.7 mL/min (A) (by C-G formula based on SCr of 3.24 mg/dL (H)).  Assessment: 39yom with recent endocarditis> AV root repair and Mechanical AVR on 08/03/20. Warfarin started 5/13. Left AMA 5/16 pm then returned 5/17 pm. INR 2.6 on 5/18 and Warfarin resumed. Pharmacy was consulted 5/27 (INR 1.8) to bridge with heparin until INR >2.5 again. Heparin stopped 6/1 when INR 3.1.    INR sub-therapeutic at 2.3 with goal INR 2.5-3.5; also on Lovenox bridge until INR >= 2.5. Hgb 9.3 (low/stable), PLTs WNL. No bleeding reported. Has received around '6mg'$  for 4 days. Will give slightly higher dose tonight to facilitate therapeutic INR  Goal of Therapy:  INR 2.5-3.5  per Dr. Kipp Brood Monitor platelets by anticoagulation protocol: Yes   Plan:  Give Warfarin 8 mg tonight Lovenox 120 mg SQ q24h until INR >= 2.5 Daily PT/INR CBC q72 hr Monitor for s/sx of bleeding   Thank you for involving pharmacy in this patient's care.  Norina Buzzard, PharmD PGY1 Pharmacy Resident 09/09/2020 8:37 AM   **Pharmacist phone directory can be found on [email or web address] listed under [place]**

## 2020-09-09 NOTE — Progress Notes (Signed)
PROGRESS NOTE    Alex [name]  [id number] DOB: 10/21/1980 DOA: 08/08/2020 PCP: Loraine Maple [facility]    Chief Complaint  Patient presents with   Post -Op Open Heart Surgery    Brief Narrative:  40 year old male with history of polysubstance abuse, aortic valve endocarditis, Serratia bacteremia, septic emboli causing stroke and acute kidney injury requiring hemodialysis.  Patient left hospital AMA on 08/08/2020.  He came back same night after discussion with CT surgery.   Since admission patient has improved significantly.  Renal function has returned to quint discontinuation of dialysis and hemodialysis catheter by the nephrology team.  His last set of blood cultures were negative on 5/7.  Because of poor venous access and midline catheter has been placed. 6/15 patient was found to have fevers and sore throat and has tested positive for COVID.  He is stable from a respiratory standpoint and has mild elevation of inflammatory markers.   completed Remdesivir x3 days only.  Assessment & Plan:   Principal Problem:   Endocarditis Active Problems:   Opioid use disorder   Acute renal failure with oliguria (HCC)   Cerebral embolism with cerebral infarction   Anemia   Acute kidney injury (AKI) with acute tubular necrosis (ATN) (HCC)   Atrial fibrillation with RVR (HCC)   Typical atrial flutter (HCC)   Bilateral lower extremity edema   Status post mechanical aortic valve replacement   Abscess of aorta (HCC)   Fever, unspecified   Acute COVID-19   Aortic valve endocarditis with perivalvular abscess , aortic root abscess Growing serratia bacteremia Septic emboli causing strokes. MRI brain- multiple infarcts.  Blood cultures growing serrativa marcescens.  S/p mechanical aortic valve replacement, aortic root construction and closure of aortic sinus tract. Continue with cefepime with end date 09/15/20. Recommend outpatient follow up with Dr Kipp Brood.   Continue with Lovenox and coumadin.    Acute COVID 19 injection:  Asymptomatic.     Anemia of acute illness/ thrombocytopenia: - s/p 3 units of prbc transfusion. Currently on darbopoetin.  Transfuse to keep hemoglobin greater than 7.    AKI  SEC to ATN from infection  Slowing improving creatinine.    Polysubstance abuse:  On chronic suboxone therapy.  Follows up with pain clinic.     Bilateral lower extremity edema:  Improved.    Body mass index is 31.47 kg/m. Obesity    Mild nausea:  - unclear etiology - prn zofran.    Hypokalemia Replaced.    DVT prophylaxis: (Lovenox/) Code Status: (Full code) Family Communication: none at bedside.  Disposition:   Status is: Inpatient  Remains inpatient appropriate because:Unsafe d/c plan and IV treatments appropriate due to intensity of illness or inability to take PO  Dispo: The patient is from: Home              Anticipated d/c is to: Home              Patient currently is not medically stable to d/c.   Difficult to place patient No       Consultants:  None.   Procedures: none.   Antimicrobials:  Antibiotics Given (last 72 hours)     Date/Time Action Medication Dose Rate   09/06/20 1848 New Bag/Given   remdesivir 200 mg in sodium chloride 0.9% 250 mL IVPB 200 mg 580 mL/hr   09/06/20 2150 New Bag/Given   ceFEPIme (MAXIPIME) 2 g in sodium chloride 0.9 % 100 mL IVPB 2 g 200 mL/hr  09/07/20 1010 New Bag/Given   ceFEPIme (MAXIPIME) 2 g in sodium chloride 0.9 % 100 mL IVPB 2 g 200 mL/hr   09/07/20 1219 New Bag/Given   remdesivir 100 mg in sodium chloride 0.9 % 100 mL IVPB 100 mg 200 mL/hr   09/07/20 2108 New Bag/Given   ceFEPIme (MAXIPIME) 2 g in sodium chloride 0.9 % 100 mL IVPB 2 g 200 mL/hr   09/08/20 1044 New Bag/Given   remdesivir 100 mg in sodium chloride 0.9 % 100 mL IVPB 100 mg 200 mL/hr   09/08/20 1219 New Bag/Given   ceFEPIme (MAXIPIME) 2 g in sodium chloride 0.9 % 100 mL IVPB 2 g 200  mL/hr   09/08/20 2247 New Bag/Given   ceFEPIme (MAXIPIME) 2 g in sodium chloride 0.9 % 100 mL IVPB 2 g 200 mL/hr   09/09/20 [id number] New Bag/Given   ceFEPIme (MAXIPIME) 2 g in sodium chloride 0.9 % 100 mL IVPB 2 g 200 mL/hr        Subjective: No new complaints.   Objective: Vitals:   09/09/20 0531 09/09/20 0913 09/09/20 1220 09/09/20 1659  BP: 127/89 (!) 126/96 (!) 133/91 105/73  Pulse: 87 83 (!) 105 90  Resp: '16 15 16 14  '$ Temp: 98.6 F (37 C) 98.3 F (36.8 C) (!) 97.5 F (36.4 C) 97.9 F (36.6 C)  TempSrc: Oral Oral Oral Oral  SpO2: 96% 95% 97% 95%  Weight:      Height:        Intake/Output Summary (Last 24 hours) at 09/09/2020 1722 Last data filed at 09/09/2020 1653 Gross per 24 hour  Intake 720 ml  Output 3900 ml  Net -3180 ml   Filed Weights   09/06/20 0352 09/07/20 0647 09/08/20 0500  Weight: 123.8 kg 119.6 kg 117.3 kg    Examination:  General exam: Appears calm and comfortable  Respiratory system: Clear to auscultation. Respiratory effort normal. Cardiovascular system: S1 & S2 heard, RRR. No JVD,  No pedal edema. Gastrointestinal system: Abdomen is nondistended, soft and nontender.Normal bowel sounds heard. Central nervous system: Alert and oriented. No focal neurological deficits. Extremities: Symmetric 5 x 5 power. Skin: No rashes, lesions or ulcers Psychiatry: Mood & affect appropriate.     Data Reviewed: I have personally reviewed following labs and imaging studies  CBC: Recent Labs  Lab 09/03/20 0605 09/06/20 0425 09/07/20 0321 09/08/20 0345  WBC 6.1 4.9 6.6 4.5  NEUTROABS  --   --  4.2  --   HGB 7.9* 8.3* 10.0* 9.3*  HCT 25.3* 27.3* 32.7* 30.3*  MCV 87.5 88.1 87.4 87.1  PLT 281 234 274 [id number]    Basic Metabolic Panel: Recent Labs  Lab 09/03/20 0605 09/04/20 0346 09/06/20 1345 09/09/20 0500  NA 139 137 136 140  K 4.1 3.8 3.7 3.3*  CL 105 104 100 102  CO2 '24 25 27 27  '$ GLUCOSE 90 88 90 76  BUN 36* 34* 28* 31*  CREATININE 3.80* 3.74*  3.45* 3.24*  CALCIUM 8.7* 8.8* 8.7* 8.6*    GFR: Estimated Creatinine Clearance: 43.7 mL/min (A) (by C-G formula based on SCr of 3.24 mg/dL (H)).  Liver Function Tests: Recent Labs  Lab 09/06/20 1345 09/07/20 0321 09/08/20 0345 09/09/20 0500  AST 14* '20 24 26  '$ ALT '8 11 11 11  '$ ALKPHOS 45 52 45 43  BILITOT 0.5 0.9 1.0 0.9  PROT 7.2 8.0 7.1 7.1  ALBUMIN 2.4* 2.6* 2.3* 2.4*    CBG: No results for input(s): GLUCAP in the last  168 hours.   Recent Results (from the past 240 hour(s))  Culture, blood (routine x 2)     Status: None (Preliminary result)   Collection Time: 09/06/20 11:03 AM   Specimen: BLOOD RIGHT HAND  Result Value Ref Range Status   Specimen Description BLOOD RIGHT HAND  Final   Special Requests   Final    BOTTLES DRAWN AEROBIC AND ANAEROBIC Blood Culture adequate volume   Culture   Final    NO GROWTH 2 DAYS Performed at [facility], [address] [place], [place] [address]    Report Status PENDING  Incomplete  Culture, blood (routine x 2)     Status: None (Preliminary result)   Collection Time: 09/06/20 11:03 AM   Specimen: BLOOD LEFT HAND  Result Value Ref Range Status   Specimen Description BLOOD LEFT HAND  Final   Special Requests   Final    BOTTLES DRAWN AEROBIC AND ANAEROBIC Blood Culture adequate volume   Culture   Final    NO GROWTH 2 DAYS Performed at [facility], [place] [address], [place], [place] [address]    Report Status PENDING  Incomplete  Resp Panel by RT-PCR (Flu A&B, Covid) Nasopharyngeal Swab     Status: Abnormal   Collection Time: 09/06/20 11:43 AM   Specimen: Nasopharyngeal Swab; Nasopharyngeal(NP) swabs in vial transport medium  Result Value Ref Range Status   SARS Coronavirus 2 by RT PCR POSITIVE (A) NEGATIVE Final    Comment: RESULT CALLED TO, READ BACK BY AND VERIFIED WITH: Kathaleen Grinder RN [id number] 09/06/20 A BROWNING (NOTE) SARS-CoV-2 target nucleic acids are DETECTED.  The SARS-CoV-2 RNA is generally detectable in  upper respiratory specimens during the acute phase of infection. Positive results are indicative of the presence of the identified virus, but do not rule out bacterial infection or co-infection with other pathogens not detected by the test. Clinical correlation with patient history and other diagnostic information is necessary to determine patient infection status. The expected result is Negative.  Fact Sheet for Patients: EntrepreneurPulse.com.au  Fact Sheet for Healthcare Providers: IncredibleEmployment.be  This test is not yet approved or cleared by the [place] FDA and  has been authorized for detection and/or diagnosis of SARS-CoV-2 by FDA under an Emergency Use Authorization (EUA).  This EUA will remain in effect (meaning this test can  be used) for the duration of  the COVID-19 declaration under Section 564(b)(1) of the Act, 21 U.S.C. section 360bbb-3(b)(1), unless the authorization is terminated or revoked sooner.     Influenza A by PCR NEGATIVE NEGATIVE Final   Influenza B by PCR NEGATIVE NEGATIVE Final    Comment: (NOTE) The Xpert Xpress SARS-CoV-2/FLU/RSV plus assay is intended as an aid in the diagnosis of influenza from Nasopharyngeal swab specimens and should not be used as a sole basis for treatment. Nasal washings and aspirates are unacceptable for Xpert Xpress SARS-CoV-2/FLU/RSV testing.  Fact Sheet for Patients: EntrepreneurPulse.com.au  Fact Sheet for Healthcare Providers: IncredibleEmployment.be  This test is not yet approved or cleared by the [place] FDA and has been authorized for detection and/or diagnosis of SARS-CoV-2 by FDA under an Emergency Use Authorization (EUA). This EUA will remain in effect (meaning this test can be used) for the duration of the COVID-19 declaration under Section 564(b)(1) of the Act, 21 U.S.C. section 360bbb-3(b)(1), unless the authorization is  terminated or revoked.  Performed at [facility], [place] [address] [place], [place] [address]  Radiology Studies: No results found.      Scheduled Meds:  (feeding supplement) PROSource Plus  30 mL Oral BID BM   vitamin C  1,000 mg Oral Daily   buprenorphine-naloxone  1 tablet Sublingual BID   Chlorhexidine Gluconate Cloth  6 each Topical Daily   collagenase   Topical Daily   darbepoetin (ARANESP) injection - NON-DIALYSIS  150 mcg Subcutaneous Q Sat-1800   diltiazem  180 mg Oral Daily   enoxaparin (LOVENOX) injection  1 mg/kg Subcutaneous Q24H   famotidine  20 mg Oral BID   iron polysaccharides  150 mg Oral Daily   lidocaine  1 patch Transdermal Q24H   melatonin  3 mg Oral QHS   methocarbamol  500 mg Oral TID   multivitamin with minerals  1 tablet Oral Daily   nicotine  7 mg Transdermal Daily   polyethylene glycol  17 g Oral Daily   senna-docusate  1 tablet Oral BID   Warfarin - Pharmacist Dosing Inpatient   Does not apply q1600   zinc sulfate  220 mg Oral Daily   Continuous Infusions:  ceFEPime (MAXIPIME) IV Stopped (09/09/20 1010)     LOS: 32 days        Hosie Poisson, MD [facility]   To contact the attending provider between 7A-7P or the covering provider during after hours 7P-7A, please log into the web site [email or web address] and access using universal  password for that web site. If you do not have the password, please call the hospital operator.  09/09/2020, 5:22 PM

## 2020-09-10 DIAGNOSIS — I330 Acute and subacute infective endocarditis: Secondary | ICD-10-CM | POA: Diagnosis not present

## 2020-09-10 DIAGNOSIS — U071 COVID-19: Secondary | ICD-10-CM | POA: Diagnosis not present

## 2020-09-10 LAB — HEPATIC FUNCTION PANEL
ALT: 11 U/L (ref 0–44)
AST: 22 U/L (ref 15–41)
Albumin: 2.4 g/dL — ABNORMAL LOW (ref 3.5–5.0)
Alkaline Phosphatase: 38 U/L (ref 38–126)
Bilirubin, Direct: <0.1 mg/dL (ref 0.0–0.2)
Total Bilirubin: 1 mg/dL (ref 0.3–1.2)
Total Protein: 7.1 g/dL (ref 6.5–8.1)

## 2020-09-10 LAB — CK: Total CK: 9 U/L — ABNORMAL LOW (ref 49–397)

## 2020-09-10 LAB — BASIC METABOLIC PANEL
Anion gap: 11 (ref 5–15)
BUN: 29 mg/dL — ABNORMAL HIGH (ref 6–20)
CO2: 25 mmol/L (ref 22–32)
Calcium: 8.7 mg/dL — ABNORMAL LOW (ref 8.9–10.3)
Chloride: 103 mmol/L (ref 98–111)
Creatinine, Ser: 3.28 mg/dL — ABNORMAL HIGH (ref 0.61–1.24)
GFR, Estimated: 24 mL/min — ABNORMAL LOW (ref >60)
Glucose, Bld: 76 mg/dL (ref 70–99)
Potassium: 3.5 mmol/L (ref 3.5–5.1)
Sodium: 139 mmol/L (ref 135–145)

## 2020-09-10 LAB — PROTIME-INR
INR: 2.5 — ABNORMAL HIGH (ref 0.8–1.2)
Prothrombin Time: 27 seconds — ABNORMAL HIGH (ref 11.4–15.2)

## 2020-09-10 LAB — C-REACTIVE PROTEIN: CRP: 1.7 mg/dL — ABNORMAL HIGH (ref <1.0)

## 2020-09-10 LAB — D-DIMER, QUANTITATIVE: D-Dimer, Quant: 2.29 ug/mL-FEU — ABNORMAL HIGH (ref 0.00–0.50)

## 2020-09-10 LAB — LACTATE DEHYDROGENASE: LDH: 166 U/L (ref 98–192)

## 2020-09-10 MED ORDER — WARFARIN SODIUM 5 MG PO TABS
6.0000 mg | ORAL_TABLET | Freq: Once | ORAL | Status: AC
  Administered 2020-09-10: 6 mg via ORAL
  Filled 2020-09-10: qty 1

## 2020-09-10 NOTE — Progress Notes (Signed)
ANTICOAGULATION CONSULT NOTE - Follow Up Consult  Pharmacy Consult for Warfarin and Lovenox Indication:  mechanical aortic valve    No Known Allergies  Patient Measurements: Height: '6\' 4"'$  (193 cm) Weight: 114.6 kg (252 lb 10.4 oz) IBW/kg (Calculated) : 86.8  Vital Signs: Temp: 98.6 F (37 C) (06/19 0400) Temp Source: Oral (06/19 0400) BP: 132/86 (06/19 0400) Pulse Rate: 87 (06/19 0400)  Labs: Recent Labs    09/08/20 0345 09/09/20 0500 09/10/20 0423  HGB 9.3*  --   --   HCT 30.3*  --   --   PLT 217  --   --   LABPROT 23.9* 25.0* 27.0*  INR 2.1* 2.3* 2.5*  CREATININE  --  3.24* 3.28*  CKTOTAL 14* 11* 9*    Estimated Creatinine Clearance: 42.7 mL/min (A) (by C-G formula based on SCr of 3.28 mg/dL (H)).  Assessment: 39yom with recent endocarditis> AV root repair and Mechanical AVR on 08/03/20. Warfarin started 5/13. Left AMA 5/16 pm then returned 5/17 pm. INR 2.6 on 5/18 and Warfarin resumed. Pharmacy was consulted 5/27 (INR 1.8) to bridge with heparin until INR >2.5 again. Heparin stopped 6/1 when INR 3.1.    INR therapeutic at 2.5 with goal INR 2.5-3.5; also on Lovenox bridge until INR >= 2.5. CBCs every 72 hours, most recent Hgb 9.3 (low/stable), PLTs WNL. No bleeding reported.   Has received 6-'8mg'$  for 5 days, therapeutic dose is likely '8mg'$  x2 days per week, '6mg'$  all other days. Gave '8mg'$  yesterday, will give '6mg'$  tonight.   Goal of Therapy:  INR 2.5-3.5  per Dr. Kipp Brood Monitor platelets by anticoagulation protocol: Yes   Plan:  Give Warfarin 6 mg tonight Discontinue Lovenox Daily PT/INR CBC q72 hr Monitor for s/sx of bleeding  Thank you for involving pharmacy in this patient's care.  Norina Buzzard, PharmD PGY1 Pharmacy Resident 09/10/2020 7:17 AM   **Pharmacist phone directory can be found on [email or web address] listed under [place]**

## 2020-09-10 NOTE — Progress Notes (Signed)
PROGRESS NOTE    Alex [name]  [id number] DOB: June 05, 1980 DOA: 08/08/2020 PCP: Loraine Maple [facility]    Chief Complaint  Patient presents with   Post -Op Open Heart Surgery    Brief Narrative:  40 year old male with history of polysubstance abuse, aortic valve endocarditis, Serratia bacteremia, septic emboli causing stroke and acute kidney injury requiring hemodialysis.  Patient left hospital AMA on 08/08/2020.  He came back same night after discussion with CT surgery.   Since admission patient has improved significantly.  Renal function has returned to quint discontinuation of dialysis and hemodialysis catheter by the nephrology team.  His last set of blood cultures were negative on 5/7.  Because of poor venous access and midline catheter has been placed. 6/15 patient was found to have fevers and sore throat and has tested positive for COVID.  He is stable from a respiratory standpoint and has mild elevation of inflammatory markers.   completed Remdesivir x3 days only. Pt seen and examined, reports he felt nauseated earlier this and vomited may be a few spoonfuls.   Assessment & Plan:   Principal Problem:   Endocarditis Active Problems:   Opioid use disorder   Acute renal failure with oliguria (HCC)   Cerebral embolism with cerebral infarction   Anemia   Acute kidney injury (AKI) with acute tubular necrosis (ATN) (HCC)   Atrial fibrillation with RVR (HCC)   Typical atrial flutter (HCC)   Bilateral lower extremity edema   Status post mechanical aortic valve replacement   Abscess of aorta (HCC)   Fever, unspecified   Acute COVID-19   Aortic valve endocarditis with perivalvular abscess , aortic root abscess Growing serratia bacteremia Septic emboli causing strokes. MRI brain- multiple infarcts.  Blood cultures growing serrativa marcescens.  S/p mechanical aortic valve replacement, aortic root construction and closure of aortic sinus  tract. Continue with cefepime with end date 09/15/20. Recommend outpatient follow up with Dr Kipp Brood.  Continue with Lovenox and coumadin.    Acute COVID 19 injection:  Asymptomatic. Pt on RA, denies any sob or chest pain or cough.     Anemia of acute illness/ thrombocytopenia: - s/p 3 units of prbc transfusion. Currently on darbopoetin.  Transfuse to keep hemoglobin greater than 7.    AKI  SEC to ATN from infection  Slowing improving creatinine.    Polysubstance abuse:  On chronic suboxone therapy.  Follows up with pain clinic.     Bilateral lower extremity edema:  Improved.    Body mass index is 30.75 kg/m. Obesity    Mild nausea:  - unclear etiology - prn zofran.    Hypokalemia Replaced.    DVT prophylaxis: (Lovenox/) Code Status: (Full code) Family Communication: family at bedside.  Disposition:   Status is: Inpatient  Remains inpatient appropriate because:Unsafe d/c plan and IV treatments appropriate due to intensity of illness or inability to take PO  Dispo: The patient is from: Home              Anticipated d/c is to: Home              Patient currently is not medically stable to d/c.   Difficult to place patient No       Consultants:  None.   Procedures: none.   Antimicrobials:  Antibiotics Given (last 72 hours)     Date/Time Action Medication Dose Rate   09/07/20 2108 New Bag/Given   ceFEPIme (MAXIPIME) 2 g in sodium chloride 0.9 % 100 mL  IVPB 2 g 200 mL/hr   09/08/20 1044 New Bag/Given   remdesivir 100 mg in sodium chloride 0.9 % 100 mL IVPB 100 mg 200 mL/hr   09/08/20 1219 New Bag/Given   ceFEPIme (MAXIPIME) 2 g in sodium chloride 0.9 % 100 mL IVPB 2 g 200 mL/hr   09/08/20 2247 New Bag/Given   ceFEPIme (MAXIPIME) 2 g in sodium chloride 0.9 % 100 mL IVPB 2 g 200 mL/hr   09/09/20 [id number] New Bag/Given   ceFEPIme (MAXIPIME) 2 g in sodium chloride 0.9 % 100 mL IVPB 2 g 200 mL/hr   09/09/20 2250 New Bag/Given   ceFEPIme (MAXIPIME)  2 g in sodium chloride 0.9 % 100 mL IVPB 2 g 200 mL/hr   09/10/20 0928 New Bag/Given   ceFEPIme (MAXIPIME) 2 g in sodium chloride 0.9 % 100 mL IVPB 2 g 200 mL/hr        Subjective: No chest pain or sob. Slightly nauseated.   Objective: Vitals:   09/09/20 2247 09/10/20 0400 09/10/20 0906 09/10/20 1437  BP: (!) 133/96 132/86 126/89 (!) 132/94  Pulse: 88 87 85 99  Resp: '18 18 15 16  '$ Temp: 98.4 F (36.9 C) 98.6 F (37 C) 98.6 F (37 C) 97.8 F (36.6 C)  TempSrc: Oral Oral Oral Oral  SpO2: 97% 95% 97% 99%  Weight:  114.6 kg    Height:        Intake/Output Summary (Last 24 hours) at 09/10/2020 1658 Last data filed at 09/10/2020 0928 Gross per 24 hour  Intake 360 ml  Output 2400 ml  Net -2040 ml    Filed Weights   09/07/20 0647 09/08/20 0500 09/10/20 0400  Weight: 119.6 kg 117.3 kg 114.6 kg    Examination:  General exam: Alert and comfortable.  Respiratory system: clear to auscultation, no wheezing on RA.  Cardiovascular system: S1 & S2 heard, RRR no JVD, no pedal edema Gastrointestinal system: Abdomen is soft, NT ND BS+ Central nervous system: Alert and oriented non focal. Extremities: no cyanosis. Skin: no rashes seen.  Psychiatry: Mood appropriate.     Data Reviewed: I have personally reviewed following labs and imaging studies  CBC: Recent Labs  Lab 09/06/20 0425 09/07/20 0321 09/08/20 0345  WBC 4.9 6.6 4.5  NEUTROABS  --  4.2  --   HGB 8.3* 10.0* 9.3*  HCT 27.3* 32.7* 30.3*  MCV 88.1 87.4 87.1  PLT 234 274 217     Basic Metabolic Panel: Recent Labs  Lab 09/04/20 0346 09/06/20 1345 09/09/20 0500 09/10/20 0423  NA 137 136 140 139  K 3.8 3.7 3.3* 3.5  CL 104 100 102 103  CO2 '25 27 27 25  '$ GLUCOSE 88 90 76 76  BUN 34* 28* 31* 29*  CREATININE 3.74* 3.45* 3.24* 3.28*  CALCIUM 8.8* 8.7* 8.6* 8.7*     GFR: Estimated Creatinine Clearance: 42.7 mL/min (A) (by C-G formula based on SCr of 3.28 mg/dL (H)).  Liver Function Tests: Recent Labs   Lab 09/06/20 1345 09/07/20 0321 09/08/20 0345 09/09/20 0500 09/10/20 0423  AST 14* '20 24 26 22  '$ ALT '8 11 11 11 11  '$ ALKPHOS 45 52 45 43 38  BILITOT 0.5 0.9 1.0 0.9 1.0  PROT 7.2 8.0 7.1 7.1 7.1  ALBUMIN 2.4* 2.6* 2.3* 2.4* 2.4*     CBG: No results for input(s): GLUCAP in the last 168 hours.   Recent Results (from the past 240 hour(s))  Culture, blood (routine x 2)     Status:  None (Preliminary result)   Collection Time: 09/06/20 11:03 AM   Specimen: BLOOD RIGHT HAND  Result Value Ref Range Status   Specimen Description BLOOD RIGHT HAND  Final   Special Requests   Final    BOTTLES DRAWN AEROBIC AND ANAEROBIC Blood Culture adequate volume   Culture   Final    NO GROWTH 4 DAYS Performed at [facility], [address], [place], [place] [address]    Report Status PENDING  Incomplete  Culture, blood (routine x 2)     Status: None (Preliminary result)   Collection Time: 09/06/20 11:03 AM   Specimen: BLOOD LEFT HAND  Result Value Ref Range Status   Specimen Description BLOOD LEFT HAND  Final   Special Requests   Final    BOTTLES DRAWN AEROBIC AND ANAEROBIC Blood Culture adequate volume   Culture   Final    NO GROWTH 4 DAYS Performed at [facility], [place] [address], [place], [place] [address]    Report Status PENDING  Incomplete  Resp Panel by RT-PCR (Flu A&B, Covid) Nasopharyngeal Swab     Status: Abnormal   Collection Time: 09/06/20 11:43 AM   Specimen: Nasopharyngeal Swab; Nasopharyngeal(NP) swabs in vial transport medium  Result Value Ref Range Status   SARS Coronavirus 2 by RT PCR POSITIVE (A) NEGATIVE Final    Comment: RESULT CALLED TO, READ BACK BY AND VERIFIED WITH: Kathaleen Grinder RN [id number] 09/06/20 A BROWNING (NOTE) SARS-CoV-2 target nucleic acids are DETECTED.  The SARS-CoV-2 RNA is generally detectable in upper respiratory specimens during the acute phase of infection. Positive results are indicative of the presence of the identified virus, but do not  rule out bacterial infection or co-infection with other pathogens not detected by the test. Clinical correlation with patient history and other diagnostic information is necessary to determine patient infection status. The expected result is Negative.  Fact Sheet for Patients: EntrepreneurPulse.com.au  Fact Sheet for Healthcare Providers: IncredibleEmployment.be  This test is not yet approved or cleared by the [place] FDA and  has been authorized for detection and/or diagnosis of SARS-CoV-2 by FDA under an Emergency Use Authorization (EUA).  This EUA will remain in effect (meaning this test can  be used) for the duration of  the COVID-19 declaration under Section 564(b)(1) of the Act, 21 U.S.C. section 360bbb-3(b)(1), unless the authorization is terminated or revoked sooner.     Influenza A by PCR NEGATIVE NEGATIVE Final   Influenza B by PCR NEGATIVE NEGATIVE Final    Comment: (NOTE) The Xpert Xpress SARS-CoV-2/FLU/RSV plus assay is intended as an aid in the diagnosis of influenza from Nasopharyngeal swab specimens and should not be used as a sole basis for treatment. Nasal washings and aspirates are unacceptable for Xpert Xpress SARS-CoV-2/FLU/RSV testing.  Fact Sheet for Patients: EntrepreneurPulse.com.au  Fact Sheet for Healthcare Providers: IncredibleEmployment.be  This test is not yet approved or cleared by the [place] FDA and has been authorized for detection and/or diagnosis of SARS-CoV-2 by FDA under an Emergency Use Authorization (EUA). This EUA will remain in effect (meaning this test can be used) for the duration of the COVID-19 declaration under Section 564(b)(1) of the Act, 21 U.S.C. section 360bbb-3(b)(1), unless the authorization is terminated or revoked.  Performed at [facility], [place] [address] [place], [place] [address]           Radiology Studies: No  results found.      Scheduled Meds:  (feeding supplement) PROSource  Plus  30 mL Oral BID BM   vitamin C  1,000 mg Oral Daily   buprenorphine-naloxone  1 tablet Sublingual BID   Chlorhexidine Gluconate Cloth  6 each Topical Daily   collagenase   Topical Daily   darbepoetin (ARANESP) injection - NON-DIALYSIS  150 mcg Subcutaneous Q Sat-1800   diltiazem  180 mg Oral Daily   famotidine  20 mg Oral BID   iron polysaccharides  150 mg Oral Daily   lidocaine  1 patch Transdermal Q24H   melatonin  3 mg Oral QHS   methocarbamol  500 mg Oral TID   multivitamin with minerals  1 tablet Oral Daily   nicotine  7 mg Transdermal Daily   polyethylene glycol  17 g Oral Daily   senna-docusate  1 tablet Oral BID   warfarin  6 mg Oral ONCE-1600   Warfarin - Pharmacist Dosing Inpatient   Does not apply q1600   zinc sulfate  220 mg Oral Daily   Continuous Infusions:  ceFEPime (MAXIPIME) IV Stopped (09/10/20 1000)     LOS: 33 days        Hosie Poisson, MD [facility]   To contact the attending provider between 7A-7P or the covering provider during after hours 7P-7A, please log into the web site [email or web address] and access using universal [place] password for that web site. If you do not have the password, please call the hospital operator.  09/10/2020, 4:58 PM

## 2020-09-11 ENCOUNTER — Encounter: Payer: 59 | Admitting: Thoracic Surgery (Cardiothoracic Vascular Surgery)

## 2020-09-11 DIAGNOSIS — I63119 Cerebral infarction due to embolism of unspecified vertebral artery: Secondary | ICD-10-CM | POA: Diagnosis not present

## 2020-09-11 DIAGNOSIS — N170 Acute kidney failure with tubular necrosis: Secondary | ICD-10-CM | POA: Diagnosis not present

## 2020-09-11 DIAGNOSIS — U071 COVID-19: Secondary | ICD-10-CM | POA: Diagnosis not present

## 2020-09-11 DIAGNOSIS — I330 Acute and subacute infective endocarditis: Secondary | ICD-10-CM | POA: Diagnosis not present

## 2020-09-11 LAB — CULTURE, BLOOD (ROUTINE X 2)
Culture: NO GROWTH
Culture: NO GROWTH
Special Requests: ADEQUATE
Special Requests: ADEQUATE

## 2020-09-11 LAB — CBC
HCT: 31 % — ABNORMAL LOW (ref 39.0–52.0)
Hemoglobin: 9.5 g/dL — ABNORMAL LOW (ref 13.0–17.0)
MCH: 26.9 pg (ref 26.0–34.0)
MCHC: 30.6 g/dL (ref 30.0–36.0)
MCV: 87.8 fL (ref 80.0–100.0)
Platelets: 183 10*3/uL (ref 150–400)
RBC: 3.53 MIL/uL — ABNORMAL LOW (ref 4.22–5.81)
RDW: 16.7 % — ABNORMAL HIGH (ref 11.5–15.5)
WBC: 3.9 10*3/uL — ABNORMAL LOW (ref 4.0–10.5)
nRBC: 0 % (ref 0.0–0.2)

## 2020-09-11 LAB — HEPATIC FUNCTION PANEL
ALT: 9 U/L (ref 0–44)
AST: 19 U/L (ref 15–41)
Albumin: 2.5 g/dL — ABNORMAL LOW (ref 3.5–5.0)
Alkaline Phosphatase: 39 U/L (ref 38–126)
Bilirubin, Direct: <0.1 mg/dL (ref 0.0–0.2)
Total Bilirubin: 0.9 mg/dL (ref 0.3–1.2)
Total Protein: 7.1 g/dL (ref 6.5–8.1)

## 2020-09-11 LAB — CK: Total CK: 9 U/L — ABNORMAL LOW (ref 49–397)

## 2020-09-11 LAB — PROTIME-INR
INR: 3.1 — ABNORMAL HIGH (ref 0.8–1.2)
Prothrombin Time: 32.2 seconds — ABNORMAL HIGH (ref 11.4–15.2)

## 2020-09-11 LAB — D-DIMER, QUANTITATIVE: D-Dimer, Quant: 2.29 ug/mL-FEU — ABNORMAL HIGH (ref 0.00–0.50)

## 2020-09-11 LAB — LACTATE DEHYDROGENASE: LDH: 158 U/L (ref 98–192)

## 2020-09-11 LAB — C-REACTIVE PROTEIN: CRP: 1.5 mg/dL — ABNORMAL HIGH (ref <1.0)

## 2020-09-11 MED ORDER — WARFARIN SODIUM 5 MG PO TABS
6.0000 mg | ORAL_TABLET | Freq: Once | ORAL | Status: AC
  Administered 2020-09-11: 6 mg via ORAL
  Filled 2020-09-11: qty 1

## 2020-09-11 NOTE — TOC Progression Note (Signed)
Transition of Care [facility]) - Progression Note    Patient Details  Name: Alex [name] MRN: [id number] Date of Birth: 03/29/1980  Transition of Care [facility]) CM/SW [place], RN Phone Number: 09/11/2020, 12:02 PM  Clinical Narrative:    Case management spoke with [name] with [facility] and the patient was declined by the insurance company for [place] placement on Friday, 09/08/2020.  The patient will remain hospitalized to receive IV antibiotics through his PICC line until they are completed.  The patient currently has COVID infection.  The patient has history of substance abuse and needs to remain hospitalized until IV antibiotics are complete.  CM and MSW will continue to follow for discharge planning for home on 09/15/2020.   Expected Discharge Plan: Long Term Acute Care (LTAC) Barriers to Discharge: Continued Medical Work up  Expected Discharge Plan and Services Expected Discharge Plan: [place] (LTAC)   Discharge Planning Services: CM Consult Post Acute Care Choice: Long Term Acute Care (LTAC) Living arrangements for the past 2 months: Single Family Home                                       Social Determinants of Health (SDOH) Interventions    Readmission Risk Interventions Readmission Risk Prevention Plan 08/25/2020  Medication Review (RN Care Manager) Complete  SW Recovery Care/Counseling Consult Complete  Skilled Nursing Facility Complete  Some recent data might be hidden

## 2020-09-11 NOTE — Progress Notes (Signed)
PT Cancellation Note  Patient Details Name: Alex [name] MRN: [id number] DOB: 07/30/1980   Cancelled Treatment:    Reason Eval/Treat Not Completed: Patient declined, no reason specified;Other (comment) (pt is completely independent in his room, showering, easily maneuvering around the room per his MOM, but,  of course,  still confined by Covid.).  Pt wishes to sleep, no further acute PT needs.  Pt expected to d/c 6/25 after regimen of IV meds completed.  Will sign off from acute PT at this time. 09/11/2020  Ginger Carne., PT Acute Rehabilitation Services [phone]  (pager) [phone]  (office)   Alex [name] 09/11/2020, 2:28 PM

## 2020-09-11 NOTE — Progress Notes (Signed)
[facility] PROGRESS NOTE  Alex [name] [id number] DOB: 05/11/1980 DOA: 08/08/2020 PCP: [facility]  Status: Remains inpatient appropriate because:Unsafe d/c plan, IV treatments appropriate due to intensity of illness or inability to take PO and Inpatient level of care appropriate due to severity of illness. IVDA SO NOT AN APPROPRIATE CANDIDATE FOR HH ANTIBIOTICS VIA PICC LINE   Dispo: The patient is from: Home              Anticipated d/c is to: Home 6/25              Patient currently is not medically stable to d/c.   Difficult to place patient Yes  **Discussed discharge plans with patient's mother.  Multiple questions answered.  She is aware that I have instructed the patient to go ahead and notify his PCP who is also the provider for his Suboxone.  Will need at least a 7-day refill of Suboxone provided by his PCP and will need to follow-up with PCP in 1 week.  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  I will give him a note that states will be released at the discretion of his primary care physician after initial follow-up visit  Level of care: Med-Surg  Code Status: Full Family Communication: 6/15 mother at bedside DVT prophylaxis: Warfarin plus Lovenox COVID vaccination status: Received first dose of Pfizer on 08/16/2020-he developed acute COVID infection during hospitalization therefore will need to wait 3 more months before receiving second vaccination  HPI: 40 year old male with history of polysubstance abuse, aortic valve endocarditis, Serratia bacteremia, septic emboli causing stroke and acute kidney injury requiring hemodialysis.  Patient left hospital AMA on 08/08/2020.  He came back same night after discussion with CT surgery.  Since admission patient has improved significantly.  Renal function has returned to quint discontinuation of dialysis and hemodialysis  catheter by the nephrology team.  His last set of blood cultures were negative on 5/7.  Because of poor venous access and midline catheter has been placed. 6/15 patient was found to have fevers and sore throat and has tested positive for COVID.  He is stable from a respiratory standpoint and has mild elevation of inflammatory markers.  Plan is to administer IV remdesivir x3 days only.  Significant events:  4/26 transfer from [facility] to [facility] CT head 4/26 >> no acute findings CT abd/pelvis 4/26 >> cholelithiasis w/o inflammatory chagnes, mild hepatomegaly (25 cm), moderate splenomegaly (16.7 cm), 2 mm non obstructing kidney stone on Lt 4/27 CVL placed. Pressors escalating.  4/28 intubated for TEE, CVC, HD, and A-line placed. CRRT started  Remains on CRRT MRI on 4/30-multiple infarcts 5/4: repeat TEE shows an aortic valve abscess 5/5: extubated , self-removed HD cath so CRRT holiday> developed severe hyperkalemia overnight. 5/6: new temp HD cath placed  5/8: new temp HD cath placed after prior one self-removed by patient 5/9: Orthopantogram neg for abscess 5/10: HD cath replaced by IR [facility] 5/12 to OR for AVR, aortic root reconstruction, closure of aortic sinus track. To 2H ICU on vent post op.  4 units PRBC given intraop.  5/13 extubated, on CRRT, remains on pressors 5/18, left AMA. 5/30 continues on intermittent hemodialysis as directed by nephrology team.  Continues with excellent urinary output.  Creatinine between 2.9 and 4.5  Subjective: Alert.  No complaints.  Discussed above discharge plan.  Objective: Vitals:   09/11/20 0500 09/11/20  0805  BP: (!) 123/93 114/80  Pulse: 71 76  Resp: 20 18  Temp: 98.6 F (37 C) 98.1 F (36.7 C)  SpO2: 96% 95%    Intake/Output Summary (Last 24 hours) at 09/11/2020 0817 Last data filed at 09/11/2020 0802 Gross per 24 hour  Intake 120 ml  Output 4000 ml  Net -3880 ml   Filed Weights   09/07/20 0647 09/08/20 0500 09/10/20 0400  Weight: 119.6  kg 117.3 kg 114.6 kg    Exam:  Constitutional: No acute distress, calm Respiratory: Lungs remain clear, stable on room air, no cough or increased work of breathing or pleuritic chest pain Cardiac: Pulses regular nontachycardic.  Normal heart sounds.  No peripheral edema. Abdomen:  LBM 6/19, soft nontender nondistended.  Eating well. Genitourinary: Voids easily Neurologic: CN 2-12 grossly intact. Sensation intact, DTR normal. Strength 5/5 x all 4 extremities.  Psychiatric: Alert and oriented x3.  Pleasant affect.   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   -Presented with sepsis physiology required short-term mechanical ventilation   -MRI brain (07/22/2020): multiple acute infarcts.   -Blood cultures: Serratia marcescens -TEE: severe AI and AR with AV vegetation    -S/p mechanical aortic valve replacement, aortic root construction and closure of aortic sinus tract.   -Needs 6 weeks of cefepime per ID with end date 09/15/20  -Coumadin per pharmacy.  Will need to establish with cardiology in [place] re: Coumadin clinic-ambulatory referral has been sent -INR back up to 2.1 but still low re mech heart valave-cont Lovenox bridge -Follow-up with Dr. Kipp Brood after discharge for routine postoperative visit -Has follow-up ID appointment in place with Dr. Montel Culver on 6/28-contact ID team to clarify if patient needs additional oral antibiotics after discharge   Acute COVID-19 infection -Patient has tested positive for COVID-19 on 6/15-anticipate 10-day quarantine with last date 6/25 -O2 sats 96%; TM 99.9 -Mild COVID without hypoxemia -remdesivir for 3 days- Follow LFTs -CRP trend: 3.4 > 4.2>4.4 > 3.0 > 1.7 > 1.5 -LDH WNLs -D dimer: 2.9>2.57 > 2.47 > 2.29 > 2.29 -Platelets slowly trending down but remain normal.  WBC slightly low at 3.9  Status post recent mechanical aortic  valve replacement -Continue warfarin  Recurrent Anemia/thrombocytopenia -patient is s/p 3 units PRBC during this hospital stay.   -Hemoglobin stable-infuse only if hemoglobin<7 -Continue oral iron; will discharge-was also given a dose of IV iron this admission -Platelets are normal  Atrial fibrillation/flutter with RVR -Evaluated by cardiology -Maintaining sinus rhythm on CCB and BB  -Continue warfarin -patient will need to follow-up with cardiology in [place] to establish at their Coumadin clinic  Acute kidney injury -likely from ATN 2/2 acute infection and sepsis physiology -Last HD 5/30  -Creatinine stable 3.45 on 6/15-we will check electrolyte panel tomorrow on 6/18  Polysubstance abuse -patient has amphetamine abuse, nicotine dependence, opiate dependence on agonist therapy.   -He was on chronic Suboxone therapy at a clinic in [facility] prior to admission. -He told this Probation officer that he will no longer utilize drugs and that most recent circumstances of this admission has convinced him that the risks are too great to continue using IV drugs  Bilateral lower extremity edema -Improved after given several days of twice daily oral Lasix-Lasix placed on hold in context of acute COVID infection  Obesity BMI-34.14kg/m    Data Reviewed: Basic Metabolic Panel: Recent Labs  Lab 09/06/20 1345 09/09/20 0500 09/10/20 0423  NA 136  140 139  K 3.7 3.3* 3.5  CL 100 102 103  CO2 '27 27 25  '$ GLUCOSE 90 76 76  BUN 28* 31* 29*  CREATININE 3.45* 3.24* 3.28*  CALCIUM 8.7* 8.6* 8.7*   Liver Function Tests: Recent Labs  Lab 09/07/20 0321 09/08/20 0345 09/09/20 0500 09/10/20 0423 09/11/20 0500  AST '20 24 26 22 19  '$ ALT '11 11 11 11 9  '$ ALKPHOS 52 45 43 38 39  BILITOT 0.9 1.0 0.9 1.0 0.9  PROT 8.0 7.1 7.1 7.1 7.1  ALBUMIN 2.6* 2.3* 2.4* 2.4* 2.5*      CBC: Recent Labs  Lab 09/06/20 0425 09/07/20 0321 09/08/20 0345 09/11/20 0500  WBC 4.9 6.6 4.5 3.9*   NEUTROABS  --  4.2  --   --   HGB 8.3* 10.0* 9.3* 9.5*  HCT 27.3* 32.7* 30.3* 31.0*  MCV 88.1 87.4 87.1 87.8  PLT 234 274 217 183    BNP (last 3 results) Recent Labs    07/25/20 0334 07/26/20 0331  BNP 153.5* 76.0     Continuous Infusions:  ceFEPime (MAXIPIME) IV 2 g (09/10/20 2305)    Principal Problem:   Endocarditis Active Problems:   Opioid use disorder   Acute renal failure with oliguria (HCC)   Cerebral embolism with cerebral infarction   Anemia   Acute kidney injury (AKI) with acute tubular necrosis (ATN) (HCC)   Atrial fibrillation with RVR (HCC)   Typical atrial flutter (HCC)   Bilateral lower extremity edema   Status post mechanical aortic valve replacement   Abscess of aorta (HCC)   Fever, unspecified   Acute COVID-19   Consultants: Infectious disease PCCM Neurology CT surgery  Procedures: Echocardiogram TEE CRRT Cortrack  Antibiotics: Anti-infectives (From admission, onward)    Start     Dose/Rate Route Frequency Ordered Stop   09/07/20 1051  remdesivir 100 mg in sodium chloride 0.9 % 100 mL IVPB       See Hyperspace for full Linked Orders Report.   100 mg 200 mL/hr over 30 Minutes Intravenous Daily 09/07/20 1051 09/08/20 1115   09/07/20 1000  remdesivir 100 mg in sodium chloride 0.9 % 100 mL IVPB  Status:  Discontinued       See Hyperspace for full Linked Orders Report.   100 mg 200 mL/hr over 30 Minutes Intravenous Daily 09/06/20 1328 09/07/20 1051   09/06/20 1430  remdesivir 200 mg in sodium chloride 0.9% 250 mL IVPB       See Hyperspace for full Linked Orders Report.   200 mg 580 mL/hr over 30 Minutes Intravenous Once 09/06/20 1328 09/06/20 1918   09/04/20 2200  ceFEPIme (MAXIPIME) 2 g in sodium chloride 0.9 % 100 mL IVPB        2 g 200 mL/hr over 30 Minutes Intravenous Every 12 hours 09/04/20 0931 09/16/20 0959   09/03/20 1000  levofloxacin (LEVAQUIN) tablet 750 mg  Status:  Discontinued        750 mg Oral Every other day  09/02/20 1653 09/04/20 0931   09/02/20 1745  levofloxacin (LEVAQUIN) tablet 750 mg        750 mg Oral  Once 09/02/20 1653 09/02/20 1819   08/25/20 1800  ceFEPIme (MAXIPIME) 2 g in sodium chloride 0.9 % 100 mL IVPB  Status:  Discontinued        2 g 200 mL/hr over 30 Minutes Intravenous Every 12 hours 08/25/20 1618 09/02/20 1653   08/20/20 2200  ceFEPIme (MAXIPIME) 1 g in sodium chloride  0.9 % 100 mL IVPB  Status:  Discontinued        1 g 200 mL/hr over 30 Minutes Intravenous Every 24 hours 08/20/20 1523 08/25/20 1618   08/13/20 2200  ceFEPIme (MAXIPIME) 2 g in sodium chloride 0.9 % 100 mL IVPB  Status:  Discontinued        2 g 200 mL/hr over 30 Minutes Intravenous Every 24 hours 08/13/20 1154 08/20/20 1523   08/09/20 2200  ceFEPIme (MAXIPIME) 1 g in sodium chloride 0.9 % 100 mL IVPB  Status:  Discontinued        1 g 200 mL/hr over 30 Minutes Intravenous Every 24 hours 08/09/20 1238 08/13/20 1154   08/09/20 0000  ceFEPIme (MAXIPIME) 1 g in sodium chloride 0.9 % 100 mL IVPB  Status:  Discontinued        1 g 200 mL/hr over 30 Minutes Intravenous Every 24 hours 08/08/20 2305 08/09/20 1238        Time spent: 25 minutes    Erin Hearing ANP  [facility] 7 am - 330 pm/M-F for direct patient care and secure chat Please refer to Amion for contact info 34  days

## 2020-09-11 NOTE — Progress Notes (Signed)
ANTICOAGULATION CONSULT NOTE - Follow Up Consult  Pharmacy Consult for Warfarin Indication:  mechanical aortic valve    No Known Allergies  Patient Measurements: Height: '6\' 4"'$  (193 cm) Weight: 114.6 kg (252 lb 10.4 oz) IBW/kg (Calculated) : 86.8  Vital Signs: Temp: 98.1 F (36.7 C) (06/20 0805) Temp Source: Oral (06/20 0805) BP: 114/80 (06/20 0805) Pulse Rate: 76 (06/20 0805)  Labs: Recent Labs    09/09/20 0500 09/10/20 0423 09/11/20 0500  HGB  --   --  9.5*  HCT  --   --  31.0*  PLT  --   --  183  LABPROT 25.0* 27.0* 32.2*  INR 2.3* 2.5* 3.1*  CREATININE 3.24* 3.28*  --   CKTOTAL 11* 9* 9*    Estimated Creatinine Clearance: 42.7 mL/min (A) (by C-G formula based on SCr of 3.28 mg/dL (H)).  Assessment: 39yom with recent endocarditis> AV root repair and Mechanical AVR on 08/03/20. Warfarin started 5/13. Left AMA 5/16 pm then returned 5/17 pm. INR 2.6 on 5/18 and Warfarin resumed. Pharmacy was consulted 5/27 (INR 1.8) to bridge with heparin until INR >2.5 again. Heparin stopped 6/1 when INR 3.1.    INR therapeutic at 3.1 with goal INR 2.5-3.5; s/p Lovenox bridge. CBCs every 72 hours, most recent Hgb 9.5 (low/stable), PLTs WNL. No bleeding reported.   Has received 6-'8mg'$  for 5 days, therapeutic dose is likely '8mg'$  x2 days per week, '6mg'$  all other days. Gave '8mg'$  yesterday, will give '6mg'$  tonight.   Goal of Therapy:  INR 2.5-3.5  per Dr. Kipp Brood Monitor platelets by anticoagulation protocol: Yes    Plan:  Give Warfarin 6 mg PO x 1 Daily PT/INR CBC q72 hr Monitor for s/sx of bleeding   Thank you for allowing [place] to participate in this patients care. Jens Som, PharmD 09/11/2020 9:31 AM  Please check [email or web address] for unit-specific pharmacy phone numbers.

## 2020-09-12 DIAGNOSIS — U071 COVID-19: Secondary | ICD-10-CM | POA: Diagnosis not present

## 2020-09-12 DIAGNOSIS — R600 Localized edema: Secondary | ICD-10-CM | POA: Diagnosis not present

## 2020-09-12 DIAGNOSIS — I330 Acute and subacute infective endocarditis: Secondary | ICD-10-CM | POA: Diagnosis not present

## 2020-09-12 DIAGNOSIS — Z952 Presence of prosthetic heart valve: Secondary | ICD-10-CM | POA: Diagnosis not present

## 2020-09-12 LAB — D-DIMER, QUANTITATIVE: D-Dimer, Quant: 2.3 ug/mL-FEU — ABNORMAL HIGH (ref 0.00–0.50)

## 2020-09-12 LAB — COMPREHENSIVE METABOLIC PANEL
ALT: 10 U/L (ref 0–44)
AST: 18 U/L (ref 15–41)
Albumin: 2.5 g/dL — ABNORMAL LOW (ref 3.5–5.0)
Alkaline Phosphatase: 44 U/L (ref 38–126)
Anion gap: 9 (ref 5–15)
BUN: 24 mg/dL — ABNORMAL HIGH (ref 6–20)
CO2: 24 mmol/L (ref 22–32)
Calcium: 9.2 mg/dL (ref 8.9–10.3)
Chloride: 107 mmol/L (ref 98–111)
Creatinine, Ser: 3.16 mg/dL — ABNORMAL HIGH (ref 0.61–1.24)
GFR, Estimated: 25 mL/min — ABNORMAL LOW (ref >60)
Glucose, Bld: 83 mg/dL (ref 70–99)
Potassium: 3.4 mmol/L — ABNORMAL LOW (ref 3.5–5.1)
Sodium: 140 mmol/L (ref 135–145)
Total Bilirubin: 0.9 mg/dL (ref 0.3–1.2)
Total Protein: 7.6 g/dL (ref 6.5–8.1)

## 2020-09-12 LAB — CBC
HCT: 33.2 % — ABNORMAL LOW (ref 39.0–52.0)
Hemoglobin: 10.3 g/dL — ABNORMAL LOW (ref 13.0–17.0)
MCH: 27.1 pg (ref 26.0–34.0)
MCHC: 31 g/dL (ref 30.0–36.0)
MCV: 87.4 fL (ref 80.0–100.0)
Platelets: 185 10*3/uL (ref 150–400)
RBC: 3.8 MIL/uL — ABNORMAL LOW (ref 4.22–5.81)
RDW: 17 % — ABNORMAL HIGH (ref 11.5–15.5)
WBC: 4.3 10*3/uL (ref 4.0–10.5)
nRBC: 0 % (ref 0.0–0.2)

## 2020-09-12 LAB — CK: Total CK: 11 U/L — ABNORMAL LOW (ref 49–397)

## 2020-09-12 LAB — LACTATE DEHYDROGENASE: LDH: 158 U/L (ref 98–192)

## 2020-09-12 LAB — PROTIME-INR
INR: 3.6 — ABNORMAL HIGH (ref 0.8–1.2)
Prothrombin Time: 36.2 seconds — ABNORMAL HIGH (ref 11.4–15.2)

## 2020-09-12 LAB — C-REACTIVE PROTEIN: CRP: 1 mg/dL — ABNORMAL HIGH (ref <1.0)

## 2020-09-12 MED ORDER — WARFARIN SODIUM 2.5 MG PO TABS
2.5000 mg | ORAL_TABLET | Freq: Once | ORAL | Status: AC
  Administered 2020-09-12: 2.5 mg via ORAL
  Filled 2020-09-12: qty 1

## 2020-09-12 NOTE — Progress Notes (Signed)
ANTICOAGULATION CONSULT NOTE - Follow Up Consult  Pharmacy Consult for Warfarin Indication:  mechanical aortic valve    No Known Allergies  Patient Measurements: Height: '6\' 4"'$  (193 cm) Weight: 111.6 kg (246 lb 1.6 oz) IBW/kg (Calculated) : 86.8  Vital Signs: Temp: 98.2 F (36.8 C) (06/21 0958) Temp Source: Oral (06/21 0958) BP: 142/89 (06/21 0958) Pulse Rate: 76 (06/21 0958)  Labs: Recent Labs    09/10/20 0423 09/11/20 0500 09/12/20 0458  HGB  --  9.5* 10.3*  HCT  --  31.0* 33.2*  PLT  --  183 185  LABPROT 27.0* 32.2* 36.2*  INR 2.5* 3.1* 3.6*  CREATININE 3.28*  --  3.16*  CKTOTAL 9* 9* 11*    Estimated Creatinine Clearance: 43.8 mL/min (A) (by C-G formula based on SCr of 3.16 mg/dL (H)).  Assessment: 39yom with recent endocarditis> AV root repair and Mechanical AVR on 08/03/20. Warfarin started 5/13. Left AMA 5/16 pm then returned 5/17 pm. INR 2.6 on 5/18 and Warfarin resumed. Pharmacy was consulted 5/27 (INR 1.8) to bridge with heparin until INR >2.5 again. Heparin stopped 6/1 when INR 3.1.    INR therapeutic at 3.1 with goal INR 2.5-3.5; s/p Lovenox bridge. CBCs every 72 hours, most recent Hgb 9.5 (low/stable), PLTs WNL. No bleeding reported.   INR trended to 3.6 today which is slightly above goal. We will give a smaller dose today. Hgb stable 10.3  Goal of Therapy:  INR 2.5-3.5  per Dr. Kipp Brood Monitor platelets by anticoagulation protocol: Yes    Plan:  Warfarin 2.5 mg PO x 1 Daily PT/INR CBC q72 hr Monitor for s/sx of bleeding  Onnie Boer, PharmD, BCIDP, AAHIVP, CPP Infectious Disease Pharmacist 09/12/2020 11:23 AM

## 2020-09-12 NOTE — Progress Notes (Signed)
[facility] PROGRESS NOTE  Alex [name] [id number] DOB: 1981/01/06 DOA: 08/08/2020 PCP: [facility]  Status: Remains inpatient appropriate because:Unsafe d/c plan, IV treatments appropriate due to intensity of illness or inability to take PO and Inpatient level of care appropriate due to severity of illness. IVDA SO NOT AN APPROPRIATE CANDIDATE FOR HH ANTIBIOTICS VIA PICC LINE   Dispo: The patient is from: Home              Anticipated d/c is to: Home 6/25              Patient currently is not medically stable to d/c.   Difficult to place patient Yes  **Discussed discharge plans with patient's mother.  Multiple questions answered.  She is aware that I have instructed the patient to go ahead and notify his PCP who is also the provider for his Suboxone.  Will need at least a 7-day refill of Suboxone provided by his PCP and will need to follow-up with PCP in 1 week.  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  I will give him a note that states will be released at the discretion of his primary care physician after initial follow-up visit  Level of care: Med-Surg  Code Status: Full Family Communication: 6/15 mother at bedside DVT prophylaxis: Warfarin plus Lovenox COVID vaccination status: Received first dose of Pfizer on 08/16/2020-he developed acute COVID infection during hospitalization therefore will need to wait 3 more months before receiving second vaccination  HPI: 40 year old male with history of polysubstance abuse, aortic valve endocarditis, Serratia bacteremia, septic emboli causing stroke and acute kidney injury requiring hemodialysis.  Patient left hospital AMA on 08/08/2020.  He came back same night after discussion with CT surgery.  Since admission patient has improved significantly.  Renal function has returned to quint discontinuation of dialysis and hemodialysis  catheter by the nephrology team.  His last set of blood cultures were negative on 5/7.  Because of poor venous access and midline catheter has been placed. 6/15 patient was found to have fevers and sore throat and has tested positive for COVID.  He is stable from a respiratory standpoint and has mild elevation of inflammatory markers.  Plan is to administer IV remdesivir x3 days only.  Significant events:  4/26 transfer from [facility] to [facility] CT head 4/26 >> no acute findings CT abd/pelvis 4/26 >> cholelithiasis w/o inflammatory chagnes, mild hepatomegaly (25 cm), moderate splenomegaly (16.7 cm), 2 mm non obstructing kidney stone on Lt 4/27 CVL placed. Pressors escalating.  4/28 intubated for TEE, CVC, HD, and A-line placed. CRRT started  Remains on CRRT MRI on 4/30-multiple infarcts 5/4: repeat TEE shows an aortic valve abscess 5/5: extubated , self-removed HD cath so CRRT holiday> developed severe hyperkalemia overnight. 5/6: new temp HD cath placed  5/8: new temp HD cath placed after prior one self-removed by patient 5/9: Orthopantogram neg for abscess 5/10: HD cath replaced by IR [facility] 5/12 to OR for AVR, aortic root reconstruction, closure of aortic sinus track. To 2H ICU on vent post op.  4 units PRBC given intraop.  5/13 extubated, on CRRT, remains on pressors 5/18, left AMA. 5/30 continues on intermittent hemodialysis as directed by nephrology team.  Continues with excellent urinary output.  Creatinine between 2.9 and 4.5  Subjective: Patient alert.  Has just completed shower with no reports of shortness of breath or pleuritic chest  pain.  Objective: Vitals:   09/11/20 2350 09/12/20 0527  BP: 120/86 121/85  Pulse: 98 98  Resp:  16  Temp: 97.6 F (36.4 C) (!) 97.2 F (36.2 C)  SpO2: 98% 98%    Intake/Output Summary (Last 24 hours) at 09/12/2020 0755 Last data filed at 09/11/2020 1842 Gross per 24 hour  Intake --  Output 1600 ml  Net -1600 ml   Filed Weights   09/08/20  0500 09/10/20 0400 09/12/20 0527  Weight: 117.3 kg 114.6 kg 111.6 kg    Exam:  Constitutional: Alert and in no acute distress Respiratory: Lungs remain clear, room air Cardiac: Normal heart sounds, normotensive, no peripheral edema Abdomen:  LBM 6/19, soft with normal bowel sounds.  Eating better. Neurologic: CN 2-12 grossly intact. Sensation intact, DTR normal. Strength 5/5 x all 4 extremities.  Psychiatric: Oriented x3.  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   -Presented with sepsis physiology required short-term mechanical ventilation   -MRI brain (07/22/2020): multiple acute infarcts.   -Blood cultures: Serratia marcescens -TEE: severe AI and AR with AV vegetation    -S/p mechanical aortic valve replacement, aortic root construction and closure of aortic sinus tract.   -Needs 6 weeks of cefepime per ID with end date 09/15/20 at 1700  -Coumadin per pharmacy.  Will need to establish with cardiology in [place] re: Coumadin clinic-ambulatory referral has been sent -Briefly required Lovenox bridge in context of hypercoagulable state from [place] with Dr. Kipp Brood after discharge for routine postoperative visit -Has follow-up ID appointment in place with Dr. Montel Culver on 6/28-contact ID team to clarify if patient needs additional oral antibiotics after discharge   Acute COVID-19 infection -Patient has tested positive for COVID-19 on 6/15-anticipate 10-day quarantine with last date 6/25 -O2 sats 96%; TM 99.9 -Mild COVID without hypoxemia -remdesivir for 3 days- Follow LFTs -CRP trend: 3.4 > 4.2>4.4 > 3.0 > 1.7 > 1.5 > 1.0 -LDH WNLs -D dimer: 2.9>2.57 > 2.47 > 2.29 > 2.29 > 2.3  Status post recent mechanical aortic valve replacement -Continue warfarin  Recurrent Anemia/thrombocytopenia -patient is s/p 3 units PRBC during this hospital  stay.   -Hemoglobin stable-infuse only if hemoglobin<7 -Continue oral iron; will discharge-was also given a dose of IV iron this admission -Platelets are normal  Atrial fibrillation/flutter with RVR -Evaluated by cardiology -Maintaining sinus rhythm on CCB and BB  -Continue warfarin -patient will need to follow-up with cardiology in [place] to establish at their Coumadin clinic  Acute kidney injury -likely from ATN 2/2 acute infection and sepsis physiology -Last HD 5/30  -Creatinine stable 3.45 on 6/15-we will check electrolyte panel tomorrow on 6/18  Polysubstance abuse -patient has amphetamine abuse, nicotine dependence, opiate dependence on agonist therapy.   -He was on chronic Suboxone therapy at a clinic in [facility] prior to admission. -He told this Probation officer that he will no longer utilize drugs and that most recent circumstances of this admission has convinced him that the risks are too great to continue using IV drugs  Bilateral lower extremity edema -Improved after given several days of twice daily oral Lasix-Lasix placed on hold in context of acute COVID infection  Obesity BMI-34.14kg/m    Data Reviewed: Basic Metabolic Panel: Recent Labs  Lab 09/06/20 1345 09/09/20 0500 09/10/20 0423 09/12/20 0458  NA 136 140 139 140  K 3.7 3.3* 3.5 3.4*  CL 100 102 103 107  CO2 27  $'27 25 24  'd$ GLUCOSE 90 76 76 83  BUN 28* 31* 29* 24*  CREATININE 3.45* 3.24* 3.28* 3.16*  CALCIUM 8.7* 8.6* 8.7* 9.2   Liver Function Tests: Recent Labs  Lab 09/08/20 0345 09/09/20 0500 09/10/20 0423 09/11/20 0500 09/12/20 0458  AST '24 26 22 19 18  '$ ALT '11 11 11 9 10  '$ ALKPHOS 45 43 38 39 44  BILITOT 1.0 0.9 1.0 0.9 0.9  PROT 7.1 7.1 7.1 7.1 7.6  ALBUMIN 2.3* 2.4* 2.4* 2.5* 2.5*      CBC: Recent Labs  Lab 09/06/20 0425 09/07/20 0321 09/08/20 0345 09/11/20 0500 09/12/20 0458  WBC 4.9 6.6 4.5 3.9* 4.3  NEUTROABS  --  4.2  --   --   --   HGB 8.3* 10.0* 9.3* 9.5*  10.3*  HCT 27.3* 32.7* 30.3* 31.0* 33.2*  MCV 88.1 87.4 87.1 87.8 87.4  PLT 234 274 217 183 185    BNP (last 3 results) Recent Labs    07/25/20 0334 07/26/20 0331  BNP 153.5* 76.0     Continuous Infusions:  ceFEPime (MAXIPIME) IV 2 g (09/12/20 0609)    Principal Problem:   Endocarditis Active Problems:   Opioid use disorder   Acute renal failure with oliguria (HCC)   Cerebral embolism with cerebral infarction   Anemia   Acute kidney injury (AKI) with acute tubular necrosis (ATN) (HCC)   Atrial fibrillation with RVR (HCC)   Typical atrial flutter (HCC)   Bilateral lower extremity edema   Status post mechanical aortic valve replacement   Abscess of aorta (HCC)   Fever, unspecified   Acute COVID-19   Consultants: Infectious disease PCCM Neurology CT surgery  Procedures: Echocardiogram TEE CRRT Cortrack  Antibiotics: Anti-infectives (From admission, onward)    Start     Dose/Rate Route Frequency Ordered Stop   09/07/20 1051  remdesivir 100 mg in sodium chloride 0.9 % 100 mL IVPB       See Hyperspace for full Linked Orders Report.   100 mg 200 mL/hr over 30 Minutes Intravenous Daily 09/07/20 1051 09/08/20 1115   09/07/20 1000  remdesivir 100 mg in sodium chloride 0.9 % 100 mL IVPB  Status:  Discontinued       See Hyperspace for full Linked Orders Report.   100 mg 200 mL/hr over 30 Minutes Intravenous Daily 09/06/20 1328 09/07/20 1051   09/06/20 1430  remdesivir 200 mg in sodium chloride 0.9% 250 mL IVPB       See Hyperspace for full Linked Orders Report.   200 mg 580 mL/hr over 30 Minutes Intravenous Once 09/06/20 1328 09/06/20 1918   09/04/20 2200  ceFEPIme (MAXIPIME) 2 g in sodium chloride 0.9 % 100 mL IVPB        2 g 200 mL/hr over 30 Minutes Intravenous Every 12 hours 09/04/20 0931 09/16/20 0659   09/03/20 1000  levofloxacin (LEVAQUIN) tablet 750 mg  Status:  Discontinued        750 mg Oral Every other day 09/02/20 1653 09/04/20 0931   09/02/20 1745   levofloxacin (LEVAQUIN) tablet 750 mg        750 mg Oral  Once 09/02/20 1653 09/02/20 1819   08/25/20 1800  ceFEPIme (MAXIPIME) 2 g in sodium chloride 0.9 % 100 mL IVPB  Status:  Discontinued        2 g 200 mL/hr over 30 Minutes Intravenous Every 12 hours 08/25/20 1618 09/02/20 1653   08/20/20 2200  ceFEPIme (MAXIPIME) 1 g in sodium chloride  0.9 % 100 mL IVPB  Status:  Discontinued        1 g 200 mL/hr over 30 Minutes Intravenous Every 24 hours 08/20/20 1523 08/25/20 1618   08/13/20 2200  ceFEPIme (MAXIPIME) 2 g in sodium chloride 0.9 % 100 mL IVPB  Status:  Discontinued        2 g 200 mL/hr over 30 Minutes Intravenous Every 24 hours 08/13/20 1154 08/20/20 1523   08/09/20 2200  ceFEPIme (MAXIPIME) 1 g in sodium chloride 0.9 % 100 mL IVPB  Status:  Discontinued        1 g 200 mL/hr over 30 Minutes Intravenous Every 24 hours 08/09/20 1238 08/13/20 1154   08/09/20 0000  ceFEPIme (MAXIPIME) 1 g in sodium chloride 0.9 % 100 mL IVPB  Status:  Discontinued        1 g 200 mL/hr over 30 Minutes Intravenous Every 24 hours 08/08/20 2305 08/09/20 1238        Time spent: 25 minutes    Erin Hearing ANP  [facility] 7 am - 330 pm/M-F for direct patient care and secure chat Please refer to Amion for contact info 35  days

## 2020-09-13 LAB — PROTIME-INR
INR: 4.6 (ref 0.8–1.2)
Prothrombin Time: 43.3 seconds — ABNORMAL HIGH (ref 11.4–15.2)

## 2020-09-13 LAB — HEPATIC FUNCTION PANEL
ALT: 8 U/L (ref 0–44)
AST: 17 U/L (ref 15–41)
Albumin: 2.6 g/dL — ABNORMAL LOW (ref 3.5–5.0)
Alkaline Phosphatase: 42 U/L (ref 38–126)
Bilirubin, Direct: <0.1 mg/dL (ref 0.0–0.2)
Total Bilirubin: 0.8 mg/dL (ref 0.3–1.2)
Total Protein: 7.6 g/dL (ref 6.5–8.1)

## 2020-09-13 LAB — C-REACTIVE PROTEIN: CRP: 0.8 mg/dL (ref <1.0)

## 2020-09-13 LAB — D-DIMER, QUANTITATIVE: D-Dimer, Quant: 2.05 ug/mL-FEU — ABNORMAL HIGH (ref 0.00–0.50)

## 2020-09-13 LAB — LACTATE DEHYDROGENASE: LDH: 157 U/L (ref 98–192)

## 2020-09-13 LAB — CK: Total CK: 11 U/L — ABNORMAL LOW (ref 49–397)

## 2020-09-13 NOTE — Progress Notes (Signed)
[facility] PROGRESS NOTE  SEYDOU ASANO [id number] DOB: 11-14-1980 DOA: 08/08/2020 PCP: [facility]  Status: Remains inpatient appropriate because:Unsafe d/c plan, IV treatments appropriate due to intensity of illness or inability to take PO and Inpatient level of care appropriate due to severity of illness. IVDA SO NOT AN APPROPRIATE CANDIDATE FOR HH ANTIBIOTICS VIA PICC LINE   Dispo: The patient is from: Home              Anticipated d/c is to: Home 6/25              Patient currently is not medically stable to d/c.   Difficult to place patient Yes  **Discussed discharge plans with patient's mother.  Multiple questions answered.  She is aware that I have instructed the patient to go ahead and notify his PCP who is also the provider for his Suboxone.  Will need at least a 7-day refill of Suboxone provided by his PCP and will need to follow-up with PCP in 1 week.  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  I will give him a note that states will be released at the discretion of his primary care physician after initial follow-up visit  Level of care: Med-Surg  Code Status: Full Family Communication: 6/15 mother at bedside DVT prophylaxis: Warfarin plus Lovenox COVID vaccination status: Received first dose of Pfizer on 08/16/2020-he developed acute COVID infection during hospitalization therefore will need to wait 3 more months before receiving second vaccination  HPI: 40 year old male with history of polysubstance abuse, aortic valve endocarditis, Serratia bacteremia, septic emboli causing stroke and acute kidney injury requiring hemodialysis.  Patient left hospital AMA on 08/08/2020.  He came back same night after discussion with CT surgery.  Since admission patient has improved significantly.  Renal function has returned to quint discontinuation of dialysis and hemodialysis  catheter by the nephrology team.  His last set of blood cultures were negative on 5/7.  Because of poor venous access and midline catheter has been placed. 6/15 patient was found to have fevers and sore throat and has tested positive for COVID.  He is stable from a respiratory standpoint and has mild elevation of inflammatory markers.  Plan is to administer IV remdesivir x3 days only.  Significant events:  4/26 transfer from [facility] to [facility] CT head 4/26 >> no acute findings CT abd/pelvis 4/26 >> cholelithiasis w/o inflammatory chagnes, mild hepatomegaly (25 cm), moderate splenomegaly (16.7 cm), 2 mm non obstructing kidney stone on Lt 4/27 CVL placed. Pressors escalating.  4/28 intubated for TEE, CVC, HD, and A-line placed. CRRT started  Remains on CRRT MRI on 4/30-multiple infarcts 5/4: repeat TEE shows an aortic valve abscess 5/5: extubated , self-removed HD cath so CRRT holiday> developed severe hyperkalemia overnight. 5/6: new temp HD cath placed  5/8: new temp HD cath placed after prior one self-removed by patient 5/9: Orthopantogram neg for abscess 5/10: HD cath replaced by IR [facility] 5/12 to OR for AVR, aortic root reconstruction, closure of aortic sinus track. To 2H ICU on vent post op.  4 units PRBC given intraop.  5/13 extubated, on CRRT, remains on pressors 5/18, left AMA. 5/30 continues on intermittent hemodialysis as directed by nephrology team.  Continues with excellent urinary output.  Creatinine between 2.9 and 4.5  Subjective: Alert.  No trouble breathing.  Very eager to discharge home noting discharge date is this Friday  6/24 after last dose of cefepime  Objective: Vitals:   09/13/20 0000 09/13/20 0326  BP: 129/78 (!) 152/88  Pulse: 78 63  Resp: 16 18  Temp: 98.4 F (36.9 C) 98 F (36.7 C)  SpO2: 98% 96%    Intake/Output Summary (Last 24 hours) at 09/13/2020 [id number] Last data filed at 09/12/2020 1616 Gross per 24 hour  Intake --  Output 450 ml  Net -450 ml   Filed  Weights   09/10/20 0400 09/12/20 0527 09/13/20 0359  Weight: 114.6 kg 111.6 kg 110.9 kg    Exam:  Constitutional: Calm and in no acute distress Respiratory: Lungs are clear, stable on room air with normal pulse oximetry reading Cardiac: Normotensive, no tachycardia, no peripheral edema Abdomen:  LBM 6/22, soft nontender nondistended.  Eating 100% of diet Neurologic: CN 2-12 grossly intact. Sensation intact, DTR normal. Strength 5/5 x all 4 extremities.  Psychiatric: Alert and oriented x3.  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   -Presented with sepsis physiology required short-term mechanical ventilation   -MRI brain (07/22/2020): multiple acute infarcts.   -Blood cultures: Serratia marcescens -TEE: severe AI and AR with AV vegetation    -S/p mechanical aortic valve replacement, aortic root construction and closure of aortic sinus tract.   -Needs 6 weeks of cefepime per ID with end date 09/15/20 at 1700  -Coumadin per pharmacy.  Will need to establish with cardiology in [place] re: Coumadin clinic-ambulatory referral has been sent -Briefly required Lovenox bridge in context of hypercoagulable state from COVID-INR now trending upward-pharmacy managing dosing -Follow-up with Dr. Kipp Brood after discharge for routine postoperative visit -Has follow-up ID appointment in place with Dr. Montel Culver on 6/28-contact ID team to clarify if patient needs additional oral antibiotics after discharge   Acute COVID-19 infection -Patient has tested positive for COVID-19 on 6/15-anticipate 10-day quarantine with last date 6/25 -O2 sats 96%; TM 99.9 -Mild COVID without hypoxemia -remdesivir for 3 days- Follow LFTs -CRP trend: 3.4 > 4.2>4.4 > 3.0 > 1.7 > 1.5 > 1.0 > 2.8 -LDH WNLs -D dimer: 2.9>2.57 > 2.47 > 2.29 > 2.29 > 2.3 > 2.05  Status post recent  mechanical aortic valve replacement -Continue warfarin  Recurrent Anemia/thrombocytopenia -patient is s/p 3 units PRBC during this hospital stay.   -Hemoglobin stable-infuse only if hemoglobin<7 -Continue oral iron after discharge -Recommend follow-up anemia panel 6 to 8 weeks after discharge  Atrial fibrillation/flutter with RVR -Evaluated by cardiology -Maintaining sinus rhythm on CCB and BB  -Continue warfarin; ambulatory referral sent to cardiology in [place] for Coumadin clinic  Acute kidney injury -likely from ATN 2/2 acute infection and sepsis physiology -Last HD 5/30  -Creatinine stable and down to 3.16 on 6/21  Polysubstance abuse -patient has amphetamine abuse, nicotine dependence, opiate dependence on agonist therapy.   -He was on chronic Suboxone therapy at a clinic in [facility] prior to admission. -He told this Probation officer that he will no longer utilize drugs and that most recent circumstances of this admission has convinced him that the risks are too great to continue using IV drugs  Bilateral lower extremity edema -Resolved after several doses of oral Lasix  Obesity BMI-34.14kg/m    Data Reviewed: Basic Metabolic Panel: Recent Labs  Lab 09/06/20 1345 09/09/20 0500 09/10/20 0423 09/12/20 0458  NA 136 140 139 140  K 3.7 3.3* 3.5 3.4*  CL 100 102 103 107  CO2 27  $'27 25 24  'o$ GLUCOSE 90 76 76 83  BUN 28* 31* 29* 24*  CREATININE 3.45* 3.24* 3.28* 3.16*  CALCIUM 8.7* 8.6* 8.7* 9.2   Liver Function Tests: Recent Labs  Lab 09/09/20 0500 09/10/20 0423 09/11/20 0500 09/12/20 0458 09/13/20 0330  AST '26 22 19 18 17  '$ ALT '11 11 9 10 8  '$ ALKPHOS 43 38 39 44 42  BILITOT 0.9 1.0 0.9 0.9 0.8  PROT 7.1 7.1 7.1 7.6 7.6  ALBUMIN 2.4* 2.4* 2.5* 2.5* 2.6*      CBC: Recent Labs  Lab 09/07/20 0321 09/08/20 0345 09/11/20 0500 09/12/20 0458  WBC 6.6 4.5 3.9* 4.3  NEUTROABS 4.2  --   --   --   HGB 10.0* 9.3* 9.5* 10.3*  HCT 32.7* 30.3* 31.0*  33.2*  MCV 87.4 87.1 87.8 87.4  PLT 274 217 183 185    BNP (last 3 results) Recent Labs    07/25/20 0334 07/26/20 0331  BNP 153.5* 76.0     Continuous Infusions:  ceFEPime (MAXIPIME) IV 2 g (09/13/20 0604)    Principal Problem:   Endocarditis Active Problems:   Opioid use disorder   Acute renal failure with oliguria (HCC)   Cerebral embolism with cerebral infarction   Anemia   Acute kidney injury (AKI) with acute tubular necrosis (ATN) (HCC)   Atrial fibrillation with RVR (HCC)   Typical atrial flutter (HCC)   Bilateral lower extremity edema   Status post mechanical aortic valve replacement   Abscess of aorta (HCC)   Fever, unspecified   Acute COVID-19   Consultants: Infectious disease PCCM Neurology CT surgery  Procedures: Echocardiogram TEE CRRT Cortrack  Antibiotics: Anti-infectives (From admission, onward)    Start     Dose/Rate Route Frequency Ordered Stop   09/07/20 1051  remdesivir 100 mg in sodium chloride 0.9 % 100 mL IVPB       See Hyperspace for full Linked Orders Report.   100 mg 200 mL/hr over 30 Minutes Intravenous Daily 09/07/20 1051 09/08/20 1115   09/07/20 1000  remdesivir 100 mg in sodium chloride 0.9 % 100 mL IVPB  Status:  Discontinued       See Hyperspace for full Linked Orders Report.   100 mg 200 mL/hr over 30 Minutes Intravenous Daily 09/06/20 1328 09/07/20 1051   09/06/20 1430  remdesivir 200 mg in sodium chloride 0.9% 250 mL IVPB       See Hyperspace for full Linked Orders Report.   200 mg 580 mL/hr over 30 Minutes Intravenous Once 09/06/20 1328 09/06/20 1918   09/04/20 2200  ceFEPIme (MAXIPIME) 2 g in sodium chloride 0.9 % 100 mL IVPB        2 g 200 mL/hr over 30 Minutes Intravenous Every 12 hours 09/04/20 0931 09/16/20 0659   09/03/20 1000  levofloxacin (LEVAQUIN) tablet 750 mg  Status:  Discontinued        750 mg Oral Every other day 09/02/20 1653 09/04/20 0931   09/02/20 1745  levofloxacin (LEVAQUIN) tablet 750 mg         750 mg Oral  Once 09/02/20 1653 09/02/20 1819   08/25/20 1800  ceFEPIme (MAXIPIME) 2 g in sodium chloride 0.9 % 100 mL IVPB  Status:  Discontinued        2 g 200 mL/hr over 30 Minutes Intravenous Every 12 hours 08/25/20 1618 09/02/20 1653   08/20/20 2200  ceFEPIme (MAXIPIME) 1 g in sodium chloride 0.9 % 100 mL IVPB  Status:  Discontinued  1 g 200 mL/hr over 30 Minutes Intravenous Every 24 hours 08/20/20 1523 08/25/20 1618   08/13/20 2200  ceFEPIme (MAXIPIME) 2 g in sodium chloride 0.9 % 100 mL IVPB  Status:  Discontinued        2 g 200 mL/hr over 30 Minutes Intravenous Every 24 hours 08/13/20 1154 08/20/20 1523   08/09/20 2200  ceFEPIme (MAXIPIME) 1 g in sodium chloride 0.9 % 100 mL IVPB  Status:  Discontinued        1 g 200 mL/hr over 30 Minutes Intravenous Every 24 hours 08/09/20 1238 08/13/20 1154   08/09/20 0000  ceFEPIme (MAXIPIME) 1 g in sodium chloride 0.9 % 100 mL IVPB  Status:  Discontinued        1 g 200 mL/hr over 30 Minutes Intravenous Every 24 hours 08/08/20 2305 08/09/20 1238        Time spent: 25 minutes    Erin Hearing ANP  [facility] 7 am - 330 pm/M-F for direct patient care and secure chat Please refer to Amion for contact info 36  days

## 2020-09-13 NOTE — Progress Notes (Signed)
Nutrition Follow-up  DOCUMENTATION CODES:   Not applicable  INTERVENTION:   -D/c Prosource Plus -Continue MVI with minerals daily -Double protein portions with meals -Magic cup TID with meals, each supplement provides 290 kcal and 9 grams of protein   NUTRITION DIAGNOSIS:   Increased nutrient needs related to post-op healing as evidenced by estimated needs.  Ongoing  GOAL:   Patient will meet greater than or equal to 90% of their needs  Progressing   MONITOR:   PO intake, Supplement acceptance, Weight trends, Labs, I & O's  REASON FOR ASSESSMENT:   Rounds    ASSESSMENT:   Patient with PMH significant for polysubstance use, ruptured lumbar disc, and recent admission for aortic valve endocarditis with perivalvular abscess (see time line below). Left AMA and presents back to ED with AKI and back/knee pain from mechanical fall.  4/28 - intubated, TEE, CRRT initiated 4/29 - Cortrak placed (gastric tip) 5/04 - TEE 5/05 - extubated to BiPAP, CRRT discontinued (pt pulled dialysis catheter) 5/06 - CRRT restarted due to severe hyperkalemia 5/10 - s/p tunneled HD catheter placement 5/12 - s/p AVR, aortic root reconstruction, closure of aortic sinus track 5/16 - transition to iHD 5/17 - left AMA 5/30 - Last HD  Reviewed I/O's: -450 ml x 24 hours and -33.8 L since 08/30/20  UOP: 450 ml x 24 hours  Pt now COVID +.   Pt remains with good appetite. Noted meal completion 100%. He is refusing ordered Prosource supplements.   Per MD notes, plan to discharge after completion of IV antibiotics (09/15/20).   Medications reviewed and include vitamin C, aranesp, cardizem, melatonin, miralax, senokot, and zinc sulfate.   Labs reviewed: K: 3.2.   Diet Order:   Diet Order             Diet Heart Room service appropriate? Yes; Fluid consistency: Thin; Fluid restriction: 1500 mL Fluid  Diet effective now                   EDUCATION NEEDS:   Education needs have been  addressed  Skin:  Skin Integrity Issues:: Other (Comment), Unstageable, Incisions Unstageable: coccyx, R buttocks Incisions: chest Other: MASD- perineum  Last BM:  09/13/20  Height:   Ht Readings from Last 1 Encounters:  08/08/20 '6\' 4"'$  (1.93 m)    Weight:   Wt Readings from Last 1 Encounters:  09/13/20 110.9 kg    Ideal Body Weight:  91.8 kg  BMI:  Body mass index is 29.77 kg/m.  Estimated Nutritional Needs:   Kcal:  2500-2700 kcal  Protein:  120-140 grams  Fluid:  1000 ml + UOP    Loistine Chance, RD, LDN, [place] Registered Dietitian II Certified Diabetes Care and Education Specialist Please refer to [facility] for RD and/or RD on-call/weekend/after hours pager

## 2020-09-13 NOTE — Progress Notes (Signed)
ANTICOAGULATION CONSULT NOTE - Follow Up Consult  Pharmacy Consult for Warfarin Indication:  mechanical aortic valve    No Known Allergies  Patient Measurements: Height: '6\' 4"'$  (193 cm) Weight: 110.9 kg (244 lb 9.6 oz) IBW/kg (Calculated) : 86.8  Vital Signs: Temp: 98.3 F (36.8 C) (06/22 0902) Temp Source: Oral (06/22 0902) BP: 122/97 (06/22 0902) Pulse Rate: 70 (06/22 0902)  Labs: Recent Labs    09/11/20 0500 09/12/20 0458 09/13/20 0330  HGB 9.5* 10.3*  --   HCT 31.0* 33.2*  --   PLT 183 185  --   LABPROT 32.2* 36.2* 43.3*  INR 3.1* 3.6* 4.6*  CREATININE  --  3.16*  --   CKTOTAL 9* 11* 11*    Estimated Creatinine Clearance: 43.7 mL/min (A) (by C-G formula based on SCr of 3.16 mg/dL (H)).  Assessment: 39yom with recent endocarditis> AV root repair and Mechanical AVR on 08/03/20. Warfarin started 5/13. Left AMA 5/16 pm then returned 5/17 pm. INR 2.6 on 5/18 and Warfarin resumed. Pharmacy was consulted 5/27 (INR 1.8) to bridge with heparin until INR >2.5 again. Heparin stopped 6/1 when INR 3.1.    INR therapeutic at 3.1 with goal INR 2.5-3.5; s/p Lovenox bridge. CBCs every 72 hours, most recent Hgb 9.5 (low/stable), PLTs WNL. No bleeding reported.   INR trended again to 4.6 today. His INR has been somewhat erratic. We will hold dose today and let it trends down. Hgb stable 10.3  Goal of Therapy:  INR 2.5-3.5  per Dr. Kipp Brood Monitor platelets by anticoagulation protocol: Yes    Plan:  No coumadin today Daily PT/INR CBC q72 hr Monitor for s/sx of bleeding  Onnie Boer, PharmD, BCIDP, AAHIVP, CPP Infectious Disease Pharmacist 09/13/2020 10:10 AM

## 2020-09-14 ENCOUNTER — Other Ambulatory Visit (HOSPITAL_COMMUNITY): Payer: Self-pay

## 2020-09-14 LAB — CBC
HCT: 33.3 % — ABNORMAL LOW (ref 39.0–52.0)
Hemoglobin: 10.3 g/dL — ABNORMAL LOW (ref 13.0–17.0)
MCH: 26.8 pg (ref 26.0–34.0)
MCHC: 30.9 g/dL (ref 30.0–36.0)
MCV: 86.7 fL (ref 80.0–100.0)
Platelets: 208 10*3/uL (ref 150–400)
RBC: 3.84 MIL/uL — ABNORMAL LOW (ref 4.22–5.81)
RDW: 17.4 % — ABNORMAL HIGH (ref 11.5–15.5)
WBC: 5.8 10*3/uL (ref 4.0–10.5)
nRBC: 0 % (ref 0.0–0.2)

## 2020-09-14 LAB — HEPATIC FUNCTION PANEL
ALT: 9 U/L (ref 0–44)
AST: 15 U/L (ref 15–41)
Albumin: 2.5 g/dL — ABNORMAL LOW (ref 3.5–5.0)
Alkaline Phosphatase: 41 U/L (ref 38–126)
Bilirubin, Direct: 0.1 mg/dL (ref 0.0–0.2)
Indirect Bilirubin: 0.7 mg/dL (ref 0.3–0.9)
Total Bilirubin: 0.8 mg/dL (ref 0.3–1.2)
Total Protein: 7.5 g/dL (ref 6.5–8.1)

## 2020-09-14 LAB — C-REACTIVE PROTEIN: CRP: 0.7 mg/dL (ref <1.0)

## 2020-09-14 LAB — LACTATE DEHYDROGENASE: LDH: 141 U/L (ref 98–192)

## 2020-09-14 LAB — D-DIMER, QUANTITATIVE: D-Dimer, Quant: 1.8 ug/mL-FEU — ABNORMAL HIGH (ref 0.00–0.50)

## 2020-09-14 LAB — PROTIME-INR
INR: 4.8 (ref 0.8–1.2)
Prothrombin Time: 44.8 seconds — ABNORMAL HIGH (ref 11.4–15.2)

## 2020-09-14 LAB — CK: Total CK: 10 U/L — ABNORMAL LOW (ref 49–397)

## 2020-09-14 MED ORDER — DILTIAZEM HCL ER COATED BEADS 180 MG PO CP24
180.0000 mg | ORAL_CAPSULE | Freq: Every day | ORAL | 0 refills | Status: DC
  Filled 2020-09-14: qty 30, 30d supply, fill #0

## 2020-09-14 MED ORDER — ACETAMINOPHEN 325 MG PO TABS: 650.0000 mg | ORAL_TABLET | Freq: Four times a day (QID) | ORAL | Status: DC | PRN

## 2020-09-14 MED ORDER — ADULT MULTIVITAMIN W/MINERALS CH: 1.0000 | ORAL_TABLET | Freq: Every day | ORAL | Status: DC

## 2020-09-14 MED ORDER — LIDOCAINE 5 % EX PTCH
1.0000 | MEDICATED_PATCH | CUTANEOUS | 0 refills | Status: DC
Start: 2020-09-14 — End: 2020-11-13
  Filled 2020-09-14: qty 30, 30d supply, fill #0

## 2020-09-14 MED ORDER — WARFARIN SODIUM 5 MG PO TABS
ORAL_TABLET | ORAL | 11 refills | Status: DC
  Filled 2020-09-14: qty 30, 30d supply, fill #0

## 2020-09-14 MED ORDER — IRON POLYSACCH CMPLX-B12-FA 150-0.025-1 MG PO CAPS
150.0000 mg | ORAL_CAPSULE | Freq: Every day | ORAL | 0 refills | Status: DC
  Filled 2020-09-14: qty 30, 30d supply, fill #0

## 2020-09-14 MED ORDER — METHOCARBAMOL 500 MG PO TABS
500.0000 mg | ORAL_TABLET | Freq: Three times a day (TID) | ORAL | 0 refills | Status: DC
  Filled 2020-09-14: qty 30, 10d supply, fill #0

## 2020-09-14 MED ORDER — MELATONIN 3 MG PO TABS: 3.0000 mg | ORAL_TABLET | Freq: Every day | ORAL | 0 refills | Status: DC

## 2020-09-14 MED ORDER — SENNOSIDES-DOCUSATE SODIUM 8.6-50 MG PO TABS
1.0000 | ORAL_TABLET | Freq: Two times a day (BID) | ORAL | 0 refills | Status: DC
  Filled 2020-09-14: qty 60, 30d supply, fill #0

## 2020-09-14 NOTE — Progress Notes (Signed)
ANTICOAGULATION CONSULT NOTE - Follow Up Consult  Pharmacy Consult for Warfarin Indication:  mechanical aortic valve    No Known Allergies  Patient Measurements: Height: '6\' 4"'$  (193 cm) Weight: 108 kg (238 lb 1.6 oz) IBW/kg (Calculated) : 86.8  Vital Signs: Temp: 97.6 F (36.4 C) (06/23 0751) Temp Source: Oral (06/23 0751) BP: 122/79 (06/23 0751) Pulse Rate: 67 (06/23 0751)  Labs: Recent Labs    09/12/20 0458 09/13/20 0330 09/14/20 0320  HGB 10.3*  --  10.3*  HCT 33.2*  --  33.3*  PLT 185  --  208  LABPROT 36.2* 43.3* 44.8*  INR 3.6* 4.6* 4.8*  CREATININE 3.16*  --   --   CKTOTAL 11* 11* 10*    Estimated Creatinine Clearance: 43.1 mL/min (A) (by C-G formula based on SCr of 3.16 mg/dL (H)).  Assessment: 39yom with recent endocarditis> AV root repair and Mechanical AVR on 08/03/20. Warfarin started 5/13. Left AMA 5/16 pm then returned 5/17 pm. INR 2.6 on 5/18 and Warfarin resumed. Pharmacy was consulted 5/27 (INR 1.8) to bridge with heparin until INR >2.5 again. Heparin stopped 6/1 when INR 3.1.    Goal INR 2.5-3.5; s/p Lovenox bridge. CBCs every 72 hours, most recent Hgb 10.3 (low/stable), PLTs WNL. No bleeding reported.   INR trended up again today to 4.8.  Unsure what is causing acute rise in INR.  No Coumadin given yesterday.    Goal of Therapy:  INR 2.5-3.5  per Dr. Kipp Brood Monitor platelets by anticoagulation protocol: Yes    Plan:  No coumadin again today Daily PT/INR CBC q72 hr Monitor for s/sx of bleeding  [place] Crane, Vena [place], BCPS, BCCP Clinical Pharmacist  09/14/2020 9:22 AM   [facility] pharmacy phone numbers are listed on [email or web address]

## 2020-09-14 NOTE — Progress Notes (Signed)
[facility] PROGRESS NOTE  DERCK POESCHL [id number] DOB: 07-12-1980 DOA: 08/08/2020 PCP: [facility]  Status: Remains inpatient appropriate because:Unsafe d/c plan, IV treatments appropriate due to intensity of illness or inability to take PO and Inpatient level of care appropriate due to severity of illness. IVDA SO NOT AN APPROPRIATE CANDIDATE FOR HH ANTIBIOTICS VIA PICC LINE   Dispo: The patient is from: Home              Anticipated d/c is to: Home 6/25              Patient currently is not medically stable to d/c.   Difficult to place patient Yes  **Discussed discharge plans with patient's mother.  Multiple questions answered.  She is aware that I have instructed the patient to go ahead and notify his PCP who is also the provider for his Suboxone.  Will need at least a 7-day refill of Suboxone provided by his PCP and will need to follow-up with PCP in 1 week.  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  I will give him a note that states will be released at the discretion of his primary care physician after initial follow-up visit  Level of care: Med-Surg  Code Status: Full Family Communication: 6/15 mother at bedside DVT prophylaxis: Warfarin plus Lovenox COVID vaccination status: Received first dose of Pfizer on 08/16/2020-he developed acute COVID infection during hospitalization therefore will need to wait 3 more months before receiving second vaccination  HPI: 40 year old male with history of polysubstance abuse, aortic valve endocarditis, Serratia bacteremia, septic emboli causing stroke and acute kidney injury requiring hemodialysis.  Patient left hospital AMA on 08/08/2020.  He came back same night after discussion with CT surgery.  Since admission patient has improved significantly.  Renal function has returned to quint discontinuation of dialysis and hemodialysis  catheter by the nephrology team.  His last set of blood cultures were negative on 5/7.  Because of poor venous access and midline catheter has been placed. 6/15 patient was found to have fevers and sore throat and has tested positive for COVID.  He is stable from a respiratory standpoint and has mild elevation of inflammatory markers.  Plan is to administer IV remdesivir x3 days only.  Significant events:  4/26 transfer from [facility] to [facility] CT head 4/26 >> no acute findings CT abd/pelvis 4/26 >> cholelithiasis w/o inflammatory chagnes, mild hepatomegaly (25 cm), moderate splenomegaly (16.7 cm), 2 mm non obstructing kidney stone on Lt 4/27 CVL placed. Pressors escalating.  4/28 intubated for TEE, CVC, HD, and A-line placed. CRRT started  Remains on CRRT MRI on 4/30-multiple infarcts 5/4: repeat TEE shows an aortic valve abscess 5/5: extubated , self-removed HD cath so CRRT holiday> developed severe hyperkalemia overnight. 5/6: new temp HD cath placed  5/8: new temp HD cath placed after prior one self-removed by patient 5/9: Orthopantogram neg for abscess 5/10: HD cath replaced by IR [facility] 5/12 to OR for AVR, aortic root reconstruction, closure of aortic sinus track. To 2H ICU on vent post op.  4 units PRBC given intraop.  5/13 extubated, on CRRT, remains on pressors 5/18, left AMA. 5/30 continues on intermittent hemodialysis as directed by nephrology team.  Continues with excellent urinary output.  Creatinine between 2.9 and 4.5  Subjective: Taking a nap.  Mild cough but patient denies shortness of breath, fatigue or myalgias  Objective:  Vitals:   09/14/20 0317 09/14/20 0751  BP: 114/78 122/79  Pulse: 78 67  Resp: 18 17  Temp: 98.4 F (36.9 C) 97.6 F (36.4 C)  SpO2: 96% 98%   No intake or output data in the 24 hours ending 09/14/20 0805  Filed Weights   09/12/20 0527 09/13/20 0359 09/14/20 0317  Weight: 111.6 kg 110.9 kg 108 kg    Exam:  Constitutional: Calm, no acute  distress Respiratory: Remain clear and he is stable on room air. Cardiac: S1-S2, no tachycardia.  Normotensive.  Previous peripheral edema has completely resolved Abdomen:  LBM 6/22, soft with normoactive bowel sounds and eating well. Neurologic: CN 2-12 grossly intact. Sensation intact, DTR normal. Strength 5/5 x all 4 extremities.  Psychiatric: Alert and oriented x3.  Pleasant affect.   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   -Presented with sepsis physiology required short-term mechanical ventilation   -MRI brain (07/22/2020): multiple acute infarcts.   -Blood cultures: Serratia marcescens -TEE: severe AI and AR with AV vegetation    -S/p mechanical aortic valve replacement, aortic root construction and closure of aortic sinus tract.   -Needs 6 weeks of cefepime per ID with end date 09/15/20 at 1700  -Coumadin per pharmacy.  Ambulatory referral sent to cardiology office in [place] regarding need for Coumadin clinic after discharge.  Current INR has been somewhat supratherapeutic and recent doses have been held.  Patient will likely need to follow-up soon after discharge either this Monday or Tuesday. -Briefly required Lovenox bridge in context of hypercoagulable state from COVID-INR now trending upward-pharmacy managing dosing -Follow-up with Dr. Kipp Brood after discharge for routine postoperative visit -Has follow-up ID appointment in place with Dr. Montel Culver on 6/28  Acute COVID-19 infection -Patient has tested positive for COVID-19 on 6/15-anticipate 10-day quarantine with last date 6/25 with time to discontinue 9:30 AM -O2 sats 96%; TM 99.9 -Mild COVID without hypoxemia -remdesivir for 3 days- Follow LFTs -CRP trend: 3.4 > 4.2>4.4 > 3.0 > 1.7 > 1.5 > 1.0 > 2.8 -LDH WNLs -D dimer: 2.9>2.57 > 2.47 > 2.29 > 2.29 > 2.3 > 2.05  Status post recent mechanical aortic valve  replacement -Continue warfarin-pharmacy assisting with doses and recently due to supratherapeutic INR doses have been held. -[place] to send patient home with a 5 mg tablet and pending INR on date of discharge he may need to continue holding his medication will he follows up with the warfarin clinic either Monday or Tuesday after discharge  Recurrent Anemia/thrombocytopenia -patient is s/p 3 units PRBC during this hospital stay.   -Hemoglobin stable-infuse only if hemoglobin<7 -Continue oral iron after discharge -Recommend follow-up anemia panel 6 to 8 weeks after discharge  Atrial fibrillation/flutter with RVR -Evaluated by cardiology -Maintaining sinus rhythm on CCB and BB  -Continue warfarin; ambulatory referral sent to cardiology in [place] for Coumadin clinic  Acute kidney injury -likely from ATN 2/2 acute infection and sepsis physiology -Last HD 5/30  -Creatinine stable and down to 3.16 on 6/21  Polysubstance abuse -patient has amphetamine abuse, nicotine dependence, opiate dependence on agonist therapy.   -He was on chronic Suboxone therapy at a clinic in [facility] prior to admission. -He told this Probation officer that he will no longer utilize drugs and that most recent circumstances of this admission has convinced him that the risks are too great to continue using IV drugs  Bilateral lower extremity edema -Resolved after several doses of oral  Lasix  Obesity BMI-34.14kg/m    Data Reviewed: Basic Metabolic Panel: Recent Labs  Lab 09/09/20 0500 09/10/20 0423 09/12/20 0458  NA 140 139 140  K 3.3* 3.5 3.4*  CL 102 103 107  CO2 '27 25 24  '$ GLUCOSE 76 76 83  BUN 31* 29* 24*  CREATININE 3.24* 3.28* 3.16*  CALCIUM 8.6* 8.7* 9.2   Liver Function Tests: Recent Labs  Lab 09/10/20 0423 09/11/20 0500 09/12/20 0458 09/13/20 0330 09/14/20 0320  AST '22 19 18 17 15  '$ ALT '11 9 10 8 9  '$ ALKPHOS 38 39 44 42 41  BILITOT 1.0 0.9 0.9 0.8 0.8  PROT 7.1 7.1 7.6 7.6  7.5  ALBUMIN 2.4* 2.5* 2.5* 2.6* 2.5*      CBC: Recent Labs  Lab 09/08/20 0345 09/11/20 0500 09/12/20 0458 09/14/20 0320  WBC 4.5 3.9* 4.3 5.8  HGB 9.3* 9.5* 10.3* 10.3*  HCT 30.3* 31.0* 33.2* 33.3*  MCV 87.1 87.8 87.4 86.7  PLT 217 183 185 208    BNP (last 3 results) Recent Labs    07/25/20 0334 07/26/20 0331  BNP 153.5* 76.0     Continuous Infusions:  ceFEPime (MAXIPIME) IV 2 g (09/14/20 [id number])    Principal Problem:   Endocarditis Active Problems:   Opioid use disorder   Acute renal failure with oliguria (HCC)   Cerebral embolism with cerebral infarction   Anemia   Acute kidney injury (AKI) with acute tubular necrosis (ATN) (HCC)   Atrial fibrillation with RVR (HCC)   Typical atrial flutter (HCC)   Bilateral lower extremity edema   Status post mechanical aortic valve replacement   Abscess of aorta (HCC)   Fever, unspecified   Acute COVID-19   Consultants: Infectious disease PCCM Neurology CT surgery  Procedures: Echocardiogram TEE CRRT Cortrack  Antibiotics: Anti-infectives (From admission, onward)    Start     Dose/Rate Route Frequency Ordered Stop   09/07/20 1051  remdesivir 100 mg in sodium chloride 0.9 % 100 mL IVPB       See Hyperspace for full Linked Orders Report.   100 mg 200 mL/hr over 30 Minutes Intravenous Daily 09/07/20 1051 09/08/20 1115   09/07/20 1000  remdesivir 100 mg in sodium chloride 0.9 % 100 mL IVPB  Status:  Discontinued       See Hyperspace for full Linked Orders Report.   100 mg 200 mL/hr over 30 Minutes Intravenous Daily 09/06/20 1328 09/07/20 1051   09/06/20 1430  remdesivir 200 mg in sodium chloride 0.9% 250 mL IVPB       See Hyperspace for full Linked Orders Report.   200 mg 580 mL/hr over 30 Minutes Intravenous Once 09/06/20 1328 09/06/20 1918   09/04/20 2200  ceFEPIme (MAXIPIME) 2 g in sodium chloride 0.9 % 100 mL IVPB        2 g 200 mL/hr over 30 Minutes Intravenous Every 12 hours 09/04/20 0931 09/16/20  0659   09/03/20 1000  levofloxacin (LEVAQUIN) tablet 750 mg  Status:  Discontinued        750 mg Oral Every other day 09/02/20 1653 09/04/20 0931   09/02/20 1745  levofloxacin (LEVAQUIN) tablet 750 mg        750 mg Oral  Once 09/02/20 1653 09/02/20 1819   08/25/20 1800  ceFEPIme (MAXIPIME) 2 g in sodium chloride 0.9 % 100 mL IVPB  Status:  Discontinued        2 g 200 mL/hr over 30 Minutes Intravenous Every 12 hours 08/25/20 1618 09/02/20  1653   08/20/20 2200  ceFEPIme (MAXIPIME) 1 g in sodium chloride 0.9 % 100 mL IVPB  Status:  Discontinued        1 g 200 mL/hr over 30 Minutes Intravenous Every 24 hours 08/20/20 1523 08/25/20 1618   08/13/20 2200  ceFEPIme (MAXIPIME) 2 g in sodium chloride 0.9 % 100 mL IVPB  Status:  Discontinued        2 g 200 mL/hr over 30 Minutes Intravenous Every 24 hours 08/13/20 1154 08/20/20 1523   08/09/20 2200  ceFEPIme (MAXIPIME) 1 g in sodium chloride 0.9 % 100 mL IVPB  Status:  Discontinued        1 g 200 mL/hr over 30 Minutes Intravenous Every 24 hours 08/09/20 1238 08/13/20 1154   08/09/20 0000  ceFEPIme (MAXIPIME) 1 g in sodium chloride 0.9 % 100 mL IVPB  Status:  Discontinued        1 g 200 mL/hr over 30 Minutes Intravenous Every 24 hours 08/08/20 2305 08/09/20 1238        Time spent: 25 minutes    Erin Hearing ANP  [facility] 7 am - 330 pm/M-F for direct patient care and secure chat Please refer to Amion for contact info 37  days

## 2020-09-15 ENCOUNTER — Encounter: Payer: 59 | Admitting: Thoracic Surgery (Cardiothoracic Vascular Surgery)

## 2020-09-15 ENCOUNTER — Other Ambulatory Visit (HOSPITAL_COMMUNITY): Payer: Self-pay

## 2020-09-15 LAB — BASIC METABOLIC PANEL
Anion gap: 10 (ref 5–15)
BUN: 23 mg/dL — ABNORMAL HIGH (ref 6–20)
CO2: 24 mmol/L (ref 22–32)
Calcium: 9.3 mg/dL (ref 8.9–10.3)
Chloride: 102 mmol/L (ref 98–111)
Creatinine, Ser: 2.87 mg/dL — ABNORMAL HIGH (ref 0.61–1.24)
GFR, Estimated: 28 mL/min — ABNORMAL LOW (ref >60)
Glucose, Bld: 78 mg/dL (ref 70–99)
Potassium: 3.4 mmol/L — ABNORMAL LOW (ref 3.5–5.1)
Sodium: 136 mmol/L (ref 135–145)

## 2020-09-15 LAB — PROTIME-INR
INR: 2.7 — ABNORMAL HIGH (ref 0.8–1.2)
Prothrombin Time: 28.8 seconds — ABNORMAL HIGH (ref 11.4–15.2)

## 2020-09-15 MED ORDER — WARFARIN SODIUM 3 MG PO TABS
ORAL_TABLET | ORAL | 0 refills | Status: DC
  Filled 2020-09-15: qty 30, 30d supply, fill #0

## 2020-09-15 MED ORDER — WARFARIN SODIUM 7.5 MG PO TABS
7.5000 mg | ORAL_TABLET | Freq: Once | ORAL | Status: AC
  Administered 2020-09-15: 7.5 mg via ORAL
  Filled 2020-09-15: qty 1

## 2020-09-15 MED ORDER — WARFARIN SODIUM 5 MG PO TABS
6.0000 mg | ORAL_TABLET | Freq: Every day | ORAL | Status: DC
  Administered 2020-09-15: 6 mg via ORAL
  Filled 2020-09-15: qty 1

## 2020-09-15 NOTE — Plan of Care (Signed)
  Problem: Fluid Volume: Goal: Compliance with measures to maintain balanced fluid volume will improve Outcome: Adequate for Discharge

## 2020-09-15 NOTE — TOC Transition Note (Signed)
Transition of Care [facility]) - CM/SW Discharge Note   Patient Details  Name: ELIZAR GLEISSNER MRN: [id number] Date of Birth: 1980-06-11  Transition of Care [facility]) CM/SW Contact:  Curlene Labrum, RN Phone Number: 09/15/2020, 11:29 AM   Clinical Narrative:    Case management spoke with the patient on the phone in regards to transitions to home this evening after his last IV antibiotic dose.  The patient has remained hospitalized due to substance abuse history and need for IV antibiotics and recent COVID diagnosis.  The patient received delivery of TOC medications prior to discharge per [facility] pharmacist.  The patient plans to have his mother transport him home by car this evening.  Discharge to home is pending home with family this evening with no other TOC needs at this time.   Final next level of care: Home/Self Care Barriers to Discharge: No Barriers Identified   Patient Goals and CMS Choice Patient states their goals for this hospitalization and ongoing recovery are:: Patient plans to discharge home with his mother. CMS Medicare.gov Compare Post Acute Care list provided to:: Patient Choice offered to / list presented to : Patient  Discharge Placement                       Discharge Plan and Services   Discharge Planning Services: CM Consult Post Acute Care Choice: Long Term Acute Care (LTAC)                               Social Determinants of Health (SDOH) Interventions     Readmission Risk Interventions Readmission Risk Prevention Plan 08/25/2020  Medication Review (RN Care Manager) Complete  SW Recovery Care/Counseling Consult Complete  Skilled Nursing Facility Complete  Some recent data might be hidden

## 2020-09-15 NOTE — Discharge Summary (Signed)
Physician Discharge Summary  Alex [name] [id number] DOB: [birth year] DOA: 08/08/2020  PCP: Alex [name], [name] date: 08/08/2020 Discharge date: 09/15/2020  Time spent: 35 minutes  Recommendations for Outpatient Follow-up:  Pharmacist recommended to follow-up at cardiology in [place]/Coumadin clinic this Monday 6/27 for INR check.  Unfortunately no Monday appointments available and patient has appointment in place in [name] on Tuesday at same time.  Appointment was made for Wednesday at 10 AM.  Today's INR was 2.7 with goal 2.5-3.5.  Due to mildly supratherapeutic INR warfarin had been held for several days.  On date of discharge he will be given a dose of 7.5 mg with plans to take 6 mg daily. Patient needs to follow-up with his PCP (who is also his Suboxone provider) within 1 week of discharge He will need to follow-up with Dr. Kipp [name] cardiothoracic surgeon after discharge.  Ambulatory referral has been sent in office will contact patient with follow-up appointment. He will also follow-up with Dr. Montel [name] with ID on 6/28.  We will need to contact their office since appointment time was not provided when appointment date documented. In addition to needing warfarin follow-up he also needs to follow with a cardiologist regarding his history of transient atrial fibrillation and valve replacement. Patient was found to be COVID positive late during his hospitalization.  He was mildly symptomatic did not require oxygen.  His quarantine ends on 6/25   Discharge Diagnoses:  Principal Problem:   Endocarditis Active Problems:   Opioid use disorder   Acute renal failure with oliguria (HCC)   Cerebral embolism with cerebral infarction   Anemia   Acute kidney injury (AKI) with acute tubular necrosis (ATN) (HCC)   Atrial fibrillation with RVR (HCC)   Typical atrial flutter (HCC)   Bilateral lower extremity edema   Status post mechanical aortic valve  replacement   Abscess of aorta (HCC)   Fever, unspecified   Acute COVID-19    Discharge Condition: Stable  Diet recommendation: Heart healthy cardiac appropriate  Filed Weights   09/12/20 0527 09/13/20 0359 09/14/20 0317  Weight: 111.6 kg 110.9 kg 108 kg    History of present illness:  40 year old male with history of polysubstance abuse, aortic valve endocarditis, Serratia bacteremia, septic emboli causing stroke and acute kidney injury requiring hemodialysis.  Patient left hospital AMA on 08/08/2020.  He came back same night after discussion with CT surgery.   Since admission patient has improved significantly.  Renal function has returned to quint discontinuation of dialysis and hemodialysis catheter by the nephrology team.  His last set of blood cultures were negative on 5/7.  Because of poor venous access and midline catheter has been placed. 6/15 patient was found to have fevers and sore throat and has tested positive for COVID.  He is stable from a respiratory standpoint and has mild elevation of inflammatory markers.  He was given remdesivir x3 days only.  [name] will be completed on 6/25  Significant events:  4/26 transfer from [facility] to [facility] CT head 4/26 >> no acute findings CT abd/pelvis 4/26 >> cholelithiasis w/o inflammatory chagnes, mild hepatomegaly (25 cm), moderate splenomegaly (16.7 cm), 2 mm non obstructing kidney stone on Lt 4/27 CVL placed. Pressors escalating. 4/28 intubated for TEE, CVC, HD, and A-line placed. CRRT started Remains on CRRT MRI on 4/30-multiple infarcts 5/4: repeat TEE shows an aortic valve abscess 5/5: extubated , self-removed HD cath so CRRT holiday> developed severe hyperkalemia overnight. 5/6: new temp HD cath placed 5/8:  new temp HD cath placed after prior one self-removed by patient 5/9: Orthopantogram neg for abscess 5/10: HD cath replaced by IR [facility] 5/12 to OR for AVR, aortic root reconstruction, closure of aortic sinus track. To 2H  ICU on vent post op.  4 units PRBC given intraop. 5/13 extubated, on CRRT, remains on pressors 5/18, left AMA. 5/30 continues on intermittent hemodialysis as directed by nephrology team.  Continues with excellent urinary output.  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   -Presented with sepsis physiology required short-term mechanical ventilation   -MRI brain (07/22/2020): multiple acute infarcts.   -Blood cultures: Serratia marcescens -TEE: severe AI and AR with AV vegetation    -S/p mechanical aortic valve replacement, aortic root construction and closure of aortic sinus tract.   -Needs 6 weeks of cefepime per ID with end date 09/15/20 at 1700  -Coumadin per pharmacy.  Ambulatory referral sent to cardiology office in [place] regarding need for Coumadin clinic after discharge.  Current INR has been somewhat supratherapeutic and recent doses have been held.  Patient will likely need to follow-up soon after discharge either this Monday or Tuesday. -Briefly required Lovenox bridge in context of hypercoagulable state from COVID-INR now trending upward-pharmacy managing dosing -Follow-up with Dr. Kipp [name] after discharge for routine postoperative visit -Has follow-up ID appointment in place with Dr. Montel [name] on 6/28   Acute COVID-19 infection -Patient has tested positive for COVID-19 on 6/15-anticipate 10-day quarantine with last date 6/25 with time to discontinue 9:30 AM -O2 sats 96%; TM 99.9 -Mild COVID without hypoxemia -remdesivir for 3 days- Follow LFTs -CRP trend: 3.4 > 4.2>4.4 > 3.0 > 1.7 > 1.5 > 1.0 > 2.8 -LDH WNLs -D dimer: 2.9>2.57 > 2.47 > 2.29 > 2.29 > 2.3 > 2.05 > 1.80   Status post recent mechanical aortic valve replacement -Continue warfarin-pharmacy assisting with doses and recently  due to supratherapeutic INR doses have been held. -Coumadin to be resumed on 6/24 with 7.5 mg just prior to discharge and will discharge home on a 6 mg dose daily.  Patient has been instructed that future warfarin dosing will be based on lab work and physician recommendation.  He is aware that beginning tomorrow he should take 2 to 3 mg tablets.   Recurrent Anemia/thrombocytopenia -patient is s/p 3 units PRBC during this hospital stay.   -Hemoglobin 10.3 on 6/23 -Continue oral iron after discharge -Recommend follow-up anemia panel 6 to 8 weeks after discharge   Atrial fibrillation/flutter with RVR -Evaluated by cardiology -Maintaining sinus rhythm on CCB and BB -Continue warfarin; ambulatory referral sent to cardiology in [place] for Coumadin clinic   Acute kidney injury -likely from ATN 2/2 acute infection and sepsis physiology -Last HD 5/30 -Creatinine stable and down to 2.87 on 6/24   Polysubstance abuse -patient has h/o amphetamine abuse, nicotine dependence, opiate dependence on agonist therapy.   -He was on chronic Suboxone therapy at a clinic in [facility] prior to admission. -He told this Probation officer that he will no longer utilize drugs and that most recent circumstances of this admission has convinced him that the risks are too great to continue using IV drugs   Bilateral lower extremity edema -Resolved after several doses of oral Lasix   Obesity BMI-34.14kg/m    Procedures: Echocardiogram TEE CRRT [place] Removal of tunneled dialysis catheter  Consultations: Infectious  disease PCCM Neurology CT surgery    Discharge Exam: Vitals:   09/15/20 0832 09/15/20 1207  BP: 121/84 121/78  Pulse: 67 65  Resp: 17 18  Temp: 97.9 F (36.6 C) 97.7 F (36.5 C)  SpO2: 98% 97%   Constitutional: Calm, no acute distress Respiratory: clear and stable on room air. Cardiac: S1-S2, no tachycardia.  Normotensive.  Previous peripheral edema has completely  resolved Abdomen:  LBM 6/22, soft with normoactive bowel sounds and eating well. Neurologic: CN 2-12 grossly intact. Sensation intact, DTR normal. Strength 5/5 x all 4 extremities. Psychiatric: Alert and oriented x3.  Pleasant affect.   Discharge Instructions   Discharge Instructions     Ambulatory referral to Cardiology   Complete by: As directed    Coumadin clinic for AF/mech valve-discharge date 6/24-INR has been erratic-dc'd on 6 mg daily- HE NEEDS TO BE SEEN THIS Monday at the Coumadin Clinic-he is off [place] quarantine 6/25   Ambulatory referral to Cardiothoracic Surgery   Complete by: As directed    Post hospital follow-up after valve replacement surgery/aortic root surgery for abscess.  Patient will discharge on 6/24   Call MD for:  difficulty breathing, headache or visual disturbances   Complete by: As directed    Call MD for:  extreme fatigue   Complete by: As directed    Call MD for:  persistant dizziness or light-headedness   Complete by: As directed    Call MD for:  persistant nausea and vomiting   Complete by: As directed    Call MD for:  temperature >100.4   Complete by: As directed    Diet - low sodium heart healthy   Complete by: As directed    Discharge instructions   Complete by: As directed    Please keep all follow-up appointments as scheduled.  An appointment date was documented for your follow-up with the ID clinic but at time was not listed.  Please call the clinic to confirm time of appointment.  You will be given a dose of warfarin tonight before you leave.  Your next dose will be due tomorrow evening.  You will need to take to 3 mg tablets to equal 6 mg.  You can remain out of work until after you follow-up with your primary care doctor.  We will make the final determination about when it is safe to return to work and we will give you a release form.  A letter will be dictated for you to take home to give to your employer stating such.  You have a  follow-up appointment scheduled for the Coumadin clinic in [place] at the cardiology office next Wednesday.  If you notice any unusual bruising or bleeding please call their office as soon as possible.  They likely can wear nightly as an emergency to quickly check your INR level.   Increase activity slowly   Complete by: As directed    No wound care   Complete by: As directed       Allergies as of 09/15/2020   No Known Allergies      Medication List     STOP taking these medications    lisinopril-hydrochlorothiazide 10-12.5 MG tablet Commonly known as: ZESTORETIC       TAKE these medications    acetaminophen 325 MG tablet Commonly known as: TYLENOL Take 2 tablets (650 mg total) by mouth every 6 (six) hours as needed for mild pain (or Fever >/= 101).   buprenorphine-naloxone 8-2 mg Subl SL tablet  Commonly known as: SUBOXONE Place 1 tablet under the tongue See admin instructions. Place one tablet under the tongue twice daily. May place additional one-half tablet under tongue as needed for symptoms of withdrawal.   diltiazem 180 MG 24 hr capsule Commonly known as: CARDIZEM CD Take 1 capsule (180 mg total) by mouth daily.   lidocaine 5 % Commonly known as: LIDODERM Place 1 patch onto the skin daily. Remove & Discard patch within 12 hours or as directed by MD   melatonin 3 MG Tabs tablet Take 1 tablet (3 mg total) by mouth at bedtime.   methocarbamol 500 MG tablet Commonly known as: ROBAXIN Take 1 tablet (500 mg total) by mouth 3 (three) times daily.   multivitamin with minerals Tabs tablet Take 1 tablet by mouth daily.   Poly-Iron 150 Forte 150-0.025-1 MG Caps Generic drug: Iron Polysacch Cmplx-B12-FA Take 1 capsule by mouth daily.   Senexon-S 8.6-50 MG tablet Generic drug: senna-docusate Take 1 tablet by mouth 2 (two) times daily.   warfarin 3 MG tablet Commonly known as: Coumadin Take as directed by MD each night at supper. Dose will be determined by  labs       No Known Allergies  Follow-up Information     [facility]. Schedule an appointment as soon as possible for a visit.   Why: Please follow up with your primary care provider within 7-10 days of your discharge from the hospital or skilled nursing facility. Contact information: [address] [name] [address] [phone]         Lajuana Matte, MD. Call in 2 week(s).   Specialty: Cardiothoracic Surgery Why: Call to arrange post surgery visit after discharge Contact information: [place] [address] [phone]         Rosiland Oz, MD Follow up on 09/19/2020.   Specialty: Infectious Diseases Why: Reported as having an appointment on 6/28 but no time provided. Contact information: [place] [address] [place] Follow up on 09/20/2020.   Specialty: Cardiology Why: You have a new patient appointment at 10 AM on Wednesday.  This is with the Coumadin clinic to have your INR checked.  Please notify this office if you notice excessive bruising or new bleeding event you can come in earlier for an emergency INR check. Contact information: [place] [phone]                 The results of significant diagnostics from this hospitalization (including imaging, microbiology, ancillary and laboratory) are listed below for reference.    Significant Diagnostic Studies: IR Removal Tun Cv Cath W/O FL  Result Date: 08/29/2020 INDICATION: Patient with history of acute kidney injury with need for dialysis. No longer needed. Request is for tunneled hemodialysis catheter removal. EXAM: REMOVAL TUNNELED CENTRAL VENOUS CATHETER MEDICATIONS: No antibiotic was indicated for this procedure. ANESTHESIA/SEDATION: Moderate (conscious) sedation was not employed during this procedure.  FLUOROSCOPY TIME:  Fluoroscopy Time: none COMPLICATIONS: None immediate. PROCEDURE: Informed written consent was obtained from the patient after a thorough discussion of the procedural risks, benefits and alternatives. All questions were addressed. Maximal Sterile Barrier Technique was utilized including caps, mask, sterile gowns, sterile gloves, sterile drape, hand hygiene and skin antiseptic. A timeout was performed prior to the initiation of the procedure. The patient's right chest and catheter was  prepped and draped in a normal sterile fashion. Heparin was removed from both ports of catheter. 1% lidocaine was used for local anesthesia. Using gentle blunt dissection the cuff of the catheter was exposed and the catheter was removed in it's entirety. Pressure was held till hemostasis was obtained. A sterile dressing was applied. The patient tolerated the procedure well with no immediate complications. IMPRESSION: Successful catheter removal as described above. Read by: Rushie Nyhan, NP Electronically Signed   By: Lucrezia Europe M.D.   On: 08/29/2020 15:21   DG Foot Complete Right  Result Date: 08/28/2020 CLINICAL DATA:  Lower extremity edema. EXAM: RIGHT FOOT COMPLETE - 3+ VIEW COMPARISON:  None. FINDINGS: There is no evidence of fracture or dislocation. There is no evidence of arthropathy or other focal bone abnormality. Soft tissues are unremarkable. IMPRESSION: Negative. Electronically Signed   By: Marijo Conception M.D.   On: 08/28/2020 16:28   VAS [place] LOWER EXTREMITY VENOUS (DVT)  Result Date: 08/31/2020  Lower Venous DVT Study Patient Name:  XAYLEN BREDEHOFT  Date of Exam:   08/31/2020 Medical Rec #: [id number]          Accession #:    [id number] Date of Birth: 1980-05-28          Patient Gender: M Patient Age:   13Y Exam Location:  [facility] Procedure:      VAS [place] LOWER EXTREMITY VENOUS (DVT) Referring Phys: [id number] [place] Giammona L Kindrick [place]  --------------------------------------------------------------------------------  Indications: Edema.  Risk Factors: None identified. Limitations: Body habitus and poor ultrasound/tissue interface. Comparison Study: No prior studies. Performing Technologist: Oliver Hum RVT  Examination Guidelines: A complete evaluation includes B-mode imaging, spectral Doppler, color Doppler, and power Doppler as needed of all accessible portions of each vessel. Bilateral testing is considered an integral part of a complete examination. Limited examinations for reoccurring indications may be performed as noted. The reflux portion of the exam is performed with the patient in reverse Trendelenburg.  +---------+---------------+---------+-----------+----------+-------------------+ RIGHT    CompressibilityPhasicitySpontaneityPropertiesThrombus Aging      +---------+---------------+---------+-----------+----------+-------------------+ CFV      Full           Yes      Yes                                      +---------+---------------+---------+-----------+----------+-------------------+ SFJ      Full                                                             +---------+---------------+---------+-----------+----------+-------------------+ FV Prox  Full                                                             +---------+---------------+---------+-----------+----------+-------------------+ FV Mid                  Yes      Yes                                      +---------+---------------+---------+-----------+----------+-------------------+  FV Distal               Yes      Yes                                      +---------+---------------+---------+-----------+----------+-------------------+ PFV      Full                                                             +---------+---------------+---------+-----------+----------+-------------------+ POP      Full           Yes       Yes                                      +---------+---------------+---------+-----------+----------+-------------------+ PTV      Full                                                             +---------+---------------+---------+-----------+----------+-------------------+ PERO                                                  Not well visualized +---------+---------------+---------+-----------+----------+-------------------+   +---------+---------------+---------+-----------+----------+-------------------+ LEFT     CompressibilityPhasicitySpontaneityPropertiesThrombus Aging      +---------+---------------+---------+-----------+----------+-------------------+ CFV      Full           Yes      Yes                                      +---------+---------------+---------+-----------+----------+-------------------+ SFJ      Full                                                             +---------+---------------+---------+-----------+----------+-------------------+ FV Prox  Full                                                             +---------+---------------+---------+-----------+----------+-------------------+ FV Mid   Full                                                             +---------+---------------+---------+-----------+----------+-------------------+ FV Distal  Yes      Yes                                      +---------+---------------+---------+-----------+----------+-------------------+ PFV      Full                                                             +---------+---------------+---------+-----------+----------+-------------------+ POP      Full           Yes      Yes                                      +---------+---------------+---------+-----------+----------+-------------------+ PTV      Full                                                              +---------+---------------+---------+-----------+----------+-------------------+ PERO                                                  Not well visualized +---------+---------------+---------+-----------+----------+-------------------+     Summary: RIGHT: - There is no evidence of deep vein thrombosis in the lower extremity. However, portions of this examination were limited- see technologist comments above.  - No cystic structure found in the popliteal fossa.  LEFT: - There is no evidence of deep vein thrombosis in the lower extremity. However, portions of this examination were limited- see technologist comments above.  - No cystic structure found in the popliteal fossa.  *See table(s) above for measurements and observations. Electronically signed by Monica Martinez MD on 08/31/2020 at 7:44:35 PM.    Final     Microbiology: Recent Results (from the past 240 hour(s))  Culture, blood (routine x 2)     Status: None   Collection Time: 09/06/20 11:03 AM   Specimen: BLOOD RIGHT HAND  Result Value Ref Range Status   Specimen Description BLOOD RIGHT HAND  Final   Special Requests   Final    BOTTLES DRAWN AEROBIC AND ANAEROBIC Blood Culture adequate volume   Culture   Final    NO GROWTH 5 DAYS Performed at [facility], [address] [place], [place] [address]    Report Status 09/11/2020 FINAL  Final  Culture, blood (routine x 2)     Status: None   Collection Time: 09/06/20 11:03 AM   Specimen: BLOOD LEFT HAND  Result Value Ref Range Status   Specimen Description BLOOD LEFT HAND  Final   Special Requests   Final    BOTTLES DRAWN AEROBIC AND ANAEROBIC Blood Culture adequate volume   Culture   Final    NO GROWTH 5 DAYS Performed at [facility], [place] [address] [place], [name] [address]    Report Status 09/11/2020 FINAL  Final  Resp Panel  by RT-PCR (Flu A&B, Covid) Nasopharyngeal Swab     Status: Abnormal   Collection Time: 09/06/20 11:43 AM   Specimen: Nasopharyngeal Swab;  Nasopharyngeal(NP) swabs in vial transport medium  Result Value Ref Range Status   SARS Coronavirus 2 by RT PCR POSITIVE (A) NEGATIVE Final    Comment: RESULT CALLED TO, READ BACK BY AND VERIFIED WITH: Kathaleen Grinder RN [id number] 09/06/20 A BROWNING (NOTE) SARS-CoV-2 target nucleic acids are DETECTED.  The SARS-CoV-2 RNA is generally detectable in upper respiratory specimens during the acute phase of infection. Positive results are indicative of the presence of the identified virus, but do not rule out bacterial infection or co-infection with other pathogens not detected by the test. Clinical correlation with patient history and other diagnostic information is necessary to determine patient infection status. The expected result is Negative.  Fact Sheet for Patients: EntrepreneurPulse.com.au  Fact Sheet for Healthcare Providers: IncredibleEmployment.be  This test is not yet approved or cleared by the [place] FDA and  has been authorized for detection and/or diagnosis of SARS-CoV-2 by FDA under an Emergency Use Authorization (EUA).  This EUA will remain in effect (meaning this test can  be used) for the duration of  the COVID-19 declaration under Section 564(b)(1) of the Act, 21 U.S.C. section 360bbb-3(b)(1), unless the authorization is terminated or revoked sooner.     Influenza A by PCR NEGATIVE NEGATIVE Final   Influenza B by PCR NEGATIVE NEGATIVE Final    Comment: (NOTE) The Xpert Xpress SARS-CoV-2/FLU/RSV plus assay is intended as an aid in the diagnosis of influenza from Nasopharyngeal swab specimens and should not be used as a sole basis for treatment. Nasal washings and aspirates are unacceptable for Xpert Xpress SARS-CoV-2/FLU/RSV testing.  Fact Sheet for Patients: EntrepreneurPulse.com.au  Fact Sheet for Healthcare Providers: IncredibleEmployment.be  This test is not yet approved or cleared by the  [place] FDA and has been authorized for detection and/or diagnosis of SARS-CoV-2 by FDA under an Emergency Use Authorization (EUA). This EUA will remain in effect (meaning this test can be used) for the duration of the COVID-19 declaration under Section 564(b)(1) of the Act, 21 U.S.C. section 360bbb-3(b)(1), unless the authorization is terminated or revoked.  Performed at [facility], [place] [address] [place],  [address]      Labs: Basic Metabolic Panel: Recent Labs  Lab 09/09/20 0500 09/10/20 0423 09/12/20 0458 09/15/20 0350  NA 140 139 140 136  K 3.3* 3.5 3.4* 3.4*  CL 102 103 107 102  CO2 '27 25 24 24  '$ GLUCOSE 76 76 83 78  BUN 31* 29* 24* 23*  CREATININE 3.24* 3.28* 3.16* 2.87*  CALCIUM 8.6* 8.7* 9.2 9.3   Liver Function Tests: Recent Labs  Lab 09/10/20 0423 09/11/20 0500 09/12/20 0458 09/13/20 0330 09/14/20 0320  AST '22 19 18 17 15  '$ ALT '11 9 10 8 9  '$ ALKPHOS 38 39 44 42 41  BILITOT 1.0 0.9 0.9 0.8 0.8  PROT 7.1 7.1 7.6 7.6 7.5  ALBUMIN 2.4* 2.5* 2.5* 2.6* 2.5*   No results for input(s): LIPASE, AMYLASE in the last 168 hours. No results for input(s): AMMONIA in the last 168 hours. CBC: Recent Labs  Lab 09/11/20 0500 09/12/20 0458 09/14/20 0320  WBC 3.9* 4.3 5.8  HGB 9.5* 10.3* 10.3*  HCT 31.0* 33.2* 33.3*  MCV 87.8 87.4 86.7  PLT 183 185 208   Cardiac Enzymes: Recent Labs  Lab 09/10/20 0423 09/11/20 0500 09/12/20 0458 09/13/20 0330 09/14/20 0320  CKTOTAL 9* 9* 11* 11* 10*   BNP: BNP (last 3 results) Recent Labs    07/25/20 0334 07/26/20 0331  BNP 153.5* 76.0    ProBNP (last 3 results) No results for input(s): PROBNP in the last 8760 hours.  CBG: No results for input(s): GLUCAP in the last 168 hours.     Signed:  Erin Hearing ANP [facility] 09/15/2020, 2:11 PM

## 2020-09-15 NOTE — Progress Notes (Signed)
Pt discharged per MD order. Discharge instructions reviewed to satisfaction. Midline removed as well as tele. Pt escorted to personal vehicle.   Alex [name]

## 2020-09-15 NOTE — Progress Notes (Signed)
ANTICOAGULATION CONSULT NOTE - Follow Up Consult  Pharmacy Consult for Warfarin Indication:  mechanical aortic valve    No Known Allergies  Patient Measurements: Height: '6\' 4"'$  (193 cm) Weight: 108 kg (238 lb 1.6 oz) IBW/kg (Calculated) : 86.8  Vital Signs: Temp: 98.3 F (36.8 C) (06/24 0327) Temp Source: Oral (06/24 0327) BP: 117/71 (06/24 0327) Pulse Rate: 67 (06/24 0327)  Labs: Recent Labs    09/13/20 0330 09/14/20 0320 09/15/20 0350  HGB  --  10.3*  --   HCT  --  33.3*  --   PLT  --  208  --   LABPROT 43.3* 44.8* 28.8*  INR 4.6* 4.8* 2.7*  CREATININE  --   --  2.87*  CKTOTAL 11* 10*  --     Estimated Creatinine Clearance: 47.5 mL/min (A) (by C-G formula based on SCr of 2.87 mg/dL (H)).  Assessment: 39yom with recent endocarditis> AV root repair and Mechanical AVR on 08/03/20. Warfarin started 5/13. Left AMA 5/16 pm then returned 5/17 pm. INR 2.6 on 5/18 and Warfarin resumed. Pharmacy was consulted 5/27 (INR 1.8) to bridge with heparin until INR >2.5 again. Heparin stopped 6/1 when INR 3.1.    Goal INR 2.5-3.5; s/p Lovenox bridge. CBCs every 72 hours, most recent Hgb 10.3 (low/stable), PLTs WNL. No bleeding reported.   INR down to 2.6 today after holding 2 doses. Plan to dc on 6/25. Will give a higher dose today and put on a maintenance dose for discharge.   Goal of Therapy:  INR 2.5-3.5  per Dr. Kipp Brood Monitor platelets by anticoagulation protocol: Yes    Plan:  Coumadin 7.'5mg'$  PO x1 then '6mg'$  PO qday Daily PT/INR CBC q72 hr Monitor for s/sx of bleeding  Onnie Boer, PharmD, BCIDP, AAHIVP, CPP Infectious Disease Pharmacist 09/15/2020 7:16 AM

## 2020-09-19 ENCOUNTER — Ambulatory Visit (INDEPENDENT_AMBULATORY_CARE_PROVIDER_SITE_OTHER): Payer: 59 | Admitting: Infectious Diseases

## 2020-09-19 ENCOUNTER — Encounter: Payer: Self-pay | Admitting: Infectious Diseases

## 2020-09-19 ENCOUNTER — Other Ambulatory Visit: Payer: Self-pay

## 2020-09-19 VITALS — BP 125/79 | HR 76 | Temp 97.8°F | Wt 251.0 lb

## 2020-09-19 DIAGNOSIS — F1190 Opioid use, unspecified, uncomplicated: Secondary | ICD-10-CM

## 2020-09-19 DIAGNOSIS — Z5181 Encounter for therapeutic drug level monitoring: Secondary | ICD-10-CM

## 2020-09-19 DIAGNOSIS — I358 Other nonrheumatic aortic valve disorders: Secondary | ICD-10-CM | POA: Diagnosis not present

## 2020-09-19 NOTE — Progress Notes (Signed)
[facility]                                                             [place], [place], [place], [address]                                                                  Phn. 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  Patient has completed 6 weeks of IV cefepime on 09/15/2020  History of IVDU-encouraged on staying away from drugs, HIV and HCV ab NR Atrial flutter-follow-up with cardiology Obesity  Plan Surveillance blood cultures today Follow-up with cardiology and cardiovascular surgery.  Will defer need of echocardiogram post completion of treatment to them Discussed with patient regardin signs and symptoms to seek immediate attention Follow-up with [place] as needed  All questions and concerns were discussed and addressed. Patient verbalized understanding of the plan. ____________________________________________________________________________________________________________________ Interval events 09/19/20 Here for HFU for AV endocarditis with AR abscess/perivalular abscess with CNS septic emboli. Underwent Aortic valve replacement with a 23 mm On-X valve/Aortic root reconstruction with bovine pericardium and Closure of aortic sinus track with bovine  pericardium on 08/03/20. AV tissue cx with no organisms in gram stain and no growth in cultures. Patient has completed 6 weeks of IV antibiotics on 09/15/20.  Patient was also tested positive for COVID on 6/15 and received 3 days of remdesivir. Doing well. No complaints. Denies fevers, chills and sweats. Denies nausea, vomiting and diarrhea. He has occasional chest pain when he wakes up in the morning but nothing concerning. Denies any cough, SOB and dizziness, He has a follow up with Dr Sunday Corn foot on 7/1 and also has a follow up with Cardiology for A. flutter.  He also developed AKI during hospitalization for which she was on renal replacement therapy initially.  Creatinine has stabilized/downtrending. He is following up with the coumadin clinic.   He says he has quit IVDU and is not interested to do anything that will make him go to the hospital.  He states he was using all  sorts of IV drugs prior to the hospitalization. he is living with his mother.   ROS: Constitutional: Negative for fever, chills, activity change, appetite change, fatigue and unexpected weight change.  Respiratory: Negative for cough, shortness of breath Cardiovascular: Negative forpalpitations and leg swelling.  Gastrointestinal: Negative for nausea, vomiting, abdominal pain, diarrhea/constipation, .  Genitourinary: Negative for dysuria, hematuria, flank pain Musculoskeletal: Negative for myalgias, arthralgia, back pain, joint swelling, arthralgias Skin: Negative for rashes, lesions  Neurological: Negative for weakness, dizziness or headache  Past Medical History:  Diagnosis Date   Ruptured lumbar disc     Past Surgical History:  Procedure Laterality Date   AORTIC/RENAL BYPASS     BENTALL PROCEDURE N/A 08/03/2020   Procedure: BENTALL PROCEDURE USING ON-X AORTIC VALVE SIZE 23 MM AND PERI-GUARD 4CM X 4CM PERICARDIUM;  Surgeon: Lajuana Matte, MD;  Location: [facility];  Service: Open Heart Surgery;  Laterality: N/A;   IR  FLUORO GUIDE CV LINE RIGHT  08/01/2020   IR REMOVAL TUN CV CATH W/O FL  08/29/2020   IR US GUIDE VASC ACCESS RIGHT  08/01/2020   TEE WITHOUT CARDIOVERSION N/A 08/03/2020   Procedure: TRANSESOPHAGEAL ECHOCARDIOGRAM (TEE);  Surgeon: Lajuana Matte, MD;  Location: [facility];  Service: Open Heart Surgery;  Laterality: N/A;   Current Outpatient Medications on File Prior to Visit  Medication Sig Dispense Refill   acetaminophen (TYLENOL) 325 MG tablet Take 2 tablets (650 mg total) by mouth every 6 (six) hours as needed for mild pain (or Fever >/= 101).     buprenorphine-naloxone (SUBOXONE) 8-2 mg SUBL SL tablet Place 1 tablet under the tongue See admin instructions. Place one tablet under the tongue twice daily. May place additional one-half tablet under tongue as needed for symptoms of withdrawal.     diltiazem (CARDIZEM CD) 180 MG 24 hr capsule Take 1 capsule (180 mg total) by mouth daily. 30 capsule 0   Iron Polysacch Cmplx-B12-FA 150-0.025-1 MG CAPS Take 1 capsule by mouth daily. 30 capsule 0   methocarbamol (ROBAXIN) 500 MG tablet Take 1 tablet (500 mg total) by mouth 3 (three) times daily. 30 tablet 0   senna-docusate (SENOKOT-S) 8.6-50 MG tablet Take 1 tablet by mouth 2 (two) times daily. 60 tablet 0   warfarin (COUMADIN) 3 MG tablet Take as directed by MD each night at supper. Dose will be determined by labs 60 tablet 0   lidocaine (LIDODERM) 5 % Place 1 patch onto the skin daily. Remove & Discard patch within 12 hours or as directed by MD 30 patch 0   melatonin 3 MG TABS tablet Take 1 tablet (3 mg total) by mouth at bedtime.  0   Multiple Vitamin (MULTIVITAMIN WITH MINERALS) TABS tablet Take 1 tablet by mouth daily.     No current facility-administered medications on file prior to visit.   No Known Allergies  Social History   Socioeconomic History   Marital status: Single    Spouse name: Not on file   Number of children: Not on file   Years of education: Not on file   Highest education  level: Not on file  Occupational History   Not on file  Tobacco Use   Smoking status: Every Day    Packs/day: 1.00    Pack years: 0.00    Types: Cigarettes   Smokeless tobacco: Never  Vaping Use   Vaping Use: Never used  Substance and Sexual Activity   Alcohol use: No   Drug use: Yes  Types: Marijuana    Comment: denies use 08/24/17   Sexual activity: Not on file  Other Topics Concern   Not on file  Social History Narrative   Not on file   Social Determinants of Health   Financial Resource Strain: Not on file  Food Insecurity: Not on file  Transportation Needs: Not on file  Physical Activity: Not on file  Stress: Not on file  Social Connections: Not on file  Intimate Partner Violence: Not on file     Vitals BP 125/79   Pulse 76   Temp 97.8 F (36.6 C) (Oral)   Wt 251 lb (113.9 kg)   BMI 30.55 kg/m    Examination  General - not in acute distress, comfortably sitting in chair HEENT - PEERLA, no pallor and no icterus Chest - b/l clear air entry, no additional sounds CVS- Normal s1s2, RRR Abdomen - Soft, Non tender , non distended Ext- no pedal edema Neuro: grossly normal Back - WNL Psych : calm and cooperative   Recent labs CBC Latest Ref Rng & Units 09/14/2020 09/12/2020 09/11/2020  WBC 4.0 - 10.5 K/uL 5.8 4.3 3.9(L)  Hemoglobin 13.0 - 17.0 g/dL 10.3(L) 10.3(L) 9.5(L)  Hematocrit 39.0 - 52.0 % 33.3(L) 33.2(L) 31.0(L)  Platelets 150 - 400 K/uL 208 185 183   CMP Latest Ref Rng & Units 09/15/2020 09/14/2020 09/13/2020  Glucose 70 - 99 mg/dL 78 - -  BUN 6 - 20 mg/dL 23(H) - -  Creatinine 0.61 - 1.24 mg/dL 2.87(H) - -  Sodium 135 - 145 mmol/L 136 - -  Potassium 3.5 - 5.1 mmol/L 3.4(L) - -  Chloride 98 - 111 mmol/L 102 - -  CO2 22 - 32 mmol/L 24 - -  Calcium 8.9 - 10.3 mg/dL 9.3 - -  Total Protein 6.5 - 8.1 g/dL - 7.5 7.6  Total Bilirubin 0.3 - 1.2 mg/dL - 0.8 0.8  Alkaline Phos 38 - 126 U/L - 41 42  AST 15 - 41 U/L - 15 17  ALT 0 - 44 U/L - 9 8      Pertinent Microbiology Results for orders placed or performed during the hospital encounter of 08/08/20  Resp Panel by RT-PCR (Flu A&B, Covid) Nasopharyngeal Swab     Status: None   Collection Time: 08/08/20  9:10 PM   Specimen: Nasopharyngeal Swab; Nasopharyngeal(NP) swabs in vial transport medium  Result Value Ref Range Status   SARS Coronavirus 2 by RT PCR NEGATIVE NEGATIVE Final    Comment: (NOTE) SARS-CoV-2 target nucleic acids are NOT DETECTED.  The SARS-CoV-2 RNA is generally detectable in upper respiratory specimens during the acute phase of infection. The lowest concentration of SARS-CoV-2 viral copies this assay can detect is 138 copies/mL. A negative result does not preclude SARS-Cov-2 infection and should not be used as the sole basis for treatment or other patient management decisions. A negative result may occur with  improper specimen collection/handling, submission of specimen other than nasopharyngeal swab, presence of viral mutation(s) within the areas targeted by this assay, and inadequate number of viral copies(<138 copies/mL). A negative result must be combined with clinical observations, patient history, and epidemiological information. The expected result is Negative.  Fact Sheet for Patients:  EntrepreneurPulse.com.au  Fact Sheet for Healthcare Providers:  IncredibleEmployment.be  This test is no t yet approved or cleared by the [place] FDA and  has been authorized for detection and/or diagnosis of SARS-CoV-2 by FDA under an Emergency Use Authorization (EUA). This EUA will remain  in effect (meaning this test can be used) for the duration of the COVID-19 declaration under Section 564(b)(1) of the Act, 21 U.S.C.section 360bbb-3(b)(1), unless the authorization is terminated  or revoked sooner.       Influenza A by PCR NEGATIVE NEGATIVE Final   Influenza B by PCR NEGATIVE NEGATIVE Final    Comment:  (NOTE) The Xpert Xpress SARS-CoV-2/FLU/RSV plus assay is intended as an aid in the diagnosis of influenza from Nasopharyngeal swab specimens and should not be used as a sole basis for treatment. Nasal washings and aspirates are unacceptable for Xpert Xpress SARS-CoV-2/FLU/RSV testing.  Fact Sheet for Patients: EntrepreneurPulse.com.au  Fact Sheet for Healthcare Providers: IncredibleEmployment.be  This test is not yet approved or cleared by the [place] FDA and has been authorized for detection and/or diagnosis of SARS-CoV-2 by FDA under an Emergency Use Authorization (EUA). This EUA will remain in effect (meaning this test can be used) for the duration of the COVID-19 declaration under Section 564(b)(1) of the Act, 21 U.S.C. section 360bbb-3(b)(1), unless the authorization is terminated or revoked.  Performed at [facility], [place] [address] [place], [place] [address]   Culture, blood (routine x 2)     Status: None   Collection Time: 09/06/20 11:03 AM   Specimen: BLOOD RIGHT HAND  Result Value Ref Range Status   Specimen Description BLOOD RIGHT HAND  Final   Special Requests   Final    BOTTLES DRAWN AEROBIC AND ANAEROBIC Blood Culture adequate volume   Culture   Final    NO GROWTH 5 DAYS Performed at [facility], [place] [address], [place], [place] [address]    Report Status 09/11/2020 FINAL  Final  Culture, blood (routine x 2)     Status: None   Collection Time: 09/06/20 11:03 AM   Specimen: BLOOD LEFT HAND  Result Value Ref Range Status   Specimen Description BLOOD LEFT HAND  Final   Special Requests   Final    BOTTLES DRAWN AEROBIC AND ANAEROBIC Blood Culture adequate volume   Culture   Final    NO GROWTH 5 DAYS Performed at [facility], [place] [address] [place], [place] [address]    Report Status 09/11/2020 FINAL  Final  Resp Panel by RT-PCR (Flu A&B, Covid) Nasopharyngeal Swab     Status: Abnormal    Collection Time: 09/06/20 11:43 AM   Specimen: Nasopharyngeal Swab; Nasopharyngeal(NP) swabs in vial transport medium  Result Value Ref Range Status   SARS Coronavirus 2 by RT PCR POSITIVE (A) NEGATIVE Final    Comment: RESULT CALLED TO, READ BACK BY AND VERIFIED WITH: Kathaleen Grinder RN [id number] 09/06/20 A BROWNING (NOTE) SARS-CoV-2 target nucleic acids are DETECTED.  The SARS-CoV-2 RNA is generally detectable in upper respiratory specimens during the acute phase of infection. Positive results are indicative of the presence of the identified virus, but do not rule out bacterial infection or co-infection with other pathogens not detected by the test. Clinical correlation with patient history and other diagnostic information is necessary to determine patient infection status. The expected result is Negative.  Fact Sheet for Patients: EntrepreneurPulse.com.au  Fact Sheet for Healthcare Providers: IncredibleEmployment.be  This test is not yet approved or cleared by the [place] FDA and  has been authorized for detection and/or diagnosis of SARS-CoV-2 by FDA under an Emergency Use Authorization (EUA).  This EUA will remain in effect (meaning this test can  be used) for the duration of  the COVID-19  declaration under Section 564(b)(1) of the Act, 21 U.S.C. section 360bbb-3(b)(1), unless the authorization is terminated or revoked sooner.     Influenza A by PCR NEGATIVE NEGATIVE Final   Influenza B by PCR NEGATIVE NEGATIVE Final    Comment: (NOTE) The Xpert Xpress SARS-CoV-2/FLU/RSV plus assay is intended as an aid in the diagnosis of influenza from Nasopharyngeal swab specimens and should not be used as a sole basis for treatment. Nasal washings and aspirates are unacceptable for Xpert Xpress SARS-CoV-2/FLU/RSV testing.  Fact Sheet for Patients: EntrepreneurPulse.com.au  Fact Sheet for Healthcare  Providers: IncredibleEmployment.be  This test is not yet approved or cleared by the [place] FDA and has been authorized for detection and/or diagnosis of SARS-CoV-2 by FDA under an Emergency Use Authorization (EUA). This EUA will remain in effect (meaning this test can be used) for the duration of the COVID-19 declaration under Section 564(b)(1) of the Act, 21 U.S.C. section 360bbb-3(b)(1), unless the authorization is terminated or revoked.  Performed at [facility], [place] [address] [place], [place] [address]     Pertinent Imaging TEE 07/20/20 1. Left ventricular ejection fraction, by estimation, is 60 to 65%. The left ventricle has normal function. The left ventricle has no regional wall motion abnormalities. 2. Right ventricular systolic function is normal. The right ventricular size is normal. 3. No left atrial/left atrial appendage thrombus was detected. 4. The mitral valve is normal in structure. No evidence of mitral valve regurgitation. 5. Perforated left coronary cusp with severe aortic regurgitation. Large 36 x 20m mobile vegetation on ventricular surface of left coronary cusp. The aortic valve is abnormal. Aortic valve regurgitation is severe. 6. Intubated, ETT in place. 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  TEE 07/27/20 1. 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 2. Type 1d Aortic Regurgitation: Perforated left coronary cusp with severe, eccentric aortic regurgitation. Large 36 x 14 mm mobile vegetation on ventricular surface of left coronary cusp. . The aortic valve is abnormal. Aortic valve regurgitation is severe. 3. Left ventricular ejection fraction, by estimation, is 60 to 65%. The left ventricle has normal function. The left ventricle has no regional wall motion abnormalities. 4. Right ventricular systolic function is normal.  The right ventricular size is normal. 5. No left atrial/left atrial appendage thrombus was detected. 6. The mitral valve is normal in structure. No evidence of mitral valve regurgitation.  TEE 08/03/20 POST-OP IMPRESSIONS  Left Ventricle: has normal systolic function, with an ejection fraction of 50%. The cavity size was normal. The wall motion is normal.  Right Ventricle: normal function. The cavity was normal. The wall motion is normal.  Aorta: there is no dissection present in the aorta A graft was placed patch graft under left main for repair.  Aortic Valve: A tilting disk mechanical valve was placed, leaflets are freely mobile. Normal washing jets for valve type. The gradient recorded across the prosthetic valve is within the expected range, measuring 11 cm/s.  Mitral Valve: The mitral valve appears unchanged from pre-bypass.  Tricuspid Valve: The tricuspid valve appears unchanged from pre-bypass.   All pertinent labs/Imagings/notes reviewed. All pertinent plain films and CT images have been personally visualized and interpreted; radiology reports have been reviewed. Decision making incorporated into the Impression / Recommendations.  have spent more than 35 minutes of face-to-face and non-face-to-face time, excluding clinical staff time, preparing to see patient, ordering tests and/or medications, and provide counseling the patient  Electronically signed by:  Rosiland Oz, MD Infectious Disease Physician [facility] [address] [place], [place] [address] Phone: [phone]  Fax: [phone]

## 2020-09-20 ENCOUNTER — Ambulatory Visit (INDEPENDENT_AMBULATORY_CARE_PROVIDER_SITE_OTHER): Payer: 59 | Admitting: *Deleted

## 2020-09-20 DIAGNOSIS — I4891 Unspecified atrial fibrillation: Secondary | ICD-10-CM | POA: Diagnosis not present

## 2020-09-20 DIAGNOSIS — I63413 Cerebral infarction due to embolism of bilateral middle cerebral arteries: Secondary | ICD-10-CM | POA: Diagnosis not present

## 2020-09-20 DIAGNOSIS — Z5181 Encounter for therapeutic drug level monitoring: Secondary | ICD-10-CM

## 2020-09-20 DIAGNOSIS — I6340 Cerebral infarction due to embolism of unspecified cerebral artery: Secondary | ICD-10-CM

## 2020-09-20 LAB — POCT INR: INR: 2.2 (ref 2.0–3.0)

## 2020-09-20 NOTE — Patient Instructions (Signed)
Using WARFARIN '3MG'$  TABLET Was D/C from hospital 6/24 on warfarin '6mg'$  daily ('3mg'$  tablet x 2) Increase warfarin to '6mg'$  daily except '9mg'$  on Wednesdays Recheck in 1 week

## 2020-09-22 ENCOUNTER — Encounter: Payer: Self-pay | Admitting: Thoracic Surgery (Cardiothoracic Vascular Surgery)

## 2020-09-22 ENCOUNTER — Other Ambulatory Visit: Payer: Self-pay

## 2020-09-22 ENCOUNTER — Other Ambulatory Visit: Payer: Self-pay | Admitting: *Deleted

## 2020-09-22 ENCOUNTER — Ambulatory Visit (INDEPENDENT_AMBULATORY_CARE_PROVIDER_SITE_OTHER): Payer: Self-pay | Admitting: Thoracic Surgery (Cardiothoracic Vascular Surgery)

## 2020-09-22 ENCOUNTER — Ambulatory Visit
Admission: RE | Admit: 2020-09-22 | Discharge: 2020-09-22 | Disposition: A | Discharge status: Home / Self Care | Payer: 59 | Source: Ambulatory Visit | Attending: Thoracic Surgery (Cardiothoracic Vascular Surgery) | Admitting: Thoracic Surgery (Cardiothoracic Vascular Surgery)

## 2020-09-22 VITALS — BP 138/92 | HR 80 | Resp 20 | Ht 76.0 in | Wt 252.0 lb

## 2020-09-22 DIAGNOSIS — Z952 Presence of prosthetic heart valve: Secondary | ICD-10-CM

## 2020-09-22 NOTE — Progress Notes (Signed)
      [address]       [place] [address]             [phone]        Alex [name] [place] Medical Record [id number] Date of Birth: 10/26/1980  Referring: Alex Pain, MD Primary Care: Alex [name] [facility] Primary Cardiologist:Alex Hochrein, MD  Reason for visit:   follow-up  History of Present Illness:     40 year old male status post aortic valve replacement for endocarditis presents for his 1 month follow-up appointment.  Overall he is doing well.  He does have some incisional [name].  He denies any shortness of breath.  Physical Exam: BP (!) 138/92 (BP Location: Right Arm, Patient Position: Sitting)   Pulse 80   Resp 20   Ht '6\' 4"'$  (1.93 m)   Wt 252 lb (114.3 kg)   SpO2 97% Comment: RA  BMI 30.67 kg/m   Alert NAD Incision clean, well-healed.  Sternum stable Abdomen soft, ND No peripheral edema   Diagnostic Studies & Laboratory data: CXR: Clear     Assessment / Plan:   40 year old male status post mechanical aortic valve replacement.  INRs have been therapeutic.  He is cleared for cardiac rehab and referrals been placed.  He will also follow-up with cardiology for ongoing management of his mechanical valve.   Alex [name] 09/22/2020 11:40 AM

## 2020-09-25 LAB — CULTURE, BLOOD (SINGLE)
MICRO NUMBER:: 12059272
Result:: NO GROWTH
SPECIMEN QUALITY:: ADEQUATE

## 2020-09-26 ENCOUNTER — Telehealth: Payer: Self-pay

## 2020-09-26 NOTE — Telephone Encounter (Signed)
-----   Message from Rosiland Oz, MD sent at 09/26/2020  2:26 PM EDT ----- Please let patient know that his blood cultures are negative

## 2020-09-26 NOTE — Progress Notes (Signed)
Please let patient know that his blood cultures are negative

## 2020-09-26 NOTE — Telephone Encounter (Signed)
Patient made aware via secure voicemail of lab results, instructed to call the office with any questions.  [place]

## 2020-09-27 ENCOUNTER — Ambulatory Visit (INDEPENDENT_AMBULATORY_CARE_PROVIDER_SITE_OTHER): Payer: 59 | Admitting: *Deleted

## 2020-09-27 ENCOUNTER — Other Ambulatory Visit: Payer: Self-pay

## 2020-09-27 DIAGNOSIS — Z952 Presence of prosthetic heart valve: Secondary | ICD-10-CM

## 2020-09-27 DIAGNOSIS — I4891 Unspecified atrial fibrillation: Secondary | ICD-10-CM

## 2020-09-27 DIAGNOSIS — I63413 Cerebral infarction due to embolism of bilateral middle cerebral arteries: Secondary | ICD-10-CM

## 2020-09-27 DIAGNOSIS — Z5181 Encounter for therapeutic drug level monitoring: Secondary | ICD-10-CM | POA: Diagnosis not present

## 2020-09-27 LAB — POCT INR: INR: 2.5 (ref 2.0–3.0)

## 2020-09-27 NOTE — Patient Instructions (Signed)
Change to WARFARIN '5MG'$  TABLET Increase dose to 1 1/2 tablets daily except 1 tablet on Tuesdays and Saturdays Recheck in 1 week

## 2020-10-09 ENCOUNTER — Ambulatory Visit (INDEPENDENT_AMBULATORY_CARE_PROVIDER_SITE_OTHER): Payer: 59 | Admitting: *Deleted

## 2020-10-09 ENCOUNTER — Other Ambulatory Visit: Payer: Self-pay

## 2020-10-09 DIAGNOSIS — I4891 Unspecified atrial fibrillation: Secondary | ICD-10-CM

## 2020-10-09 DIAGNOSIS — Z5181 Encounter for therapeutic drug level monitoring: Secondary | ICD-10-CM | POA: Diagnosis not present

## 2020-10-09 DIAGNOSIS — I63413 Cerebral infarction due to embolism of bilateral middle cerebral arteries: Secondary | ICD-10-CM

## 2020-10-09 LAB — POCT INR: INR: 4.3 — AB (ref 2.0–3.0)

## 2020-10-09 MED ORDER — WARFARIN SODIUM 5 MG PO TABS: 5.0000 mg | ORAL_TABLET | Freq: Every day | ORAL | 3 refills | Status: DC

## 2020-10-09 NOTE — Patient Instructions (Signed)
Change to WARFARIN '5MG'$  TABLET Hold warfarin today the  decrease dose to 1 1/2 tablets daily except 1 tablet on Tuesdays, Thursdays and Saturdays Recheck in 1 week

## 2020-10-16 ENCOUNTER — Other Ambulatory Visit: Payer: Self-pay

## 2020-10-16 ENCOUNTER — Ambulatory Visit (INDEPENDENT_AMBULATORY_CARE_PROVIDER_SITE_OTHER): Payer: 59 | Admitting: *Deleted

## 2020-10-16 DIAGNOSIS — Z5181 Encounter for therapeutic drug level monitoring: Secondary | ICD-10-CM | POA: Diagnosis not present

## 2020-10-16 DIAGNOSIS — I4891 Unspecified atrial fibrillation: Secondary | ICD-10-CM | POA: Diagnosis not present

## 2020-10-16 DIAGNOSIS — I63413 Cerebral infarction due to embolism of bilateral middle cerebral arteries: Secondary | ICD-10-CM

## 2020-10-16 LAB — POCT INR: INR: 1.8 — AB (ref 2.0–3.0)

## 2020-10-16 NOTE — Patient Instructions (Signed)
Change to WARFARIN '5MG'$  TABLET Take warfarin 2 tablets tonight then increase dose to 1 1/2 tablets daily except 1 tablet on Tuesdays and Saturdays Recheck in 2 week

## 2020-10-20 ENCOUNTER — Ambulatory Visit: Payer: 59 | Admitting: Family Medicine

## 2020-10-30 ENCOUNTER — Ambulatory Visit (INDEPENDENT_AMBULATORY_CARE_PROVIDER_SITE_OTHER): Payer: 59 | Admitting: *Deleted

## 2020-10-30 DIAGNOSIS — I4891 Unspecified atrial fibrillation: Secondary | ICD-10-CM | POA: Diagnosis not present

## 2020-10-30 DIAGNOSIS — I63413 Cerebral infarction due to embolism of bilateral middle cerebral arteries: Secondary | ICD-10-CM

## 2020-10-30 DIAGNOSIS — Z5181 Encounter for therapeutic drug level monitoring: Secondary | ICD-10-CM | POA: Diagnosis not present

## 2020-10-30 LAB — POCT INR: INR: 2 (ref 2.0–3.0)

## 2020-10-30 NOTE — Patient Instructions (Signed)
Change to WARFARIN '5MG'$  TABLET Take warfarin 2 tablets tonight then increase dose to 1 1/2 tablets daily except 1 tablet on Saturdays Recheck in 2 weeks

## 2020-11-12 NOTE — Progress Notes (Signed)
Cardiology Office Note  Date: 11/13/2020   ID: Alex [name], DOB November 21, 1980, MRN [id number]  PCP:  Curly Rim, MD  Cardiologist:  Minus Breeding, MD Electrophysiologist:  None   Chief Complaint: Hospital follow up.  History of Present Illness: Alex [name] is a 40 y.o. male with a history of endocarditis, opioid use disorder, acute renal failure with oliguria, cerrebral embolism with cerebral infarction, anemia, atrial fibrillation with RVR, typical atrial flutter, bilateral lower extremity edema, status post mechanical aortic valve replacement, abscess of aorta, Acute COVID-19.  He presented to [facility] emergency room with history of heroin and methamphetamine abuse.  Currently on Suboxone.  He presented with altered mental status.  He had been more somnolent than usual.  He had been reportedly sick for the prior week with fever.  He was admitted with sepsis with encephalopathy and septic shock due to unspecified organism, amphetamine abuse, elevated LFTs, acute renal failure with oliguria, thrombocytopenia.He was admitted with aortic valve endocarditis and severe sepsis.  He developed acute renal injury requiring CRRT.  Once he was extubated he was noted to be in bigeminy and a repeat echo was performed which showed an aortic root abscess.  He was recovering well but due to his severe aortic valve insufficiency and root abscess surgery was indicated. Echocardiography revealed aortic valve native valve infective endocarditis with severe aortic regurgitation, valve perforation seen on 3D imagery.  He underwent  Bentall procedure using on-x aortic valve size 23 mm and peri-guard 4 cm x 4 cm pericardium.  He is approximately 2 months out from discharge after prolonged hospitalization for above-mentioned problems status post aortic valve repair/replacement/aortic root abscess.  He states he has been doing well.  He is currently working.  Denies any recreational drug use.  Denies  any anginal or exertional symptoms, palpitations or arrhythmias, orthostatic symptoms, CVA or TIA-like symptoms, fever, malaise.  Denies any PND, orthopnea, bleeding.  States he is compliant with all of his current medications.  Current regimen includes diltiazem 180 mg p.o. daily, Coumadin 5 mg per Coumadin clinic.  Is midsternal incision site is clean and well-healed.  He denies any sequelae from his surgery.  Vital signs are stable with blood pressure of 132/82.  Heart rate of 84.      Past Medical History:  Diagnosis Date   Ruptured lumbar disc     Past Surgical History:  Procedure Laterality Date   AORTIC/RENAL BYPASS     BENTALL PROCEDURE N/A 08/03/2020   Procedure: BENTALL PROCEDURE USING ON-X AORTIC VALVE SIZE 23 MM AND PERI-GUARD 4CM X 4CM PERICARDIUM;  Surgeon: Alex Matte, MD;  Location: [place];  Service: Open Heart Surgery;  Laterality: N/A;   IR FLUORO GUIDE CV LINE RIGHT  08/01/2020   IR REMOVAL TUN CV CATH W/O FL  08/29/2020   IR US GUIDE VASC ACCESS RIGHT  08/01/2020   TEE WITHOUT CARDIOVERSION N/A 08/03/2020   Procedure: TRANSESOPHAGEAL ECHOCARDIOGRAM (TEE);  Surgeon: Alex Matte, MD;  Location: [employer];  Service: Open Heart Surgery;  Laterality: N/A;    Current Outpatient Medications  Medication Sig Dispense Refill   buprenorphine-naloxone (SUBOXONE) 8-2 mg SUBL SL tablet Place 1 tablet under the tongue See admin instructions. Place one tablet under the tongue twice daily. May place additional one-half tablet under tongue as needed for symptoms of withdrawal.     diltiazem (CARDIZEM CD) 180 MG 24 hr capsule Take 1 capsule (180 mg total) by mouth daily. 30 capsule 0  Iron Polysacch Cmplx-B12-FA 150-0.025-1 MG CAPS Take 1 capsule by mouth daily. 30 capsule 0   senna-docusate (SENOKOT-S) 8.6-50 MG tablet Take 1 tablet by mouth 2 (two) times daily. 60 tablet 0   warfarin (COUMADIN) 5 MG tablet Take 1 tablet (5 mg total) by mouth daily. 45 tablet 3   acetaminophen  (TYLENOL) 325 MG tablet Take 2 tablets (650 mg total) by mouth every 6 (six) hours as needed for mild pain (or Fever >/= 101). (Patient not taking: No sig reported)     lidocaine (LIDODERM) 5 % Place 1 patch onto the skin daily. Remove & Discard patch within 12 hours or as directed by MD (Patient not taking: Reported on 11/13/2020) 30 patch 0   melatonin 3 MG TABS tablet Take 1 tablet (3 mg total) by mouth at bedtime. (Patient not taking: No sig reported)  0   methocarbamol (ROBAXIN) 500 MG tablet Take 1 tablet (500 mg total) by mouth 3 (three) times daily. (Patient not taking: Reported on 11/13/2020) 30 tablet 0   Multiple Vitamin (MULTIVITAMIN WITH MINERALS) TABS tablet Take 1 tablet by mouth daily. (Patient not taking: Reported on 11/13/2020)     No current facility-administered medications for this visit.   Allergies:  Patient has no known allergies.   Social History: The patient  reports that he has been smoking cigarettes. He has been smoking an average of 1 pack per day. He has never used smokeless tobacco. He reports current drug use. Drug: Marijuana. He reports that he does not drink alcohol.   Family History: The patient's family history is not on file.   ROS:  Please see the history of present illness. Otherwise, complete review of systems is positive for none.  All other systems are reviewed and negative.   Physical Exam: VS:  BP 132/82   Pulse 84   Ht '6\' 4"'$  (1.93 m)   Wt 259 lb 6.4 oz (117.7 kg)   SpO2 98%   BMI 31.58 kg/m , BMI Body mass index is 31.58 kg/m.  Wt Readings from Last 3 Encounters:  11/13/20 259 lb 6.4 oz (117.7 kg)  09/22/20 252 lb (114.3 kg)  09/19/20 251 lb (113.9 kg)    General: Patient appears comfortable at rest. Neck: Supple, no elevated JVP or carotid bruits, no thyromegaly. Lungs: Clear to auscultation, nonlabored breathing at rest. Cardiac: Regular rate and rhythm, no S3 or significant systolic murmur, no pericardial rub. Extremities: No pitting  edema, distal pulses 2+. Skin: Warm and dry. Musculoskeletal: No kyphosis. Neuropsychiatric: Alert and oriented x3, affect grossly appropriate.  ECG:    Recent Labwork: 07/26/2020: B Natriuretic Peptide 76.0 08/23/2020: TSH 2.140 08/26/2020: Magnesium 2.0 09/14/2020: ALT 9; AST 15; Hemoglobin 10.3; Platelets 208 09/15/2020: BUN 23; Creatinine, Ser 2.87; Potassium 3.4; Sodium 136     Component Value Date/Time   CHOL 138 07/25/2020 0334   TRIG 500 (H) 07/28/2020 0727   HDL <10 (L) 07/25/2020 0334   CHOLHDL UNABLE TO CALCULATE. 07/25/2020 0334   VLDL 70 (H) 07/25/2020 0334   LDLCALC NOT CALCULATED 07/25/2020 0334   LDLDIRECT 57.1 07/26/2020 0331    Other Studies Reviewed Today:  Aortic valve replacement 08/03/2020 Dr Kipp Brood. Procedure: Aortic valve replacement with a 23 mm On-X valve Aortic root reconstruction with bovine pericardium Closure of aortic sinus track with bovine pericardium     Lower venous duplex study 08/31/2020 Summary: RIGHT: - There is no evidence of deep vein thrombosis in the lower extremity. However, portions of this examination were  limited- see technologist comments above. - No cystic structure found in the popliteal fossa. LEFT: - There is no evidence of deep vein thrombosis in the lower extremity. However, portions of this examination were limited- see technologist comments above. - No cystic structure found in the popliteal fossa.   Lower venous duplex 08/09/2020 RIGHT: - There is no evidence of deep vein thrombosis in the lower extremity. - No cystic structure found in the popliteal fossa. LEFT: - No evidence of common femoral vein obstruction.   Intraoperative TEE 08/10/2020   Left Ventricle: has normal systolic function, with an ejection fraction of 50%. The cavity size was normal. The wall motion is normal.  Right Ventricle: normal function. The cavity was normal. The wall motion is normal.  Aorta: there is no dissection present in the aorta  A graft was placed patch graft under left main for repair.  Aortic Valve: A tilting disk mechanical valve was placed, leaflets are freely mobile. Normal washing jets for valve type. The gradient recorded across the prosthetic valve is within the expected range, measuring 11 cm/s.  Mitral Valve: The mitral valve appears unchanged from pre-bypass.  Tricuspid Valve: The tricuspid valve appears unchanged from pre-bypass.   TEE 07/26/2020  1. 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 2. Type 1d Aortic Regurgitation: Perforated left coronary cusp with severe, eccentric aortic regurgitation. Large 36 x 14 mm mobile vegetation on ventricular surface of left coronary cusp. . The aortic valve is abnormal. Aortic valve regurgitation is severe. 3. Left ventricular ejection fraction, by estimation, is 60 to 65%. The left ventricle has normal function. The left ventricle has no regional wall motion abnormalities. 4. Right ventricular systolic function is normal. The right ventricular size is normal. 5. No left atrial/left atrial appendage thrombus was detected. 6. The mitral valve is normal in structure. No evidence of mitral valve regurgitation. Comparison(s): A prior study was performed on 07/20/2020. New aortic abscess. Notified CT surgery, ICU team, and family post procedure.     Lower extremity duplex study 07/25/2020 Summary: RIGHT: - There is no evidence of deep vein thrombosis in the lower extremity. - No cystic structure found in the popliteal fossa. LEFT: - No evidence of common femoral vein obstruction.    Carotid artery duplex 07/26/2020 Right Carotid: There was no evidence of thrombus, dissection, atherosclerotic plaque or stenosis in the cervical carotid system. Left Carotid: Unable to insonate secondary to Dialysis access in left jugular. Vertebrals: Right vertebral artery demonstrates antegrade flow. Subclavians: Normal flow  hemodynamics were seen in bilateral subclavian arteries.    TEE 07/20/2020 1. Left ventricular ejection fraction, by estimation, is 60 to 65%. The left ventricle has normal function. The left ventricle has no regional wall motion abnormalities. 2. Right ventricular systolic function is normal. The right ventricular size is normal. 3. No left atrial/left atrial appendage thrombus was detected. 4. The mitral valve is normal in structure. No evidence of mitral valve regurgitation. 5. Perforated left coronary cusp with severe aortic regurgitation. Large 36 x 53m mobile vegetation on ventricular surface of left coronary cusp. The aortic valve is abnormal. Aortic valve regurgitation is severe. 6. Intubated, ETT in place.1 Conclusion(s)/Recommendation(s): Aortic valve endocarditis with severe aortic regurgitation.     Echocardiogram 07/19/2020 1. Left ventricular ejection fraction, by estimation, is 60 to 65%. The left ventricle has normal function. The left ventricle has no regional wall motion abnormalities. Left ventricular diastolic parameters were normal. 2. Right ventricular systolic  function is normal. The right ventricular size is normal. Tricuspid regurgitation signal is inadequate for assessing PA pressure. 3. The mitral valve is normal in structure. No evidence of mitral valve regurgitation. 4. The aortic valve was not well visualized. Aortic valve regurgitation is severe. No aortic stenosis is present. Poor visualization of aortic valve but leaflets appears thickened, concerning for vegetation on AV causing eccentric AI that appears severe. Holodiastolic flow reveral is seen in thoracic aorta, consistent with severe AI. Recommend TEE for further Evaluation  MRI of the brain 07/22/2020 Dozens of primarily punctate acute infarctions scattered throughout the cerebellum and both cerebral hemispheres consistent with micro embolic infarctions from the heart or ascending aorta. Several  are associated with petechial blood products as outlined above. Septic emboli not excluded given the clinical history.   Assessment and Plan:  1. Status post mechanical aortic valve replacement   2. Atrial fibrillation with RVR (HCC)   3. Cerebral infarction due to embolism of cerebral artery ([place])   4. Aortic valve endocarditis   5. Acute renal failure with oliguria (HCC)    1. Status post mechanical aortic valve replacement 08/03/2020 Dr. Kipp Brood: Aortic valve replacement with a 23 mm On-X valve.  Aortic root reconstruction with bovine pericardium. Closure of aortic sinus track with bovine pericardium.  Patient states he has been doing well since surgery.  Median sternotomy scar well-healed.  Continue Coumadin 5 mg.  1-1/2 tablets except 2 tablets on Mondays.  Follow in Coumadin clinic  2. Atrial fibrillation with RVR ([place]) Patient had postop atrial fibrillation.  He denies any recurrence.  Continue Cardizem 180 mg daily.  Continue Coumadin as directed.  Follow in Coumadin clinic  3. Cerebral infarction due to embolism of cerebral artery (HCC) Recent acute infective endocarditis with septic emboli to brain.  MRI brain on 07/23/2018 demonstrated dozens of primarily punctate acute infarctions scattered throughout the cerebellum and both cerebral hemispheres consistent with microembolic infarctions from the heart of ascending aorta.  Several are associated with petechial blood products as a site outlined.  Septic emboli not excluded given clinical history  4. Aortic valve endocarditis Initial echocardiogram on 07/19/2020 revealed severe aortic valve regurgitation.  Poor visualization of aortic valve the leaflets appeared thickened concerning for vegetation on AV causing eccentric AI that appears severe.  Holodiastolic flow reversal was seen in thoracic aortic consistent with severe AI.  A follow-up TEE on the next day showed perforated left coronary cusp with severe aortic regurgitation.  Large  36 x 15 mm mobile vegetation on ventricular surface of the left coronary cusp.  Aortic valve is abnormal, aortic regurgitation was severe.   5. Acute renal failure with oliguria (HCC) Initial creatinine on 07/18/2020 was 5.13 with GFR of 14.  Creatinine on 09/15/2020 was 2.87 and GFR of 28.  He had temporary dialysis during hospital stay.  His dialysis catheter has since been removed.  On 10/13/2020 he was scheduled for nephrology consultation [facility]..  Medication Adjustments/Labs and Tests Ordered: Current medicines are reviewed at length with the patient today.  Concerns regarding medicines are outlined above.   Disposition: Follow-up with Dr. Harl Bowie or APP 6 months  Signed, Levell July, NP 11/13/2020 4:34 PM    [facility] [place], [place], [place] [address] Phone: [phone]; Fax: [phone]

## 2020-11-13 ENCOUNTER — Encounter: Payer: Self-pay | Admitting: Family Medicine

## 2020-11-13 ENCOUNTER — Ambulatory Visit (INDEPENDENT_AMBULATORY_CARE_PROVIDER_SITE_OTHER): Payer: 59 | Admitting: Family Medicine

## 2020-11-13 ENCOUNTER — Ambulatory Visit (INDEPENDENT_AMBULATORY_CARE_PROVIDER_SITE_OTHER): Payer: 59 | Admitting: *Deleted

## 2020-11-13 VITALS — BP 132/82 | HR 84 | Ht 76.0 in | Wt 259.4 lb

## 2020-11-13 DIAGNOSIS — I4891 Unspecified atrial fibrillation: Secondary | ICD-10-CM

## 2020-11-13 DIAGNOSIS — Z952 Presence of prosthetic heart valve: Secondary | ICD-10-CM

## 2020-11-13 DIAGNOSIS — I6340 Cerebral infarction due to embolism of unspecified cerebral artery: Secondary | ICD-10-CM | POA: Diagnosis not present

## 2020-11-13 DIAGNOSIS — R34 Anuria and oliguria: Secondary | ICD-10-CM

## 2020-11-13 DIAGNOSIS — Z5181 Encounter for therapeutic drug level monitoring: Secondary | ICD-10-CM

## 2020-11-13 DIAGNOSIS — N179 Acute kidney failure, unspecified: Secondary | ICD-10-CM

## 2020-11-13 DIAGNOSIS — I358 Other nonrheumatic aortic valve disorders: Secondary | ICD-10-CM | POA: Diagnosis not present

## 2020-11-13 DIAGNOSIS — I63413 Cerebral infarction due to embolism of bilateral middle cerebral arteries: Secondary | ICD-10-CM

## 2020-11-13 LAB — POCT INR: INR: 2.2 (ref 2.0–3.0)

## 2020-11-13 NOTE — Patient Instructions (Signed)
Change to WARFARIN '5MG'$  TABLET Increase warfarin to 1 1/2 tablets daily except 2 tablets on Mondays Recheck in 2 weeks

## 2020-11-13 NOTE — Patient Instructions (Addendum)
Medication Instructions:  Continue all current medications.   Labwork: none  Testing/Procedures: none  Follow-Up: 6 months   Any Other Special Instructions Will Be Listed Below (If Applicable).   If you need a refill on your cardiac medications before your next appointment, please call your pharmacy.  

## 2020-12-04 ENCOUNTER — Ambulatory Visit (INDEPENDENT_AMBULATORY_CARE_PROVIDER_SITE_OTHER): Payer: 59 | Admitting: *Deleted

## 2020-12-04 DIAGNOSIS — I4891 Unspecified atrial fibrillation: Secondary | ICD-10-CM | POA: Diagnosis not present

## 2020-12-04 DIAGNOSIS — I63413 Cerebral infarction due to embolism of bilateral middle cerebral arteries: Secondary | ICD-10-CM | POA: Diagnosis not present

## 2020-12-04 DIAGNOSIS — Z5181 Encounter for therapeutic drug level monitoring: Secondary | ICD-10-CM

## 2020-12-04 LAB — POCT INR: INR: 2.6 (ref 2.0–3.0)

## 2020-12-04 NOTE — Patient Instructions (Signed)
Continue warfarin 1 1/2 tablets daily except 2 tablets on Mondays Recheck in 4 weeks

## 2021-01-01 ENCOUNTER — Ambulatory Visit (INDEPENDENT_AMBULATORY_CARE_PROVIDER_SITE_OTHER): Payer: 59 | Admitting: *Deleted

## 2021-01-01 DIAGNOSIS — I4891 Unspecified atrial fibrillation: Secondary | ICD-10-CM

## 2021-01-01 DIAGNOSIS — I63413 Cerebral infarction due to embolism of bilateral middle cerebral arteries: Secondary | ICD-10-CM | POA: Diagnosis not present

## 2021-01-01 DIAGNOSIS — Z5181 Encounter for therapeutic drug level monitoring: Secondary | ICD-10-CM | POA: Diagnosis not present

## 2021-01-01 LAB — POCT INR: INR: 2.2 (ref 2.0–3.0)

## 2021-01-01 NOTE — Patient Instructions (Signed)
Increase warfarin to 1 1/2 tablets daily except 2 tablets on Mondays, Wednesdays and Fridays Recheck in 3 weeks

## 2021-01-24 ENCOUNTER — Ambulatory Visit (INDEPENDENT_AMBULATORY_CARE_PROVIDER_SITE_OTHER): Payer: 59 | Admitting: *Deleted

## 2021-01-24 DIAGNOSIS — Z5181 Encounter for therapeutic drug level monitoring: Secondary | ICD-10-CM | POA: Diagnosis not present

## 2021-01-24 DIAGNOSIS — I4891 Unspecified atrial fibrillation: Secondary | ICD-10-CM

## 2021-01-24 DIAGNOSIS — I63413 Cerebral infarction due to embolism of bilateral middle cerebral arteries: Secondary | ICD-10-CM

## 2021-01-24 LAB — POCT INR: INR: 2.6 (ref 2.0–3.0)

## 2021-01-24 MED ORDER — WARFARIN SODIUM 5 MG PO TABS: ORAL_TABLET | ORAL | 5 refills | Status: DC

## 2021-01-24 NOTE — Patient Instructions (Signed)
Continue warfarin 1 1/2 tablets daily except 2 tablets on Mondays, Wednesdays and Fridays Recheck in 4 weeks

## 2021-02-21 ENCOUNTER — Ambulatory Visit (INDEPENDENT_AMBULATORY_CARE_PROVIDER_SITE_OTHER): Payer: 59 | Admitting: *Deleted

## 2021-02-21 DIAGNOSIS — I63413 Cerebral infarction due to embolism of bilateral middle cerebral arteries: Secondary | ICD-10-CM

## 2021-02-21 DIAGNOSIS — I4891 Unspecified atrial fibrillation: Secondary | ICD-10-CM | POA: Diagnosis not present

## 2021-02-21 DIAGNOSIS — Z5181 Encounter for therapeutic drug level monitoring: Secondary | ICD-10-CM | POA: Diagnosis not present

## 2021-02-21 LAB — POCT INR: INR: 1.7 — AB (ref 2.0–3.0)

## 2021-02-21 NOTE — Patient Instructions (Signed)
Take warfarin 3 tablets tonight, 2 1/2 tablets tomorrow night then increase dose to 2 tablets daily except 1 1/2 tablets on Sundays, Tuesdays and Thursdays Recheck in 2 weeks

## 2021-03-07 ENCOUNTER — Ambulatory Visit (INDEPENDENT_AMBULATORY_CARE_PROVIDER_SITE_OTHER): Payer: 59 | Admitting: *Deleted

## 2021-03-07 DIAGNOSIS — I4891 Unspecified atrial fibrillation: Secondary | ICD-10-CM

## 2021-03-07 DIAGNOSIS — Z5181 Encounter for therapeutic drug level monitoring: Secondary | ICD-10-CM | POA: Diagnosis not present

## 2021-03-07 DIAGNOSIS — I63413 Cerebral infarction due to embolism of bilateral middle cerebral arteries: Secondary | ICD-10-CM | POA: Diagnosis not present

## 2021-03-07 LAB — POCT INR: INR: 1.6 — AB (ref 2.0–3.0)

## 2021-03-07 NOTE — Patient Instructions (Signed)
Take warfarin 3 tablets tonight then increase dose to 2 tablets daily Recheck in 1 week

## 2021-03-22 ENCOUNTER — Ambulatory Visit (INDEPENDENT_AMBULATORY_CARE_PROVIDER_SITE_OTHER): Payer: 59 | Admitting: *Deleted

## 2021-03-22 ENCOUNTER — Other Ambulatory Visit: Payer: Self-pay

## 2021-03-22 DIAGNOSIS — Z5181 Encounter for therapeutic drug level monitoring: Secondary | ICD-10-CM | POA: Diagnosis not present

## 2021-03-22 DIAGNOSIS — I63413 Cerebral infarction due to embolism of bilateral middle cerebral arteries: Secondary | ICD-10-CM | POA: Diagnosis not present

## 2021-03-22 DIAGNOSIS — I4891 Unspecified atrial fibrillation: Secondary | ICD-10-CM | POA: Diagnosis not present

## 2021-03-22 LAB — POCT INR: INR: 4.5 — AB (ref 2.0–3.0)

## 2021-03-22 NOTE — Patient Instructions (Signed)
Hold warfarin tonight then decrease dose to 2 tablets daily except 1 1/2 tablets on Mondays, Wednesdays and Fridays Recheck in 1 week

## 2021-04-05 ENCOUNTER — Ambulatory Visit (INDEPENDENT_AMBULATORY_CARE_PROVIDER_SITE_OTHER): Payer: 59 | Admitting: *Deleted

## 2021-04-05 DIAGNOSIS — Z5181 Encounter for therapeutic drug level monitoring: Secondary | ICD-10-CM | POA: Diagnosis not present

## 2021-04-05 DIAGNOSIS — I4891 Unspecified atrial fibrillation: Secondary | ICD-10-CM | POA: Diagnosis not present

## 2021-04-05 DIAGNOSIS — I63413 Cerebral infarction due to embolism of bilateral middle cerebral arteries: Secondary | ICD-10-CM

## 2021-04-05 LAB — POCT INR: INR: 2.9 (ref 2.0–3.0)

## 2021-04-05 NOTE — Patient Instructions (Signed)
Continue warfarin 2 tablets daily except 1 1/2 tablets on Mondays, Wednesdays and Fridays Recheck in 3 weeks

## 2021-04-30 ENCOUNTER — Telehealth: Payer: Self-pay | Admitting: Cardiology

## 2021-04-30 NOTE — Telephone Encounter (Signed)
Alex [name] with [facility] is faxing labs to the office this afternoon. They are hopeful that these labs can be completed during INR tomorrow at 10:00 AM. If not, Alex [name] states a later date would also be fine, but they would like to have the results faxed back to their office.   Phone #: [phone] (ext#: [phone]) Fax#: [phone]

## 2021-04-30 NOTE — Telephone Encounter (Signed)
Message left on voicemail that we do not do venipuncture during INR checks INR's done by fingerstick

## 2021-05-01 ENCOUNTER — Other Ambulatory Visit: Payer: Self-pay

## 2021-05-01 ENCOUNTER — Ambulatory Visit (INDEPENDENT_AMBULATORY_CARE_PROVIDER_SITE_OTHER): Payer: 59 | Admitting: *Deleted

## 2021-05-01 DIAGNOSIS — Z5181 Encounter for therapeutic drug level monitoring: Secondary | ICD-10-CM | POA: Diagnosis not present

## 2021-05-01 DIAGNOSIS — I4891 Unspecified atrial fibrillation: Secondary | ICD-10-CM | POA: Diagnosis not present

## 2021-05-01 DIAGNOSIS — I63413 Cerebral infarction due to embolism of bilateral middle cerebral arteries: Secondary | ICD-10-CM | POA: Diagnosis not present

## 2021-05-01 LAB — POCT INR: INR: 1.6 — AB (ref 2.0–3.0)

## 2021-05-01 NOTE — Patient Instructions (Signed)
Pt pending dental extraction today.  Denies missing any doses of warfarin. Take warfarin 3 tablets tonight, 2 1/2 tablets tomorrow night then resume 2 tablets daily except 1 1/2 tablets on Mondays, Wednesdays and Fridays Recheck in 1 week

## 2021-05-10 ENCOUNTER — Ambulatory Visit (INDEPENDENT_AMBULATORY_CARE_PROVIDER_SITE_OTHER): Payer: 59 | Admitting: *Deleted

## 2021-05-10 DIAGNOSIS — I63413 Cerebral infarction due to embolism of bilateral middle cerebral arteries: Secondary | ICD-10-CM

## 2021-05-10 DIAGNOSIS — Z5181 Encounter for therapeutic drug level monitoring: Secondary | ICD-10-CM | POA: Diagnosis not present

## 2021-05-10 DIAGNOSIS — I4891 Unspecified atrial fibrillation: Secondary | ICD-10-CM | POA: Diagnosis not present

## 2021-05-10 LAB — POCT INR: INR: 1.8 — AB (ref 2.0–3.0)

## 2021-05-10 NOTE — Patient Instructions (Signed)
Take warfarin 3 tablets tonight then increase dose to 2 tablets daily Recheck in 2 weeks

## 2021-05-15 ENCOUNTER — Ambulatory Visit (INDEPENDENT_AMBULATORY_CARE_PROVIDER_SITE_OTHER): Payer: 59 | Admitting: Internal Medicine

## 2021-05-15 ENCOUNTER — Other Ambulatory Visit: Payer: Self-pay

## 2021-05-15 ENCOUNTER — Encounter: Payer: Self-pay | Admitting: Internal Medicine

## 2021-05-15 VITALS — BP 140/98 | HR 78 | Ht 76.0 in | Wt 289.0 lb

## 2021-05-15 DIAGNOSIS — I10 Essential (primary) hypertension: Secondary | ICD-10-CM

## 2021-05-15 DIAGNOSIS — Z952 Presence of prosthetic heart valve: Secondary | ICD-10-CM

## 2021-05-15 DIAGNOSIS — I4891 Unspecified atrial fibrillation: Secondary | ICD-10-CM | POA: Diagnosis not present

## 2021-05-15 MED ORDER — AMOXICILLIN 500 MG PO CAPS: ORAL_CAPSULE | ORAL | 3 refills | Status: DC

## 2021-05-15 MED ORDER — ASPIRIN EC 81 MG PO TBEC: 81.0000 mg | DELAYED_RELEASE_TABLET | Freq: Every day | ORAL | 3 refills | Status: AC

## 2021-05-15 NOTE — Progress Notes (Signed)
OFFICE NOTE  Chief Complaint:  Follow-up mechanical aortic valve  Primary Care Physician: No primary care provider on file.  HPI:  Alex [name] is a 41 y.o. 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  Acute renal failure, cerebral embolism and infarction, A-fib with RVR and other medical problems.  He underwent mechanical AVR as well as Bentall procedure with a 23 mm on-X aortic valve and 4 x 4 cm PeriGuard aortic root grafting by Dr. Kipp [name] on Aug 03, 2020.  Been back in follow-up in July 2022 and doing well.  His INRs have been therapeutic.  He already apparently has had dental work and did take amoxicillin prior to that.  We will make sure that that is ordered and available and he was instructed the importance of that.  In addition guidelines recommend low-dose aspirin on top of warfarin for an On-X valve replacement. Since he had stroke and afib associated with his valve disease, target INR is 2.5.  Otherwise he reports doing well.  He had an episode of chest pain which was sharp and positional that lasted for for 5 days but is now resolved.  It was not associated with exertion or relieved by rest.  PMHx:  Past Medical History:  Diagnosis Date   Atrial fibrillation ([place])    Ruptured lumbar disc     Past Surgical History:  Procedure Laterality Date   AORTIC/RENAL BYPASS     BENTALL PROCEDURE N/A 08/03/2020   Procedure: BENTALL PROCEDURE USING ON-X AORTIC VALVE SIZE 23 MM AND PERI-GUARD 4CM X 4CM PERICARDIUM;  Surgeon: Alex Matte, MD;  Location: [place];  Service: Open Heart Surgery;  Laterality: N/A;   IR FLUORO GUIDE CV LINE RIGHT  08/01/2020   IR REMOVAL TUN CV CATH W/O FL  08/29/2020   IR US GUIDE VASC ACCESS RIGHT  08/01/2020   TEE WITHOUT CARDIOVERSION N/A 08/03/2020   Procedure: TRANSESOPHAGEAL ECHOCARDIOGRAM (TEE);  Surgeon: Alex Matte, MD;  Location: [place];  Service: Open Heart  Surgery;  Laterality: N/A;    FAMHx:  Family History  Problem Relation Age of Onset   Diabetes Mother    Heart failure Father    Pneumonia Father    Kidney Stones Brother    Cancer Maternal Grandmother    Cancer Maternal Grandfather    Cancer Paternal Grandmother     SOCHx:   reports that he has been smoking cigarettes. He has been smoking an average of 1 pack per day. He has never used smokeless tobacco. He reports that he does not currently use drugs after having used the following drugs: Marijuana. He reports that he does not drink alcohol.  ALLERGIES:  No Known Allergies  ROS: Pertinent items noted in HPI and remainder of comprehensive ROS otherwise negative.  HOME MEDS: Current Outpatient Medications on File Prior to Visit  Medication Sig Dispense Refill   diltiazem (CARDIZEM CD) 180 MG 24 hr capsule Take 1 capsule (180 mg total) by mouth daily. 30 capsule 0   Iron Polysacch Cmplx-B12-FA 150-0.025-1 MG CAPS Take 1 capsule by mouth daily. 30 capsule 0   senna-docusate (SENOKOT-S) 8.6-50 MG tablet Take 1 tablet by mouth 2 (two) times daily. 60 tablet 0   warfarin (COUMADIN) 5 MG tablet Take 1 1/2 to 2 tablets daily or as directed 60 tablet 5   buprenorphine-naloxone (SUBOXONE) 8-2 mg SUBL SL tablet Place 1 tablet under the tongue See  admin instructions. Place one tablet under the tongue twice daily. May place additional one-half tablet under tongue as needed for symptoms of withdrawal. (Patient not taking: Reported on 05/15/2021)     No current facility-administered medications on file prior to visit.    LABS/IMAGING: No results found for this or any previous visit (from the past 48 hour(s)). No results found.  LIPID PANEL:    Component Value Date/Time   CHOL 138 07/25/2020 0334   TRIG 500 (H) 07/28/2020 0727   HDL <10 (L) 07/25/2020 0334   CHOLHDL UNABLE TO CALCULATE. 07/25/2020 0334   VLDL 70 (H) 07/25/2020 0334   LDLCALC NOT CALCULATED 07/25/2020 0334   LDLDIRECT  57.1 07/26/2020 0331     WEIGHTS: Wt Readings from Last 3 Encounters:  05/15/21 289 lb (131.1 kg)  11/13/20 259 lb 6.4 oz (117.7 kg)  09/22/20 252 lb (114.3 kg)    VITALS: BP (!) 140/98    Pulse 78    Ht 6\' 4"  (1.93 m)    Wt 289 lb (131.1 kg)    SpO2 98%    BMI 35.18 kg/m   EXAM: General appearance: alert and no distress Neck: no carotid bruit, no JVD, and thyroid not enlarged, symmetric, no tenderness/mass/nodules Lungs: clear to auscultation bilaterally Heart: regular rate and rhythm, S1, S2 normal, and sharp mechanical valve sound heard Abdomen: soft, non-tender; bowel sounds normal; no masses,  no organomegaly Extremities: extremities normal, atraumatic, no cyanosis or edema Pulses: 2+ and symmetric Skin: Skin color, texture, turgor normal. No rashes or lesions Neurologic: Grossly normal Psych: Pleasant  EKG: Deferred  ASSESSMENT: History of aortic valve endocarditis status post 23 mm On-X mechanical AVR (07/2020) and Bentall root repair History of acute renal failure-resolved Hypertension Atrial fibrillation with rapid ventricular response-resolved Cerebral infarction secondary to endocarditis and/or afib  PLAN: 1.   Alex [name] is doing well and back to work no more than a year.  He has an On-X mechanical AVR but since he had comorbidities the target INR is 2.5 whereas it could be potentially as low as 1.5 and a lower risk patient.  This valve specifically requires aspirin in addition to warfarin for anticoagulation/antiplatelet benefit.  Recommend starting aspirin 81 mg daily.  Continue warfarin to target INR around 2.5.  He was advised to continue using endocarditis prophylaxis with amoxicillin prior to dental procedures.  Blood pressure was noted to be elevated.  He recently saw nephrology, I suspect Alex [name], and there was discussion about possibly switching his diltiazem to a different blood pressure medication.  I will reach out to her and deferred to her  recommendations.  As he is not having any A-fib at this time, we can certainly consider amlodipine or perhaps verapamil.  We could always add a beta-blocker later if there is any recurrence.  We will repeat an echo as there was no echo performed to establish baseline although this was ordered.  Plan follow-up with me in 6 months or sooner as necessary.  Pixie Casino, MD, [facility], [place] Director of the [facility]id [facility]  Card[facility] Diplomate of the American Board of Clinical Lipidology Attending Cardiologist  Direct Dial: [phone]   Fax: [phone]  Website:  [email or web address] 05/15/2021, 1:04 PM

## 2021-05-15 NOTE — Patient Instructions (Addendum)
Medication Changes: Start Aspirin 81 mg tablets daily Use Amoxicillin antibiotic as directed.     Testing/Procedures: Your physician has requested that you have an echocardiogram. Echocardiography is a painless test that uses sound waves to create images of your heart. It provides your doctor with information about the size and shape of your heart and how well your hearts chambers and valves are working. This procedure takes approximately one hour. There are no restrictions for this procedure.   Follow-Up: Follow up with Dr. Debara Pickett in 6 months.   Any Other Special Instructions Will Be Listed Below (If Applicable).     If you need a refill on your cardiac medications before your next appointment, please call your pharmacy.

## 2021-05-24 ENCOUNTER — Other Ambulatory Visit: Payer: Self-pay

## 2021-05-24 ENCOUNTER — Ambulatory Visit (INDEPENDENT_AMBULATORY_CARE_PROVIDER_SITE_OTHER): Payer: 59 | Admitting: *Deleted

## 2021-05-24 DIAGNOSIS — I63413 Cerebral infarction due to embolism of bilateral middle cerebral arteries: Secondary | ICD-10-CM

## 2021-05-24 DIAGNOSIS — I4891 Unspecified atrial fibrillation: Secondary | ICD-10-CM | POA: Diagnosis not present

## 2021-05-24 DIAGNOSIS — Z5181 Encounter for therapeutic drug level monitoring: Secondary | ICD-10-CM | POA: Diagnosis not present

## 2021-05-24 LAB — POCT INR: INR: 5.4 — AB (ref 2.0–3.0)

## 2021-05-24 NOTE — Patient Instructions (Addendum)
Hold warfarin tonight and tomorrow night then decrease dose to 2 tablets daily except 1 1/2 tablets on Mondays, Wednesdays and Fridays ?Recheck in 1 week ?Pt denies excessive bruising or bleeding.  Bleeding and fall precautions discussed with pt and he verbalized understanding. ?

## 2021-06-01 ENCOUNTER — Other Ambulatory Visit: Payer: Self-pay

## 2021-06-01 ENCOUNTER — Ambulatory Visit (INDEPENDENT_AMBULATORY_CARE_PROVIDER_SITE_OTHER): Payer: 59 | Admitting: *Deleted

## 2021-06-01 DIAGNOSIS — Z5181 Encounter for therapeutic drug level monitoring: Secondary | ICD-10-CM | POA: Diagnosis not present

## 2021-06-01 DIAGNOSIS — I4891 Unspecified atrial fibrillation: Secondary | ICD-10-CM

## 2021-06-01 DIAGNOSIS — I63413 Cerebral infarction due to embolism of bilateral middle cerebral arteries: Secondary | ICD-10-CM | POA: Diagnosis not present

## 2021-06-01 LAB — POCT INR: INR: 2.4 (ref 2.0–3.0)

## 2021-06-01 NOTE — Patient Instructions (Signed)
Increase warfarin to 2 tablets daily except 1 1/2 tablets on Sundays and Wednesdays ?Recheck in 3 weeks ?

## 2021-06-07 ENCOUNTER — Ambulatory Visit (HOSPITAL_COMMUNITY): Admission: RE | Admit: 2021-06-07 | Payer: 59 | Source: Ambulatory Visit

## 2021-06-20 ENCOUNTER — Ambulatory Visit (INDEPENDENT_AMBULATORY_CARE_PROVIDER_SITE_OTHER): Payer: 59 | Admitting: *Deleted

## 2021-06-20 DIAGNOSIS — I4891 Unspecified atrial fibrillation: Secondary | ICD-10-CM

## 2021-06-20 DIAGNOSIS — I6349 Cerebral infarction due to embolism of other cerebral artery: Secondary | ICD-10-CM

## 2021-06-20 DIAGNOSIS — Z5181 Encounter for therapeutic drug level monitoring: Secondary | ICD-10-CM

## 2021-06-20 DIAGNOSIS — I63413 Cerebral infarction due to embolism of bilateral middle cerebral arteries: Secondary | ICD-10-CM | POA: Diagnosis not present

## 2021-06-20 LAB — POCT INR: INR: 1.3 — AB (ref 2.0–3.0)

## 2021-06-20 NOTE — Patient Instructions (Signed)
Take warfarin 3 tablets tonight and tomorrow the resume 2 tablets daily except 1 1/2 tablets on Sundays and Wednesdays ?Recheck in 2 wks ?

## 2021-07-11 ENCOUNTER — Ambulatory Visit (INDEPENDENT_AMBULATORY_CARE_PROVIDER_SITE_OTHER): Payer: 59 | Admitting: *Deleted

## 2021-07-11 DIAGNOSIS — I4891 Unspecified atrial fibrillation: Secondary | ICD-10-CM | POA: Diagnosis not present

## 2021-07-11 DIAGNOSIS — I63413 Cerebral infarction due to embolism of bilateral middle cerebral arteries: Secondary | ICD-10-CM

## 2021-07-11 DIAGNOSIS — Z5181 Encounter for therapeutic drug level monitoring: Secondary | ICD-10-CM

## 2021-07-11 LAB — POCT INR: INR: 2.7 (ref 2.0–3.0)

## 2021-07-11 NOTE — Patient Instructions (Signed)
Continue warfarin 2 tablets daily except 1 1/2 tablets on Sundays and Wednesdays ?Recheck in 3 wks ?

## 2021-08-01 ENCOUNTER — Ambulatory Visit (INDEPENDENT_AMBULATORY_CARE_PROVIDER_SITE_OTHER): Payer: 59 | Admitting: *Deleted

## 2021-08-01 DIAGNOSIS — Z5181 Encounter for therapeutic drug level monitoring: Secondary | ICD-10-CM

## 2021-08-01 DIAGNOSIS — I63413 Cerebral infarction due to embolism of bilateral middle cerebral arteries: Secondary | ICD-10-CM | POA: Diagnosis not present

## 2021-08-01 DIAGNOSIS — I4891 Unspecified atrial fibrillation: Secondary | ICD-10-CM | POA: Diagnosis not present

## 2021-08-01 LAB — POCT INR: INR: 2.7 (ref 2.0–3.0)

## 2021-08-01 NOTE — Patient Instructions (Signed)
Continue warfarin 2 tablets daily except 1 1/2 tablets on Sundays and Wednesdays ?Recheck in 4 wks ?

## 2021-08-29 ENCOUNTER — Ambulatory Visit (INDEPENDENT_AMBULATORY_CARE_PROVIDER_SITE_OTHER): Payer: 59 | Admitting: *Deleted

## 2021-08-29 DIAGNOSIS — I4891 Unspecified atrial fibrillation: Secondary | ICD-10-CM | POA: Diagnosis not present

## 2021-08-29 DIAGNOSIS — Z5181 Encounter for therapeutic drug level monitoring: Secondary | ICD-10-CM | POA: Diagnosis not present

## 2021-08-29 DIAGNOSIS — I63413 Cerebral infarction due to embolism of bilateral middle cerebral arteries: Secondary | ICD-10-CM | POA: Diagnosis not present

## 2021-08-29 LAB — POCT INR: INR: 4 — AB (ref 2.0–3.0)

## 2021-08-29 NOTE — Patient Instructions (Signed)
Hold warfarin tonight, take 1 tablet tomorrow then resume 2 tablets daily except 1 1/2 tablets on Sundays and Wednesdays Recheck in 3 wks

## 2021-09-27 ENCOUNTER — Ambulatory Visit (INDEPENDENT_AMBULATORY_CARE_PROVIDER_SITE_OTHER): Payer: 59 | Admitting: *Deleted

## 2021-09-27 DIAGNOSIS — I63413 Cerebral infarction due to embolism of bilateral middle cerebral arteries: Secondary | ICD-10-CM

## 2021-09-27 DIAGNOSIS — I4891 Unspecified atrial fibrillation: Secondary | ICD-10-CM

## 2021-09-27 DIAGNOSIS — Z5181 Encounter for therapeutic drug level monitoring: Secondary | ICD-10-CM | POA: Diagnosis not present

## 2021-09-27 LAB — POCT INR: INR: 2.3 (ref 2.0–3.0)

## 2021-09-27 NOTE — Patient Instructions (Signed)
Take warfarin 3 tablets tonight then resume 2 tablets daily except 1 1/2 tablets on Sundays and Wednesdays Recheck in 4 wks

## 2021-10-12 ENCOUNTER — Telehealth: Payer: Self-pay | Admitting: *Deleted

## 2021-10-12 ENCOUNTER — Other Ambulatory Visit: Payer: Self-pay

## 2021-10-12 MED ORDER — WARFARIN SODIUM 5 MG PO TABS: ORAL_TABLET | ORAL | 5 refills | Status: DC

## 2021-10-12 NOTE — Telephone Encounter (Signed)
[employer] Fax received - [facility] [place] requesting refill on Warfarin tab - 1 & 1/2 to 2 tabs daily or as directed.

## 2021-10-30 ENCOUNTER — Ambulatory Visit (INDEPENDENT_AMBULATORY_CARE_PROVIDER_SITE_OTHER): Payer: 59 | Admitting: *Deleted

## 2021-10-30 DIAGNOSIS — Z5181 Encounter for therapeutic drug level monitoring: Secondary | ICD-10-CM | POA: Diagnosis not present

## 2021-10-30 DIAGNOSIS — I4891 Unspecified atrial fibrillation: Secondary | ICD-10-CM

## 2021-10-30 DIAGNOSIS — I63413 Cerebral infarction due to embolism of bilateral middle cerebral arteries: Secondary | ICD-10-CM | POA: Diagnosis not present

## 2021-10-30 LAB — POCT INR: INR: 1.8 — AB (ref 2.0–3.0)

## 2021-10-30 NOTE — Patient Instructions (Signed)
Take warfarin 3 tablets tonight then increase dose to 2 tablets daily  Recheck in 2 wks

## 2021-11-22 ENCOUNTER — Ambulatory Visit: Payer: 59 | Attending: Cardiology | Admitting: *Deleted

## 2021-11-22 DIAGNOSIS — Z5181 Encounter for therapeutic drug level monitoring: Secondary | ICD-10-CM | POA: Diagnosis not present

## 2021-11-22 DIAGNOSIS — I63413 Cerebral infarction due to embolism of bilateral middle cerebral arteries: Secondary | ICD-10-CM | POA: Diagnosis not present

## 2021-11-22 DIAGNOSIS — I4891 Unspecified atrial fibrillation: Secondary | ICD-10-CM | POA: Diagnosis not present

## 2021-11-22 LAB — POCT INR: INR: 3.7 — AB (ref 2.0–3.0)

## 2021-11-22 NOTE — Patient Instructions (Signed)
Take warfarin 1 tablet tonight then decrease dose to 2 tablets daily except 1 1/2 tablets on Sundays Recheck in 3 wks

## 2021-11-23 ENCOUNTER — Ambulatory Visit: Payer: 59 | Admitting: Internal Medicine

## 2021-12-13 ENCOUNTER — Ambulatory Visit: Payer: 59 | Attending: Cardiology | Admitting: *Deleted

## 2021-12-13 DIAGNOSIS — Z5181 Encounter for therapeutic drug level monitoring: Secondary | ICD-10-CM

## 2021-12-13 DIAGNOSIS — I63413 Cerebral infarction due to embolism of bilateral middle cerebral arteries: Secondary | ICD-10-CM

## 2021-12-13 DIAGNOSIS — I4891 Unspecified atrial fibrillation: Secondary | ICD-10-CM

## 2021-12-13 LAB — POCT INR: INR: 2 (ref 2.0–3.0)

## 2021-12-13 NOTE — Patient Instructions (Signed)
Description   Take 3 tablets of warfarin today and then continue on same dose to 2 tablets daily except 1 1/2 tablets on Sundays Recheck in 2 wks

## 2022-01-01 ENCOUNTER — Ambulatory Visit: Payer: 59 | Attending: Cardiology | Admitting: *Deleted

## 2022-01-01 DIAGNOSIS — Z5181 Encounter for therapeutic drug level monitoring: Secondary | ICD-10-CM | POA: Diagnosis not present

## 2022-01-01 DIAGNOSIS — I4891 Unspecified atrial fibrillation: Secondary | ICD-10-CM

## 2022-01-01 DIAGNOSIS — I63413 Cerebral infarction due to embolism of bilateral middle cerebral arteries: Secondary | ICD-10-CM | POA: Diagnosis not present

## 2022-01-01 LAB — POCT INR: INR: 1.1 — AB (ref 2.0–3.0)

## 2022-01-01 NOTE — Patient Instructions (Signed)
Take 3 tablets of warfarin today and tomorrow then increase dose to 2 tablets daily except 3 tablets on Sundays Recheck in 1 wk

## 2022-01-10 ENCOUNTER — Ambulatory Visit: Payer: 59 | Attending: Cardiology | Admitting: *Deleted

## 2022-01-10 DIAGNOSIS — I63413 Cerebral infarction due to embolism of bilateral middle cerebral arteries: Secondary | ICD-10-CM | POA: Diagnosis not present

## 2022-01-10 DIAGNOSIS — I4891 Unspecified atrial fibrillation: Secondary | ICD-10-CM | POA: Diagnosis not present

## 2022-01-10 DIAGNOSIS — Z5181 Encounter for therapeutic drug level monitoring: Secondary | ICD-10-CM | POA: Diagnosis not present

## 2022-01-10 LAB — POCT INR: INR: 1.4 — AB (ref 2.0–3.0)

## 2022-01-10 NOTE — Patient Instructions (Signed)
Take 4 tablets today then increase dose to 3 tablets daily except 2 tablets on Mondays, Wednesdays and Fridays Recheck in 1 wk

## 2022-01-31 ENCOUNTER — Ambulatory Visit: Payer: 59 | Attending: Cardiology | Admitting: *Deleted

## 2022-01-31 DIAGNOSIS — Z5181 Encounter for therapeutic drug level monitoring: Secondary | ICD-10-CM

## 2022-01-31 DIAGNOSIS — I63413 Cerebral infarction due to embolism of bilateral middle cerebral arteries: Secondary | ICD-10-CM | POA: Diagnosis not present

## 2022-01-31 DIAGNOSIS — I4891 Unspecified atrial fibrillation: Secondary | ICD-10-CM | POA: Diagnosis not present

## 2022-01-31 LAB — POCT INR: INR: 8 — AB (ref 2.0–3.0)

## 2022-01-31 NOTE — Patient Instructions (Signed)
Hold warfarin 4 days (Thurs,Fri,Sat,Sun) , take 2 tablets Monday night and recheck Tuesday 02/05/22 Recheck in 1 wk Pt does not want to go to lab.  Bleeding and fall precautions discussed with pt and he verbalized understanding.

## 2022-02-05 ENCOUNTER — Ambulatory Visit: Payer: 59 | Attending: Cardiology | Admitting: *Deleted

## 2022-02-05 DIAGNOSIS — I4891 Unspecified atrial fibrillation: Secondary | ICD-10-CM | POA: Diagnosis not present

## 2022-02-05 DIAGNOSIS — Z5181 Encounter for therapeutic drug level monitoring: Secondary | ICD-10-CM | POA: Diagnosis not present

## 2022-02-05 DIAGNOSIS — I63413 Cerebral infarction due to embolism of bilateral middle cerebral arteries: Secondary | ICD-10-CM

## 2022-02-05 LAB — POCT INR
INR: 1.3 — AB (ref 2.0–3.0)
INR: 1.3 — AB (ref 2.0–3.0)

## 2022-02-05 NOTE — Patient Instructions (Signed)
Take warfarin 3 tablets tonight and tomorrow night then start 2 tablets daily except 3 tablets on Tuesdays and Saturdays Recheck in 2 wk

## 2022-02-21 ENCOUNTER — Ambulatory Visit: Payer: 59 | Attending: Cardiology | Admitting: *Deleted

## 2022-02-21 DIAGNOSIS — I4891 Unspecified atrial fibrillation: Secondary | ICD-10-CM | POA: Diagnosis not present

## 2022-02-21 DIAGNOSIS — Z5181 Encounter for therapeutic drug level monitoring: Secondary | ICD-10-CM

## 2022-02-21 DIAGNOSIS — I63413 Cerebral infarction due to embolism of bilateral middle cerebral arteries: Secondary | ICD-10-CM | POA: Diagnosis not present

## 2022-02-21 LAB — POCT INR: INR: 4.4 — AB (ref 2.0–3.0)

## 2022-02-21 NOTE — Patient Instructions (Signed)
Hold warfarin tonight then decrease warfarin to 2 tablets daily Recheck in 2 wk

## 2022-03-06 ENCOUNTER — Ambulatory Visit: Payer: 59 | Attending: Cardiology | Admitting: *Deleted

## 2022-03-06 DIAGNOSIS — Z5181 Encounter for therapeutic drug level monitoring: Secondary | ICD-10-CM | POA: Diagnosis not present

## 2022-03-06 DIAGNOSIS — I4891 Unspecified atrial fibrillation: Secondary | ICD-10-CM | POA: Diagnosis not present

## 2022-03-06 DIAGNOSIS — I63413 Cerebral infarction due to embolism of bilateral middle cerebral arteries: Secondary | ICD-10-CM | POA: Diagnosis not present

## 2022-03-06 LAB — POCT INR: INR: 3.3 — AB (ref 2.0–3.0)

## 2022-03-06 NOTE — Patient Instructions (Signed)
Continue warfarin 2 tablets daily Recheck in 4 wk

## 2022-03-15 ENCOUNTER — Ambulatory Visit: Payer: 59 | Attending: Internal Medicine | Admitting: Internal Medicine

## 2022-03-15 VITALS — BP 149/95 | HR 89 | Ht 76.0 in | Wt 287.4 lb

## 2022-03-15 DIAGNOSIS — I4891 Unspecified atrial fibrillation: Secondary | ICD-10-CM | POA: Diagnosis not present

## 2022-03-15 DIAGNOSIS — I358 Other nonrheumatic aortic valve disorders: Secondary | ICD-10-CM | POA: Diagnosis not present

## 2022-03-15 DIAGNOSIS — Z952 Presence of prosthetic heart valve: Secondary | ICD-10-CM

## 2022-03-15 NOTE — Patient Instructions (Signed)
Medication Instructions:  Your physician recommends that you continue on your current medications as directed. Please refer to the Current Medication list given to you today.  *If you need a refill on your cardiac medications before your next appointment, please call your pharmacy*   Testing/Procedures: Your physician has requested that you have an echocardiogram. Echocardiography is a painless test that uses sound waves to create images of your heart. It provides your doctor with information about the size and shape of your heart and how well your heart's chambers and valves are working. This procedure takes approximately one hour. There are no restrictions for this procedure. Please do NOT wear cologne, perfume, aftershave, or lotions (deodorant is allowed). Please arrive 15 minutes prior to your appointment time. This procedure will be done at [address]   Follow-Up: At [facility], you and your health needs are our priority.  As part of our continuing mission to provide you with exceptional heart care, we have created designated Provider Care Teams.  These Care Teams include your primary Cardiologist (physician) and Advanced Practice Providers (APPs -  Physician Assistants and Nurse Practitioners) who all work together to provide you with the care you need, when you need it.  We recommend signing up for the patient portal called "MyChart".  Sign up information is provided on this After Visit Summary.  MyChart is used to connect with patients for Virtual Visits (Telemedicine).  Patients are able to view lab/test results, encounter notes, upcoming appointments, etc.  Non-urgent messages can be sent to your provider as well.   To learn more about what you can do with MyChart, go to [email or web address].    Your next appointment:   6 month(s)  The format for your next appointment:   In Person  Provider:   K. Mali Hilty, MD

## 2022-03-15 NOTE — Progress Notes (Signed)
OFFICE NOTE  Chief Complaint:  Follow-up mechanical aortic valve  Primary Care Physician: Pcp, No  HPI:  Alex [name] is a 41 y.o. 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  Acute renal failure, cerebral embolism and infarction, A-fib with RVR and other medical problems.  He underwent mechanical AVR as well as Bentall procedure with a 23 mm on-X aortic valve and 4 x 4 cm PeriGuard aortic root grafting by Dr. Kipp Brood on Aug 03, 2020.  Been back in follow-up in July 2022 and doing well.  His INRs have been therapeutic.  He already apparently has had dental work and did take amoxicillin prior to that.  We will make sure that that is ordered and available and he was instructed the importance of that.  In addition guidelines recommend low-dose aspirin on top of warfarin for an On-X valve replacement. Since he had stroke and afib associated with his valve disease, target INR is 2.5.  Otherwise he reports doing well.  He had an episode of chest pain which was sharp and positional that lasted for for 5 days but is now resolved.  It was not associated with exertion or relieved by rest.  03/15/2022  Alex [name] is seen today in follow-up.  He has had some difficulty managing his INRs.  They are followed by our anticoagulation nurse up in [place].  He denies any chest pain or shortness of breath.  He was supposed to get a baseline echo but that was never performed.  I would like to reassess that to establish gradients across the valve. I discussed with our pharmacists as to why his INRs are so labile and one explanation could be substance use. He has a history of this. When I asked him he denied it and was somewhat defensive. We offered to check his INR and then he said he needed to leave abruptly.  PMHx:  Past Medical History:  Diagnosis Date   Atrial fibrillation ([place])    Ruptured lumbar disc     Past Surgical History:   Procedure Laterality Date   AORTIC/RENAL BYPASS     BENTALL PROCEDURE N/A 08/03/2020   Procedure: BENTALL PROCEDURE USING ON-X AORTIC VALVE SIZE 23 MM AND PERI-GUARD 4CM X 4CM PERICARDIUM;  Surgeon: Lajuana Matte, MD;  Location: [facility];  Service: Open Heart Surgery;  Laterality: N/A;   IR FLUORO GUIDE CV LINE RIGHT  08/01/2020   IR REMOVAL TUN CV CATH W/O FL  08/29/2020   IR US GUIDE VASC ACCESS RIGHT  08/01/2020   TEE WITHOUT CARDIOVERSION N/A 08/03/2020   Procedure: TRANSESOPHAGEAL ECHOCARDIOGRAM (TEE);  Surgeon: Lajuana Matte, MD;  Location: [facility];  Service: Open Heart Surgery;  Laterality: N/A;    FAMHx:  Family History  Problem Relation Age of Onset   Diabetes Mother    Heart failure Father    Pneumonia Father    Kidney Stones Brother    Cancer Maternal Grandmother    Cancer Maternal Grandfather    Cancer Paternal Grandmother     SOCHx:   reports that he has been smoking cigarettes. He has been smoking an average of 1 pack per day. He has never used smokeless tobacco. He reports that he does not currently use drugs after having used the following drugs: Marijuana. He reports that he does not drink alcohol.  ALLERGIES:  No Known Allergies  ROS: Pertinent items noted in HPI and  remainder of comprehensive ROS otherwise negative.  HOME MEDS: Current Outpatient Medications on File Prior to Visit  Medication Sig Dispense Refill   aspirin EC 81 MG tablet Take 1 tablet (81 mg total) by mouth daily. Swallow whole. 90 tablet 3   diltiazem (CARDIZEM CD) 180 MG 24 hr capsule Take 1 capsule (180 mg total) by mouth daily. 30 capsule 0   FERREX 150 150 MG capsule Take 150 mg by mouth daily.     losartan (COZAAR) 50 MG tablet Take 50 mg by mouth at bedtime.     senna-docusate (SENOKOT-S) 8.6-50 MG tablet Take 1 tablet by mouth 2 (two) times daily. 60 tablet 0   warfarin (COUMADIN) 5 MG tablet Take 1 1/2 to 2 tablets daily or as directed 60 tablet 5   amoxicillin (AMOXIL) 500 MG  capsule Take 4 tablets (2,000 mg total) 2 hours prior to dental procedures (Patient not taking: Reported on 03/15/2022) 4 capsule 3   buprenorphine-naloxone (SUBOXONE) 8-2 mg SUBL SL tablet Place 1 tablet under the tongue See admin instructions. Place one tablet under the tongue twice daily. May place additional one-half tablet under tongue as needed for symptoms of withdrawal. (Patient not taking: Reported on 03/15/2022)     No current facility-administered medications on file prior to visit.    LABS/IMAGING: No results found for this or any previous visit (from the past 48 hour(s)). No results found.  LIPID PANEL:    Component Value Date/Time   CHOL 138 07/25/2020 0334   TRIG 500 (H) 07/28/2020 0727   HDL <10 (L) 07/25/2020 0334   CHOLHDL UNABLE TO CALCULATE. 07/25/2020 0334   VLDL 70 (H) 07/25/2020 0334   LDLCALC NOT CALCULATED 07/25/2020 0334   LDLDIRECT 57.1 07/26/2020 0331     WEIGHTS: Wt Readings from Last 3 Encounters:  03/15/22 287 lb 7.2 oz (130.4 kg)  05/15/21 289 lb (131.1 kg)  11/13/20 259 lb 6.4 oz (117.7 kg)    VITALS: BP (!) 149/95 (BP Location: Left Arm, Patient Position: Sitting, Cuff Size: Large)   Pulse 89   Ht 6\' 4"  (1.93 m)   Wt 287 lb 7.2 oz (130.4 kg)   SpO2 99%   BMI 34.99 kg/m   EXAM: General appearance: alert and no distress Neck: no carotid bruit, no JVD, and thyroid not enlarged, symmetric, no tenderness/mass/nodules Lungs: clear to auscultation bilaterally Heart: regular rate and rhythm, S1, S2 normal, and sharp mechanical valve sound heard Abdomen: soft, non-tender; bowel sounds normal; no masses,  no organomegaly Extremities: extremities normal, atraumatic, no cyanosis or edema Pulses: 2+ and symmetric Skin: Skin color, texture, turgor normal. 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   Alex [name] recently has had issues with labile INRs.  There is a concern for possible substance use although he denied that.  He reports a stable diet.  It has been difficult for the anticoagulation nurse to control him.  I would shoot for INR is between 2-1/2 and 3 and recommend he stay on aspirin as well.  He denies any recurrent A-fib.  EKG shows normal sinus rhythm today.  We offered an INR in the office but  he declined this and left abruptly.  I had recommended an outpatient echo.  Plan follow-up annually.  Pixie Casino, MD, [facility], [place] Director of the Advanced Lipid Disorders &  Cardiovascular Risk Reduction Clinic Diplomate of the American Board of Clinical Lipidology Attending Cardiologist  Direct Dial: [phone]  Fax: [phone]  Website:  [email or web address] [email or web address] Grime 03/15/2022, 4:11 PM

## 2022-03-29 ENCOUNTER — Telehealth: Payer: Self-pay | Admitting: Internal Medicine

## 2022-03-29 NOTE — Telephone Encounter (Signed)
Alex Fetters, RN  P Cv Div Nl Scheduling Please call pt to set up Echocardiogram appointment at [facility]. There was a line at check out and pt did not want to stay.  Thank you! [name]  Patient contacted 3x with no success in scheduling echo

## 2022-04-04 ENCOUNTER — Ambulatory Visit: Payer: 59 | Attending: Cardiology | Admitting: *Deleted

## 2022-04-04 DIAGNOSIS — I63413 Cerebral infarction due to embolism of bilateral middle cerebral arteries: Secondary | ICD-10-CM | POA: Diagnosis not present

## 2022-04-04 DIAGNOSIS — I4891 Unspecified atrial fibrillation: Secondary | ICD-10-CM

## 2022-04-04 DIAGNOSIS — Z5181 Encounter for therapeutic drug level monitoring: Secondary | ICD-10-CM | POA: Diagnosis not present

## 2022-04-04 LAB — POCT INR: INR: 1.7 — AB (ref 2.0–3.0)

## 2022-04-04 NOTE — Patient Instructions (Signed)
Take warfarin 3 tablets tonight and tomorrow night then resume 2 tablets daily Recheck in 2 wks

## 2022-04-18 ENCOUNTER — Ambulatory Visit: Payer: 59 | Attending: Internal Medicine | Admitting: *Deleted

## 2022-04-18 DIAGNOSIS — I63413 Cerebral infarction due to embolism of bilateral middle cerebral arteries: Secondary | ICD-10-CM | POA: Diagnosis not present

## 2022-04-18 DIAGNOSIS — Z5181 Encounter for therapeutic drug level monitoring: Secondary | ICD-10-CM

## 2022-04-18 DIAGNOSIS — I4891 Unspecified atrial fibrillation: Secondary | ICD-10-CM | POA: Diagnosis not present

## 2022-04-18 LAB — POCT INR: INR: 3.4 — AB (ref 2.0–3.0)

## 2022-04-18 NOTE — Patient Instructions (Addendum)
Continue warfarin 2 tablets daily Recheck in 3 wks

## 2022-05-09 ENCOUNTER — Ambulatory Visit: Payer: 59 | Attending: Cardiology | Admitting: *Deleted

## 2022-05-09 DIAGNOSIS — Z5181 Encounter for therapeutic drug level monitoring: Secondary | ICD-10-CM

## 2022-05-09 DIAGNOSIS — I63413 Cerebral infarction due to embolism of bilateral middle cerebral arteries: Secondary | ICD-10-CM

## 2022-05-09 DIAGNOSIS — I4891 Unspecified atrial fibrillation: Secondary | ICD-10-CM | POA: Diagnosis not present

## 2022-05-09 LAB — POCT INR: INR: 1.2 — AB (ref 2.0–3.0)

## 2022-05-09 NOTE — Patient Instructions (Signed)
Take warfarin 3 tablets x 3 days then resume 2 tablets daily Recheck in 2 wks

## 2022-05-23 ENCOUNTER — Telehealth: Payer: Self-pay | Admitting: *Deleted

## 2022-05-23 NOTE — Telephone Encounter (Signed)
Pt missed Coumadin Appointment today. Called pt and LMOM for him to call back to make appt.

## 2022-06-17 IMAGING — DX DG CHEST 1V
1 series · 1 of 1 positions shown · non-contrast
Comparison: Earlier radiograph dated 07/18/2020.

CLINICAL DATA: 39-year-old male with central line placement.

EXAM:
CHEST  1 VIEW

[chest]
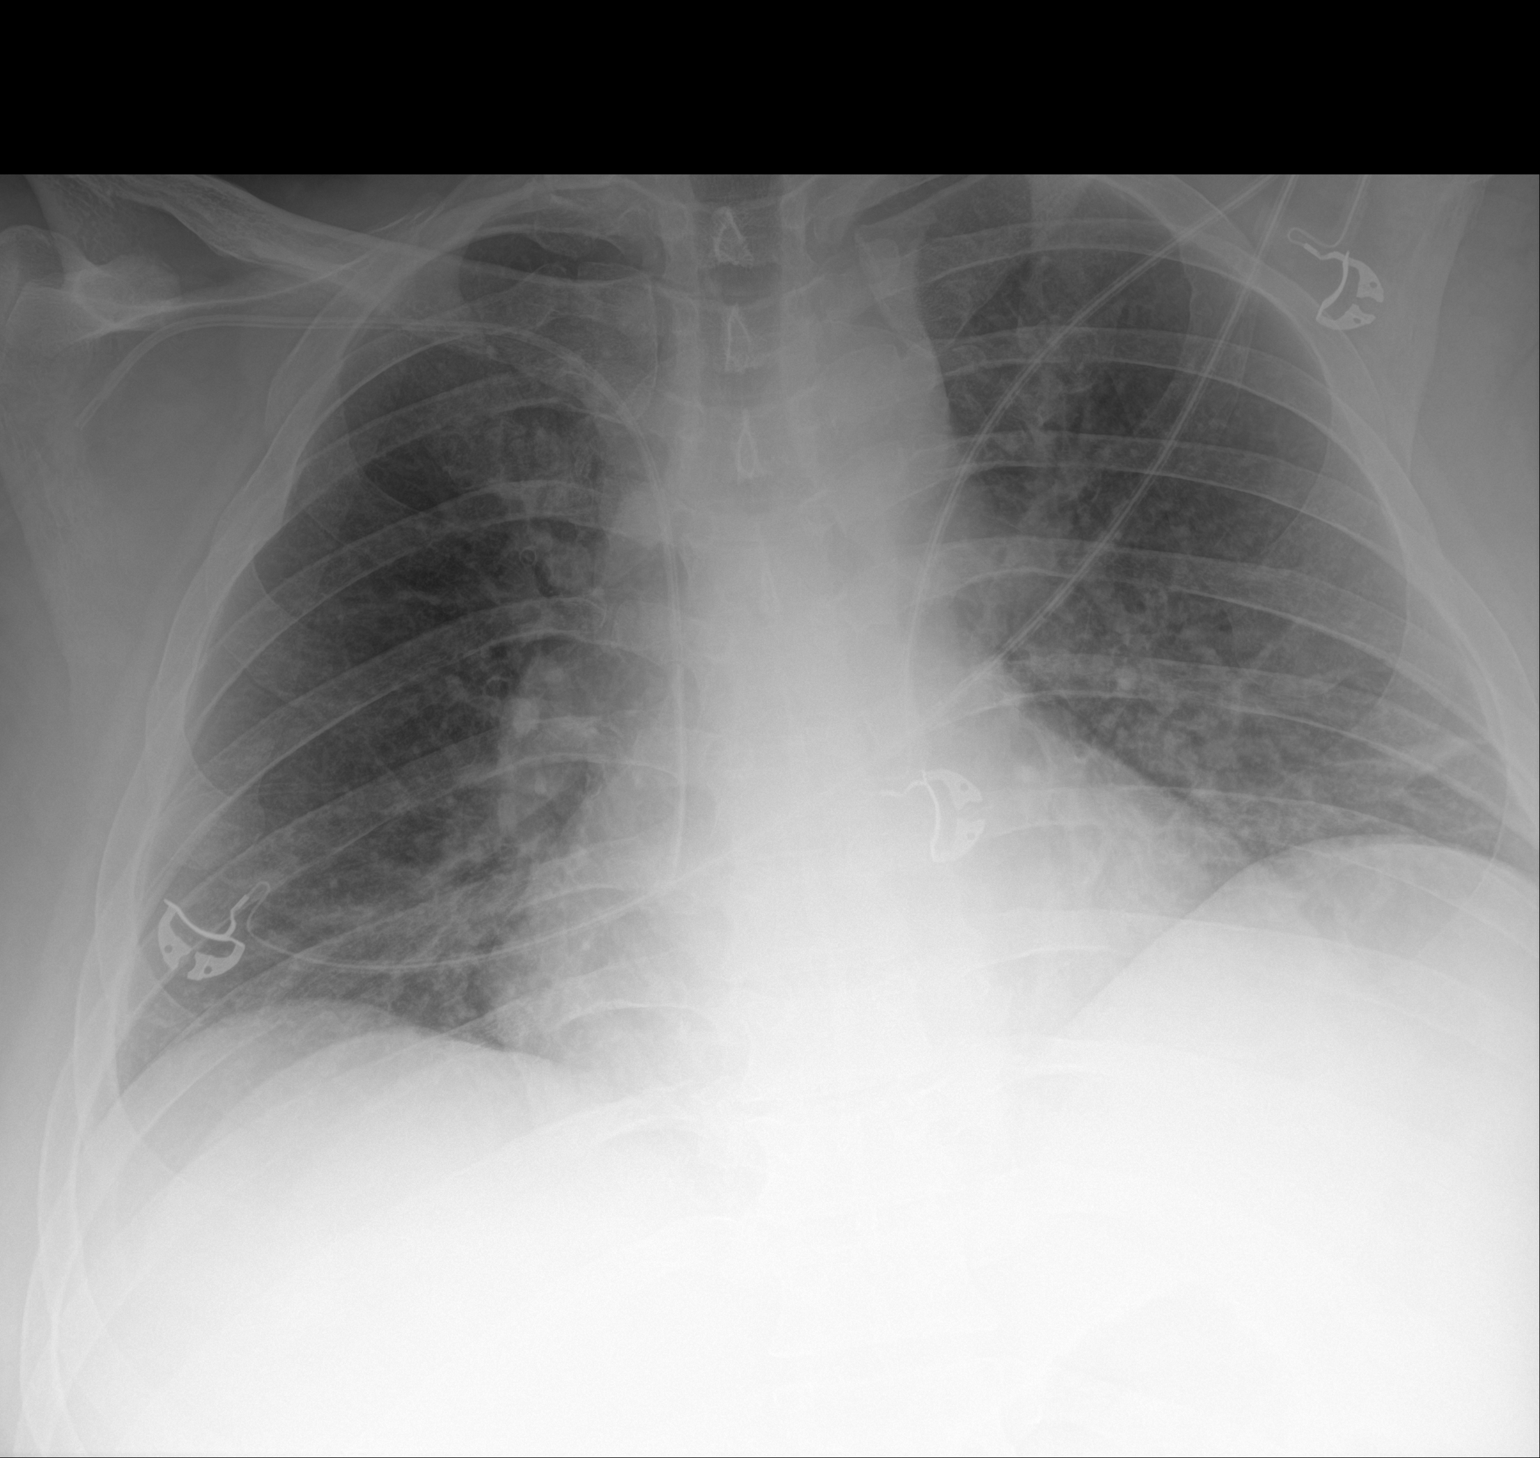

[1 of 1 positions shown; findings below may reference images not displayed]

FINDINGS: Right subclavian central venous line with tip over the cavoatrial
junction. No pneumothorax. There is mild cardiomegaly with mild
vascular congestion. No focal consolidation, pleural effusion, or
pneumothorax. No acute osseous pathology.
IMPRESSION: Interval placement of a right subclavian central venous line with
tip over the cavoatrial junction. No pneumothorax.

## 2022-06-17 IMAGING — DX DG CHEST 1V PORT
1 series · 1 of 1 positions shown · non-contrast
Comparison: None.

CLINICAL DATA: Fever

EXAM:
PORTABLE CHEST 1 VIEW

[chest ap]
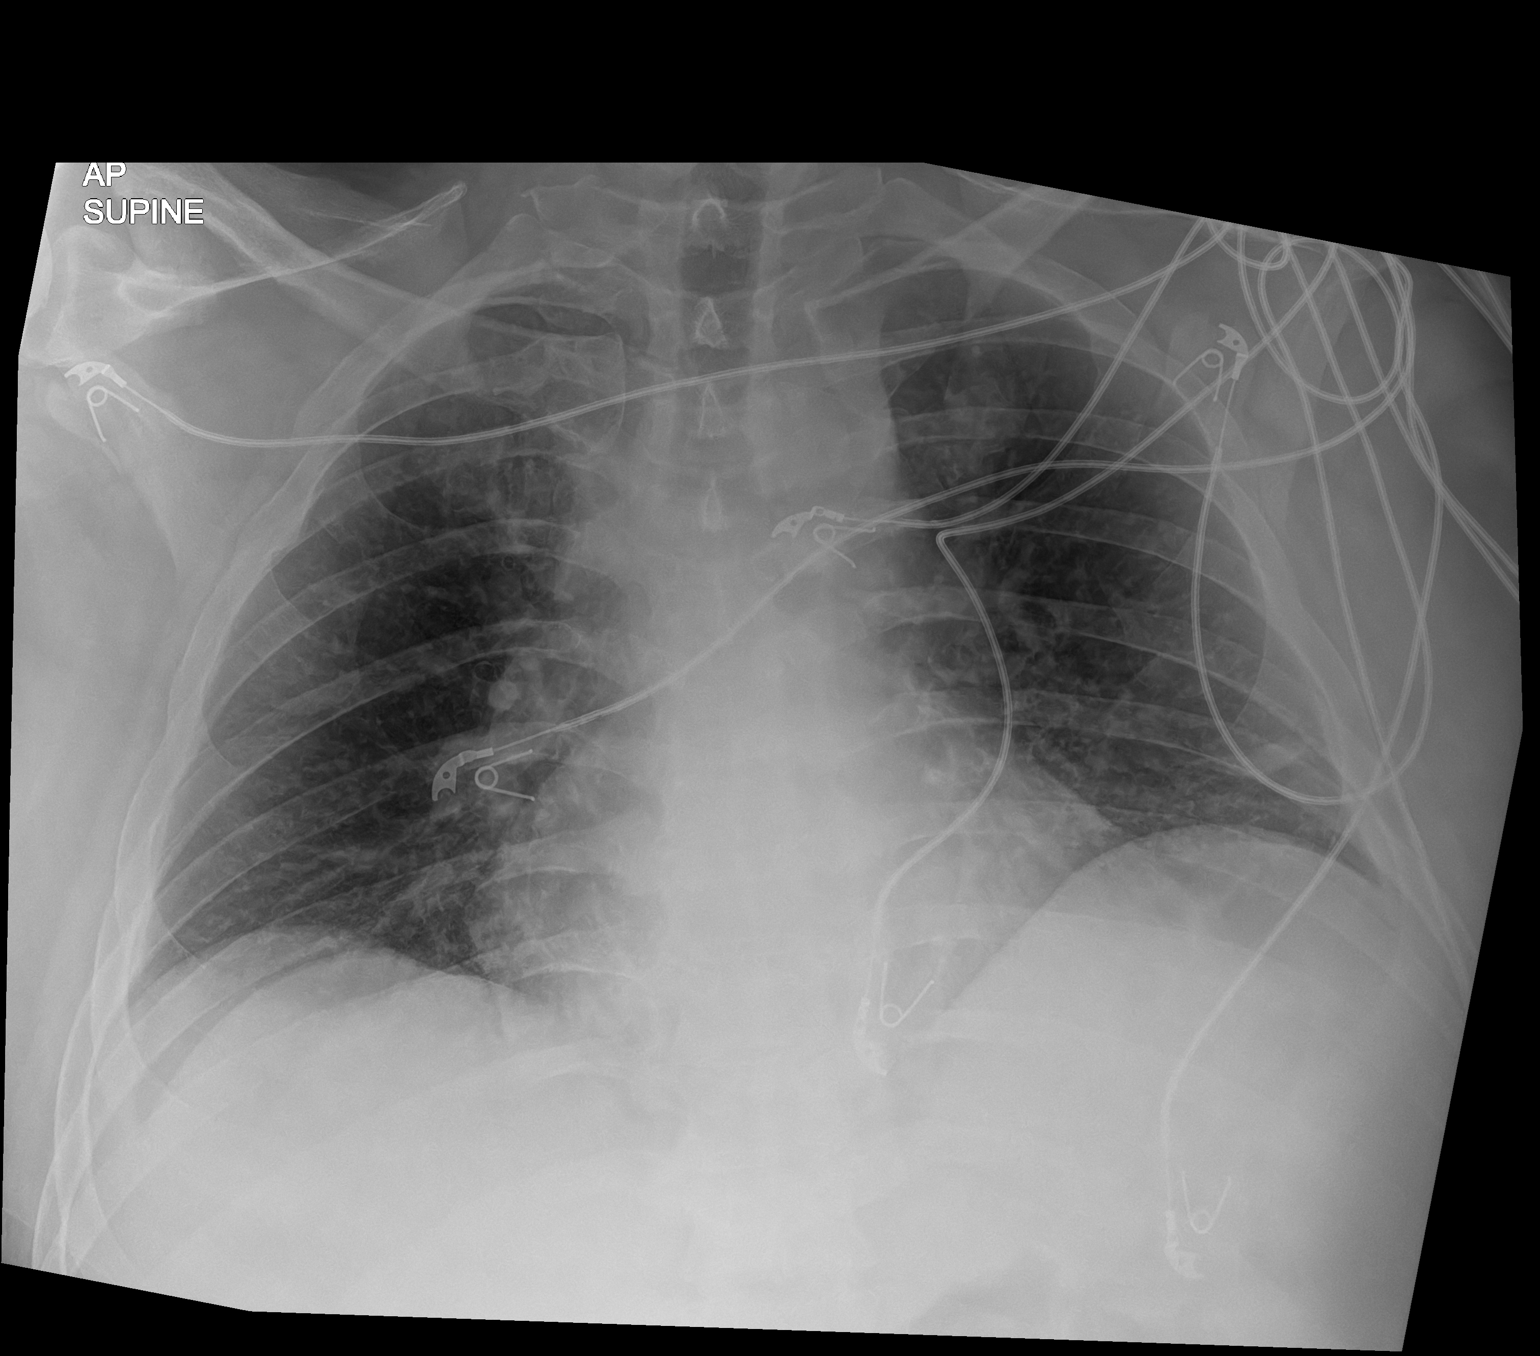

[1 of 1 positions shown; findings below may reference images not displayed]

FINDINGS: Lung volumes are small, but are symmetric and are clear. No
pneumothorax or pleural effusion. Cardiac size within normal limits.
Pulmonary vascularity is normal. No acute bone abnormality.
IMPRESSION: Pulmonary hypoinflation.

## 2022-06-17 IMAGING — US US RENAL
2 series · 14 of 25 positions shown · non-contrast
Comparison: Noncontrast CT earlier today.

CLINICAL DATA: Acute kidney injury.

EXAM:
RENAL / URINARY TRACT ULTRASOUND COMPLETE

[Series 1: us renal · 12 of 69 slices shown]
[im 1/69]
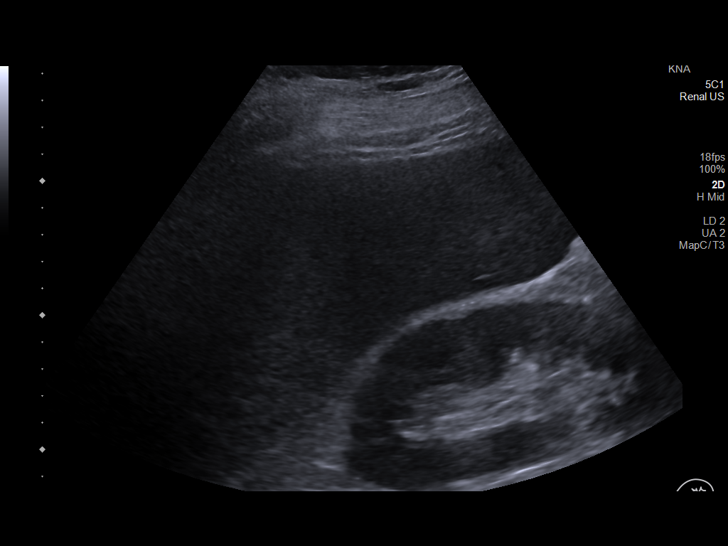
[im 7/69]
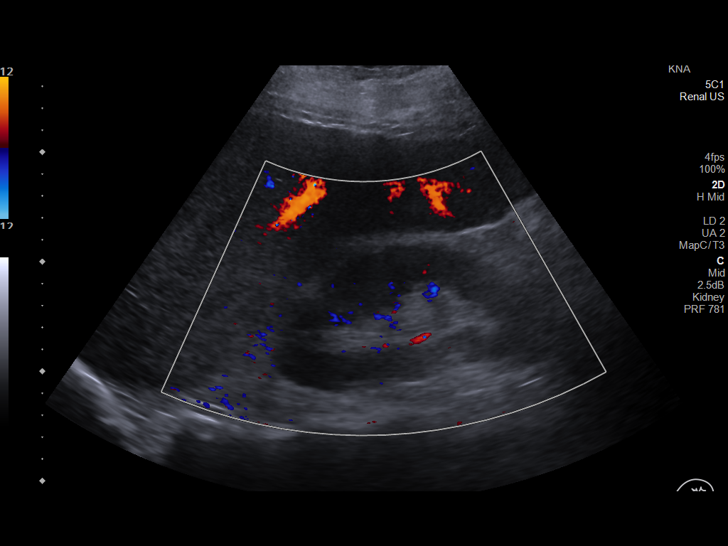
[im 14/69]
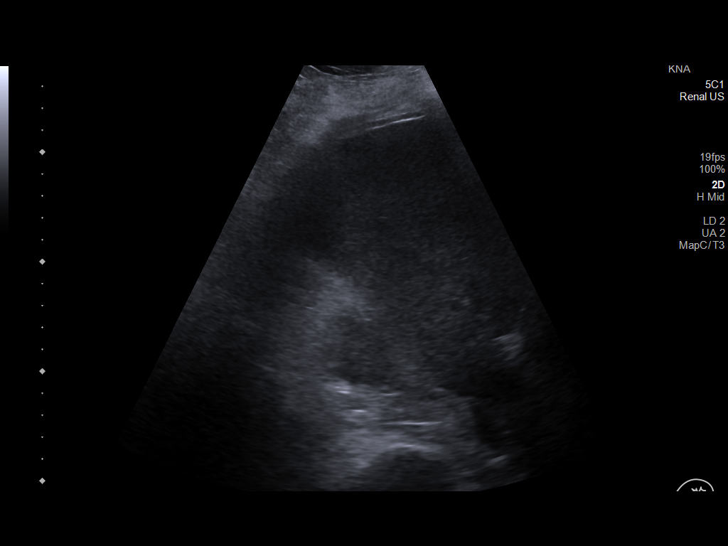
[im 21/69]
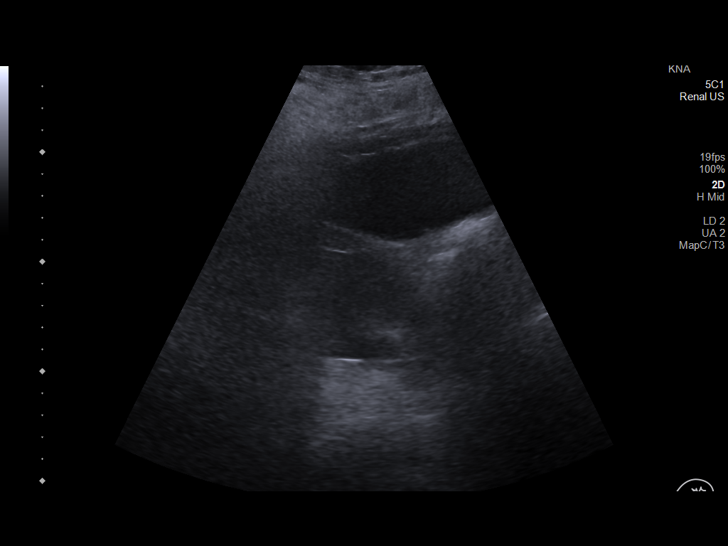
[im 28/69]
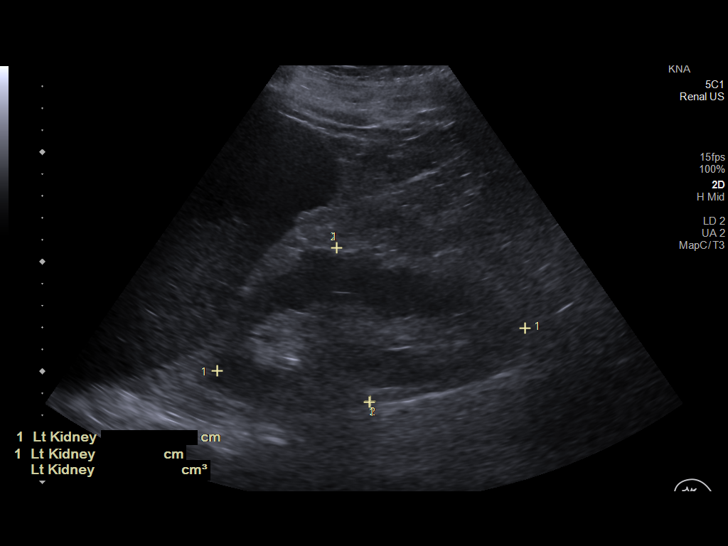
[im 31/69]
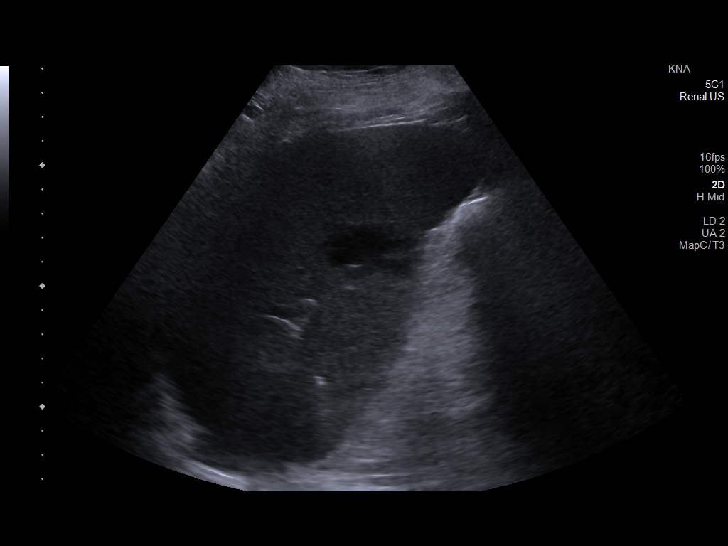
[im 38/69]
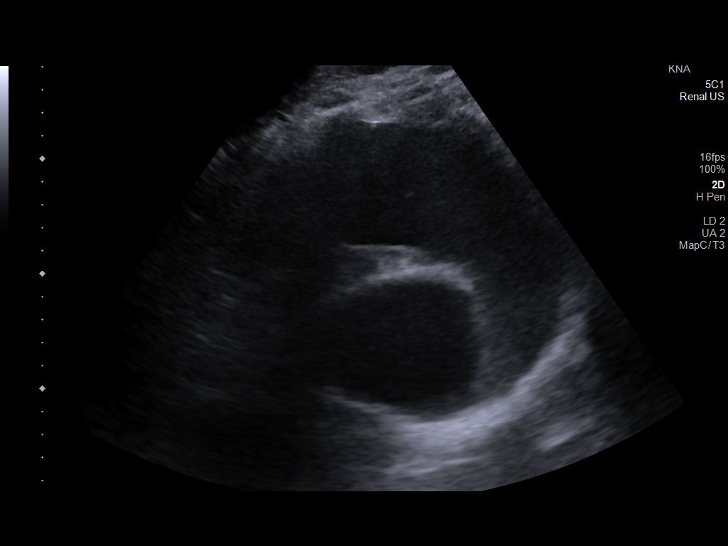
[im 45/69]
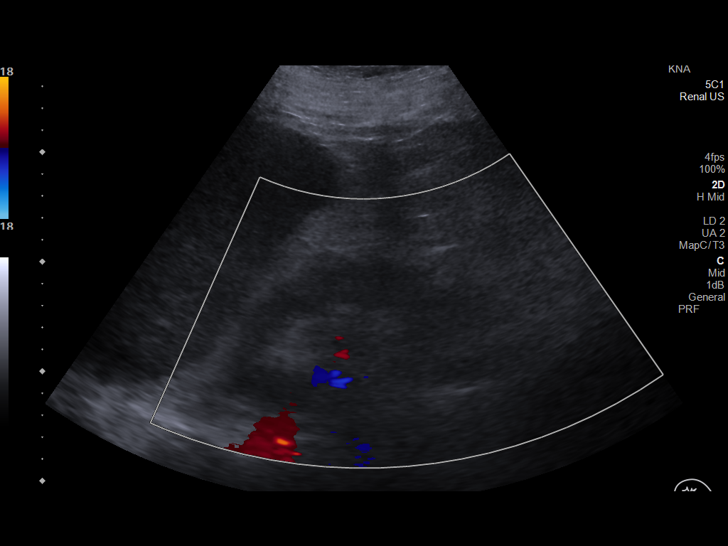
[im 52/69]
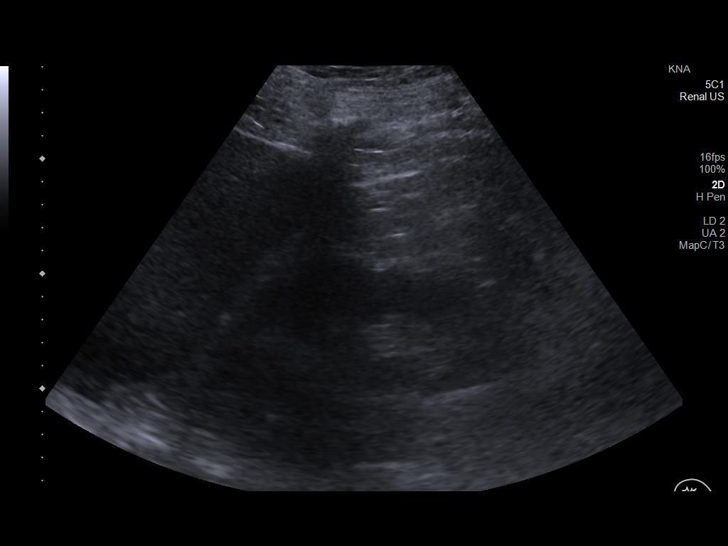
[im 55/69]
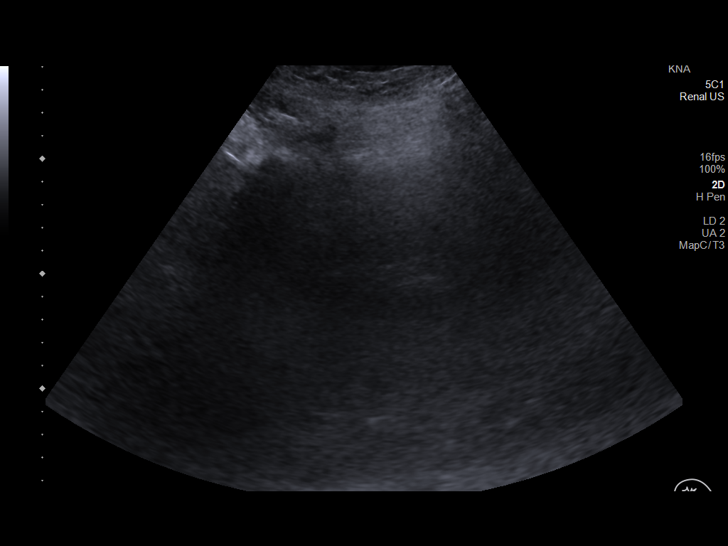
[im 62/69]
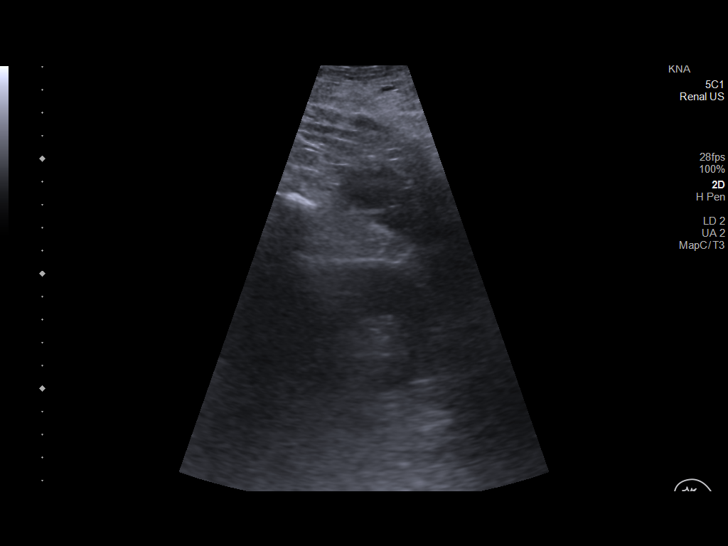
[im 69/69]
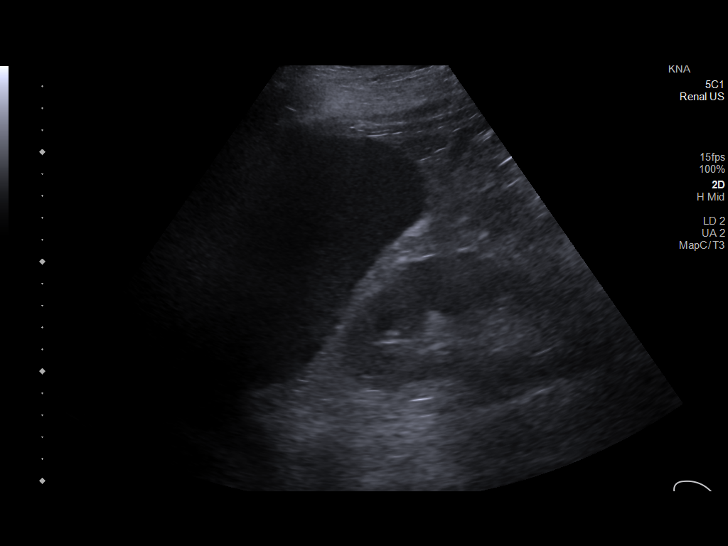

[Series 1001: renal us · 2 of 13 slices shown]
[im 5/13]
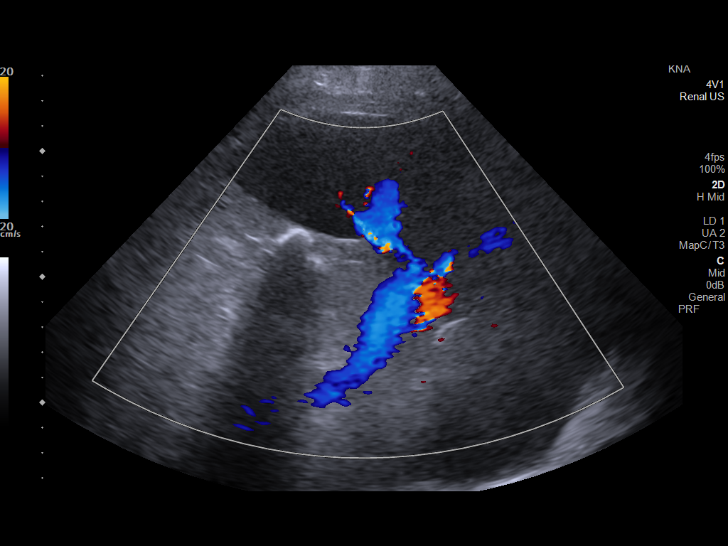
[im 13/13]
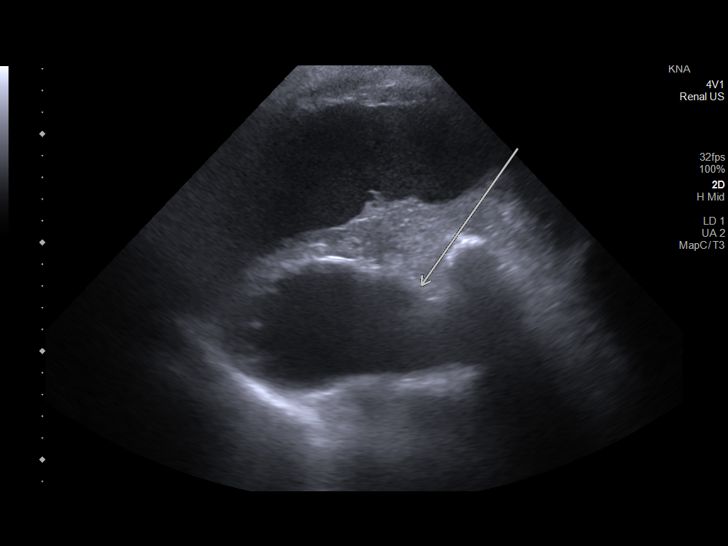

[14 of 25 positions shown; findings below may reference images not displayed]

FINDINGS: Right Kidney:

Renal measurements: 13.1 x 6.5 x 6.4 cm = volume: 281 mL.
Echogenicity within normal limits. No hydronephrosis. No focal renal
lesion or stone.

Left Kidney:

Renal measurements: 14.2 x 7.2 x 6.3 cm = volume: 338 mL.
Echogenicity within normal limits. No hydronephrosis. No focal renal
lesion. Nonobstructing intrarenal stone on CT earlier today is not
visualized.

Bladder:

Decompressed by Foley catheter.

Other:

Splenomegaly as seen on CT earlier today. Cystic lesion at the hilum
measures 3.3 cm. What is labeled by the technologist as incidental
spleen represents bowel. Technically challenging exam due to
habitus.
IMPRESSION: 1. No obstructive uropathy.
2. Punctate nonobstructing left renal stone on CT earlier today is
not seen by ultrasound.

## 2022-06-21 IMAGING — DX DG CHEST 1V PORT
1 series · 1 of 1 positions shown · non-contrast
Comparison: July 20, 2020

CLINICAL DATA: Respiratory failure

EXAM:
PORTABLE CHEST 1 VIEW

[chest ap]
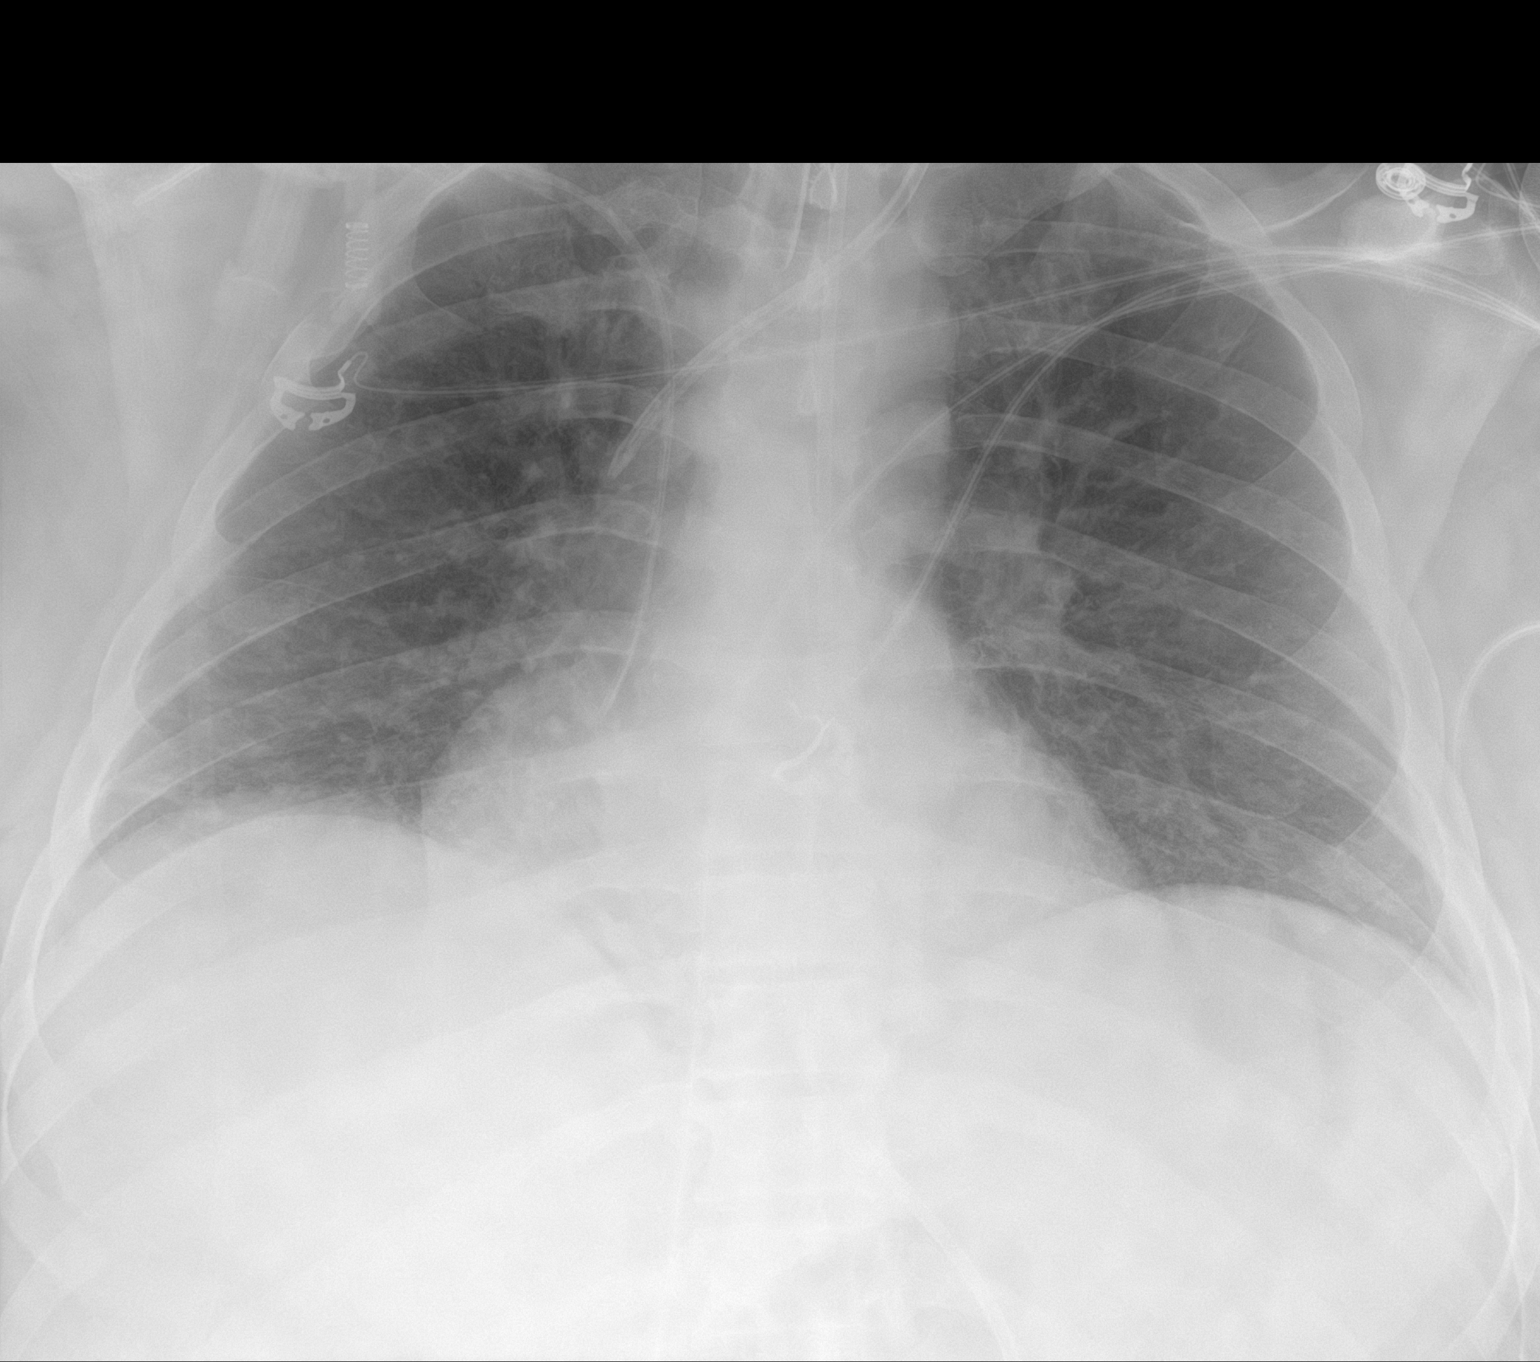

[1 of 1 positions shown; findings below may reference images not displayed]

FINDINGS: The ET tube and left central line are in good position. A right
central line is in good position. The feeding tube terminates below
today's film. Cardiomediastinal silhouette is stable. No pulmonary
nodules or masses. No focal infiltrates. No overt edema.
IMPRESSION: 1. Support apparatus as above.
2. No other acute abnormalities.

## 2022-06-26 ENCOUNTER — Ambulatory Visit: Payer: 59 | Attending: Cardiology | Admitting: *Deleted

## 2022-06-26 DIAGNOSIS — I63413 Cerebral infarction due to embolism of bilateral middle cerebral arteries: Secondary | ICD-10-CM | POA: Diagnosis not present

## 2022-06-26 DIAGNOSIS — I4891 Unspecified atrial fibrillation: Secondary | ICD-10-CM | POA: Diagnosis not present

## 2022-06-26 DIAGNOSIS — Z5181 Encounter for therapeutic drug level monitoring: Secondary | ICD-10-CM | POA: Diagnosis not present

## 2022-06-26 LAB — POCT INR: INR: 1.1 — AB (ref 2.0–3.0)

## 2022-06-26 NOTE — Patient Instructions (Signed)
Take warfarin 3 tablets tonight then resume 2 tablets daily Pending root canal tomorrow.  Has Rx for amoxicillin SBE protocol Recheck in 2 wks

## 2022-07-16 ENCOUNTER — Encounter: Payer: Self-pay | Admitting: *Deleted

## 2022-07-16 ENCOUNTER — Other Ambulatory Visit: Payer: Self-pay | Admitting: Cardiology

## 2022-08-01 ENCOUNTER — Ambulatory Visit: Payer: 59 | Attending: Cardiology | Admitting: *Deleted

## 2022-08-01 DIAGNOSIS — I4891 Unspecified atrial fibrillation: Secondary | ICD-10-CM | POA: Diagnosis not present

## 2022-08-01 DIAGNOSIS — Z5181 Encounter for therapeutic drug level monitoring: Secondary | ICD-10-CM | POA: Diagnosis not present

## 2022-08-01 DIAGNOSIS — I63413 Cerebral infarction due to embolism of bilateral middle cerebral arteries: Secondary | ICD-10-CM | POA: Diagnosis not present

## 2022-08-01 LAB — POCT INR: INR: 1.2 — AB (ref 2.0–3.0)

## 2022-08-01 NOTE — Patient Instructions (Signed)
Take warfarin 4 tablets tonight then increase dose to 3 tablets daily except 2 tablets on Mondays, Wednesdays and Fridays Recheck in 2 wks

## 2022-08-22 ENCOUNTER — Ambulatory Visit: Payer: 59 | Attending: Cardiology | Admitting: *Deleted

## 2022-08-22 DIAGNOSIS — I4891 Unspecified atrial fibrillation: Secondary | ICD-10-CM | POA: Diagnosis not present

## 2022-08-22 DIAGNOSIS — Z5181 Encounter for therapeutic drug level monitoring: Secondary | ICD-10-CM | POA: Diagnosis not present

## 2022-08-22 DIAGNOSIS — I63413 Cerebral infarction due to embolism of bilateral middle cerebral arteries: Secondary | ICD-10-CM

## 2022-08-22 LAB — POCT INR: POC INR: 5.5

## 2022-08-22 NOTE — Patient Instructions (Addendum)
Description   Hold warfarin today and tomorrow and then START taking warfarin 2.5 tablets daily except for 2 tablets on Fridays. Recheck INR in 1 week.

## 2022-08-29 ENCOUNTER — Ambulatory Visit: Payer: 59 | Attending: Cardiology | Admitting: *Deleted

## 2022-08-29 DIAGNOSIS — I63413 Cerebral infarction due to embolism of bilateral middle cerebral arteries: Secondary | ICD-10-CM

## 2022-08-29 DIAGNOSIS — Z5181 Encounter for therapeutic drug level monitoring: Secondary | ICD-10-CM

## 2022-08-29 DIAGNOSIS — I4891 Unspecified atrial fibrillation: Secondary | ICD-10-CM

## 2022-08-29 LAB — POCT INR: INR: 4 — AB (ref 2.0–3.0)

## 2022-08-29 NOTE — Patient Instructions (Signed)
Hold warfarin today then decrease dose to 2.5 tablets daily except for 2 tablets on Tuesdays and Fridays. Recheck INR in 2 week.

## 2022-09-11 ENCOUNTER — Other Ambulatory Visit: Payer: Self-pay

## 2022-09-11 ENCOUNTER — Ambulatory Visit: Payer: 59 | Attending: Cardiology | Admitting: *Deleted

## 2022-09-11 DIAGNOSIS — I63413 Cerebral infarction due to embolism of bilateral middle cerebral arteries: Secondary | ICD-10-CM | POA: Diagnosis not present

## 2022-09-11 DIAGNOSIS — Z5181 Encounter for therapeutic drug level monitoring: Secondary | ICD-10-CM | POA: Diagnosis not present

## 2022-09-11 DIAGNOSIS — I4891 Unspecified atrial fibrillation: Secondary | ICD-10-CM

## 2022-09-11 LAB — POCT INR: INR: 3.6 — AB (ref 2.0–3.0)

## 2022-09-11 MED ORDER — WARFARIN SODIUM 5 MG PO TABS: ORAL_TABLET | ORAL | 0 refills | Status: DC

## 2022-09-11 NOTE — Patient Instructions (Signed)
Take warfarin 2 tablets tonight then resume 2.5 tablets daily except for 2 tablets on Tuesdays and Fridays. Recheck INR in 3 weeks

## 2022-09-11 NOTE — Telephone Encounter (Signed)
Prescription refill request received for warfarin Lov: 02/23/22 (Hilty)  Next INR check: 09/11/22 Warfarin tablet strength: 5mg   Appropriate dose. Refill sent.

## 2022-10-14 ENCOUNTER — Other Ambulatory Visit: Payer: Self-pay

## 2022-10-14 DIAGNOSIS — I4891 Unspecified atrial fibrillation: Secondary | ICD-10-CM

## 2022-10-14 MED ORDER — WARFARIN SODIUM 5 MG PO TABS
ORAL_TABLET | ORAL | 0 refills | Status: DC
Start: 2022-10-14 — End: 2022-12-27

## 2022-10-23 ENCOUNTER — Ambulatory Visit: Payer: 59 | Attending: Cardiology | Admitting: *Deleted

## 2022-10-23 DIAGNOSIS — I63413 Cerebral infarction due to embolism of bilateral middle cerebral arteries: Secondary | ICD-10-CM

## 2022-10-23 DIAGNOSIS — Z5181 Encounter for therapeutic drug level monitoring: Secondary | ICD-10-CM | POA: Diagnosis not present

## 2022-10-23 DIAGNOSIS — I4891 Unspecified atrial fibrillation: Secondary | ICD-10-CM | POA: Diagnosis not present

## 2022-10-23 LAB — POCT INR: INR: 1 — AB (ref 2.0–3.0)

## 2022-10-23 NOTE — Patient Instructions (Signed)
Take warfarin 3 tablets tonight and tomorrow night then resume 2.5 tablets daily except for 2 tablets on Tuesdays and Fridays. Recheck INR in 2 weeks

## 2022-11-06 ENCOUNTER — Ambulatory Visit: Payer: 59 | Attending: Cardiology | Admitting: *Deleted

## 2022-11-06 DIAGNOSIS — I63413 Cerebral infarction due to embolism of bilateral middle cerebral arteries: Secondary | ICD-10-CM

## 2022-11-06 DIAGNOSIS — Z5181 Encounter for therapeutic drug level monitoring: Secondary | ICD-10-CM

## 2022-11-06 DIAGNOSIS — I4891 Unspecified atrial fibrillation: Secondary | ICD-10-CM | POA: Diagnosis not present

## 2022-11-06 LAB — POCT INR: INR: 2.6 (ref 2.0–3.0)

## 2022-11-06 NOTE — Patient Instructions (Signed)
Increase warfarin to 2 1/2 tablets daily Recheck INR in 4 weeks

## 2022-11-28 ENCOUNTER — Ambulatory Visit: Payer: 59 | Attending: Cardiology | Admitting: *Deleted

## 2022-11-28 DIAGNOSIS — Z5181 Encounter for therapeutic drug level monitoring: Secondary | ICD-10-CM

## 2022-11-28 DIAGNOSIS — I4891 Unspecified atrial fibrillation: Secondary | ICD-10-CM

## 2022-11-28 DIAGNOSIS — I63413 Cerebral infarction due to embolism of bilateral middle cerebral arteries: Secondary | ICD-10-CM

## 2022-11-28 LAB — POCT INR: INR: 4 — AB (ref 2.0–3.0)

## 2022-11-28 NOTE — Patient Instructions (Signed)
Hold warfarin tonight then resume 2 1/2 tablets daily Recheck INR in 3 weeks

## 2022-12-19 ENCOUNTER — Ambulatory Visit: Payer: 59 | Attending: Cardiology | Admitting: *Deleted

## 2022-12-19 DIAGNOSIS — I4891 Unspecified atrial fibrillation: Secondary | ICD-10-CM

## 2022-12-19 DIAGNOSIS — Z5181 Encounter for therapeutic drug level monitoring: Secondary | ICD-10-CM

## 2022-12-19 DIAGNOSIS — I63413 Cerebral infarction due to embolism of bilateral middle cerebral arteries: Secondary | ICD-10-CM | POA: Diagnosis not present

## 2022-12-19 LAB — POCT INR: INR: 4.2 — AB (ref 2.0–3.0)

## 2022-12-19 NOTE — Patient Instructions (Signed)
Hold warfarin tonight then decrease dose to 2 1/2 tablets daily except 2 tablets on Tuesdays and Fridays Recheck INR in 3 weeks

## 2022-12-26 ENCOUNTER — Other Ambulatory Visit: Payer: Self-pay | Admitting: Cardiology

## 2022-12-26 DIAGNOSIS — I4891 Unspecified atrial fibrillation: Secondary | ICD-10-CM

## 2023-01-08 ENCOUNTER — Encounter (HOSPITAL_COMMUNITY): Payer: Self-pay | Admitting: Emergency Medicine

## 2023-01-08 ENCOUNTER — Emergency Department (HOSPITAL_COMMUNITY): Payer: 59

## 2023-01-08 ENCOUNTER — Emergency Department (HOSPITAL_COMMUNITY)
Admission: EM | Admit: 2023-01-08 | Discharge: 2023-01-08 | Disposition: A | Discharge status: Home / Self Care | Payer: 59 | Attending: Emergency Medicine | Admitting: Emergency Medicine

## 2023-01-08 ENCOUNTER — Ambulatory Visit: Payer: 59

## 2023-01-08 ENCOUNTER — Other Ambulatory Visit: Payer: Self-pay

## 2023-01-08 DIAGNOSIS — R911 Solitary pulmonary nodule: Secondary | ICD-10-CM | POA: Insufficient documentation

## 2023-01-08 DIAGNOSIS — R202 Paresthesia of skin: Secondary | ICD-10-CM | POA: Insufficient documentation

## 2023-01-08 DIAGNOSIS — T148XXA Other injury of unspecified body region, initial encounter: Secondary | ICD-10-CM

## 2023-01-08 DIAGNOSIS — S299XXA Unspecified injury of thorax, initial encounter: Secondary | ICD-10-CM | POA: Diagnosis present

## 2023-01-08 DIAGNOSIS — S20211A Contusion of right front wall of thorax, initial encounter: Secondary | ICD-10-CM | POA: Diagnosis not present

## 2023-01-08 DIAGNOSIS — Y990 Civilian activity done for income or pay: Secondary | ICD-10-CM | POA: Diagnosis not present

## 2023-01-08 DIAGNOSIS — Z7982 Long term (current) use of aspirin: Secondary | ICD-10-CM | POA: Insufficient documentation

## 2023-01-08 DIAGNOSIS — X500XXA Overexertion from strenuous movement or load, initial encounter: Secondary | ICD-10-CM | POA: Diagnosis not present

## 2023-01-08 DIAGNOSIS — Z7984 Long term (current) use of oral hypoglycemic drugs: Secondary | ICD-10-CM | POA: Insufficient documentation

## 2023-01-08 DIAGNOSIS — M79601 Pain in right arm: Secondary | ICD-10-CM | POA: Insufficient documentation

## 2023-01-08 DIAGNOSIS — Z91148 Patient's other noncompliance with medication regimen for other reason: Secondary | ICD-10-CM | POA: Diagnosis not present

## 2023-01-08 LAB — BASIC METABOLIC PANEL
Anion gap: 8 (ref 5–15)
BUN: 27 mg/dL — ABNORMAL HIGH (ref 6–20)
CO2: 24 mmol/L (ref 22–32)
Calcium: 9 mg/dL (ref 8.9–10.3)
Chloride: 102 mmol/L (ref 98–111)
Creatinine, Ser: 1.57 mg/dL — ABNORMAL HIGH (ref 0.61–1.24)
GFR, Estimated: 57 mL/min — ABNORMAL LOW (ref >60)
Glucose, Bld: 112 mg/dL — ABNORMAL HIGH (ref 70–99)
Potassium: 4 mmol/L (ref 3.5–5.1)
Sodium: 134 mmol/L — ABNORMAL LOW (ref 135–145)

## 2023-01-08 LAB — TYPE AND SCREEN
ABO/RH(D): O NEG
Antibody Screen: POSITIVE

## 2023-01-08 LAB — CBC WITH DIFFERENTIAL/PLATELET
Abs Immature Granulocytes: 0.04 10*3/uL (ref 0.00–0.07)
Basophils Absolute: 0 10*3/uL (ref 0.0–0.1)
Basophils Relative: 0 %
Eosinophils Absolute: 0.2 10*3/uL (ref 0.0–0.5)
Eosinophils Relative: 1 %
HCT: 41.2 % (ref 39.0–52.0)
Hemoglobin: 13.5 g/dL (ref 13.0–17.0)
Immature Granulocytes: 0 %
Lymphocytes Relative: 16 %
Lymphs Abs: 2.2 10*3/uL (ref 0.7–4.0)
MCH: 30.1 pg (ref 26.0–34.0)
MCHC: 32.8 g/dL (ref 30.0–36.0)
MCV: 91.8 fL (ref 80.0–100.0)
Monocytes Absolute: 1 10*3/uL (ref 0.1–1.0)
Monocytes Relative: 7 %
Neutro Abs: 10.3 10*3/uL — ABNORMAL HIGH (ref 1.7–7.7)
Neutrophils Relative %: 76 %
Platelets: 254 10*3/uL (ref 150–400)
RBC: 4.49 MIL/uL (ref 4.22–5.81)
RDW: 13.7 % (ref 11.5–15.5)
WBC: 13.6 10*3/uL — ABNORMAL HIGH (ref 4.0–10.5)
nRBC: 0 % (ref 0.0–0.2)

## 2023-01-08 LAB — PROTIME-INR
INR: 3.9 — ABNORMAL HIGH (ref 0.8–1.2)
Prothrombin Time: 38.8 s — ABNORMAL HIGH (ref 11.4–15.2)

## 2023-01-08 MED ORDER — IOHEXOL 300 MG/ML  SOLN
75.0000 mL | Freq: Once | INTRAMUSCULAR | Status: AC | PRN
  Administered 2023-01-08: 75 mL via INTRAVENOUS

## 2023-01-08 MED ORDER — FENTANYL CITRATE (PF) 100 MCG/2ML IJ SOLN
100.0000 ug | Freq: Once | INTRAMUSCULAR | Status: AC
  Administered 2023-01-08: 100 ug via INTRAVENOUS
  Filled 2023-01-08: qty 2

## 2023-01-08 NOTE — ED Triage Notes (Signed)
Pt reports he has a "mass" to his right chest with tenderness and swelling that started at work yesterday afternoon, pt reports it is causing right arm pain 8/10 with some tingling, denies injury, bruising to right chest, pt reports he is on Warfarin

## 2023-01-08 NOTE — ED Provider Notes (Signed)
[facility] EMERGENCY DEPARTMENT AT [facility] Provider Note   CSN: [id number] Arrival date & time: 01/08/23  5643     History  Chief Complaint  Patient presents with   Arm Pain    Alex [name] is a 42 y.o. male.  The history is provided by the patient and a parent.  Patient with extensive history including atrial fibrillation, previous CVA, history of aortic valve endocarditis requiring mechanical valve presents with right chest mass, pain and right arm pain.  Patient reports he has noticed bruising along the right side of his chest for the past week.  Since yesterday he started noticing pain and a firm mass in his right chest.  Denies any falls or trauma, but he does do heavy lifting at work.  He also reports his right arm hurts to move.  He also reports some numbness in his arm.  No focal weakness.  No left-sided chest pain or shortness of breath here.  He reports medication compliance with warfarin    Past Medical History:  Diagnosis Date   Atrial fibrillation (HCC)    Ruptured lumbar disc     Home Medications Prior to Admission medications   Medication Sig Start Date End Date Taking? Authorizing Provider  amoxicillin (AMOXIL) 500 MG capsule Take 4 tablets (2,000 mg total) 2 hours prior to dental procedures Patient not taking: Reported on 03/15/2022 05/15/21   Chrystie Nose, MD  aspirin EC 81 MG tablet Take 1 tablet (81 mg total) by mouth daily. Swallow whole. 05/15/21   Hilty, Lisette Abu, MD  buprenorphine-naloxone (SUBOXONE) 8-2 mg SUBL SL tablet Place 1 tablet under the tongue See admin instructions. Place one tablet under the tongue twice daily. May place additional one-half tablet under tongue as needed for symptoms of withdrawal. Patient not taking: Reported on 03/15/2022    [provider]  diltiazem (CARDIZEM CD) 180 MG 24 hr capsule Take 1 capsule (180 mg total) by mouth daily. 09/15/20   Russella Dar, NP  FERREX 150 150 MG capsule Take 150  mg by mouth daily. 02/18/22   [provider]  losartan (COZAAR) 50 MG tablet Take 50 mg by mouth at bedtime. 02/18/22   [provider]  senna-docusate (SENOKOT-S) 8.6-50 MG tablet Take 1 tablet by mouth 2 (two) times daily. 09/14/20   Russella Dar, NP  warfarin (COUMADIN) 5 MG tablet TAKE 1.5 - 2 (ONE & ONE-HALF TO TWO) TABLETS BY MOUTH DAILY OR AS DIRECTED 12/27/22   Rollene Rotunda, MD      Allergies    Patient has no known allergies.    Review of Systems   Review of Systems  Constitutional:  Negative for fever.  Cardiovascular:        Chest wall pain    Physical Exam Updated Vital Signs BP (!) 157/90   Pulse 84   Temp 97.6 F (36.4 C)   Resp 15   Ht 1.93 m (6\' 4" )   Wt 127.5 kg   SpO2 97%   BMI 34.20 kg/m  Physical Exam CONSTITUTIONAL: Chronically ill-appearing, anxious HEAD: Normocephalic/atraumatic ENMT: Mucous membranes moist NECK: supple no meningeal signs CV: S1/S2 noted, murmur noted LUNGS: Lungs are clear to auscultation bilaterally, no apparent distress Chest-significant tenderness noted to the right chest.  There is a firm mass noted to palpation.  There is also bruising noted along the right lateral chest.  No crepitus.  See photo ABDOMEN: soft, nontender NEURO: Pt is awake/alert/appropriate, moves all extremitiesx4.  No facial droop.   EXTREMITIES: pulses normal/equal, full ROM Pulses equal in both upper extremities Patient with tenderness and firmness to the right bicep PSYCH: Anxious       ED Results / Procedures / Treatments   Labs (all labs ordered are listed, but only abnormal results are displayed) Labs Reviewed  CBC WITH DIFFERENTIAL/PLATELET - Abnormal; Notable for the following components:      Result Value   WBC 13.6 (*)    Neutro Abs 10.3 (*)    All other components within normal limits  BASIC METABOLIC PANEL - Abnormal; Notable for the following components:   Sodium 134 (*)    Glucose, Bld 112 (*)    BUN 27  (*)    Creatinine, Ser 1.57 (*)    GFR, Estimated 57 (*)    All other components within normal limits  PROTIME-INR - Abnormal; Notable for the following components:   Prothrombin Time 38.8 (*)    INR 3.9 (*)    All other components within normal limits  TYPE AND SCREEN    EKG EKG Interpretation Date/Time:  Wednesday January 08 2023 03:26:58 EDT Ventricular Rate:  83 PR Interval:  159 QRS Duration:  104 QT Interval:  384 QTC Calculation: 452 R Axis:   70  Text Interpretation: Sinus rhythm Ventricular bigeminy Confirmed by Zadie Rhine ([id number]) on 01/08/2023 3:34:02 AM  Radiology CT Chest W Contrast  Result Date: 01/08/2023 CLINICAL DATA:  Patient has a tender "mass" to the right chest wall with tenderness and swelling. There is also some associated bruising. Patient is on warfarin anticoagulation. EXAM: CT CHEST WITH CONTRAST TECHNIQUE: Multidetector CT imaging of the chest was performed during intravenous contrast administration. RADIATION DOSE REDUCTION: This exam was performed according to the departmental dose-optimization program which includes automated exposure control, adjustment of the mA and/or kV according to patient size and/or use of iterative reconstruction technique. CONTRAST:  75mL OMNIPAQUE IOHEXOL 300 MG/ML  SOLN COMPARISON:  None Available. FINDINGS: Cardiovascular: The heart size is normal. No substantial pericardial effusion. Status post aortic valve replacement. No pericardial effusion. Mediastinum/Nodes: No mediastinal lymphadenopathy. There is no hilar lymphadenopathy. The esophagus has normal imaging features. There is no axillary lymphadenopathy. Lungs/Pleura: 4 mm parahilar right lung nodule identified on image 67/3. There is some dependent atelectasis in the right lung base. No focal airspace consolidation. No pleural effusion. Upper Abdomen: Calcified gallstones measure up to about 2.3 cm diameter. Musculoskeletal: No worrisome lytic or sclerotic osseous  abnormality. Asymmetric enlargement of the right pectoralis major muscle is associated with a 5.5 x 5.2 x 4.9 cm masslike heterogeneous intramuscular collection along the lateral margin. Imaging appearance is suggestive of intramuscular hemorrhage with a focal hematoma, especially in light of the clinical history of rapid onset and chronic anticoagulation. Just inferior to this dominant collection is a second 3.7 x 2.8 x 3.2 cm collection, also likely an intramuscular hematoma. Edema/hemorrhage tracks into the region of the right axilla where a complex 7.9 x 5.2 x 5.3 cm collection is compatible with a hematoma. This appears to dissect into the muscles of the upper arm where another 5.4 x 6.6 by 6.1 cm collection is identified. Stranding in the subcutaneous fat of the right chest wall and upper right arm is compatible with edema/hemorrhage. Tiny foci of contrast are seen in the region of the high subpectoral/axillary anatomy (37/2) but these are nonspecific and could represent tiny nodes. There is no overt contrast extravasation on this exam to suggest active or ongoing hemorrhage. IMPRESSION:  1. Multiple complex collections in the right pectoralis major muscle, right axilla, and right upper arm are compatible with hematomas, especially in light of the clinical history of rapid onset and chronic anticoagulation. No definite contrast extravasation to indicate active or ongoing hemorrhage. While imaging features are most suggestive of hematoma/hemorrhage, follow-up is recommended to ensure resolution. 2. 4 mm parahilar right lung nodule. No follow-up needed if patient is low-risk.This recommendation follows the consensus statement: Guidelines for Management of Incidental Pulmonary Nodules Detected on CT Images: From the Fleischner Society 2017; Radiology 2017; 284:228-243. 3. Cholelithiasis. Electronically Signed   By: Kennith Center M.D.   On: 01/08/2023 06:28   DG Chest Portable 1 View  Result Date:  01/08/2023 CLINICAL DATA:  Right-sided chest pain. EXAM: PORTABLE CHEST 1 VIEW COMPARISON:  September 22, 2020 FINDINGS: Multiple sternal wires are noted. The heart size and mediastinal contours are within normal limits. Very mild atelectasis is noted within the right lung base. There is no evidence of an acute infiltrate, pleural effusion or pneumothorax. A chronic third right rib fracture is noted. The visualized skeletal structures are unremarkable. IMPRESSION: 1. Evidence of prior median sternotomy. 2. Very mild right basilar atelectasis. Electronically Signed   By: Aram Candela M.D.   On: 01/08/2023 03:50    Procedures Procedures    Medications Ordered in ED Medications  fentaNYL (SUBLIMAZE) injection 100 mcg (100 mcg Intravenous Given 01/08/23 0509)  iohexol (OMNIPAQUE) 300 MG/ML solution 75 mL (75 mLs Intravenous Contrast Given 01/08/23 0548)  fentaNYL (SUBLIMAZE) injection 100 mcg (100 mcg Intravenous Given 01/08/23 1610)    ED Course/ Medical Decision Making/ A&P Clinical Course as of 01/08/23 0647  Wed Jan 08, 2023  0537 BUN(!): 27 [DW]  0537 Creatinine(!): 1.57 Renal insufficiency [DW]  9604 Patient presents with atraumatic pain and swelling to the right side of his chest.  He also has bruising.  Patient is on Coumadin and concern for supratherapeutic INR which could contribute to bleeding Discussed with radiology, they recommend CT contrast with contrast if possible.  Patient's creatinine is 1.5, therefore he can receive IV contrast [DW]  [phone] Patient stable in the ER.  INR 3.9 which does not need reversal  CT imaging reviewed with patient.  Patient has had multiple hematomas but there is no active extravasation.  There has been no expansion of the swelling while here in the ER.   [DW]  [phone] Patient is otherwise in no acute distress.  At this point he is safe for discharge home. He has tenderness throughout the right bicep, but distal pulses are intact.  No neurodeficits.    [DW]  [phone] Advised to keep arm elevated and rest for the next several days.  We discussed strict return precautions [DW]  [phone] Patient also informed of lung nodule and need for repeat CAT scan in the next month [DW]    Clinical Course User Index [DW] Zadie Rhine, MD                                 Medical Decision Making Amount and/or Complexity of Data Reviewed Labs: ordered. Decision-making details documented in ED Course. Radiology: ordered.  Risk Prescription drug management.           Final Clinical Impression(s) / ED Diagnoses Final diagnoses:  Right arm pain  Hematoma  Lung nodule    Rx / DC Orders ED Discharge Orders     None  Zadie Rhine, MD 01/08/23 [phone]

## 2023-01-08 NOTE — ED Notes (Signed)
Assumed care of pt, found him very uncomfortable in bed, restless.  Pt has swelling to the right arm and chest area, states he does not remember injuring the body.  He is on blood thinners and does have a large bruise on the right side of his chest and under the right arm.  RN saw several injection sites in his left [facility] area, pt admits to one recent drug use.  Swollen area is very tender.

## 2023-01-08 NOTE — ED Notes (Signed)
Pt at CT

## 2023-01-08 NOTE — Discharge Instructions (Signed)
Keep your arm elevated is much as you can. If you have rapidly worsening pain or expansion of the swelling in the arm of the chest please return to the ER  As we discussed, you will need to have a repeat CAT scan in the next month to ensure the swelling is improved as well as to make sure the nodule is resolved

## 2023-01-10 ENCOUNTER — Ambulatory Visit (INDEPENDENT_AMBULATORY_CARE_PROVIDER_SITE_OTHER): Payer: 59 | Admitting: Internal Medicine

## 2023-01-10 ENCOUNTER — Telehealth: Payer: Self-pay | Admitting: Cardiology

## 2023-01-10 DIAGNOSIS — Z952 Presence of prosthetic heart valve: Secondary | ICD-10-CM | POA: Diagnosis not present

## 2023-01-10 DIAGNOSIS — Z5181 Encounter for therapeutic drug level monitoring: Secondary | ICD-10-CM

## 2023-01-10 NOTE — Telephone Encounter (Signed)
Patient's mother is calling due to patient being seen in hospital recently in regards to bruising and right arm pain. She would like to ask Misty Stanley, RN questions about if there would be something else to do for patient to keep him comfortable. Please advise.

## 2023-01-10 NOTE — Telephone Encounter (Signed)
Called and spoke to pt. Please seen anticoag encounter for further documentation concerning INR drawn on 01/08/2023.   Pt complained of the same pain that brought him to the ER on 01/08/2023. Pt stated that the Dr told him he could use a heating pad and to take tylenol. Informed pt to continue the interventions the Dr recommended and to seek medical attention if the pain gets worse or persist.

## 2023-01-10 NOTE — Patient Instructions (Addendum)
Description   Called and spoke to pt and instructed him to hold warfarin today and then continue dose of 2 1/2 tablets daily except 2 tablets on Tuesdays and Fridays. Pt already had appointment set up for 01/16/2023

## 2023-01-13 ENCOUNTER — Telehealth: Payer: Self-pay | Admitting: *Deleted

## 2023-01-13 NOTE — Telephone Encounter (Signed)
Mother Lynden Ang) wants to know if patient taking tumeric will affect patient's blood thinner.  Mother stated patient is still in a lot of pain.

## 2023-01-13 NOTE — Telephone Encounter (Signed)
Spoke to pt's mother.  Advised her not to start pt on Tumeric as it can increase INR.  She verbalized understanding.

## 2023-01-16 ENCOUNTER — Ambulatory Visit: Payer: 59 | Attending: Cardiology | Admitting: *Deleted

## 2023-01-16 DIAGNOSIS — Z5181 Encounter for therapeutic drug level monitoring: Secondary | ICD-10-CM

## 2023-01-16 DIAGNOSIS — I4891 Unspecified atrial fibrillation: Secondary | ICD-10-CM | POA: Diagnosis not present

## 2023-01-16 DIAGNOSIS — I63413 Cerebral infarction due to embolism of bilateral middle cerebral arteries: Secondary | ICD-10-CM | POA: Diagnosis not present

## 2023-01-16 LAB — POCT INR: INR: 2.6 (ref 2.0–3.0)

## 2023-01-16 NOTE — Patient Instructions (Signed)
Continue warfarin 2 1/2 tablets daily except 2 tablets on Tuesdays and Fridays. Recheck in 3 wks

## 2023-01-23 ENCOUNTER — Emergency Department (HOSPITAL_COMMUNITY)
Admission: EM | Admit: 2023-01-23 | Discharge: 2023-01-23 | Disposition: A | Discharge status: Home / Self Care | Payer: 59 | Attending: Emergency Medicine | Admitting: Emergency Medicine

## 2023-01-23 ENCOUNTER — Other Ambulatory Visit: Payer: Self-pay

## 2023-01-23 ENCOUNTER — Telehealth: Payer: Self-pay | Admitting: Internal Medicine

## 2023-01-23 ENCOUNTER — Encounter (HOSPITAL_COMMUNITY): Payer: Self-pay

## 2023-01-23 DIAGNOSIS — I4891 Unspecified atrial fibrillation: Secondary | ICD-10-CM | POA: Insufficient documentation

## 2023-01-23 DIAGNOSIS — R791 Abnormal coagulation profile: Secondary | ICD-10-CM | POA: Insufficient documentation

## 2023-01-23 DIAGNOSIS — M7989 Other specified soft tissue disorders: Secondary | ICD-10-CM | POA: Insufficient documentation

## 2023-01-23 DIAGNOSIS — M79601 Pain in right arm: Secondary | ICD-10-CM | POA: Diagnosis present

## 2023-01-23 DIAGNOSIS — Z7982 Long term (current) use of aspirin: Secondary | ICD-10-CM | POA: Insufficient documentation

## 2023-01-23 DIAGNOSIS — Z7901 Long term (current) use of anticoagulants: Secondary | ICD-10-CM | POA: Diagnosis not present

## 2023-01-23 DIAGNOSIS — D649 Anemia, unspecified: Secondary | ICD-10-CM | POA: Insufficient documentation

## 2023-01-23 LAB — CBC WITH DIFFERENTIAL/PLATELET
Abs Immature Granulocytes: 0.05 10*3/uL (ref 0.00–0.07)
Basophils Absolute: 0 10*3/uL (ref 0.0–0.1)
Basophils Relative: 0 %
Eosinophils Absolute: 0.3 10*3/uL (ref 0.0–0.5)
Eosinophils Relative: 3 %
HCT: 35.1 % — ABNORMAL LOW (ref 39.0–52.0)
Hemoglobin: 11 g/dL — ABNORMAL LOW (ref 13.0–17.0)
Immature Granulocytes: 1 %
Lymphocytes Relative: 17 %
Lymphs Abs: 1.8 10*3/uL (ref 0.7–4.0)
MCH: 29.9 pg (ref 26.0–34.0)
MCHC: 31.3 g/dL (ref 30.0–36.0)
MCV: 95.4 fL (ref 80.0–100.0)
Monocytes Absolute: 0.8 10*3/uL (ref 0.1–1.0)
Monocytes Relative: 8 %
Neutro Abs: 7.3 10*3/uL (ref 1.7–7.7)
Neutrophils Relative %: 71 %
Platelets: 294 10*3/uL (ref 150–400)
RBC: 3.68 MIL/uL — ABNORMAL LOW (ref 4.22–5.81)
RDW: 14.4 % (ref 11.5–15.5)
WBC: 10.2 10*3/uL (ref 4.0–10.5)
nRBC: 0 % (ref 0.0–0.2)

## 2023-01-23 LAB — PROTIME-INR
INR: 7.1 (ref 0.8–1.2)
Prothrombin Time: 61.5 s — ABNORMAL HIGH (ref 11.4–15.2)

## 2023-01-23 MED ORDER — OXYCODONE HCL 5 MG PO TABS
5.0000 mg | ORAL_TABLET | ORAL | 0 refills | Status: DC | PRN
Start: 2023-01-23 — End: 2023-03-02

## 2023-01-23 MED ORDER — OXYCODONE-ACETAMINOPHEN 5-325 MG PO TABS
1.0000 | ORAL_TABLET | Freq: Once | ORAL | Status: AC
  Administered 2023-01-23: 1 via ORAL
  Filled 2023-01-23: qty 1

## 2023-01-23 NOTE — Discharge Instructions (Addendum)
Your INR today was too high.  It was 7.1, and is supposed to be between 2.5 and 3.5.  Please do not take your warfarin today.  Please call the warfarin clinic for instructions regarding when to resume taking your warfarin and at what dose.  You may apply ice to your arm to try to help with pain and swelling.  You may take acetaminophen for pain.  I have ordered a prescription for oxycodone which you can take along with acetaminophen to get better pain relief.  The pain and swelling is from some bleeding into the muscles of your arm.  This is likely to take at least a month to resolve.

## 2023-01-23 NOTE — Telephone Encounter (Signed)
Spoke with Mother.  Reviewed ED not from today.  INR was 7.1.  Told her to have patient hold warfarin x 3 days (Thurs,Fri,Sat).  Sunday night take 1 1/2 tablets (7.5mg ) and come for INR check on Monday 01/27/23.  She verbalized understanding.

## 2023-01-23 NOTE — ED Provider Notes (Signed)
[name] EMERGENCY DEPARTMENT AT [facility] Provider Note   CSN: [id number] Arrival date & time: 01/23/23  0424     History  Chief Complaint  Patient presents with   Arm Pain    Alex [name] is a 42 y.o. male.  The history is provided by the patient.  He has his story of atrial fibrillation, stroke, chronic anticoagulation on warfarin for mechanical aortic valve replacement and was seen in the emergency 2 weeks ago for hematoma of the right side of chest and right arm comes in with increased swelling in his right arm over the last 3-4 days.  He states that his arm is numb.  He states that because of the swelling, he has difficulty straightening his arm out and difficulty bending it completely.  He denies any trauma.  INR was last checked about 1 week ago and was therapeutic at 2.6.   Home Medications Prior to Admission medications   Medication Sig Start Date End Date Taking? Authorizing Provider  amoxicillin (AMOXIL) 500 MG capsule Take 4 tablets (2,000 mg total) 2 hours prior to dental procedures Patient not taking: Reported on 03/15/2022 05/15/21   Chrystie Nose, MD  aspirin EC 81 MG tablet Take 1 tablet (81 mg total) by mouth daily. Swallow whole. 05/15/21   Hilty, Lisette Abu, MD  buprenorphine-naloxone (SUBOXONE) 8-2 mg SUBL SL tablet Place 1 tablet under the tongue See admin instructions. Place one tablet under the tongue twice daily. May place additional one-half tablet under tongue as needed for symptoms of withdrawal. Patient not taking: Reported on 03/15/2022    [provider]  diltiazem (CARDIZEM CD) 180 MG 24 hr capsule Take 1 capsule (180 mg total) by mouth daily. 09/15/20   Russella Dar, NP  FERREX 150 150 MG capsule Take 150 mg by mouth daily. 02/18/22   [provider]  losartan (COZAAR) 50 MG tablet Take 50 mg by mouth at bedtime. 02/18/22   [provider]  senna-docusate (SENOKOT-S) 8.6-50 MG tablet Take 1 tablet by  mouth 2 (two) times daily. 09/14/20   Russella Dar, NP  warfarin (COUMADIN) 5 MG tablet TAKE 1.5 - 2 (ONE & ONE-HALF TO TWO) TABLETS BY MOUTH DAILY OR AS DIRECTED 12/27/22   Rollene Rotunda, MD      Allergies    Patient has no known allergies.    Review of Systems   Review of Systems  All other systems reviewed and are negative.   Physical Exam Updated Vital Signs Pulse 92   Temp 97.8 F (36.6 C)   Resp 18   Ht 6\' 4"  (1.93 m)   Wt 127.5 kg   SpO2 100%   BMI 34.20 kg/m  Physical Exam Vitals and nursing note reviewed.   42 year old male, resting comfortably and in no acute distress. Vital signs are normal. Oxygen saturation is 100%, which is normal. Head is normocephalic and atraumatic. PERRLA, EOMI.  Lungs are clear without rales, wheezes, or rhonchi. Chest has some swelling and healing ecchymosis on the right side.  This is mildly tender. Heart has regular rate and rhythm with prosthetic aortic valve click noted. Abdomen is soft, flat, nontender. Extremities: There is swelling of the right upper and lower arm with resolving ecchymosis noted on the right upper arm.  Compartments are soft.  Radial pulse is strong, capillary refill is prompt. Skin is warm and dry without rash. Neurologic: Mental status is normal, no objective motor or sensory deficits.  ED  Results / Procedures / Treatments   Labs (all labs ordered are listed, but only abnormal results are displayed) Labs Reviewed  CBC WITH DIFFERENTIAL/PLATELET - Abnormal; Notable for the following components:      Result Value   RBC 3.68 (*)    Hemoglobin 11.0 (*)    HCT 35.1 (*)    All other components within normal limits  PROTIME-INR - Abnormal; Notable for the following components:   Prothrombin Time 61.5 (*)    INR 7.1 (*)    All other components within normal limits    Procedures Procedures    Medications Ordered in ED Medications  oxyCODONE-acetaminophen (PERCOCET/ROXICET) 5-325 MG per tablet 1 tablet  (has no administration in time range)    ED Course/ Medical Decision Making/ A&P                                 Medical Decision Making Amount and/or Complexity of Data Reviewed Labs: ordered.  Risk Prescription drug management.   Right arm pain and swelling in patient with known hematoma of that arm.  I suspect there might have been some expansion of the hematoma, but no evidence of compartment syndrome.  I have reviewed his past records and note ED visit on 01/08/2023 at which time CT showed multiple complex collections in the right pectoralis major muscle, right axilla, right upper arm.  With known hematomata present, I do not think there is any value to be getting repeat CT scan.  I am worried that perhaps his INR could have increased leading to recurrent bleeding.  I have ordered an INR and I have also ordered a CBC to evaluate if there has been any drop in hemoglobin.  I have reviewed his laboratory test, and my interpretation is suprapubic.  INR 7.1.  I suspect that he had a muscle tear as cause of his initial hematoma and that he had rebleeding when his INR became supratherapeutic.  Mild anemia is present which is new and likely represents blood loss into his hematoma.  I have advised him to apply ice and to take acetaminophen as needed for pain.  I have ordered a prescription for oxycodone for more severe pain.  I have also advised him to not take his warfarin today.  He needs to contact his warfarin clinic for instructions on this to when to resume his medication and at what dose.  Final Clinical Impression(s) / ED Diagnoses Final diagnoses:  Pain and swelling of right upper extremity  Supratherapeutic INR  Normochromic normocytic anemia    Rx / DC Orders ED Discharge Orders          Ordered    oxyCODONE (ROXICODONE) 5 MG immediate release tablet  Every 4 hours PRN        01/23/23 0737              Dione Booze, MD 01/23/23 [phone]

## 2023-01-23 NOTE — Telephone Encounter (Signed)
Pt c/o medication issue:  1. Name of Medication:  warfarin (COUMADIN) 5 MG tablet  2. How are you currently taking this medication (dosage and times per day)?   3. Are you having a reaction (difficulty breathing--STAT)?   4. What is your medication issue?    Mother states patient has had pain in his upper arm and chest and went to the ED because it did not improve. She states his blood count (maybe INR) was at 7 and he was advised to hold Warfarin. Daughter states they advised to contact the office to see hoe long her should hold it. She also mentions that Oxycodone has been prescribed for the pain.

## 2023-01-23 NOTE — ED Triage Notes (Signed)
Pov from home. Returns for cc of right arm pain. Says it is getting worse.

## 2023-01-27 ENCOUNTER — Ambulatory Visit: Payer: 59 | Attending: Cardiology | Admitting: *Deleted

## 2023-01-27 DIAGNOSIS — I63413 Cerebral infarction due to embolism of bilateral middle cerebral arteries: Secondary | ICD-10-CM | POA: Diagnosis not present

## 2023-01-27 DIAGNOSIS — I4891 Unspecified atrial fibrillation: Secondary | ICD-10-CM

## 2023-01-27 DIAGNOSIS — Z5181 Encounter for therapeutic drug level monitoring: Secondary | ICD-10-CM

## 2023-01-27 LAB — POCT INR: INR: 1.8 — AB (ref 2.0–3.0)

## 2023-01-27 NOTE — Patient Instructions (Signed)
Take warfarin 3 tablets tonight then resume 2 1/2 tablets daily except 2 tablets on Tuesdays and Fridays. Recheck in 1 wk

## 2023-02-03 ENCOUNTER — Encounter: Payer: Self-pay | Admitting: *Deleted

## 2023-02-03 ENCOUNTER — Ambulatory Visit: Payer: 59

## 2023-02-04 ENCOUNTER — Telehealth: Payer: Self-pay | Admitting: Internal Medicine

## 2023-02-04 DIAGNOSIS — I4891 Unspecified atrial fibrillation: Secondary | ICD-10-CM

## 2023-02-04 MED ORDER — WARFARIN SODIUM 5 MG PO TABS: ORAL_TABLET | ORAL | 3 refills | Status: DC

## 2023-02-04 NOTE — Telephone Encounter (Signed)
Forward to coumadin nurse

## 2023-02-04 NOTE — Telephone Encounter (Signed)
*  STAT* If patient is at the pharmacy, call can be transferred to refill team.   1. Which medications need to be refilled? (please list name of each medication and dose if known)  warfarin (COUMADIN) 5 MG tablet  2. Which pharmacy/location (including street and city if local pharmacy) is medication to be sent to? [place], [place] [address]  3. Do they need a 30 day or 90 day supply? 90 day supply

## 2023-02-04 NOTE — Telephone Encounter (Signed)
Refill request for warfarin:  Last INR was 1.8 on 01/27/23 Next INR due 02/06/23 LOV was 03/15/22  Refill approved.

## 2023-03-02 ENCOUNTER — Emergency Department (HOSPITAL_COMMUNITY)
Admission: EM | Admit: 2023-03-02 | Discharge: 2023-03-02 | Disposition: A | Discharge status: Home / Self Care | Payer: 59 | Attending: Emergency Medicine | Admitting: Emergency Medicine

## 2023-03-02 ENCOUNTER — Encounter (HOSPITAL_COMMUNITY): Payer: Self-pay

## 2023-03-02 ENCOUNTER — Emergency Department (HOSPITAL_COMMUNITY): Payer: 59

## 2023-03-02 ENCOUNTER — Other Ambulatory Visit: Payer: Self-pay

## 2023-03-02 DIAGNOSIS — I4891 Unspecified atrial fibrillation: Secondary | ICD-10-CM | POA: Insufficient documentation

## 2023-03-02 DIAGNOSIS — Z7982 Long term (current) use of aspirin: Secondary | ICD-10-CM | POA: Insufficient documentation

## 2023-03-02 DIAGNOSIS — Z7901 Long term (current) use of anticoagulants: Secondary | ICD-10-CM | POA: Insufficient documentation

## 2023-03-02 DIAGNOSIS — N453 Epididymo-orchitis: Secondary | ICD-10-CM | POA: Diagnosis not present

## 2023-03-02 DIAGNOSIS — N50811 Right testicular pain: Secondary | ICD-10-CM | POA: Diagnosis present

## 2023-03-02 LAB — URINALYSIS, W/ REFLEX TO CULTURE (INFECTION SUSPECTED)
Bacteria, UA: NONE SEEN
Bilirubin Urine: NEGATIVE
Glucose, UA: NEGATIVE mg/dL
Ketones, ur: NEGATIVE mg/dL
Nitrite: NEGATIVE
Protein, ur: 100 mg/dL — AB
RBC / HPF: >50 RBC/hpf (ref 0–5)
Specific Gravity, Urine: 1.02 (ref 1.005–1.030)
WBC, UA: >50 WBC/hpf (ref 0–5)
pH: 5 (ref 5.0–8.0)

## 2023-03-02 LAB — CBC WITH DIFFERENTIAL/PLATELET
Abs Immature Granulocytes: 0.04 10*3/uL (ref 0.00–0.07)
Basophils Absolute: 0 10*3/uL (ref 0.0–0.1)
Basophils Relative: 0 %
Eosinophils Absolute: 0.1 10*3/uL (ref 0.0–0.5)
Eosinophils Relative: 1 %
HCT: 45.7 % (ref 39.0–52.0)
Hemoglobin: 14.2 g/dL (ref 13.0–17.0)
Immature Granulocytes: 0 %
Lymphocytes Relative: 23 %
Lymphs Abs: 2.5 10*3/uL (ref 0.7–4.0)
MCH: 28.6 pg (ref 26.0–34.0)
MCHC: 31.1 g/dL (ref 30.0–36.0)
MCV: 92.1 fL (ref 80.0–100.0)
Monocytes Absolute: 0.9 10*3/uL (ref 0.1–1.0)
Monocytes Relative: 9 %
Neutro Abs: 7.3 10*3/uL (ref 1.7–7.7)
Neutrophils Relative %: 67 %
Platelets: 229 10*3/uL (ref 150–400)
RBC: 4.96 MIL/uL (ref 4.22–5.81)
RDW: 14.2 % (ref 11.5–15.5)
WBC: 10.9 10*3/uL — ABNORMAL HIGH (ref 4.0–10.5)
nRBC: 0 % (ref 0.0–0.2)

## 2023-03-02 LAB — BASIC METABOLIC PANEL
Anion gap: 9 (ref 5–15)
BUN: 22 mg/dL — ABNORMAL HIGH (ref 6–20)
CO2: 23 mmol/L (ref 22–32)
Calcium: 9.2 mg/dL (ref 8.9–10.3)
Chloride: 104 mmol/L (ref 98–111)
Creatinine, Ser: 1.68 mg/dL — ABNORMAL HIGH (ref 0.61–1.24)
GFR, Estimated: 52 mL/min — ABNORMAL LOW (ref >60)
Glucose, Bld: 107 mg/dL — ABNORMAL HIGH (ref 70–99)
Potassium: 4 mmol/L (ref 3.5–5.1)
Sodium: 136 mmol/L (ref 135–145)

## 2023-03-02 MED ORDER — CEFTRIAXONE SODIUM 500 MG IJ SOLR
500.0000 mg | Freq: Once | INTRAMUSCULAR | Status: AC
  Administered 2023-03-02: 500 mg via INTRAMUSCULAR
  Filled 2023-03-02: qty 500

## 2023-03-02 MED ORDER — LIDOCAINE HCL (PF) 1 % IJ SOLN
1.0000 mL | Freq: Once | INTRAMUSCULAR | Status: AC
  Administered 2023-03-02: 2.1 mL
  Filled 2023-03-02: qty 5

## 2023-03-02 MED ORDER — DOXYCYCLINE HYCLATE 100 MG PO TABS: 100.0000 mg | ORAL_TABLET | Freq: Two times a day (BID) | ORAL | 0 refills | Status: DC

## 2023-03-02 NOTE — ED Provider Notes (Signed)
[place] EMERGENCY DEPARTMENT AT [facility] Provider Note   CSN: [id number] Arrival date & time: 03/02/23  1516     History  Chief Complaint  Patient presents with   Testicle Pain    Alex [name] is a 42 y.o. male.  With a history of atrial fibrillation on warfarin presenting to the ED for evaluation of right testicle pain.  Symptoms began 2 days ago while he was sitting down at work.  He reports some frequency but denies dysuria or urgency or hematuria.  No history of kidney stones.  He denies any pubic or penile rashes or lesions.  No penile discharge or bleeding.  He is sexually active with both male and male partners.  Typically unprotected.  Last sexually active approximately 2 weeks ago with a male partner.  Denies history of STIs or UTIs.  Denies history of testicular torsion.  He states the right testicle is swollen.  He denies any fevers or chills.  No nausea or vomiting.  No abdominal pain.   Testicle Pain       Home Medications Prior to Admission medications   Medication Sig Start Date End Date Taking? Authorizing Provider  aspirin EC 81 MG tablet Take 1 tablet (81 mg total) by mouth daily. Swallow whole. Patient taking differently: Take 81 mg by mouth every evening. Swallow whole. 05/15/21  Yes Hilty, Lisette Abu, MD  doxycycline (VIBRA-TABS) 100 MG tablet Take 1 tablet (100 mg total) by mouth 2 (two) times daily. 03/02/23  Yes Zhoe Catania, Edsel Petrin, PA-C  FERREX 150 150 MG capsule Take 150 mg by mouth daily. 02/18/22  Yes [provider]  losartan (COZAAR) 50 MG tablet Take 50 mg by mouth every evening. 02/18/22  Yes [provider]  warfarin (COUMADIN) 5 MG tablet TAKE 2 TO 2.5 TABLETS BY MOUTH DAILY OR AS DIRECTED Patient taking differently: Take 10-12.5 mg by mouth See admin instructions. Take 2 tablets (10 mg) in the evenings on Tuesday and Friday. Take 2.5 tablets (12.5mg ) in the evening on all other days. 02/04/23  Yes Rollene Rotunda, MD      Allergies    Patient has no known allergies.    Review of Systems   Review of Systems  Genitourinary:  Positive for testicular pain.  All other systems reviewed and are negative.   Physical Exam Updated Vital Signs BP 136/88   Pulse 86   Temp 98.6 F (37 C) (Oral)   Resp 16   Ht 6\' 4"  (1.93 m)   Wt 113.4 kg   SpO2 97%   BMI 30.43 kg/m  Physical Exam Vitals and nursing note reviewed. Exam conducted with a chaperone present Molli Hazard, NT).  Constitutional:      General: He is not in acute distress.    Appearance: Normal appearance. He is normal weight. He is not ill-appearing.  HENT:     Head: Normocephalic and atraumatic.  Pulmonary:     Effort: Pulmonary effort is normal. No respiratory distress.  Abdominal:     General: Abdomen is flat.  Genitourinary:    Comments: Right hemiscrotum swollen and erythematous.  Significant TTP to right epididymis.  No pubic or penile rashes or lesions.  No penile discharge or bleeding.  No inguinal hernias. Musculoskeletal:        General: Normal range of motion.     Cervical back: Neck supple.  Skin:    General: Skin is warm and dry.  Neurological:     Mental Status: He  is alert and oriented to person, place, and time.  Psychiatric:        Mood and Affect: Mood normal.        Behavior: Behavior normal.     ED Results / Procedures / Treatments   Labs (all labs ordered are listed, but only abnormal results are displayed) Labs Reviewed  BASIC METABOLIC PANEL - Abnormal; Notable for the following components:      Result Value   Glucose, Bld 107 (*)    BUN 22 (*)    Creatinine, Ser 1.68 (*)    GFR, Estimated 52 (*)    All other components within normal limits  CBC WITH DIFFERENTIAL/PLATELET - Abnormal; Notable for the following components:   WBC 10.9 (*)    All other components within normal limits  URINALYSIS, W/ REFLEX TO CULTURE (INFECTION SUSPECTED) - Abnormal; Notable for the following components:    APPearance CLOUDY (*)    Hgb urine dipstick MODERATE (*)    Protein, ur 100 (*)    Leukocytes,Ua MODERATE (*)    Non Squamous Epithelial 6-10 (*)    All other components within normal limits  URINE CULTURE  RPR  HIV ANTIBODY (ROUTINE TESTING W REFLEX)  GC/CHLAMYDIA PROBE AMP ([place]) NOT AT [facility]    EKG None  Radiology US SCROTUM W/DOPPLER  Result Date: 03/02/2023 CLINICAL DATA:  Right-sided testicular pain for two days, initial encounter EXAM: SCROTAL ULTRASOUND DOPPLER ULTRASOUND OF THE TESTICLES TECHNIQUE: Complete ultrasound examination of the testicles, epididymis, and other scrotal structures was performed. Color and spectral Doppler ultrasound were also utilized to evaluate blood flow to the testicles. COMPARISON:  None Available. FINDINGS: Right testicle Measurements: 4.5 x 2.7 x 3.0 cm. No mass or microlithiasis visualized. Left testicle Measurements: 4.3 x 2.4 x 3.5 cm. No mass or microlithiasis visualized. Right epididymis: Increased vascularity is noted within the epididymis. Left epididymis:  Normal in size and appearance. Hydrocele:  Small right-sided hydrocele is noted. Varicocele:  None visualized. Pulsed Doppler interrogation of both testes demonstrates normal low resistance arterial and venous waveforms within the left testicle. Increased vascularity in the right testicle is noted. IMPRESSION: Changes consistent with epididymo-orchitis on the right. Small right hydrocele Electronically Signed   By: Alcide Clever M.D.   On: 03/02/2023 19:44    Procedures Procedures    Medications Ordered in ED Medications  cefTRIAXone (ROCEPHIN) injection 500 mg (500 mg Intramuscular Given 03/02/23 2050)  lidocaine (PF) (XYLOCAINE) 1 % injection 1-2.1 mL (2.1 mLs Other Given 03/02/23 2050)    ED Course/ Medical Decision Making/ A&P                                 Medical Decision Making Amount and/or Complexity of Data Reviewed Labs: ordered. Radiology:  ordered.  Risk Prescription drug management.  This patient presents to the ED for concern of right testicle pain, this involves an extensive number of treatment options, and is a complaint that carries with it a high risk of complications and morbidity. The differential diagnosis of Emergent testicle pain includes but is not limited to Cellulitis, Fournier's gangrene, epididymitis, orchitis, testicular torsion, appendage torsion, trauma, indirect hernia.   My initial workup includes labs, imaging  Additional history obtained from: Nursing notes from this visit.  I ordered, reviewed and interpreted labs which include: STI panel, CBC, BMP, urinalysis.  Urine with moderate leukocytes, greater than 50 white blood cells  I ordered imaging studies  including testicular ultrasound I independently visualized and interpreted imaging which showed right sided epididymo-orchitis I agree with the radiologist interpretation  Afebrile, hemodynamically stable.  42 year old male presenting for evaluation of right-sided testicle pain for a few days.  No urinary complaints.  On exam, right testicle is swollen and erythematous.  No evidence of Fournier's gangrene.  Ultrasound consistent with epididymo-orchitis.  He is sexually active with both male and male partners.  Will treat for potential STI related infection with Rocephin and doxycycline.  He was encouraged to follow-up with his primary care provider in 1 week for reevaluation of symptoms.  He was given return precautions.  Stable at discharge.  At this time there does not appear to be any evidence of an acute emergency medical condition and the patient appears stable for discharge with appropriate outpatient follow up. Diagnosis was discussed with patient who verbalizes understanding of care plan and is agreeable to discharge. I have discussed return precautions with patient who verbalizes understanding. Patient encouraged to follow-up with their PCP within  1 week. All questions answered.  Note: Portions of this report may have been transcribed using voice recognition software. Every effort was made to ensure accuracy; however, inadvertent computerized transcription errors may still be present.         Final Clinical Impression(s) / ED Diagnoses Final diagnoses:  Epididymo-orchitis    Rx / DC Orders ED Discharge Orders          Ordered    doxycycline (VIBRA-TABS) 100 MG tablet  2 times daily        03/02/23 2026              Mora Bellman 03/02/23 2201    Bethann Berkshire, MD 03/04/23 1058

## 2023-03-02 NOTE — Discharge Instructions (Addendum)
You have been seen today for your complaint of right testicle pain. Your imaging showed infection of the right testicle and epididymis. Your discharge medications include doxycycline. This is an antibiotic. You should take it as prescribed. You should take it for the entire duration of the prescription. This may cause an upset stomach. This is normal. You may take this with food. You may also eat yogurt to prevent diarrhea. Follow up with: Your primary care provider in 1 week for reevaluation Please seek immediate medical care if you develop any of the following symptoms: You have a fever. Your pain medicine is not helping. Your pain is getting worse. Your symptoms do not improve within 3 days. At this time there does not appear to be the presence of an emergent medical condition, however there is always the potential for conditions to change. Please read and follow the below instructions.  Do not take your medicine if  develop an itchy rash, swelling in your mouth or lips, or difficulty breathing; call 911 and seek immediate emergency medical attention if this occurs.  You may review your lab tests and imaging results in their entirety on your MyChart account.  Please discuss all results of fully with your primary care provider and other specialist at your follow-up visit.  Note: Portions of this text may have been transcribed using voice recognition software. Every effort was made to ensure accuracy; however, inadvertent computerized transcription errors may still be present.

## 2023-03-02 NOTE — ED Triage Notes (Signed)
RIGHT testicle swelling and discomfort x2 days Pt denies numbness Pt stated it is normal in color   Denies trauma  Stated while sitting at work Denies issues with urination and denies possible STD's

## 2023-03-03 LAB — RPR: RPR Ser Ql: NONREACTIVE

## 2023-03-04 LAB — GC/CHLAMYDIA PROBE AMP (~~LOC~~) NOT AT ARMC
Chlamydia: POSITIVE — AB
Comment: NEGATIVE
Comment: NORMAL
Neisseria Gonorrhea: NEGATIVE

## 2023-03-04 LAB — URINE CULTURE: Culture: NO GROWTH

## 2023-03-04 LAB — HIV ANTIBODY (ROUTINE TESTING W REFLEX): HIV Screen 4th Generation wRfx: NONREACTIVE

## 2023-03-06 ENCOUNTER — Ambulatory Visit: Payer: 59 | Attending: Cardiology | Admitting: *Deleted

## 2023-03-06 DIAGNOSIS — Z5181 Encounter for therapeutic drug level monitoring: Secondary | ICD-10-CM | POA: Diagnosis not present

## 2023-03-06 DIAGNOSIS — I63413 Cerebral infarction due to embolism of bilateral middle cerebral arteries: Secondary | ICD-10-CM | POA: Diagnosis not present

## 2023-03-06 DIAGNOSIS — I4891 Unspecified atrial fibrillation: Secondary | ICD-10-CM | POA: Diagnosis not present

## 2023-03-06 LAB — POCT INR: INR: 4.7 — AB (ref 2.0–3.0)

## 2023-03-06 NOTE — Patient Instructions (Signed)
Hold warfarin tonight, take 1 tablet tomorrow night then resume 2 1/2 tablets daily except 2 tablets on Tuesdays and Fridays.  On Doxycycline 100mg  2 x day thru 03/09/23 Recheck in 1 wk

## 2023-03-11 ENCOUNTER — Ambulatory Visit: Payer: 59 | Attending: Cardiology | Admitting: *Deleted

## 2023-03-11 DIAGNOSIS — I4891 Unspecified atrial fibrillation: Secondary | ICD-10-CM

## 2023-03-11 DIAGNOSIS — I63413 Cerebral infarction due to embolism of bilateral middle cerebral arteries: Secondary | ICD-10-CM | POA: Diagnosis not present

## 2023-03-11 DIAGNOSIS — Z5181 Encounter for therapeutic drug level monitoring: Secondary | ICD-10-CM | POA: Diagnosis not present

## 2023-03-11 LAB — POCT INR: INR: 4 — AB (ref 2.0–3.0)

## 2023-03-11 NOTE — Patient Instructions (Signed)
Hold warfarin tonight then resume 2 1/2 tablets daily except 2 tablets on Tuesdays and Fridays.  Finished doxycycline Recheck in 3 wk

## 2023-04-03 ENCOUNTER — Ambulatory Visit: Payer: 59 | Attending: Cardiology | Admitting: *Deleted

## 2023-04-03 DIAGNOSIS — Z5181 Encounter for therapeutic drug level monitoring: Secondary | ICD-10-CM | POA: Diagnosis not present

## 2023-04-03 DIAGNOSIS — I4891 Unspecified atrial fibrillation: Secondary | ICD-10-CM

## 2023-04-03 DIAGNOSIS — I63413 Cerebral infarction due to embolism of bilateral middle cerebral arteries: Secondary | ICD-10-CM | POA: Diagnosis not present

## 2023-04-03 LAB — POCT INR: INR: 6.2 — AB (ref 2.0–3.0)

## 2023-04-03 NOTE — Patient Instructions (Signed)
 Hold warfarin tonight and tomorrow night then decrease dose to 2 1/2 tablets daily except 2 tablets on Mondays, Wednesdays and Fridays.  Recheck in 1 wk

## 2023-04-10 ENCOUNTER — Ambulatory Visit: Payer: 59 | Attending: Cardiology | Admitting: *Deleted

## 2023-04-10 DIAGNOSIS — I4891 Unspecified atrial fibrillation: Secondary | ICD-10-CM

## 2023-04-10 DIAGNOSIS — Z5181 Encounter for therapeutic drug level monitoring: Secondary | ICD-10-CM

## 2023-04-10 DIAGNOSIS — I63413 Cerebral infarction due to embolism of bilateral middle cerebral arteries: Secondary | ICD-10-CM | POA: Diagnosis not present

## 2023-04-10 LAB — POCT INR: INR: 4.1 — AB (ref 2.0–3.0)

## 2023-04-10 NOTE — Patient Instructions (Signed)
Hold warfarin tonight then decrease dose to 2 tablets daily except 2 1/2 tablets on Tuesdays and Saturdays.  Recheck in 3 wk

## 2023-04-30 ENCOUNTER — Ambulatory Visit: Payer: 59

## 2023-05-22 ENCOUNTER — Ambulatory Visit: Payer: 59 | Attending: Cardiology | Admitting: *Deleted

## 2023-05-22 DIAGNOSIS — I4891 Unspecified atrial fibrillation: Secondary | ICD-10-CM

## 2023-05-22 DIAGNOSIS — I63413 Cerebral infarction due to embolism of bilateral middle cerebral arteries: Secondary | ICD-10-CM

## 2023-05-22 DIAGNOSIS — Z5181 Encounter for therapeutic drug level monitoring: Secondary | ICD-10-CM

## 2023-05-22 LAB — POCT INR: INR: 3.7 — AB (ref 2.0–3.0)

## 2023-05-22 NOTE — Patient Instructions (Signed)
 Take warfarin 1 tablet tonight then resume 2 tablets daily except 2 1/2 tablets on Tuesdays and Saturdays.  Recheck in 4 wk

## 2023-06-18 ENCOUNTER — Ambulatory Visit: Payer: 59 | Attending: Cardiology | Admitting: *Deleted

## 2023-06-18 DIAGNOSIS — I4891 Unspecified atrial fibrillation: Secondary | ICD-10-CM

## 2023-06-18 DIAGNOSIS — I63413 Cerebral infarction due to embolism of bilateral middle cerebral arteries: Secondary | ICD-10-CM

## 2023-06-18 DIAGNOSIS — Z5181 Encounter for therapeutic drug level monitoring: Secondary | ICD-10-CM | POA: Diagnosis not present

## 2023-06-18 LAB — POCT INR: INR: 2 (ref 2.0–3.0)

## 2023-06-18 NOTE — Patient Instructions (Signed)
 Take warfarin 3 tablets tonight and tomorrow night then resume 2 tablets daily except 2 1/2 tablets on Tuesdays and Saturdays.  Recheck in 3 wks

## 2023-07-09 ENCOUNTER — Ambulatory Visit

## 2023-07-13 ENCOUNTER — Other Ambulatory Visit: Payer: Self-pay | Admitting: Cardiology

## 2023-07-13 DIAGNOSIS — I4891 Unspecified atrial fibrillation: Secondary | ICD-10-CM

## 2023-07-16 ENCOUNTER — Ambulatory Visit: Attending: Cardiology | Admitting: *Deleted

## 2023-07-16 DIAGNOSIS — I63413 Cerebral infarction due to embolism of bilateral middle cerebral arteries: Secondary | ICD-10-CM

## 2023-07-16 DIAGNOSIS — I4891 Unspecified atrial fibrillation: Secondary | ICD-10-CM | POA: Diagnosis not present

## 2023-07-16 DIAGNOSIS — Z5181 Encounter for therapeutic drug level monitoring: Secondary | ICD-10-CM | POA: Diagnosis not present

## 2023-07-16 LAB — POCT INR: INR: 2.9 (ref 2.0–3.0)

## 2023-07-16 NOTE — Patient Instructions (Signed)
 Continue warfarin 2 tablets daily except 2 1/2 tablets on Tuesdays and Saturdays.  Recheck in 4 wks

## 2023-08-13 ENCOUNTER — Ambulatory Visit: Attending: Cardiology | Admitting: *Deleted

## 2023-08-13 DIAGNOSIS — I63413 Cerebral infarction due to embolism of bilateral middle cerebral arteries: Secondary | ICD-10-CM | POA: Diagnosis not present

## 2023-08-13 DIAGNOSIS — I4891 Unspecified atrial fibrillation: Secondary | ICD-10-CM | POA: Diagnosis not present

## 2023-08-13 DIAGNOSIS — Z5181 Encounter for therapeutic drug level monitoring: Secondary | ICD-10-CM

## 2023-08-13 LAB — POCT INR: INR: 3.4 — AB (ref 2.0–3.0)

## 2023-08-13 NOTE — Patient Instructions (Signed)
 Continue warfarin 2 tablets daily except 2 1/2 tablets on Tuesdays and Saturdays.  Recheck in 5 wks

## 2023-08-14 ENCOUNTER — Other Ambulatory Visit: Payer: Self-pay | Admitting: Cardiology

## 2023-08-14 DIAGNOSIS — I4891 Unspecified atrial fibrillation: Secondary | ICD-10-CM

## 2023-09-17 ENCOUNTER — Ambulatory Visit: Attending: Cardiology | Admitting: *Deleted

## 2023-09-17 DIAGNOSIS — I63413 Cerebral infarction due to embolism of bilateral middle cerebral arteries: Secondary | ICD-10-CM

## 2023-09-17 DIAGNOSIS — Z5181 Encounter for therapeutic drug level monitoring: Secondary | ICD-10-CM | POA: Diagnosis not present

## 2023-09-17 DIAGNOSIS — I4891 Unspecified atrial fibrillation: Secondary | ICD-10-CM

## 2023-09-17 LAB — POCT INR: INR: 5.1 — AB (ref 2.0–3.0)

## 2023-09-17 NOTE — Progress Notes (Signed)
Please see anticoagulation encounter.

## 2023-09-17 NOTE — Patient Instructions (Signed)
 Hold warfarin tonight, take 1 tablet tomorrow night then resume 2 tablets daily except 2 1/2 tablets on Tuesdays and Saturdays.  Recheck in 2 wks

## 2023-09-30 ENCOUNTER — Other Ambulatory Visit: Payer: Self-pay | Admitting: Cardiology

## 2023-09-30 DIAGNOSIS — I4891 Unspecified atrial fibrillation: Secondary | ICD-10-CM

## 2023-09-30 NOTE — Telephone Encounter (Signed)
 Refill Request.

## 2023-09-30 NOTE — Telephone Encounter (Signed)
 Refill request for warfarin:  Last INR was 5.1 on 09/17/23 Next INR due 10/01/23 LOV was 03/15/22  Refill approved. Pt is past due to see Dr Mona.  Message sent to schedulers to make appt.

## 2023-10-01 ENCOUNTER — Ambulatory Visit: Attending: Cardiology | Admitting: *Deleted

## 2023-10-01 DIAGNOSIS — Z5181 Encounter for therapeutic drug level monitoring: Secondary | ICD-10-CM | POA: Diagnosis not present

## 2023-10-01 DIAGNOSIS — I4891 Unspecified atrial fibrillation: Secondary | ICD-10-CM | POA: Diagnosis not present

## 2023-10-01 DIAGNOSIS — I63413 Cerebral infarction due to embolism of bilateral middle cerebral arteries: Secondary | ICD-10-CM

## 2023-10-01 LAB — POCT INR: INR: 4 — AB (ref 2.0–3.0)

## 2023-10-01 NOTE — Progress Notes (Signed)
Please see anticoagulation encounter.

## 2023-10-01 NOTE — Patient Instructions (Signed)
 Take warfarin 1 tablet tonight then decrease dose to 2 tablets daily Eat salad tonight Recheck in 2 wks

## 2023-10-14 ENCOUNTER — Ambulatory Visit: Attending: Cardiology | Admitting: *Deleted

## 2023-10-14 DIAGNOSIS — Z5181 Encounter for therapeutic drug level monitoring: Secondary | ICD-10-CM | POA: Diagnosis not present

## 2023-10-14 DIAGNOSIS — I4891 Unspecified atrial fibrillation: Secondary | ICD-10-CM | POA: Diagnosis not present

## 2023-10-14 DIAGNOSIS — I63413 Cerebral infarction due to embolism of bilateral middle cerebral arteries: Secondary | ICD-10-CM | POA: Diagnosis not present

## 2023-10-14 LAB — POCT INR: INR: 2.1 (ref 2.0–3.0)

## 2023-10-14 NOTE — Patient Instructions (Signed)
 Increase warfarin to 2 tablets daily except 2 1/2 tablets on Tuesdays Eat salad tonight Recheck in 3 wks

## 2023-10-14 NOTE — Progress Notes (Signed)
Please see anticoagulation encounter.

## 2023-10-21 ENCOUNTER — Ambulatory Visit

## 2023-10-21 ENCOUNTER — Ambulatory Visit: Admitting: Podiatry

## 2023-11-05 ENCOUNTER — Ambulatory Visit: Attending: Internal Medicine | Admitting: *Deleted

## 2023-11-05 DIAGNOSIS — I4891 Unspecified atrial fibrillation: Secondary | ICD-10-CM | POA: Diagnosis not present

## 2023-11-05 DIAGNOSIS — Z5181 Encounter for therapeutic drug level monitoring: Secondary | ICD-10-CM

## 2023-11-05 DIAGNOSIS — I63413 Cerebral infarction due to embolism of bilateral middle cerebral arteries: Secondary | ICD-10-CM | POA: Diagnosis not present

## 2023-11-05 LAB — POCT INR: INR: 2.3 (ref 2.0–3.0)

## 2023-11-05 NOTE — Progress Notes (Signed)
 INR-2.3; Please see anticoagulation encounter

## 2023-11-05 NOTE — Patient Instructions (Signed)
 Take warfarin 3 tablets tonight then resume 2 tablets daily except 2 1/2 tablets on Tuesdays Eat salad tonight Recheck in 4 wks

## 2023-12-03 ENCOUNTER — Ambulatory Visit: Attending: Cardiology

## 2023-12-16 ENCOUNTER — Encounter

## 2023-12-17 ENCOUNTER — Ambulatory Visit: Attending: Cardiology | Admitting: *Deleted

## 2023-12-17 DIAGNOSIS — I4891 Unspecified atrial fibrillation: Secondary | ICD-10-CM | POA: Diagnosis not present

## 2023-12-17 DIAGNOSIS — I63413 Cerebral infarction due to embolism of bilateral middle cerebral arteries: Secondary | ICD-10-CM

## 2023-12-17 DIAGNOSIS — Z5181 Encounter for therapeutic drug level monitoring: Secondary | ICD-10-CM

## 2023-12-17 LAB — POCT INR: INR: 1.1 — AB (ref 2.0–3.0)

## 2023-12-17 NOTE — Patient Instructions (Signed)
 Ran out of warfarin Take warfarin 3 tablets tonight and tomorrow night then resume 2 tablets daily except 2 1/2 tablets on Tuesdays Eat salad tonight Recheck in 2 wks

## 2023-12-17 NOTE — Progress Notes (Signed)
 INR 1.1 Please see anticoagulation encounter

## 2023-12-31 ENCOUNTER — Ambulatory Visit: Attending: Cardiology

## 2024-01-14 ENCOUNTER — Ambulatory Visit: Attending: Cardiology | Admitting: *Deleted

## 2024-01-14 DIAGNOSIS — Z5181 Encounter for therapeutic drug level monitoring: Secondary | ICD-10-CM | POA: Diagnosis not present

## 2024-01-14 DIAGNOSIS — I63413 Cerebral infarction due to embolism of bilateral middle cerebral arteries: Secondary | ICD-10-CM | POA: Diagnosis not present

## 2024-01-14 DIAGNOSIS — I4891 Unspecified atrial fibrillation: Secondary | ICD-10-CM

## 2024-01-14 LAB — POCT INR: INR: 1.1 — AB (ref 2.0–3.0)

## 2024-01-14 NOTE — Patient Instructions (Signed)
 Take warfarin 3 tablets tonight then increase dose to 2 tablets daily except 3 tablets on Tuesdays, Thursdays and Saturdays Recheck in 1 wk

## 2024-01-14 NOTE — Progress Notes (Signed)
 INR 1.1 Please see anticoagulation encounter

## 2024-01-15 ENCOUNTER — Ambulatory Visit
Admission: RE | Admit: 2024-01-15 | Discharge: 2024-01-15 | Disposition: A | Discharge status: Home / Self Care | Attending: Nurse Practitioner | Admitting: Nurse Practitioner

## 2024-01-15 VITALS — BP 118/81 | HR 119 | Temp 98.5°F | Resp 16

## 2024-01-15 DIAGNOSIS — L03113 Cellulitis of right upper limb: Secondary | ICD-10-CM

## 2024-01-15 MED ORDER — CEPHALEXIN 500 MG PO CAPS: 500.0000 mg | ORAL_CAPSULE | Freq: Four times a day (QID) | ORAL | 0 refills | Status: AC

## 2024-01-15 MED ORDER — CEFTRIAXONE SODIUM 1 G IJ SOLR
1.0000 g | Freq: Once | INTRAMUSCULAR | Status: AC
  Administered 2024-01-15: 1 g via INTRAMUSCULAR

## 2024-01-15 NOTE — ED Triage Notes (Signed)
 Pt states right arm swelling since last night.  Worse today.  Pt right elbow area red and swollen ,warm to touch. States he put ice on it last night and took tylenol  at home.

## 2024-01-15 NOTE — ED Provider Notes (Signed)
 [facility]    CSN: [id number] Arrival date & time: 01/15/24  1600      History   Chief Complaint Chief Complaint  Patient presents with   Arm Swelling    HPI Alex [name] is a 43 y.o. male.   Patient presents today with 1 day history of right arm swelling, redness, and pain with flexion.  No recent trauma or abrasion to the area that he knows of.  He has felt some chills today but denies known fevers.  Denies history of similar.  Reports the swelling has gotten worse today.  He has put ice on it and has taken Tylenol  without much improvement.    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   Multi-organ failure with heart failure (HCC)    Aortic valve endocarditis    Pressure injury of skin 07/20/2020   Elevated LFTs    Sepsis (HCC) 07/18/2020   Septic shock (HCC) 07/18/2020   Altered mental status 07/18/2020   Acute renal failure with oliguria    Hypotension    Elevated bilirubin    Thrombocytopenia    Plantar fasciitis, left 07/12/2020   Essential hypertension 01/14/2020   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   Nicotine  dependence, cigarettes, uncomplicated 09/24/2017   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/24/2017   Opioid use disorder 09/24/2017    Past Surgical History:  Procedure Laterality Date   AORTIC/RENAL BYPASS     BENTALL PROCEDURE N/A 08/03/2020   Procedure: BENTALL  PROCEDURE USING ON-X AORTIC VALVE SIZE 23 MM AND PERI-GUARD 4CM X 4CM PERICARDIUM;  Surgeon: Shyrl Linnie KIDD, MD;  Location: [facility] OR;  Service: Open Heart Surgery;  Laterality: N/A;   CARDIAC VALVE SURGERY  2022   IR FLUORO GUIDE CV LINE RIGHT  08/01/2020   IR REMOVAL TUN CV CATH W/O FL  08/29/2020   IR US  GUIDE VASC ACCESS RIGHT  08/01/2020   TEE WITHOUT CARDIOVERSION N/A 08/03/2020   Procedure: TRANSESOPHAGEAL ECHOCARDIOGRAM (TEE);  Surgeon: Shyrl Linnie KIDD, MD;  Location: [facility] OR;  Service: Open Heart Surgery;  Laterality: N/A;       Home Medications    Prior to Admission medications   Medication Sig Start Date End Date Taking? Authorizing Provider  cephALEXin (KEFLEX) 500 MG capsule Take 1 capsule (500 mg total) by mouth 4 (four) times daily for 5 days. 01/15/24 01/20/24 Yes Chandra Harlene DELENA, NP  aspirin  EC 81 MG tablet Take 1 tablet (81 mg total) by mouth daily. Swallow whole. Patient taking differently: Take 81 mg by mouth every evening. Swallow whole. 05/15/21   Hilty, Vinie BROCKS, MD  FERREX 150 150 MG capsule Take 150 mg by mouth daily. 02/18/22   [provider]  losartan (COZAAR) 50 MG tablet Take 50 mg by mouth every evening. 02/18/22   [provider]  warfarin (COUMADIN ) 5 MG tablet TAKE 2 TO  2 & 1/2 (ONE-HALF) TABLETS BY MOUTH ONCE DAILY OR  AS  DIRECTED. 09/30/23   Hilty, Vinie BROCKS, MD    Family History Family History  Problem Relation Age of Onset   Diabetes Mother    Heart failure Father    Pneumonia Father    Kidney Stones Brother    Cancer Maternal Grandmother    Cancer Maternal Grandfather    Cancer Paternal Grandmother     Social History Social History   Tobacco Use   Smoking status: Former    Current packs/day: 1.00    Types: Cigarettes   Smokeless tobacco: Never  Vaping Use   Vaping status: Never Used  Substance Use Topics   Alcohol use: No   Drug use: Not Currently    Types: Marijuana    Comment: denies use 08/24/17      Allergies   Patient has no known allergies.   Review of Systems Review of Systems Per HPI  Physical Exam Triage Vital Signs ED Triage Vitals  Encounter Vitals Group     BP 01/15/24 1614 118/81     Girls Systolic BP Percentile --      Girls Diastolic BP Percentile --      Boys Systolic BP Percentile --      Boys Diastolic BP Percentile --      Pulse Rate 01/15/24 1614 (!) 119     Resp 01/15/24 1614 16     Temp 01/15/24 1614 98.5 F (36.9 C)     Temp Source 01/15/24 1614 Oral     SpO2 01/15/24 1614 95 %     Weight --      Height --      Head Circumference --      Peak Flow --      Pain Score 01/15/24 1613 6     Pain Loc --      Pain Education --      Exclude from Growth Chart --    No data found.  Updated Vital Signs BP 118/81 (BP Location: Right Arm)   Pulse (!) 119   Temp 98.5 F (36.9 C) (Oral)   Resp 16   SpO2 95%   Visual Acuity Right Eye Distance:   Left Eye Distance:   Bilateral Distance:    Right Eye Near:   Left Eye Near:    Bilateral Near:     Physical Exam Vitals and nursing note reviewed.  Constitutional:      General: He is not in acute distress.    Appearance: Normal appearance. He is diaphoretic. He is not toxic-appearing.  HENT:     Head: Normocephalic and atraumatic.     Mouth/Throat:     Mouth: Mucous membranes are moist.     Pharynx: Oropharynx is clear.  Eyes:     General:        Right eye: No discharge.        Left eye: No discharge.     Extraocular Movements: Extraocular movements intact.     Pupils: Pupils are equal, round, and reactive to light.  Cardiovascular:     Rate and Rhythm: Tachycardia present.  Pulmonary:     Effort: Pulmonary effort is normal. No respiratory distress.  Skin:    Capillary Refill: Capillary refill takes less than 2 seconds.         Comments: Blanchable erythematous area noted to right upper extremity on the forearm and right upper arm.  The area is warm to touch and mildly edematous.  No  active drainage, oozing, abrasion appreciated.  Neurological:     Mental Status: He is alert and oriented to person, place, and time.  Psychiatric:        Behavior: Behavior is cooperative.      UC Treatments / Results  Labs (all labs ordered are listed, but only abnormal results are displayed) Labs Reviewed - No data to display  EKG   Radiology No results found.  Procedures Procedures (including critical care time)  Medications Ordered in UC Medications  cefTRIAXone  (ROCEPHIN ) injection 1 g (has no administration in time range)    Initial Impression / Assessment and Plan / UC Course  I have reviewed the triage vital signs and the nursing notes.  Pertinent labs & imaging results that were available during my care of the patient were reviewed by me and considered in my medical decision making (see chart for details).   Patient is mildly tachycardic in triage, otherwise vital signs are stable.  1. Cellulitis of right upper extremity Patient is mildly tachycardic and diaphoretic appearing and endorses chills, concern for febrile illness Will treat for cellulitis with ceftriaxone  1 g today in urgent care, start oral Keflex additionally Strict ER precautions discussed with patient if symptoms worsen in any way Work excuse provided  The patient was given the opportunity to ask questions.  All questions answered to their satisfaction.  The patient is in agreement to this plan.   Final Clinical Impressions(s) / UC Diagnoses   Final diagnoses:  Cellulitis of right upper extremity     Discharge Instructions      I am concerned you have a skin infection in your upper right arm.  We have given you an injection of an antibiotic today.  Please start taking the oral Keflex additionally to help treat infection.  You can take Tylenol  or ibuprofen  as needed for pain.  Keep your arm up and elevated and apply ice to the area as needed.  Seek care in the emergency room if symptoms  worsen despite treatment.    ED Prescriptions     Medication Sig Dispense Auth. Provider   cephALEXin (KEFLEX) 500 MG capsule Take 1 capsule (500 mg total) by mouth 4 (four) times daily for 5 days. 20 capsule Chandra Harlene LABOR, NP      PDMP not reviewed this encounter.   Chandra Harlene LABOR, NP 01/15/24 [phone]

## 2024-01-15 NOTE — Discharge Instructions (Signed)
 I am concerned you have a skin infection in your upper right arm.  We have given you an injection of an antibiotic today.  Please start taking the oral Keflex additionally to help treat infection.  You can take Tylenol  or ibuprofen  as needed for pain.  Keep your arm up and elevated and apply ice to the area as needed.  Seek care in the emergency room if symptoms worsen despite treatment.

## 2024-01-21 ENCOUNTER — Ambulatory Visit: Attending: Cardiology

## 2024-02-10 ENCOUNTER — Other Ambulatory Visit: Payer: Self-pay | Admitting: Internal Medicine

## 2024-02-10 DIAGNOSIS — I4891 Unspecified atrial fibrillation: Secondary | ICD-10-CM

## 2024-02-10 NOTE — Telephone Encounter (Signed)
 Refill request for warfarin:  Last INR was 1.1 on 01/14/24 Next INR due 01/21/24 (past due)  Refill approved x 1 Message to pt to make INR appt.
